# Patient Record
Sex: Male | Born: 1975 | Race: Black or African American | Hispanic: No | Marital: Single | Smoking: Never smoker
Health system: Southern US, Community
[De-identification: ages and names within clinical notes are randomized; demographics above are authoritative.]

## PROBLEM LIST (undated history)

## (undated) DIAGNOSIS — Z8711 Personal history of peptic ulcer disease: Secondary | ICD-10-CM

## (undated) DIAGNOSIS — M009 Pyogenic arthritis, unspecified: Secondary | ICD-10-CM

## (undated) DIAGNOSIS — G4730 Sleep apnea, unspecified: Secondary | ICD-10-CM

## (undated) DIAGNOSIS — Z8719 Personal history of other diseases of the digestive system: Secondary | ICD-10-CM

## (undated) DIAGNOSIS — Z9289 Personal history of other medical treatment: Secondary | ICD-10-CM

## (undated) DIAGNOSIS — F99 Mental disorder, not otherwise specified: Secondary | ICD-10-CM

## (undated) DIAGNOSIS — R0602 Shortness of breath: Secondary | ICD-10-CM

## (undated) DIAGNOSIS — F419 Anxiety disorder, unspecified: Secondary | ICD-10-CM

## (undated) DIAGNOSIS — F32A Depression, unspecified: Secondary | ICD-10-CM

## (undated) DIAGNOSIS — Z8614 Personal history of Methicillin resistant Staphylococcus aureus infection: Secondary | ICD-10-CM

## (undated) DIAGNOSIS — K5900 Constipation, unspecified: Secondary | ICD-10-CM

## (undated) DIAGNOSIS — K219 Gastro-esophageal reflux disease without esophagitis: Secondary | ICD-10-CM

## (undated) DIAGNOSIS — E669 Obesity, unspecified: Secondary | ICD-10-CM

## (undated) DIAGNOSIS — F329 Major depressive disorder, single episode, unspecified: Secondary | ICD-10-CM

## (undated) DIAGNOSIS — G629 Polyneuropathy, unspecified: Secondary | ICD-10-CM

## (undated) DIAGNOSIS — N189 Chronic kidney disease, unspecified: Secondary | ICD-10-CM

## (undated) HISTORY — PX: EYE SURGERY: SHX253

## (undated) HISTORY — PX: WISDOM TOOTH EXTRACTION: SHX21

## (undated) HISTORY — PX: TOE SURGERY: SHX1073

---

## 1998-05-15 ENCOUNTER — Encounter: Admission: RE | Admit: 1998-05-15 | Discharge: 1998-08-13 | Payer: Self-pay | Admitting: Nephrology

## 1999-06-19 HISTORY — PX: FOOT FRACTURE SURGERY: SHX645

## 2000-04-14 ENCOUNTER — Emergency Department (HOSPITAL_COMMUNITY): Admission: EM | Admit: 2000-04-14 | Discharge: 2000-04-14 | Payer: Self-pay | Admitting: Internal Medicine

## 2000-04-15 ENCOUNTER — Encounter: Payer: Self-pay | Admitting: Nephrology

## 2000-04-15 ENCOUNTER — Ambulatory Visit (HOSPITAL_COMMUNITY): Admission: RE | Admit: 2000-04-15 | Discharge: 2000-04-15 | Payer: Self-pay | Admitting: Nephrology

## 2001-09-02 ENCOUNTER — Emergency Department (HOSPITAL_COMMUNITY): Admission: EM | Admit: 2001-09-02 | Discharge: 2001-09-02 | Payer: Self-pay | Admitting: Emergency Medicine

## 2003-05-28 ENCOUNTER — Ambulatory Visit (HOSPITAL_COMMUNITY): Admission: RE | Admit: 2003-05-28 | Discharge: 2003-05-28 | Payer: Self-pay | Admitting: Nephrology

## 2003-05-28 ENCOUNTER — Encounter: Payer: Self-pay | Admitting: Nephrology

## 2003-05-31 ENCOUNTER — Ambulatory Visit (HOSPITAL_COMMUNITY): Admission: RE | Admit: 2003-05-31 | Discharge: 2003-05-31 | Payer: Self-pay | Admitting: Nephrology

## 2003-05-31 ENCOUNTER — Encounter: Payer: Self-pay | Admitting: Nephrology

## 2003-06-08 ENCOUNTER — Emergency Department (HOSPITAL_COMMUNITY): Admission: EM | Admit: 2003-06-08 | Discharge: 2003-06-08 | Payer: Self-pay | Admitting: Emergency Medicine

## 2004-10-02 ENCOUNTER — Emergency Department (HOSPITAL_COMMUNITY): Admission: EM | Admit: 2004-10-02 | Discharge: 2004-10-02 | Payer: Self-pay | Admitting: Family Medicine

## 2004-12-14 ENCOUNTER — Encounter: Admission: RE | Admit: 2004-12-14 | Discharge: 2004-12-14 | Payer: Self-pay | Admitting: Nephrology

## 2005-02-20 ENCOUNTER — Emergency Department (HOSPITAL_COMMUNITY): Admission: EM | Admit: 2005-02-20 | Discharge: 2005-02-20 | Payer: Self-pay | Admitting: Emergency Medicine

## 2005-06-15 ENCOUNTER — Emergency Department (HOSPITAL_COMMUNITY): Admission: EM | Admit: 2005-06-15 | Discharge: 2005-06-15 | Payer: Self-pay | Admitting: Family Medicine

## 2005-11-12 ENCOUNTER — Emergency Department (HOSPITAL_COMMUNITY): Admission: EM | Admit: 2005-11-12 | Discharge: 2005-11-12 | Payer: Self-pay | Admitting: Family Medicine

## 2006-01-03 ENCOUNTER — Emergency Department (HOSPITAL_COMMUNITY): Admission: EM | Admit: 2006-01-03 | Discharge: 2006-01-03 | Payer: Self-pay | Admitting: Emergency Medicine

## 2006-01-08 ENCOUNTER — Emergency Department (HOSPITAL_COMMUNITY): Admission: EM | Admit: 2006-01-08 | Discharge: 2006-01-08 | Payer: Self-pay | Admitting: Emergency Medicine

## 2006-04-01 ENCOUNTER — Emergency Department (HOSPITAL_COMMUNITY): Admission: EM | Admit: 2006-04-01 | Discharge: 2006-04-01 | Payer: Self-pay | Admitting: Family Medicine

## 2008-04-16 ENCOUNTER — Observation Stay (HOSPITAL_COMMUNITY): Admission: AD | Admit: 2008-04-16 | Discharge: 2008-04-18 | Payer: Self-pay | Admitting: Nephrology

## 2010-02-23 ENCOUNTER — Ambulatory Visit: Payer: Self-pay | Admitting: Pulmonary Disease

## 2010-02-23 DIAGNOSIS — G4730 Sleep apnea, unspecified: Secondary | ICD-10-CM | POA: Insufficient documentation

## 2010-02-23 DIAGNOSIS — Z9189 Other specified personal risk factors, not elsewhere classified: Secondary | ICD-10-CM | POA: Insufficient documentation

## 2010-02-23 DIAGNOSIS — E1129 Type 2 diabetes mellitus with other diabetic kidney complication: Secondary | ICD-10-CM

## 2010-02-23 DIAGNOSIS — J309 Allergic rhinitis, unspecified: Secondary | ICD-10-CM | POA: Insufficient documentation

## 2010-02-23 DIAGNOSIS — IMO0002 Reserved for concepts with insufficient information to code with codable children: Secondary | ICD-10-CM | POA: Insufficient documentation

## 2010-02-23 DIAGNOSIS — E1165 Type 2 diabetes mellitus with hyperglycemia: Secondary | ICD-10-CM

## 2010-03-24 ENCOUNTER — Encounter: Payer: Self-pay | Admitting: Pulmonary Disease

## 2010-03-24 ENCOUNTER — Ambulatory Visit (HOSPITAL_BASED_OUTPATIENT_CLINIC_OR_DEPARTMENT_OTHER): Admission: RE | Admit: 2010-03-24 | Discharge: 2010-03-24 | Payer: Self-pay | Admitting: Pulmonary Disease

## 2010-03-27 ENCOUNTER — Ambulatory Visit: Payer: Self-pay | Admitting: Pulmonary Disease

## 2010-04-02 ENCOUNTER — Encounter: Payer: Self-pay | Admitting: Pulmonary Disease

## 2010-05-18 ENCOUNTER — Encounter: Payer: Self-pay | Admitting: Pulmonary Disease

## 2010-07-03 ENCOUNTER — Ambulatory Visit: Payer: Self-pay | Admitting: Pulmonary Disease

## 2010-08-04 ENCOUNTER — Ambulatory Visit (HOSPITAL_BASED_OUTPATIENT_CLINIC_OR_DEPARTMENT_OTHER)
Admission: RE | Admit: 2010-08-04 | Discharge: 2010-08-04 | Payer: Self-pay | Source: Home / Self Care | Admitting: Pulmonary Disease

## 2010-08-04 ENCOUNTER — Encounter: Payer: Self-pay | Admitting: Pulmonary Disease

## 2010-08-19 ENCOUNTER — Ambulatory Visit: Payer: Self-pay | Admitting: Pulmonary Disease

## 2010-08-19 DIAGNOSIS — G4733 Obstructive sleep apnea (adult) (pediatric): Secondary | ICD-10-CM | POA: Insufficient documentation

## 2010-11-19 NOTE — Assessment & Plan Note (Signed)
Summary: rov CPAP usage//jwr   Visit Type:  Follow-up Copy to:  pcp Primary Adrian Khan/Referring Adrian Khan:  Dr. Jeri Khan  CC:  Pt states has not been able to use CPAP due to multiple reasons.  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 Epworth Sleepiness Score 1, but I suspect he is underreporting. He underwent PSG @ age 35 & was told that he had obstructive sleep apnea , did not like CPAP. He has gained a lot of weight since to 385 lbs with BMI 55. Bedtime is not fixed, 1100- 0300, sleeps on his stomach, 4-5 awakenings, wakes up sometimes close to noon with headaches.  There is no history suggestive of cataplexy, sleep paralysis or parasomnias  Mother describes loud snoring & has witnessed apneas.  July 03, 2010 5:46 PM  PSg showed moderate obstructive sleep apnea with AHI 10/h but RDI  21/h. Poor compliance with autoCPAP 5  - 12.  He c/o bloating, sleeps on his stomach, unable to turn due to hose. [place]  Preventive Screening-Counseling & Management  Alcohol-Tobacco     Smoking Status: never  Current Medications (verified): 1)  Januvia .... Take 1 Tablet By Mouth Two Times A Day  Allergies (verified): 1)  ! Sulfa  Past History:  Past Medical History: Last updated: 02/23/2010 DIABETES, TYPE 2 (ICD-250.00) SLEEP APNEA (ICD-780.57) ALLERGIC RHINITIS (ICD-477.9)    Social History: Last updated: 02/23/2010 Marital Status: Single, lives with mother, grandmother, and aunt Children: No Occupation: disabled  Patient never smoked.  ETOH-rare  Review of Systems  The patient denies anorexia, fever, weight loss, weight gain, vision loss, decreased hearing, hoarseness, chest pain, syncope, dyspnea on exertion, peripheral edema, prolonged cough, headaches, hemoptysis, abdominal pain, melena, hematochezia, severe  indigestion/heartburn, hematuria, muscle weakness, suspicious skin lesions, difficulty walking, depression, unusual weight change, abnormal bleeding, enlarged lymph nodes, and angioedema.    Vital Signs:  Patient profile:   35 year old male Height:      70 inches Weight:      362.13 pounds BMI:     52.15 O2 Sat:      99 % on Room air Temp:     98.8 degrees F oral Pulse rate:   118 / minute BP sitting:   110 / 80  (left arm) Cuff size:   large  Vitals Entered By: Adrian Khan CMA (July 03, 2010 4:36 PM)  O2 Flow:  Room air CC: Pt states has not been able to use CPAP due to multiple reasons Comments Medications reviewed with patient Verified contact number and pharmacy with patient Adrian Khan [facility]  July 03, 2010 4:36 PM    Physical Exam  Additional Exam:  wt 362 July 03, 2010 Gen. Pleasant, obese, in no distress, normal affect ENT - no lesions, no post nasal drip, class 2 airway Neck: No JVD, no thyromegaly, no carotid bruits Lungs: no use of accessory muscles, no dullness to percussion, clear without rales or rhonchi  Cardiovascular: Rhythm regular, heart sounds  normal, no murmurs or gallops, no peripheral edema Musculoskeletal: No deformities, no cyanosis or clubbing      Impression & Recommendations:  Problem # 1:  SLEEP APNEA (ICD-780.57) decrease CPAP to 8cm & rechk download CPAP titration so we can trouble shoot problems in the lab Attempt to get him a holder for the CPPA hose  - so he can turn in bed.  Orders: DME Referral (DME) Sleep Disorder Referral (Sleep Disorder) Est. Patient Level IV (82956) Prescription Created Electronically [phone])  Problem # 2:  ALLERGIC RHINITIS (ICD-477.9)  His updated medication list for this problem includes:    Zyrtec Hives Relief 10 Mg Tabs (Cetirizine hcl) ..... Once daily  Orders: Est. Patient Level IV (65784) Prescription Created Electronically [phone])  Medications Added to Medication List This  Visit: 1)  Januvia  .... Take 1 tablet by mouth two times a day 2)  Zyrtec Hives Relief 10 Mg Tabs (Cetirizine hcl) .... Once daily  Patient Instructions: 1)  Copy sent to: Dr Bascom Levels 2)  Please schedule a follow-up appointment in 3 months. 3)  CPAP titration study 4)  We will decrease pressure on CPAP machine 5)  Trial of Allergy pill  Prescriptions: ZYRTEC HIVES RELIEF 10 MG TABS (CETIRIZINE HCL) once daily  #30 x 2   Entered and Authorized by:   Adrian Locket Vassie Loll MD   Signed by:   Adrian Locket Vassie Loll MD on 07/03/2010   Method used:   Print then Give to Patient   RxID:   [id number]    Not Administered:    Pneumonia Vaccine not given due to: declined    Influenza Vaccine not given due to: declined

## 2010-11-19 NOTE — Assessment & Plan Note (Signed)
Summary: discuss cpap titration report/ms   Visit Type:  Follow-up Copy to:  pcp Primary Provider/Referring Provider:  Dr. Jeri Cos  CC:  pt here to discuss cpap titration study. pt states the other night he woke up from a nights rest and his whole body was shaking after using cpap.  History of Present Illness: the pt comes in today for f/u of his recent cpap titration study.  He has been having issues with airgulping and bloating, and the study was scheduled to help with troubleshooting.  He was changed to a medium quattro full face mask, and thought this fit much better.  His apnea was controlled from a pressure of 7 to 11cm, but he had a significant reduction in snoring on 11cm (he actually was overtitrated to 14cm).  Current Medications (verified): 1)  Januvia 100 Mg Tabs (Sitagliptin Phosphate) .... One Tablet Two Times A Day  Allergies (verified): 1)  ! Sulfa  Review of Systems       The patient complains of shortness of breath with activity, shortness of breath at rest, productive cough, nasal congestion/difficulty breathing through nose, hand/feet swelling, and joint stiffness or pain.  The patient denies non-productive cough, coughing up blood, chest pain, irregular heartbeats, acid heartburn, indigestion, loss of appetite, weight change, abdominal pain, difficulty swallowing, sore throat, tooth/dental problems, headaches, sneezing, itching, ear ache, anxiety, depression, rash, change in color of mucus, and fever.    Vital Signs:  Patient profile:   35 year old male Height:      70 inches Weight:      368 pounds BMI:     52.99 O2 Sat:      99 % on Room air Temp:     98.1 degrees F oral Pulse rate:   104 / minute BP sitting:   140 / 80  (left arm) Cuff size:   large  Vitals Entered By: Carver Fila (August 19, 2010 2:46 PM)  O2 Flow:  Room air CC: pt here to discuss cpap titration study. pt states the other night he woke up from a nights rest and his whole body  was shaking after using cpap Comments meds and allergies updated Phone number updated Carver Fila  August 19, 2010 2:47 PM    Physical Exam  General:  morbidly obese male in nad Nose:  no skin breakdown or pressure necrosis from cpap mask Extremities:  edema noted, but no cyanosis  Neurologic:  alert and oriented, does not appear overly sleepy, moves all 4.   Impression & Recommendations:  Problem # 1:  OBSTRUCTIVE SLEEP APNEA (ICD-327.23) the pt had titration study which shows an optimal pressure anywhere btw 7-11cm.  The latter controlled snoring better during the night.  Will have his current machine set on 11cm initially, but I think he has room to turn down if this is still causing airgulping and bloating.  He also did better with the mask at the sleep center, so will change to that one.  I have also encouraged him to work on weight loss.    Medications Added to Medication List This Visit: 1)  Januvia 100 Mg Tabs (Sitagliptin phosphate) .... One tablet two times a day  Other Orders: Est. Patient Level III (16109) DME Referral (DME)  Patient Instructions: 1)  will change mask to the one you used in the sleep center. 2)  will set pressure on cpap machine to fixed level of 11cm.  Please call in the next 1-2 weeks and let  us know how things are going 3)  work on weight loss 4)  will send a note to Dr. Vassie Loll and he will arrange followup with you.

## 2010-11-19 NOTE — Assessment & Plan Note (Signed)
Summary: sleep apnea/ mbw   Visit Type:  Initial Consult Copy to:  pcp Primary Provider/Referring Provider:  Dr. Jeri Cos  CC:  Pt here for sleep consult.  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 Epworth Sleepiness Score 1, but I suspect he is underreporting. He underwent PSG @ age 35 & was told that he had obstructive sleep apnea , did not like CPAP. He has gained a lot of weight since to 385 lbs with BMI 55. Bedtime is not fixed, 1100- 0300, sleeps on his stomach, 4-5 awakenings, wakes up sometimes close to noon with headaches.  There is no history suggestive of cataplexy, sleep paralysis or parasomnias  Mother describes loud snoring & has witnessed apneas.   Preventive Screening-Counseling & Management  Alcohol-Tobacco     Smoking Status: never   History of Present Illness: Headache, dizzy, lack of sleep, snoring  What time do you typically go to bed?(between what hours): 11pm to 3am  How long does it take you to fall asleep? instantly  How many times during the night do you wake up? 4 to 5  What time do you get out of bed to start your day? 11am-12noon  Do you drive or operate heavy machinery in your occupation? n/a  How much has your weight changed (up or down) over the past two years? (in pounds): down (unknown)  Have you ever had a sleep study before?  If yes,when and where: Yes  Do you currently use CPAP ? If so , at what pressure? no, [facility]) lost same  Do you wear oxygen at any time? If yes, how many liters per minute? no Allergies (verified): 1)  ! Sulfa  Past History:  Past Medical History: DIABETES, TYPE 2 (ICD-250.00) SLEEP APNEA (ICD-780.57) ALLERGIC RHINITIS (ICD-477.9)    Past Surgical History: WISDOM TEETH EXTRACTION, HX OF (ICD-V15.9)  Family History: Allergies Family History Asthma-mother Family History MI/Heart Attack-both  grandmothers Clotting disorders-mother Cancer-mother, maternal uncle and grandfather  Social History: Marital Status: Single, lives with mother, grandmother, and aunt Children: No Occupation: disabled  Patient never smoked.  ETOH-rareSmoking Status:  never  Review of Systems       The patient complains of shortness of breath with activity, acid heartburn, hand/feet swelling, and joint stiffness or pain.  The patient denies shortness of breath at rest, productive cough, non-productive cough, coughing up blood, chest pain, irregular heartbeats, indigestion, loss of appetite, weight change, abdominal pain, difficulty swallowing, sore throat, tooth/dental problems, headaches, nasal congestion/difficulty breathing through nose, sneezing, itching, ear ache, anxiety, depression, rash, change in color of mucus, and fever.    Vital Signs:  Patient profile:   35 year old male Height:      70 inches Weight:      385 pounds BMI:     55.44 O2 Sat:      97 % on Room air Temp:     98.6 degrees F oral Pulse rate:   112 / minute BP sitting:   124 / 78  (right arm) Cuff size:   large lower arm   Vitals Entered By: [name] CMA (Feb 23, 2010 2:16 PM)  O2 Flow:  Room air CC: Pt here for sleep consult Comments Medications reviewed with patient Verified contact number and pharmacy with patient [name] CMA  Feb 23, 2010 2:17 PM    Physical Exam  Additional Exam:  Gen. Pleasant, well-nourished, in  no distress, normal affect ENT - no lesions, no post nasal drip, class 2 airway Neck: No JVD, no thyromegaly, no carotid bruits Lungs: no use of accessory muscles, no dullness to percussion, clear without rales or rhonchi  Cardiovascular: Rhythm regular, heart sounds  normal, no murmurs or gallops, no peripheral edema Abdomen: soft and non-tender, no hepatosplenomegaly, BS normal. Musculoskeletal: No deformities, no cyanosis or clubbing Neuro:  alert, non focal     Impression &  Recommendations:  Problem # 1:  SLEEP APNEA (ICD-780.57) Given excessive daytime fatigue, morbid obesity & narrow pharyngeal exam, obstructive sleep apnea is very likley & a split study will be scheduled. The pathophysiology of obstructive sleep apnea, it's cardiovascular consequences and modes of treatment including CPAP were discussed with the patient in great detail.  Orders: Consultation Level III (16109) Sleep Disorder Referral (Sleep Disorder)  Patient Instructions: 1)  Copy sent to: Dr Bascom Levels 2)  Please schedule a follow-up appointment in 2 weeks after sleep study     Appended Document: Orders Update let him know moderate sleep apnea, RDI 21/h >> will start auto CPAP   Clinical Lists Changes  Orders: Added new Referral order of DME Referral (DME) - Signed      Appended Document: sleep apnea/ mbw pt informed.

## 2010-12-16 ENCOUNTER — Other Ambulatory Visit: Payer: Self-pay | Admitting: Orthopedic Surgery

## 2010-12-16 ENCOUNTER — Ambulatory Visit
Admission: RE | Admit: 2010-12-16 | Discharge: 2010-12-16 | Disposition: A | Discharge status: Home / Self Care | Payer: MEDICARE | Source: Ambulatory Visit | Attending: Orthopedic Surgery | Admitting: Orthopedic Surgery

## 2010-12-16 DIAGNOSIS — E11621 Type 2 diabetes mellitus with foot ulcer: Secondary | ICD-10-CM

## 2011-02-04 ENCOUNTER — Encounter (HOSPITAL_COMMUNITY)
Admission: RE | Admit: 2011-02-04 | Discharge: 2011-02-04 | Disposition: A | Discharge status: Home / Self Care | Payer: PRIVATE HEALTH INSURANCE | Source: Ambulatory Visit | Attending: Orthopedic Surgery | Admitting: Orthopedic Surgery

## 2011-02-04 LAB — CBC
HCT: 39.7 % (ref 39.0–52.0)
MCHC: 33.8 g/dL (ref 30.0–36.0)
Platelets: 289 10*3/uL (ref 150–400)
RDW: 12.3 % (ref 11.5–15.5)

## 2011-02-04 LAB — URINE MICROSCOPIC-ADD ON

## 2011-02-04 LAB — URINALYSIS, ROUTINE W REFLEX MICROSCOPIC
Bilirubin Urine: NEGATIVE
Ketones, ur: NEGATIVE mg/dL
Nitrite: NEGATIVE
Urobilinogen, UA: 1 mg/dL (ref 0.0–1.0)

## 2011-02-04 LAB — SURGICAL PCR SCREEN
MRSA, PCR: NEGATIVE
Staphylococcus aureus: NEGATIVE

## 2011-02-04 LAB — BASIC METABOLIC PANEL
BUN: 6 mg/dL (ref 6–23)
Calcium: 8.8 mg/dL (ref 8.4–10.5)
GFR calc non Af Amer: >60 mL/min (ref >60)
Glucose, Bld: 149 mg/dL — ABNORMAL HIGH (ref 70–99)
Sodium: 139 mEq/L (ref 135–145)

## 2011-02-05 ENCOUNTER — Observation Stay (HOSPITAL_COMMUNITY)
Admission: RE | Admit: 2011-02-05 | Discharge: 2011-02-06 | Disposition: A | Discharge status: Home / Self Care | Payer: PRIVATE HEALTH INSURANCE | Source: Ambulatory Visit | Attending: Orthopedic Surgery | Admitting: Orthopedic Surgery

## 2011-02-05 ENCOUNTER — Other Ambulatory Visit: Payer: Self-pay | Admitting: Orthopedic Surgery

## 2011-02-05 DIAGNOSIS — G4733 Obstructive sleep apnea (adult) (pediatric): Secondary | ICD-10-CM | POA: Insufficient documentation

## 2011-02-05 DIAGNOSIS — I96 Gangrene, not elsewhere classified: Secondary | ICD-10-CM | POA: Insufficient documentation

## 2011-02-05 DIAGNOSIS — L97509 Non-pressure chronic ulcer of other part of unspecified foot with unspecified severity: Secondary | ICD-10-CM | POA: Insufficient documentation

## 2011-02-05 DIAGNOSIS — E1159 Type 2 diabetes mellitus with other circulatory complications: Principal | ICD-10-CM | POA: Insufficient documentation

## 2011-02-05 LAB — GLUCOSE, CAPILLARY
Glucose-Capillary: 212 mg/dL — ABNORMAL HIGH (ref 70–99)
Glucose-Capillary: 242 mg/dL — ABNORMAL HIGH (ref 70–99)
Glucose-Capillary: 141 mg/dL — ABNORMAL HIGH (ref 70–99)

## 2011-02-06 LAB — GLUCOSE, CAPILLARY

## 2011-02-08 LAB — GLUCOSE, CAPILLARY: Glucose-Capillary: 141 mg/dL — ABNORMAL HIGH (ref 70–99)

## 2011-02-10 LAB — ANAEROBIC CULTURE: Gram Stain: NONE SEEN

## 2011-02-11 LAB — TISSUE CULTURE: Gram Stain: NONE SEEN

## 2011-03-02 NOTE — Consult Note (Signed)
NAMEAXLE, PARFAIT NO.:  [id number]   MEDICAL RECORD NO.:  [id number]          PATIENT TYPE:  INP   LOCATION:  6737                         FACILITY:  [facility]   PHYSICIAN:  Antonietta Breach, M.D.  DATE OF BIRTH:  08-24-1976   DATE OF CONSULTATION:  04/17/2008  DATE OF DISCHARGE:                                 CONSULTATION   REQUESTING PHYSICIAN:  Jarome Matin, M.D.   REASON FOR CONSULTATION:  Anxiety.   HISTORY OF PRESENT ILLNESS:  Mr. Forero is a 35 year old male admitted  to the [facility] on April 16, 2008, with an infected foot.  The  patient's foot requires further evaluation and treatment and likely  surgery.  However, the patient has a severe fear of needles and will not  allow further evaluation and treatment which does require venipuncture.   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  He has tried to agree to  such procedures involving venipuncture and has tried to work himself up  to a state of tolerance.  However, he gets extremely nauseated and has  severe feeling on edge and cannot go through with a venipuncture.   The patient has normal intelligence.  He is socially appropriate and  cooperative otherwise.  He has no hallucinations or delusions.  His  memory function is normal.  He is oriented to all spheres.  He is  cooperative and appropriate with all other forms of bedside care.   PAST PSYCHIATRIC HISTORY:  There is no history of mania, hallucinations,  delusions, depression.  It is noted that the patient did have a history  of impacted mandibular third molars at that time.  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  He eventually agreed  to have oral surgery which did involve nitrous oxide along with  intravenous tranquilization.  This operation was successful.  It was  done by a Counselling psychologist.   FAMILY PSYCHIATRIC HISTORY:  None known.   SOCIAL HISTORY:  The patient graduated from  high school.  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  He  lives with his mother.  He does not do any alcohol or illegal drugs.   PAST MEDICAL HISTORY:  Diabetes mellitus, obesity.   MEDICATIONS:  The MAR is reviewed.  The patient is on Glucophage 500 mg  b.i.d.   ALLERGIES:  SULFA.   REVIEW OF SYSTEMS:  HEENT, mouth, neurologic, psychiatric  cardiovascular, respiratory, gastrointestinal, genitourinary, skin,  musculoskeletal, hematologic lymphatic, endocrine and metabolic all  unremarkable.   PHYSICAL EXAMINATION:  VITAL SIGNS:  Temperature 98.1, pulse 88,  respiratory rate 22, blood pressure 142/84, O2 saturation on room air  96%.  MENTAL STATUS EXAM:  Mr. Gangemi is alert.  He requested that his  mother be in the room to help with history.  He has good eye contact.  His attention span is normal.  He is oriented to all spheres.  His  concentration is within normal limits.  His memory is intact to  immediate, recent and remote.  His fund of knowledge  and intelligence  are within normal limits.  His speech involves normal rate and prosody.  Thought process is logical, coherent, goal-directed.  Thought content -  no thoughts of harming himself.  No thoughts of harming others.  No  delusions, no hallucinations.  Affect is slightly anxious.  Mood is  slightly anxious when discussing the possibility of venipuncture and the  patient has intact insight for his anxiety.  His judgment is intact.   ASSESSMENT:  AXIS I:  293.84, anxiety disorder not otherwise specified.  The patient may have other general anxiety symptoms.  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  AXIS III:  See past medical history.  AXIS IV:  General medical.  AXIS V:  55.   Mr. Edmister agrees to call emergency services immediately for any  psychiatric emergency symptoms.   The undersigned provided ego supportive psychotherapy and education.   The  indications, alternatives and adverse effects of Ativan and other  benzodiazepines were discussed with the patient and his mother including  the risk of dependence and sedation.  They understand and would like to  proceed with the use of benzodiazepines as well as any other anesthetic  agent that the surgeon would approve of in order to evaluate and treat  the patient according to the standard of care.   RECOMMENDATIONS:  1. For acute antianxiety including preparing the patient for      intervention would utilize Ativan 1-2 mg p.o. q.4 h p.r.n.  2. Regarding treatment of the patient during venipuncture and another      procedures Versed could be utilized for rapid induction of      tranquilization with rapid cessation of tranquilization when a      procedure was over.   Regarding this patient's long term antianxiety care psychotherapy  involving behavioral therapy can be very effective.  Would ask the  social worker once the patient is ready for discharge to set the patient  up with psychotherapy with a psychologist trained and experienced in  treating phobias.  Options for finding a psychologist could be the  [facility] and other PhD's in psychology  in town that are available.      Antonietta Breach, M.D.  Electronically Signed     JW/MEDQ  D:  04/17/2008  T:  04/17/2008  Job:  [id number]

## 2011-03-02 NOTE — Consult Note (Signed)
Adrian, Khan NO.:  [id number]   MEDICAL RECORD NO.:  [id number]          PATIENT TYPE:  OBV   LOCATION:  6737                         FACILITY:  [facility]   PHYSICIAN:  Myrtie Neither, MD      DATE OF BIRTH:  [birth year]   DATE OF CONSULTATION:  DATE OF DISCHARGE:                                 CONSULTATION   REFERRING PHYSICIAN:  Jarome Matin, MD   REASON FOR CONSULTATION:  Diabetic right foot with pressure ulceration.   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  Currently, this has been going on  over the past several weeks.  The patient has been presently  hospitalized and treated for the ulceration and possible infection as  well as undergoing psychiatric evaluation.  The patient states that he  developed these in a similar situation, but not as bad underneath his  left foot from the shoes that he has been wearing.   PAST MEDICAL HISTORY:  Diabetes mellitus, exorbitant obesity, anxiety  behavior.   ALLERGIES:  None known.   MEDICATIONS:  Glucophage 500 mg b.i.d.   SOCIAL HISTORY:  Negative.   FAMILY HISTORY:  Obesity and diabetes mellitus.   REVIEW OF SYSTEMS:  No cardiac or respiratory, no urinary or bowel  symptoms.  The patient is having anxiety and phobia to needles.  The  patient has been seen by a podiatrist and referred for [facility]  with recommendation of needle probes.  The patient has phobia of needles  and is very resistant to this.   PHYSICAL EXAMINATION:  VITAL SIGNS:  Temperature 99.4, pulse 106,  respirations 20, and blood pressure 102/61.  GENERAL:  Alert and oriented, in no acute distress.  A rather congenial  young fellow.  EXTREMITIES:  Right foot thickened callosity approximately size of a 25  cent piece on the plantar aspect of the fifth metatarsal head, no  drainage,  some tenderness to palpation.  No increased warmth currently  to the area.  The patient has further callosity underneath the plantar  aspect of the left foot of the first and fifth metatarsal heads.   X-rays of the right foot does not show any bony involvement, and no  evidence of osteomyelitis.   IMPRESSION:  Diabetic right foot with pressure necrosis involving the  fifth metatarsal head, possible cellulitis, exorbitant obesity, anxiety  behavior, and uncontrolled diabetes mellitus.   RECOMMENDATIONS:  Adrian Khan  shoe for the right foot.  Wound care consult,  possible use of DuoDerm treatment to the area, continue present IV  antibiotics for 24 to 48 hours.  Home health nurse will continue to  wound care, return to the office in 1 week.      Myrtie Neither, MD  Electronically Signed     AC/MEDQ  D:  04/17/2008  T:  04/18/2008  Job:  [id number]

## 2011-03-04 NOTE — Op Note (Signed)
  NAMENELLO, CORRO NO.:  [id number]  MEDICAL RECORD NO.:  [id number]           PATIENT TYPE:  O  LOCATION:  5011                         FACILITY:  [facility]  PHYSICIAN:  Myrtie Neither, MD      DATE OF BIRTH:  02-17-1976  DATE OF PROCEDURE:  02/05/2011 DATE OF DISCHARGE:                              OPERATIVE REPORT   PREOPERATIVE DIAGNOSIS:  Gangrenous right second toe.  POSTOPERATIVE DIAGNOSIS:  Gangrenous right second toe.  COMPLICATIONS:  None.  INFECTIONS:  None.  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  DESCRIPTION OF PROCEDURE:  Black eschared area of the second toe granulation tissue about the border.  No purulent drainage noted. Resection and debridement with resection of the eschar and resection of the bone segments were done including the proximal phalanx of the second toe.  Edges were freshened up and skin closure was possible with the use of 4-0 nylon.  Compressive dressing was applied.  The patient tolerated procedure quite well, taken to recovery room in stable and satisfactory condition.     Myrtie Neither, MD     AC/MEDQ  D:  02/05/2011  T:  02/05/2011  Job:  [id number]  Electronically Signed by Myrtie Neither MD on 03/04/2011 03:22:51 PM

## 2011-03-04 NOTE — H&P (Signed)
  NAMEORI, TREJOS NO.:  [id number]  MEDICAL RECORD NO.:  [id number]           PATIENT TYPE:  O  LOCATION:  5011                         FACILITY:  [facility]  PHYSICIAN:  Myrtie Neither, MD      DATE OF BIRTH:  1976/09/22  DATE OF ADMISSION:  02/05/2011 DATE OF DISCHARGE:                             HISTORY & PHYSICAL   CHIEF COMPLAINT:  Gangrene, right second toe.  HISTORY OF PRESENT ILLNESS:  This is a 35 year old male, with unknown diabetic, morbidly obese individual, who has been treated for the past month for initially infected blistered wounds involving both feet.  The patient does give a history of fall and injury to the right foot, but this does not explain both feet being involved, but the patient was treated for open wound blistered areas, dorsal aspect, and all toes in the right foot, and the left third and fourth toe on the left foot.  X- ray did revealed possible fracture of the right fourth metatarsal.  The patient was treated on an outpatient oral antibiotics, elevation, wet to dry dressing.  Wound of the left foot, those are third and fourth toes, healed and well.  The right foot skin is closing dorsally of the right foot.  The toes also healing.  The second toe became gangrenous.  The patient has been treated with oral antibiotics and home health treatment to both feet.  PAST MEDICAL HISTORY:  Long history of diabetes, uncontrolled.  The patient gives history of not being seen by medical doctor over a year and without any blood sugar checks.  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  Past medical history of morbid obesity, diabetes mellitus, high blood pressure.  History of sleep apnea, also hypothyroidism, pituitary dysfunction, anxiety.  ALLERGIES:  SULFA.  SOCIAL HISTORY:  Occasional use of alcohol.  Denies use of tobacco or recreational drugs.  FAMILY HISTORY:  Obesity and diabetes  mellitus.  MEDICATIONS:  Diazepam and Januvia.  PHYSICAL EXAMINATION:  VITAL SIGNS:  Height 7 feet 1 inch, weight 174.5 kg, temperature 97.8, pulse 96, respirations 20, O2 saturation 99%, blood pressure 149/88. HEAD:  Normocephalic. EYES:  Conjunctivae clear. NECK:  Supple. CHEST:  Clear. CARDIAC:  S1 and S2 regular. EXTREMITIES:  Right foot healing granulating wound, dorsal aspect of the right foot dry, gangrenous second toe with red borders and with granulation tissue, healing areas of third, fourth and fifth toes.  The left foot with some scar.  Plantar aspect of the fourth toe from previous open wound, but this is dry and healed.  IMPRESSION:  Diabetic gangrenous right second toe, morbid obesity.  PLAN:  Debridement of right foot and possible second toe amputation.     Myrtie Neither, MD     AC/MEDQ  D:  02/05/2011  T:  02/05/2011  Job:  [id number]  Electronically Signed by Myrtie Neither MD on 03/04/2011 03:22:46 PM

## 2011-03-04 NOTE — Discharge Summary (Signed)
  NAMEMARKES, SHATSWELL NO.:  [id number]  MEDICAL RECORD NO.:  [id number]           PATIENT TYPE:  O  LOCATION:  5011                         FACILITY:  [facility]  PHYSICIAN:  Myrtie Neither, MD      DATE OF BIRTH:  13-Jun-1976  DATE OF ADMISSION:  02/05/2011 DATE OF DISCHARGE:  02/06/2011                              DISCHARGE SUMMARY   ADMITTING DIAGNOSES: 1. Gangrenous diabetic right second toe. 2. Diabetes mellitus. 3. Morbid obesity.  DISCHARGE DIAGNOSES: 1. Gangrenous diabetic right second toe. 2. Diabetes mellitus. 3. Morbid obesity.  COMPLICATIONS:  None.  INFECTIONS:  None.  OPERATION:  Right second toe amputation.  PERTINENT HISTORY:  This 35 year old male, poorly controlled diabetic, morbid obese, with gangrenous right second toe, had been treated for the wounds involving both feet over the past several months and he has developed gangrenous changes of the right second toe.  Pertinent physical was that of the right foot healing wounds on the dorsal aspect of the foot with skin filling in and good healthy appearing granulation tissue.  Second toe was gangrenous with black discoloration with reddened border.  HOSPITAL COURSE:  The patient underwent preop labs, CBC, EKG, chest x- ray, CMET, UA, CBC, and the patient's labs were found to be stable enough to undergo surgery.  The patient underwent right second toe amputation, postoperatively placed on IV antibiotics, nonweightbearing on the right side, Darco shoe for the right side.  Afebrile.  Dressings dry.  Pain is under control.  The patient wishes to be discharged and feels that he is steady enough with his walker.  We will discharge on Percocet one to two q.6 h. p.r.n. pain, Keflex 500 mg q.6 h. p.o. Return to the office this coming Friday.  The patient is being discharged in stable and satisfactory condition.     Myrtie Neither, MD    AC/MEDQ  D:  02/06/2011  T:  02/07/2011  Job:   [id number]  Electronically Signed by Myrtie Neither MD on 03/04/2011 03:22:42 PM

## 2011-03-05 NOTE — Discharge Summary (Signed)
NAMECHANCY, SMIGIEL NO.:  [id number]   MEDICAL RECORD NO.:  [id number]          PATIENT TYPE:  OBV   LOCATION:  6737                         FACILITY:  [facility]   PHYSICIAN:  Jarome Matin, M.D.DATE OF BIRTH:  06/22/1976   DATE OF ADMISSION:  04/16/2008  DATE OF DISCHARGE:  04/18/2008                               DISCHARGE SUMMARY   BRIEF HISTORY AND HOSPITAL COURSE:  This is a morbidly obese 35 year old  African-American male, who is a diabetic and developed a draining ulcer  on the bottom of his foot about the size of a small toe.  The patient is  hypertensive, morbidly obese, had diabetic ulcer, had a persistently  poor control of his diabetes and a morbid fear of being struck by a  needle.  He would not allow blood to be drawn.  His sugar when tested  was over 400.   PHYSICAL EXAMINATION:  VITAL SIGNS:  Reveals a temp of 99.4, pulse of  106, respirations 20, blood pressure 122/61, and his weight was about  383.9 pounds.  HEENT:  He has fair dentition.  CHEST:  Clear to auscultation and percussion.  He had a sinus  tachycardia.  ABDOMEN:  Morbidly obese.  Active bowel sounds.  EXTREMITIES:  He can move all of his extremities.  On the bottom of his  right foot under the small toe, a bunion lesion that appears infected,  needs some debridement and also some education for his diabetes and  diabetic fall.   He had an orthopedic consult and also a psych consult.   PERTINENT LABS:  Except for a blood sugar of over 400.  I am unable to  get any labs because he kept refusing the dispo in the hospital.  Dr.  Jeanie Sewer, psychiatrist saw the patient.  He recommended for acute anti-  anxiety and preparing the patient for intervention that we utilize  Ativan 1-2 mg q.4-6h. and regarding the treatment of the patient during  the carrying out of other procedures, Versed could be utilized for rapid  induction of tranquilization with rapid cessation of  tranquilization  when the procedure was over.   Regarding his long-term anxiety, psychotherapy with behavioral therapy  could be effective.  Once the patient was ready for discharge, we had  the social work to set the patient up to psychotherapy and a  psychologist experienced with treating phobias.  Recommended  psychiatrist of [facility] [employer] [facility], other PACs in  psychology in town that would be available.  We did get back to see  orthopedic doctor and he recommended Darco shoes and a nurse consult.  I  am going to see him as an outpatient.  Wound care nurse saw him and  treated the wound.  However, the patient said he wanted to leave the  hospital.  He signed out AMA.           ______________________________  Jarome Matin, M.D.     CEF/MEDQ  D:  05/11/2008  T:  05/12/2008  Job:  [id number]

## 2011-07-15 LAB — URINE CULTURE: Colony Count: >=100000

## 2011-07-15 LAB — COMPREHENSIVE METABOLIC PANEL
AST: 16
Albumin: 2.6 — ABNORMAL LOW
Alkaline Phosphatase: 70
Chloride: 99
GFR calc Af Amer: >60
Potassium: 3.8
Sodium: 135
Total Bilirubin: 0.9

## 2011-07-15 LAB — URINE MICROSCOPIC-ADD ON

## 2011-07-15 LAB — DIFFERENTIAL
Basophils Absolute: 0.1
Basophils Relative: 0
Eosinophils Relative: 1
Monocytes Absolute: 0.6
Monocytes Relative: 5

## 2011-07-15 LAB — URINALYSIS, ROUTINE W REFLEX MICROSCOPIC
Glucose, UA: >1000 — AB
Hgb urine dipstick: NEGATIVE
Protein, ur: 30 — AB

## 2011-07-15 LAB — CBC
HCT: 40.7
Platelets: 238
WBC: 11.6 — ABNORMAL HIGH

## 2011-07-15 LAB — WOUND CULTURE

## 2011-12-06 ENCOUNTER — Other Ambulatory Visit: Payer: Self-pay | Admitting: Orthopedic Surgery

## 2011-12-06 ENCOUNTER — Ambulatory Visit
Admission: RE | Admit: 2011-12-06 | Discharge: 2011-12-06 | Disposition: A | Discharge status: Home / Self Care | Payer: PRIVATE HEALTH INSURANCE | Source: Ambulatory Visit | Attending: Orthopedic Surgery | Admitting: Orthopedic Surgery

## 2011-12-06 DIAGNOSIS — T148XXA Other injury of unspecified body region, initial encounter: Secondary | ICD-10-CM

## 2011-12-21 ENCOUNTER — Emergency Department (HOSPITAL_COMMUNITY)
Admission: EM | Admit: 2011-12-21 | Discharge: 2011-12-21 | Disposition: A | Discharge status: Other Acute Inpatient Hospital | Payer: PRIVATE HEALTH INSURANCE

## 2011-12-21 ENCOUNTER — Inpatient Hospital Stay (HOSPITAL_COMMUNITY): Payer: PRIVATE HEALTH INSURANCE

## 2011-12-21 ENCOUNTER — Other Ambulatory Visit: Payer: Self-pay | Admitting: Orthopedic Surgery

## 2011-12-21 ENCOUNTER — Inpatient Hospital Stay (HOSPITAL_COMMUNITY)
Admission: AD | Admit: 2011-12-21 | Discharge: 2012-01-05 | DRG: 853 | Disposition: A | Discharge status: Home Health | Payer: PRIVATE HEALTH INSURANCE | Source: Ambulatory Visit | Attending: Orthopedic Surgery | Admitting: Orthopedic Surgery

## 2011-12-21 DIAGNOSIS — D473 Essential (hemorrhagic) thrombocythemia: Secondary | ICD-10-CM | POA: Diagnosis present

## 2011-12-21 DIAGNOSIS — E1129 Type 2 diabetes mellitus with other diabetic kidney complication: Secondary | ICD-10-CM | POA: Diagnosis present

## 2011-12-21 DIAGNOSIS — E871 Hypo-osmolality and hyponatremia: Secondary | ICD-10-CM | POA: Diagnosis present

## 2011-12-21 DIAGNOSIS — L02612 Cutaneous abscess of left foot: Secondary | ICD-10-CM

## 2011-12-21 DIAGNOSIS — IMO0002 Reserved for concepts with insufficient information to code with codable children: Secondary | ICD-10-CM | POA: Diagnosis present

## 2011-12-21 DIAGNOSIS — E869 Volume depletion, unspecified: Secondary | ICD-10-CM | POA: Diagnosis present

## 2011-12-21 DIAGNOSIS — D72829 Elevated white blood cell count, unspecified: Secondary | ICD-10-CM | POA: Diagnosis present

## 2011-12-21 DIAGNOSIS — G4730 Sleep apnea, unspecified: Secondary | ICD-10-CM

## 2011-12-21 DIAGNOSIS — I10 Essential (primary) hypertension: Secondary | ICD-10-CM | POA: Diagnosis present

## 2011-12-21 DIAGNOSIS — B961 Klebsiella pneumoniae [K. pneumoniae] as the cause of diseases classified elsewhere: Secondary | ICD-10-CM | POA: Diagnosis present

## 2011-12-21 DIAGNOSIS — F411 Generalized anxiety disorder: Secondary | ICD-10-CM | POA: Diagnosis present

## 2011-12-21 DIAGNOSIS — D631 Anemia in chronic kidney disease: Secondary | ICD-10-CM | POA: Diagnosis present

## 2011-12-21 DIAGNOSIS — L97509 Non-pressure chronic ulcer of other part of unspecified foot with unspecified severity: Secondary | ICD-10-CM | POA: Diagnosis present

## 2011-12-21 DIAGNOSIS — R6510 Systemic inflammatory response syndrome (SIRS) of non-infectious origin without acute organ dysfunction: Secondary | ICD-10-CM | POA: Diagnosis present

## 2011-12-21 DIAGNOSIS — G4733 Obstructive sleep apnea (adult) (pediatric): Secondary | ICD-10-CM | POA: Diagnosis present

## 2011-12-21 DIAGNOSIS — Z6841 Body Mass Index (BMI) 40.0 and over, adult: Secondary | ICD-10-CM

## 2011-12-21 DIAGNOSIS — L03119 Cellulitis of unspecified part of limb: Secondary | ICD-10-CM | POA: Diagnosis present

## 2011-12-21 DIAGNOSIS — E119 Type 2 diabetes mellitus without complications: Secondary | ICD-10-CM

## 2011-12-21 DIAGNOSIS — J309 Allergic rhinitis, unspecified: Secondary | ICD-10-CM | POA: Diagnosis present

## 2011-12-21 DIAGNOSIS — R509 Fever, unspecified: Secondary | ICD-10-CM | POA: Diagnosis present

## 2011-12-21 DIAGNOSIS — A419 Sepsis, unspecified organism: Principal | ICD-10-CM | POA: Diagnosis present

## 2011-12-21 DIAGNOSIS — K5900 Constipation, unspecified: Secondary | ICD-10-CM | POA: Diagnosis not present

## 2011-12-21 DIAGNOSIS — L02619 Cutaneous abscess of unspecified foot: Secondary | ICD-10-CM | POA: Diagnosis present

## 2011-12-21 DIAGNOSIS — D638 Anemia in other chronic diseases classified elsewhere: Secondary | ICD-10-CM | POA: Diagnosis present

## 2011-12-21 DIAGNOSIS — Z9189 Other specified personal risk factors, not elsewhere classified: Secondary | ICD-10-CM

## 2011-12-21 DIAGNOSIS — K219 Gastro-esophageal reflux disease without esophagitis: Secondary | ICD-10-CM | POA: Diagnosis present

## 2011-12-21 DIAGNOSIS — D75839 Thrombocytosis, unspecified: Secondary | ICD-10-CM | POA: Diagnosis present

## 2011-12-21 DIAGNOSIS — N189 Chronic kidney disease, unspecified: Secondary | ICD-10-CM | POA: Diagnosis present

## 2011-12-21 DIAGNOSIS — J9620 Acute and chronic respiratory failure, unspecified whether with hypoxia or hypercapnia: Secondary | ICD-10-CM | POA: Diagnosis not present

## 2011-12-21 DIAGNOSIS — J9621 Acute and chronic respiratory failure with hypoxia: Secondary | ICD-10-CM | POA: Diagnosis present

## 2011-12-21 DIAGNOSIS — E1165 Type 2 diabetes mellitus with hyperglycemia: Secondary | ICD-10-CM | POA: Diagnosis present

## 2011-12-21 LAB — URINALYSIS, ROUTINE W REFLEX MICROSCOPIC
Glucose, UA: 250 mg/dL — AB
Ketones, ur: 15 mg/dL — AB
Protein, ur: >300 mg/dL — AB

## 2011-12-21 LAB — URINE MICROSCOPIC-ADD ON

## 2011-12-21 LAB — GLUCOSE, CAPILLARY: Glucose-Capillary: 265 mg/dL — ABNORMAL HIGH (ref 70–99)

## 2011-12-21 MED ORDER — OXYCODONE-ACETAMINOPHEN 7.5-325 MG PO TABS: 1.0000 | ORAL_TABLET | ORAL | Status: DC | PRN

## 2011-12-21 MED ORDER — ACETAMINOPHEN 325 MG PO TABS
650.0000 mg | ORAL_TABLET | ORAL | Status: DC | PRN
  Administered 2011-12-21: 650 mg via ORAL
  Filled 2011-12-21: qty 2

## 2011-12-21 MED ORDER — INSULIN ASPART 100 UNIT/ML ~~LOC~~ SOLN
0.0000 [IU] | SUBCUTANEOUS | Status: DC
  Administered 2011-12-22 (×2): 11 [IU] via SUBCUTANEOUS
  Administered 2011-12-22: 7 [IU] via SUBCUTANEOUS
  Filled 2011-12-21 (×3): qty 3

## 2011-12-21 MED ORDER — SODIUM CHLORIDE 0.45 % IV SOLN
INTRAVENOUS | Status: DC
  Administered 2011-12-21 – 2011-12-22 (×2): via INTRAVENOUS

## 2011-12-21 MED ORDER — CHLORHEXIDINE GLUCONATE 4 % EX LIQD
60.0000 mL | Freq: Once | CUTANEOUS | Status: DC
  Filled 2011-12-21: qty 60

## 2011-12-21 NOTE — Progress Notes (Signed)
Patient arrived to unit at 2000. Dr. Myrtie Neither is attending and notified of admission and patient's current v/s which is not stable at this time. Will come and asses patient.

## 2011-12-21 NOTE — H&P (Signed)
  THIS IS A 35Y/O MALE KNOWN MORBID OBESE DIABETIC WITH DRAINING ABSCESS OF THE LEFT FOOT. PMH: DIABETES MELLITUS, MORBID OBESITY, SLEEP APNEA, HYPERTENTION. ALLERGY:SULFUR ROS: NO CARDIAC OR URINARY SYMPTOMS HABITS:NONE P.E. MORBID OBESITY 174 KG, ALERT AND ORIENTED, NO ACUTE DISTRESS. LEFT FOOT DIFFUSELY SWOLLEN, PURULENT  MATERIAL DRAINING FROM THE PLANTAR ASPECT OF THE FOOT. X-R :DIFFUSE SWELLING NO EVIDENCE OF OSTEOMYELITIS. IMPRESSION:ABSCESS,LEFT FOOT.PLAN:INCISION LEFT FOOT.

## 2011-12-22 ENCOUNTER — Encounter (HOSPITAL_COMMUNITY): Payer: Self-pay | Admitting: Anesthesiology

## 2011-12-22 ENCOUNTER — Encounter (HOSPITAL_COMMUNITY)
Admission: AD | Disposition: A | Discharge status: Home Health | Payer: Self-pay | Source: Ambulatory Visit | Attending: Orthopedic Surgery

## 2011-12-22 ENCOUNTER — Encounter (HOSPITAL_COMMUNITY): Payer: Self-pay | Admitting: *Deleted

## 2011-12-22 ENCOUNTER — Inpatient Hospital Stay (HOSPITAL_COMMUNITY): Payer: PRIVATE HEALTH INSURANCE

## 2011-12-22 ENCOUNTER — Inpatient Hospital Stay (HOSPITAL_COMMUNITY): Payer: PRIVATE HEALTH INSURANCE | Admitting: Anesthesiology

## 2011-12-22 DIAGNOSIS — E871 Hypo-osmolality and hyponatremia: Secondary | ICD-10-CM | POA: Diagnosis present

## 2011-12-22 DIAGNOSIS — L02619 Cutaneous abscess of unspecified foot: Secondary | ICD-10-CM | POA: Diagnosis present

## 2011-12-22 DIAGNOSIS — A419 Sepsis, unspecified organism: Secondary | ICD-10-CM | POA: Diagnosis present

## 2011-12-22 DIAGNOSIS — D473 Essential (hemorrhagic) thrombocythemia: Secondary | ICD-10-CM | POA: Diagnosis present

## 2011-12-22 DIAGNOSIS — R509 Fever, unspecified: Secondary | ICD-10-CM | POA: Diagnosis present

## 2011-12-22 DIAGNOSIS — D72829 Elevated white blood cell count, unspecified: Secondary | ICD-10-CM | POA: Diagnosis present

## 2011-12-22 DIAGNOSIS — D631 Anemia in chronic kidney disease: Secondary | ICD-10-CM | POA: Diagnosis present

## 2011-12-22 HISTORY — PX: I&D EXTREMITY: SHX5045

## 2011-12-22 LAB — GLUCOSE, CAPILLARY
Glucose-Capillary: 229 mg/dL — ABNORMAL HIGH (ref 70–99)
Glucose-Capillary: 265 mg/dL — ABNORMAL HIGH (ref 70–99)

## 2011-12-22 LAB — HEMOGLOBIN A1C: Mean Plasma Glucose: 318 mg/dL — ABNORMAL HIGH (ref <117)

## 2011-12-22 LAB — COMPREHENSIVE METABOLIC PANEL
AST: 15 U/L (ref 0–37)
Albumin: 1.5 g/dL — ABNORMAL LOW (ref 3.5–5.2)
Alkaline Phosphatase: 146 U/L — ABNORMAL HIGH (ref 39–117)
Chloride: 93 mEq/L — ABNORMAL LOW (ref 96–112)
Potassium: 3.6 mEq/L (ref 3.5–5.1)
Sodium: 128 mEq/L — ABNORMAL LOW (ref 135–145)
Total Bilirubin: 0.2 mg/dL — ABNORMAL LOW (ref 0.3–1.2)

## 2011-12-22 LAB — CBC
Platelets: 538 10*3/uL — ABNORMAL HIGH (ref 150–400)
RDW: 12.7 % (ref 11.5–15.5)
WBC: 34.9 10*3/uL — ABNORMAL HIGH (ref 4.0–10.5)

## 2011-12-22 LAB — PROTIME-INR: INR: 1.3 (ref 0.00–1.49)

## 2011-12-22 LAB — BLOOD GAS, ARTERIAL
Acid-base deficit: 0.1 mmol/L (ref 0.0–2.0)
Drawn by: 257081
O2 Content: 2 L/min
O2 Saturation: 95.5 %
pO2, Arterial: 73 mmHg — ABNORMAL LOW (ref 80.0–100.0)

## 2011-12-22 LAB — SURGICAL PCR SCREEN: MRSA, PCR: NEGATIVE

## 2011-12-22 LAB — OSMOLALITY: Osmolality: 283 mOsm/kg (ref 275–300)

## 2011-12-22 SURGERY — IRRIGATION AND DEBRIDEMENT EXTREMITY
Anesthesia: General | Site: Foot | Laterality: Left | Wound class: Dirty or Infected

## 2011-12-22 MED ORDER — MIDAZOLAM HCL 5 MG/5ML IJ SOLN
INTRAMUSCULAR | Status: DC | PRN
  Administered 2011-12-22: 2 mg via INTRAVENOUS

## 2011-12-22 MED ORDER — HYDROMORPHONE 0.3 MG/ML IV SOLN
INTRAVENOUS | Status: AC
  Filled 2011-12-22: qty 25

## 2011-12-22 MED ORDER — DIPHENHYDRAMINE HCL 12.5 MG/5ML PO ELIX: 12.5000 mg | ORAL_SOLUTION | Freq: Four times a day (QID) | ORAL | Status: DC | PRN

## 2011-12-22 MED ORDER — WARFARIN VIDEO: Freq: Once | Status: DC

## 2011-12-22 MED ORDER — ACETAMINOPHEN 325 MG PO TABS
650.0000 mg | ORAL_TABLET | Freq: Four times a day (QID) | ORAL | Status: DC | PRN
  Administered 2012-01-02: 650 mg via ORAL
  Filled 2011-12-22: qty 2

## 2011-12-22 MED ORDER — FENTANYL CITRATE 0.05 MG/ML IJ SOLN
INTRAMUSCULAR | Status: DC | PRN
  Administered 2011-12-22 (×3): 50 ug via INTRAVENOUS

## 2011-12-22 MED ORDER — PHENYLEPHRINE HCL 10 MG/ML IJ SOLN
INTRAMUSCULAR | Status: DC | PRN
  Administered 2011-12-22 (×4): 40 ug via INTRAVENOUS

## 2011-12-22 MED ORDER — PHENOL 1.4 % MT LIQD
1.0000 | OROMUCOSAL | Status: DC | PRN
  Administered 2011-12-24: 1 via OROMUCOSAL
  Filled 2011-12-22: qty 177

## 2011-12-22 MED ORDER — PROPOFOL 10 MG/ML IV EMUL
INTRAVENOUS | Status: DC | PRN
  Administered 2011-12-22: 200 mg via INTRAVENOUS

## 2011-12-22 MED ORDER — SODIUM CHLORIDE 0.9 % IV SOLN: INTRAVENOUS | Status: DC

## 2011-12-22 MED ORDER — COUMADIN BOOK
Freq: Once | Status: AC
  Administered 2011-12-22: 13:00:00
  Filled 2011-12-22: qty 1

## 2011-12-22 MED ORDER — NALOXONE HCL 0.4 MG/ML IJ SOLN: 0.4000 mg | INTRAMUSCULAR | Status: DC | PRN

## 2011-12-22 MED ORDER — HYDROMORPHONE HCL PF 1 MG/ML IJ SOLN: 0.2500 mg | INTRAMUSCULAR | Status: DC | PRN

## 2011-12-22 MED ORDER — ONDANSETRON HCL 4 MG/2ML IJ SOLN
INTRAMUSCULAR | Status: DC | PRN
  Administered 2011-12-22: 4 mg via INTRAVENOUS

## 2011-12-22 MED ORDER — SUCCINYLCHOLINE CHLORIDE 20 MG/ML IJ SOLN
INTRAMUSCULAR | Status: DC | PRN
  Administered 2011-12-22: 100 mg via INTRAVENOUS

## 2011-12-22 MED ORDER — SODIUM CHLORIDE 0.9 % IR SOLN
Status: DC | PRN
  Administered 2011-12-22: 4000 mL

## 2011-12-22 MED ORDER — DIPHENHYDRAMINE HCL 50 MG/ML IJ SOLN: 12.5000 mg | Freq: Four times a day (QID) | INTRAMUSCULAR | Status: DC | PRN

## 2011-12-22 MED ORDER — VANCOMYCIN HCL 1000 MG IV SOLR
1000.0000 mg | INTRAVENOUS | Status: DC | PRN
  Administered 2011-12-22: 2000 mg via INTRAVENOUS

## 2011-12-22 MED ORDER — ONDANSETRON HCL 4 MG PO TABS: 4.0000 mg | ORAL_TABLET | Freq: Four times a day (QID) | ORAL | Status: DC | PRN

## 2011-12-22 MED ORDER — WARFARIN SODIUM 1 MG PO TABS: 1.0000 mg | ORAL_TABLET | Freq: Every day | ORAL | Status: DC

## 2011-12-22 MED ORDER — VANCOMYCIN HCL 1000 MG IV SOLR
1500.0000 mg | Freq: Two times a day (BID) | INTRAVENOUS | Status: AC
  Administered 2011-12-22 – 2011-12-23 (×3): 1500 mg via INTRAVENOUS
  Filled 2011-12-22 (×5): qty 1500

## 2011-12-22 MED ORDER — MENTHOL 3 MG MT LOZG: 1.0000 | LOZENGE | OROMUCOSAL | Status: DC | PRN

## 2011-12-22 MED ORDER — ALPRAZOLAM 0.5 MG PO TABS
0.5000 mg | ORAL_TABLET | Freq: Two times a day (BID) | ORAL | Status: DC | PRN
Start: 2011-12-22 — End: 2011-12-22
  Administered 2011-12-22: 0.5 mg via ORAL
  Filled 2011-12-22: qty 1

## 2011-12-22 MED ORDER — SODIUM CHLORIDE 0.9 % IJ SOLN
10.0000 mL | INTRAMUSCULAR | Status: DC | PRN
  Administered 2012-01-02 – 2012-01-04 (×3): 10 mL

## 2011-12-22 MED ORDER — ALPRAZOLAM 0.5 MG PO TABS
1.0000 mg | ORAL_TABLET | Freq: Two times a day (BID) | ORAL | Status: DC | PRN
  Administered 2011-12-30: 1 mg via ORAL
  Filled 2011-12-22: qty 2

## 2011-12-22 MED ORDER — ONDANSETRON HCL 4 MG/2ML IJ SOLN
4.0000 mg | Freq: Four times a day (QID) | INTRAMUSCULAR | Status: DC | PRN
Start: 2011-12-22 — End: 2011-12-22

## 2011-12-22 MED ORDER — ONDANSETRON HCL 4 MG/2ML IJ SOLN: 4.0000 mg | Freq: Four times a day (QID) | INTRAMUSCULAR | Status: DC | PRN

## 2011-12-22 MED ORDER — ONDANSETRON HCL 4 MG/2ML IJ SOLN
4.0000 mg | Freq: Four times a day (QID) | INTRAMUSCULAR | Status: DC | PRN
  Administered 2011-12-25: 4 mg via INTRAVENOUS
  Filled 2011-12-22 (×3): qty 2

## 2011-12-22 MED ORDER — WARFARIN SODIUM 10 MG PO TABS
10.0000 mg | ORAL_TABLET | Freq: Once | ORAL | Status: AC
  Administered 2011-12-22: 10 mg via ORAL
  Filled 2011-12-22: qty 1

## 2011-12-22 MED ORDER — METOCLOPRAMIDE HCL 5 MG/ML IJ SOLN
5.0000 mg | Freq: Three times a day (TID) | INTRAMUSCULAR | Status: DC | PRN
  Administered 2011-12-30: 10 mg via INTRAVENOUS
  Filled 2011-12-22 (×2): qty 2

## 2011-12-22 MED ORDER — VANCOMYCIN HCL IN DEXTROSE 1-5 GM/200ML-% IV SOLN
1000.0000 mg | Freq: Two times a day (BID) | INTRAVENOUS | Status: AC
  Administered 2011-12-22: 1000 mg via INTRAVENOUS
  Filled 2011-12-22: qty 200

## 2011-12-22 MED ORDER — INSULIN ASPART 100 UNIT/ML ~~LOC~~ SOLN
0.0000 [IU] | Freq: Three times a day (TID) | SUBCUTANEOUS | Status: DC
  Administered 2011-12-22 – 2011-12-23 (×2): 7 [IU] via SUBCUTANEOUS
  Administered 2011-12-23: 11 [IU] via SUBCUTANEOUS
  Administered 2011-12-23 – 2011-12-24 (×3): 7 [IU] via SUBCUTANEOUS
  Administered 2011-12-24: 11 [IU] via SUBCUTANEOUS
  Administered 2011-12-25: 7 [IU] via SUBCUTANEOUS
  Administered 2011-12-25 (×2): 11 [IU] via SUBCUTANEOUS
  Administered 2011-12-26: 7 [IU] via SUBCUTANEOUS
  Administered 2011-12-26: 4 [IU] via SUBCUTANEOUS
  Administered 2011-12-27: 3 [IU] via SUBCUTANEOUS
  Administered 2011-12-27: 4 [IU] via SUBCUTANEOUS

## 2011-12-22 MED ORDER — SODIUM CHLORIDE 0.9 % IJ SOLN: 9.0000 mL | INTRAMUSCULAR | Status: DC | PRN

## 2011-12-22 MED ORDER — PIPERACILLIN-TAZOBACTAM 3.375 G IVPB
3.3750 g | Freq: Three times a day (TID) | INTRAVENOUS | Status: DC
  Administered 2011-12-22 – 2011-12-28 (×17): 3.375 g via INTRAVENOUS
  Filled 2011-12-22 (×19): qty 50

## 2011-12-22 MED ORDER — VANCOMYCIN HCL IN DEXTROSE 1-5 GM/200ML-% IV SOLN
INTRAVENOUS | Status: AC
  Filled 2011-12-22: qty 400

## 2011-12-22 MED ORDER — HYDROMORPHONE 0.3 MG/ML IV SOLN
INTRAVENOUS | Status: DC
  Administered 2011-12-22 – 2011-12-23 (×5): 0.3 mg via INTRAVENOUS
  Administered 2011-12-23: 0.6 mg via INTRAVENOUS
  Administered 2011-12-23: 0.3 mg via INTRAVENOUS
  Administered 2011-12-23: 0.6 mg via INTRAVENOUS
  Administered 2011-12-24: 0.3 mg via INTRAVENOUS
  Administered 2011-12-24: 0.6 mg via INTRAVENOUS
  Administered 2011-12-24: 1.2 mg via INTRAVENOUS
  Administered 2011-12-25 (×2): 0.6 mg via INTRAVENOUS
  Administered 2011-12-25: 1.2 mg via INTRAVENOUS
  Administered 2011-12-25: 08:00:00 via INTRAVENOUS
  Administered 2011-12-25 (×2): 0.3 mg via INTRAVENOUS
  Administered 2011-12-26: 1.5 mg via INTRAVENOUS
  Administered 2011-12-26 (×2): 0.6 mg via INTRAVENOUS
  Administered 2011-12-26 – 2011-12-27 (×4): 0.3 mg via INTRAVENOUS
  Administered 2011-12-27: 0.9 mg via INTRAVENOUS
  Administered 2011-12-27 (×2): 0.6 mg via INTRAVENOUS
  Administered 2011-12-27: 09:00:00 via INTRAVENOUS
  Administered 2011-12-28: 0.6 mg via INTRAVENOUS
  Administered 2011-12-28: 1.2 mg via INTRAVENOUS
  Administered 2011-12-28: 0.3 mg via INTRAVENOUS
  Administered 2011-12-28: 0.9 mg via INTRAVENOUS
  Administered 2011-12-29: via INTRAVENOUS
  Administered 2011-12-29: 0.6 mg via INTRAVENOUS
  Administered 2011-12-29: 0.9 mg via INTRAVENOUS
  Administered 2011-12-29 (×2): 0.6 mg via INTRAVENOUS
  Administered 2011-12-30: 1 mg via INTRAVENOUS
  Administered 2011-12-30: 0.3 mL via INTRAVENOUS
  Administered 2011-12-30: 2 mg via INTRAVENOUS

## 2011-12-22 MED ORDER — HYDRALAZINE HCL 20 MG/ML IJ SOLN
10.0000 mg | Freq: Four times a day (QID) | INTRAMUSCULAR | Status: DC | PRN
  Administered 2012-01-01 – 2012-01-02 (×3): 10 mg via INTRAVENOUS
  Filled 2011-12-22 (×2): qty 0.5

## 2011-12-22 MED ORDER — WARFARIN - PHARMACIST DOSING INPATIENT
Freq: Every day | Status: DC
  Administered 2011-12-25 – 2011-12-26 (×2)
  Filled 2011-12-22 (×4): qty 1

## 2011-12-22 MED ORDER — INSULIN ASPART 100 UNIT/ML ~~LOC~~ SOLN
0.0000 [IU] | Freq: Every day | SUBCUTANEOUS | Status: DC
  Administered 2011-12-22: 3 [IU] via SUBCUTANEOUS
  Administered 2011-12-24 – 2011-12-25 (×2): 2 [IU] via SUBCUTANEOUS
  Administered 2011-12-26: 3 [IU] via SUBCUTANEOUS

## 2011-12-22 MED ORDER — LACTATED RINGERS IV SOLN
INTRAVENOUS | Status: DC | PRN
  Administered 2011-12-22: 08:00:00 via INTRAVENOUS

## 2011-12-22 MED ORDER — METOCLOPRAMIDE HCL 10 MG PO TABS
5.0000 mg | ORAL_TABLET | Freq: Three times a day (TID) | ORAL | Status: DC | PRN
  Filled 2011-12-22: qty 1

## 2011-12-22 MED ORDER — ACETAMINOPHEN 650 MG RE SUPP: 650.0000 mg | Freq: Four times a day (QID) | RECTAL | Status: DC | PRN

## 2011-12-22 SURGICAL SUPPLY — 46 items
BANDAGE ELASTIC 3 VELCRO ST LF (GAUZE/BANDAGES/DRESSINGS) IMPLANT
BANDAGE ELASTIC 4 VELCRO ST LF (GAUZE/BANDAGES/DRESSINGS) IMPLANT
BANDAGE ELASTIC 6 VELCRO ST LF (GAUZE/BANDAGES/DRESSINGS) ×4 IMPLANT
BANDAGE GAUZE ELAST BULKY 4 IN (GAUZE/BANDAGES/DRESSINGS) ×4 IMPLANT
BLADE SURG 10 STRL SS (BLADE) ×2 IMPLANT
BNDG COHESIVE 4X5 TAN STRL (GAUZE/BANDAGES/DRESSINGS) IMPLANT
BNDG ELASTIC 6X10 VLCR STRL LF (GAUZE/BANDAGES/DRESSINGS) ×2 IMPLANT
CLOTH BEACON ORANGE TIMEOUT ST (SAFETY) ×2 IMPLANT
CUFF TOURNIQUET SINGLE 18IN (TOURNIQUET CUFF) IMPLANT
CUFF TOURNIQUET SINGLE 24IN (TOURNIQUET CUFF) IMPLANT
DRAIN PENROSE 1/4X12 LTX STRL (WOUND CARE) ×4 IMPLANT
DRAPE U-SHAPE 47X51 STRL (DRAPES) ×2 IMPLANT
DRSG ADAPTIC 3X8 NADH LF (GAUZE/BANDAGES/DRESSINGS) IMPLANT
DRSG EMULSION OIL 3X3 NADH (GAUZE/BANDAGES/DRESSINGS) IMPLANT
DRSG PAD ABDOMINAL 8X10 ST (GAUZE/BANDAGES/DRESSINGS) ×4 IMPLANT
DURAPREP 26ML APPLICATOR (WOUND CARE) IMPLANT
ELECT REM PT RETURN 9FT ADLT (ELECTROSURGICAL) ×2
ELECTRODE REM PT RTRN 9FT ADLT (ELECTROSURGICAL) ×1 IMPLANT
GAUZE XEROFORM 5X9 LF (GAUZE/BANDAGES/DRESSINGS) ×2 IMPLANT
GLOVE BIO SURGEON STRL SZ8.5 (GLOVE) ×4 IMPLANT
GLOVE SS PI 9.0 STRL (GLOVE) ×2 IMPLANT
GLOVE SURG SS PI 8.5 STRL IVOR (GLOVE) ×2
GLOVE SURG SS PI 8.5 STRL STRW (GLOVE) ×2 IMPLANT
GOWN PREVENTION PLUS XLARGE (GOWN DISPOSABLE) ×2 IMPLANT
GOWN PREVENTION PLUS XXLARGE (GOWN DISPOSABLE) ×4 IMPLANT
GOWN STRL NON-REIN LRG LVL3 (GOWN DISPOSABLE) IMPLANT
HANDPIECE INTERPULSE COAX TIP (DISPOSABLE) ×1
HOVERMATT SINGLE USE (MISCELLANEOUS) ×2 IMPLANT
KIT BASIN OR (CUSTOM PROCEDURE TRAY) ×2 IMPLANT
KIT ROOM TURNOVER OR (KITS) ×2 IMPLANT
MANIFOLD NEPTUNE II (INSTRUMENTS) ×2 IMPLANT
NS IRRIG 1000ML POUR BTL (IV SOLUTION) ×2 IMPLANT
PACK ORTHO EXTREMITY (CUSTOM PROCEDURE TRAY) ×2 IMPLANT
PAD ARMBOARD 7.5X6 YLW CONV (MISCELLANEOUS) ×4 IMPLANT
SET HNDPC FAN SPRY TIP SCT (DISPOSABLE) ×1 IMPLANT
SPONGE GAUZE 4X4 12PLY (GAUZE/BANDAGES/DRESSINGS) ×2 IMPLANT
SPONGE LAP 18X18 X RAY DECT (DISPOSABLE) ×2 IMPLANT
SPONGE LAP 4X18 X RAY DECT (DISPOSABLE) IMPLANT
STOCKINETTE IMPERVIOUS 9X36 MD (GAUZE/BANDAGES/DRESSINGS) ×2 IMPLANT
TOWEL OR 17X24 6PK STRL BLUE (TOWEL DISPOSABLE) ×2 IMPLANT
TOWEL OR 17X26 10 PK STRL BLUE (TOWEL DISPOSABLE) ×2 IMPLANT
TUBE ANAEROBIC SPECIMEN COL (MISCELLANEOUS) ×2 IMPLANT
TUBE CONNECTING 12X1/4 (SUCTIONS) ×2 IMPLANT
UNDERPAD 30X30 INCONTINENT (UNDERPADS AND DIAPERS) ×2 IMPLANT
WATER STERILE IRR 1000ML POUR (IV SOLUTION) IMPLANT
YANKAUER SUCT BULB TIP NO VENT (SUCTIONS) ×2 IMPLANT

## 2011-12-22 NOTE — Transfer of Care (Signed)
Immediate Anesthesia Transfer of Care Note  Patient: Adrian Khan  Procedure(s) Performed: Procedure(s) (LRB): IRRIGATION AND DEBRIDEMENT EXTREMITY (Left)  Patient Location: PACU  Anesthesia Type: General  Level of Consciousness: awake  Airway & Oxygen Therapy: Patient connected to face mask oxygen  Post-op Assessment: Report given to PACU RN  Post vital signs: Reviewed and stable  Complications: No apparent anesthesia complications

## 2011-12-22 NOTE — H&P (Signed)
Adrian Khan, Adrian Khan NO.:  [id number]  MEDICAL RECORD NO.:  [id number]  LOCATION:  5032                         FACILITY:  [facility]  PHYSICIAN:  Myrtie Neither, MD      DATE OF BIRTH:  10/30/1975  DATE OF ADMISSION:  12/21/2011 DATE OF DISCHARGE:                             HISTORY & PHYSICAL   CHIEF COMPLAINT:  Painful swollen draining, left foot.  HISTORY OF PRESENT ILLNESS:  This is a 36 year old black male, known diabetic, morbid obesity, being treated for blistering and cellulitis of his left foot.  The patient's condition progressively worsened with small abscesses, plantar aspect of his left foot and foul odor purulent material.  PAST MEDICAL HISTORY: 1. Diabetes mellitus type 2. 2. Obstructive sleep apnea. 3. Allergic rhinitis. 4. Sleep apnea. 5. Morbid obesity. 6. High blood pressure.  SOCIAL HISTORY:  Negative for use of alcohol, tobacco, or illegal drugs.  FAMILY HISTORY:  High blood pressure and diabetes mellitus.  ALLERGIES:  SULFA.  REVIEW OF SYSTEMS:  Basically that in history of present illness.  No cardiac, respiratory, no urinary or bowel symptoms.  PHYSICAL EXAMINATION:  VITAL SIGNS:  Height 6 feet, weight 174 kg, blood pressure 149/88, pulse 96, respirations 20. HEENT:  Head; normocephalic.  Eyes; conjunctivae and sclerae clear. NECK:  Supple. CHEST:  Clear. CARDIAC:  S1, S2 regular. ABDOMEN:  Morbid obesity. EXTREMITIES:  Left foot, diffusely swollen forefoot, small open areas on the plantar aspect of the foot, tender swollen left foot dorsally with purulent material draining along the plantar aspect of the left foot.  X-ray of the left foot does not show any evidence of osteomyelitis, but diffuse soft tissue swelling.  IMPRESSION: 1. Diabetic abscess, left foot. 2. Morbid obesity. 3. Diabetes mellitus. 4. Hypertension. 5. Obstructive sleep apnea.  PLAN:  I and D of the left foot.     Myrtie Neither,  MD     AC/MEDQ  D:  12/21/2011  T:  12/22/2011  Job:  [id number]

## 2011-12-22 NOTE — Anesthesia Postprocedure Evaluation (Signed)
Anesthesia Post Note  Patient: Adrian Khan  Procedure(s) Performed: Procedure(s) (LRB): IRRIGATION AND DEBRIDEMENT EXTREMITY (Left)  Anesthesia type: General  Patient location: PACU  Post pain: Pain level controlled and Adequate analgesia  Post assessment: Post-op Vital signs reviewed, Patient's Cardiovascular Status Stable, Respiratory Function Stable, Patent Airway and Pain level controlled  Last Vitals:  Filed Vitals:   12/22/11 1145  BP:   Pulse: 106  Temp: 36.6 C  Resp: 28    Post vital signs: Reviewed and stable  Level of consciousness: awake, alert  and oriented  Complications: No apparent anesthesia complications

## 2011-12-22 NOTE — Anesthesia Procedure Notes (Signed)
Procedure Name: Intubation Date/Time: 12/22/2011 8:47 AM Performed by: Rossie Muskrat Pre-anesthesia Checklist: Patient identified, Timeout performed, Emergency Drugs available, Suction available and Patient being monitored Patient Re-evaluated:Patient Re-evaluated prior to inductionOxygen Delivery Method: Circle system utilized Preoxygenation: Pre-oxygenation with 100% oxygen Intubation Type: IV induction Laryngoscope Size: Miller and 2 Grade View: Grade I Tube type: Oral Tube size: 7.5 mm Number of attempts: 1 Airway Equipment and Method: Stylet Placement Confirmation: ETT inserted through vocal cords under direct vision,  breath sounds checked- equal and bilateral and positive ETCO2 Secured at: 23 cm Tube secured with: Tape Dental Injury: Teeth and Oropharynx as per pre-operative assessment

## 2011-12-22 NOTE — Consult Note (Signed)
Requesting physician: Dr. Myrtie Neither  Reason for consultation: Shortness of breath  History of Present Illness: Pt is 36 yo male with multiple and complex medical problems including uncontrolled diabetes type II, OSA, HTN who was initially admitted on March 5 th, 2013 with left foot abscess which has required I&D by Dr. Montez Morita (the surgery was done today 12/22/2011). Pt was tachypnic after the surgery but per Dr. Montez Morita has done well during the surgery and had no complications. Pt reports shortness of breath with fevers and chills, and one recorder temp was ~ 103 F in the last 24 hours. He denies chest pain but does report productive cough of clear sputum that has been getting worse over the past 24 hours. He denies any abdominal pain or urinary concerns, he denies pain in the left or right foot and tells me that pain is rather well controlled on current medication regimen.   Allergies:   Allergies  Allergen Reactions  . Sulfonamide Derivatives     REACTION: cramps    Medications:  Scheduled Meds:   . coumadin book   Does not apply Once  . HYDROmorphone PCA 0.3 mg/mL   Intravenous Q4H  . insulin aspart  0-20 Units Subcutaneous Q4H  . insulin aspart  0-20 Units Subcutaneous TID WC  . insulin aspart  0-5 Units Subcutaneous QHS  . piperacillin-tazobactam (ZOSYN)  IV  3.375 g Intravenous Q8H  . vancomycin  1,000 mg Intravenous Q12H  . warfarin  10 mg Oral ONCE-1800  . warfarin   Does not apply Once  . Warfarin - Pharmacist Dosing Inpatient   Does not apply q1800  . DISCONTD: chlorhexidine  60 mL Topical Once  . DISCONTD: HYDROmorphone PCA 0.3 mg/mL       Continuous Infusions:   . sodium chloride    . DISCONTD: sodium chloride 100 mL/hr at 12/22/11 1111   PRN Meds:.acetaminophen, acetaminophen, ALPRAZolam, hydrALAZINE, menthol-cetylpyridinium, metoCLOPramide (REGLAN) injection, metoCLOPramide, ondansetron (ZOFRAN) IV, ondansetron, phenol, sodium chloride, DISCONTD:  acetaminophen, DISCONTD: ALPRAZolam, DISCONTD: diphenhydrAMINE, DISCONTD: diphenhydrAMINE, DISCONTD: HYDROmorphone, DISCONTD: naloxone, DISCONTD: ondansetron (ZOFRAN) IV, DISCONTD: ondansetron (ZOFRAN) IV, DISCONTD: sodium chloride DISCONTD: sodium chloride irrigation  Social History:  reports that he has never smoked. He has never used smokeless tobacco. His alcohol and drug histories not on file.  No family history on file.  Review of Systems:  Constitutional: diaphoresis, appetite change and fatigue.  HEENT: Denies photophobia, eye pain, redness, hearing loss, ear pain, congestion, sore throat, rhinorrhea, sneezing, mouth sores, trouble swallowing, neck pain, neck stiffness and tinnitus.   Respiratory: Denies chest tightness,  and wheezing.   Cardiovascular: Denies chest pain, palpitations and leg swelling.  Gastrointestinal: Denies nausea, vomiting, abdominal pain, diarrhea, constipation, blood in stool and abdominal distention.  Genitourinary: Denies dysuria, urgency, frequency, hematuria, flank pain and difficulty urinating.  Musculoskeletal: Denies myalgias, back pain, joint swelling, arthralgias and gait problem.  Skin: Denies pallor, rash and wound.  Neurological: Denies dizziness, seizures, syncope, weakness, light-headedness, numbness and headaches.  Hematological: Denies adenopathy. Easy bruising, personal or family bleeding history  Psychiatric/Behavioral: Denies suicidal ideation, mood changes, confusion, nervousness, sleep disturbance and agitation  Physical Exam:  Filed Vitals:   12/22/11 1210 12/22/11 1220 12/22/11 1242 12/22/11 1600  BP: 162/90     Pulse: 108     Temp: 99.1 F (37.3 C)     TempSrc: Oral     Resp: 40 28 40 36  Height:      Weight:      SpO2: 97%  96%  97%    Intake/Output Summary (Last 24 hours) at 12/22/11 1801 Last data filed at 12/22/11 1704  Gross per 24 hour  Intake 1989.5 ml  Output    625 ml  Net 1364.5 ml    General: Alert, awake,  oriented x3, in mild distress due to shortness of breath HEENT: No bruits, no goiter. Dry mucous membranes. Heart: Regular rhythm but tachycardic, without murmurs, rubs, gallops. Lungs: Clear to auscultation bilaterally but crackles minimal at bases, tachypnea. No wheezing, no rhonchi, no rales. Abdomen: Soft, nontender, non distended but obese, positive bowel sounds. Extremities: No clubbing or cyanosis, positive pedal pulses. Neuro: Grossly nonfocal.  Labs on Admission:  CBC:    Component Value Date/Time   WBC 34.9* 12/21/2011 2342   HGB 9.8* 12/21/2011 2342   HCT 28.6* 12/21/2011 2342   PLT 538* 12/21/2011 2342   MCV 82.7 12/21/2011 2342   NEUTROABS 8.9* 04/18/2008 0635   LYMPHSABS 1.9 04/18/2008 0635   MONOABS 0.6 04/18/2008 0635   EOSABS 0.1 04/18/2008 0635   BASOSABS 0.1 04/18/2008 0635    Basic Metabolic Panel:    Component Value Date/Time   NA 128* 12/21/2011 2342   K 3.6 12/21/2011 2342   CL 93* 12/21/2011 2342   CO2 23 12/21/2011 2342   BUN 21 12/21/2011 2342   CREATININE 0.81 12/21/2011 2342   GLUCOSE 265* 12/21/2011 2342   CALCIUM 8.7 12/21/2011 2342    Lab 12/21/11 2342  WBC 34.9*  HGB 9.8*  HCT 28.6*  PLT 538*  MCV 82.7  MCH 28.3  MCHC 34.3  RDW 12.7  LYMPHSABS --  MONOABS --  EOSABS --  BASOSABS --  BANDABS --    Lab 12/21/11 2342  NA 128*  K 3.6  CL 93*  CO2 23  GLUCOSE 265*  BUN 21  CREATININE 0.81  CALCIUM 8.7  MG --    Lab 12/22/11 1330  INR 1.30  PROTIME --    Recent Results (from the past 240 hour(s))  SURGICAL PCR SCREEN     Status: Normal   Collection Time   12/22/11  7:41 AM      Component Value Range Status Comment   MRSA, PCR NEGATIVE  NEGATIVE  Final    Staphylococcus aureus NEGATIVE  NEGATIVE  Final     Radiological Exams on Admission: Dg Chest 2 View 12/21/2011  IMPRESSION: Enlargement of cardiac silhouette with pulmonary vascular congestion. Limitations of exam as above, without definite acute abnormalities.    Assessment/Plan Principal  Problem:  *Sepsis - with elevated WBC ( > 30000), tachycardia, fever > 102 F - blood cultures from 03/05 still pending - likely primary source of infection is diabetic abscess which was I&D'd today by Dr. Montez Morita - per Dr. Montez Morita surgical site looking clean and with no bleeding noted - I will broaden the coverage to Vancomycin and Zosyn - proceed with sepsis work up: procalcitonin, blood cultures, ABG's, CBC, CMET, lactic acid - will bolus only 500 cc for now given finding on earlier CXR of pulmonary vascular congestion, I have no 2 D ECHO to determine degree of heart failure - order 2 D ECHO - change fluids to NS - transfer to SDU - discussed with PCCM on call  Active Problems:  DIABETES, TYPE 2 - uncontrolled - will continue Insulin for now and will readjust the regimen as indicated   Leukocytosis - likely secondary to sepsis secondary to left foot abscess - will proceed with sepsis work up as noted above - obtain  CBC stat and in AM   Foot abscess - now status post I&D by Dr. Montez Morita - continue wound care - Dr. Montez Morita will re evaluate in AM   Thrombocytosis - likely reactive - will obtain follow up CBC   Fever - likely secondary to sepsis - please see principal problem   Hyponatremia - unclear etiology at this time - possibly prerenal etiology but unclear if contribution of heart failure - will obtain 2 D ECHO   OBSTRUCTIVE SLEEP APNEA - current ABG with hypoxic respiratory alkalosis - monitor in SDU   Anemia due to chronic illness - Hg/Hct stable and at pt's baseline - obtain follow up CBC  EDUCATION - test results and diagnostic studies were discussed with patient and pt's family who was present at the bedside (mom) - patient and family have verbalized the understanding - questions were answered at the bedside and contact information was provided for additional questions or concerns  Time Spent on Admission: Over 30 minutes  MAGICK-MYERS, ISKRA 12/22/2011,  6:01 PM  [facility] [phone]

## 2011-12-22 NOTE — Progress Notes (Signed)
Resp called regarding pt tachypnea and CO2 status. Resp in. No further interventions. Will continue to monitor.

## 2011-12-22 NOTE — Anesthesia Preprocedure Evaluation (Signed)
Anesthesia Evaluation  Patient identified by MRN, date of birth, ID band Patient awake    Reviewed: Allergy & Precautions, H&P , NPO status , Patient's Chart, lab work & pertinent test results  Airway Mallampati: III  Neck ROM: full    Dental   Pulmonary sleep apnea ,          Cardiovascular     Neuro/Psych    GI/Hepatic   Endo/Other  Diabetes mellitus-, Type obesity  Renal/GU      Musculoskeletal   Abdominal   Peds  Hematology   Anesthesia Other Findings   Reproductive/Obstetrics                           Anesthesia Physical Anesthesia Plan  ASA: II  Anesthesia Plan: General   Post-op Pain Management:    Induction: Intravenous  Airway Management Planned: LMA  Additional Equipment:   Intra-op Plan:   Post-operative Plan:   Informed Consent: I have reviewed the patients History and Physical, chart, labs and discussed the procedure including the risks, benefits and alternatives for the proposed anesthesia with the patient or authorized representative who has indicated his/her understanding and acceptance.     Plan Discussed with: CRNA and Surgeon  Anesthesia Plan Comments:         Anesthesia Quick Evaluation

## 2011-12-22 NOTE — Op Note (Signed)
NAMEIZEA, LIVOLSI NO.:  [id number]  MEDICAL RECORD NO.:  [id number]  LOCATION:  5032                         FACILITY:  [facility]  PHYSICIAN:  Myrtie Neither, MD      DATE OF BIRTH:  January 14, 1976  DATE OF PROCEDURE:  12/22/2011 DATE OF DISCHARGE:                              OPERATIVE REPORT   PREOPERATIVE DIAGNOSIS:  Abscess and infected left diabetic foot.  POSTOPERATIVE DIAGNOSIS:  Abscess and infected left diabetic foot.  ANESTHESIA:  General.  PROCEDURE:  Incision and drainage and debridement of left foot and placement of 3 Penrose drains.  CULTURES:  Aerobic and anaerobic and Gram stain were done.  PROCEDURE IN DETAIL:  The patient was taken to the operating room. After given adequate preop medications, given general anesthesia and intubated.  Left lower extremity was prepped with Betadine scrub and paint with Betadine solution and draped in sterile manner.  No tourniquet was used.  Left foot was draped in sterile fashion. Initially debridement of the dead skin about the dorsum aspect of the foot was done, followed by 2 incisions dorsally, one in line with the second metatarsal and the other one at the fourth metatarsal.  Abundant purulent material was evacuated with foul odor, curette and sharp and blunt dissection was done for the debridement.  Cultures aerobic and anaerobic were done as well as Gram stain.  On the plantar aspect of the foot, there were several openings about the metatarsal head of the foot. A hockey-stick incision was made from lateral to distal across the metatarsal head gone through the skin and subcutaneous tissue, and again curettement was done with evacuation of purulent material about the plantar aspect of the foot.  One Penrose drain was placed there and 2 Penrose drains were placed in the 2 dorsal incisions.  After copious irrigation with an irrigator and debride, drains were put in place, followed by a compressive dressing.   The patient received 2 g of vancomycin after Gram stains were taken.  The patient tolerated the procedure quite well, went to recovery room in stable and satisfactory condition.     Myrtie Neither, MD     AC/MEDQ  D:  12/22/2011  T:  12/22/2011  Job:  [id number]

## 2011-12-22 NOTE — Progress Notes (Signed)
Inpatient Diabetes Program Recommendations  AACE/ADA: New Consensus Statement on Inpatient Glycemic Control (2009)  Target Ranges:  Prepandial:   less than 140 mg/dL      Peak postprandial:   less than 180 mg/dL (1-2 hours)      Critically ill patients:  140 - 180 mg/dL   Reason for Visit: Sustained hyperglycemia in the 200's  Inpatient Diabetes Program Recommendations Insulin - Basal: Please add some Lantus daily or q HS while here.  Start with 15 units.  Note: Thank you, Lenor Coffin, RN, CNS, Diabetes Coordinator [phone])

## 2011-12-22 NOTE — Progress Notes (Signed)
ANTIBIOTIC CONSULT NOTE - INITIAL  Pharmacy Consult for Vancomycin Indication: rule out sepsis  Allergies  Allergen Reactions  . Sulfonamide Derivatives     REACTION: cramps    Patient Measurements: Height: 5\' 11"  (180.3 cm) Weight: 380 lb 14.4 oz (172.775 kg) IBW/kg (Calculated) : 75.3   Vital Signs: Temp: 99.1 F (37.3 C) (03/06 1210) Temp src: Oral (03/06 1210) BP: 162/90 mmHg (03/06 1210) Pulse Rate: 108  (03/06 1210) Intake/Output from previous day:   Intake/Output from this shift: Total I/O In: 1989.5 [P.O.:240; I.V.:1400; IV Piggyback:349.5] Out: 625 [Urine:600; Blood:25]  Labs:  [facility] 12/21/11 2342  WBC 34.9*  HGB 9.8*  PLT 538*  LABCREA --  CREATININE 0.81   Estimated Creatinine Clearance: 209.7 ml/min (by C-G formula based on Cr of 0.81). No results found for this basename: VANCOTROUGH:2,VANCOPEAK:2,VANCORANDOM:2,GENTTROUGH:2,GENTPEAK:2,GENTRANDOM:2,TOBRATROUGH:2,TOBRAPEAK:2,TOBRARND:2,AMIKACINPEAK:2,AMIKACINTROU:2,AMIKACIN:2, in the last 72 hours   Microbiology: Recent Results (from the past 720 hour(s))  SURGICAL PCR SCREEN     Status: Normal   Collection Time   12/22/11  7:41 AM      Component Value Range Status Comment   MRSA, PCR NEGATIVE  NEGATIVE  Final    Staphylococcus aureus NEGATIVE  NEGATIVE  Final     Medical History: No past medical history on file.  Medications:  Scheduled:    . coumadin book   Does not apply Once  . HYDROmorphone PCA 0.3 mg/mL   Intravenous Q4H  . insulin aspart  0-20 Units Subcutaneous Q4H  . insulin aspart  0-20 Units Subcutaneous TID WC  . insulin aspart  0-5 Units Subcutaneous QHS  . piperacillin-tazobactam (ZOSYN)  IV  3.375 g Intravenous Q8H  . vancomycin  1,500 mg Intravenous Q12H  . vancomycin  1,000 mg Intravenous Q12H  . warfarin  10 mg Oral ONCE-1800  . warfarin   Does not apply Once  . Warfarin - Pharmacist Dosing Inpatient   Does not apply q1800  . DISCONTD: chlorhexidine  60 mL Topical Once    . DISCONTD: HYDROmorphone PCA 0.3 mg/mL       Assessment: Pt known to pharmacy from Coumadin dosing, now with leukocytosis (wbc 34.9) and fever (Tm = 102.7) to, tachycardia (HR 100s-110s), suspected for sepsis, starting vancomycin and zosyn for empiric coverage. Blood Cxs pending. Scr 0.81, est. GFR > 90 ml/min. One dose of vancomycin 1g was given this after noon at 1400  Goal of Therapy:  Vancomycin trough level 15-20 mcg/ml  Plan:  - Vancomycin 1500 mg IV q12hrs, first dose now for total 2500mg  load  - f/u renal function and cultures.  Riki Rusk 12/22/2011,6:25 PM

## 2011-12-22 NOTE — Progress Notes (Signed)
Resp in to draw ABG. Continuing to monitor.

## 2011-12-22 NOTE — Progress Notes (Signed)
ANTIBIOTIC CONSULT NOTE - INITIAL  Pharmacy Consult for Zosyn Indication: rule out pneumonia / tachypnea  Allergies  Allergen Reactions  . Sulfonamide Derivatives     REACTION: cramps    Patient Measurements: Height: 5\' 11"  (180.3 cm) Weight: 380 lb 14.4 oz (172.775 kg) IBW/kg (Calculated) : 75.3   Vital Signs: Temp: 99.1 F (37.3 C) (03/06 1210) Temp src: Oral (03/06 1210) BP: 162/90 mmHg (03/06 1210) Pulse Rate: 108  (03/06 1210) Intake/Output from previous day:   Intake/Output from this shift: Total I/O In: 1440 [P.O.:240; I.V.:900; IV Piggyback:300] Out: 625 [Urine:600; Blood:25]  Labs:  [facility] 12/21/11 2342  WBC 34.9*  HGB 9.8*  PLT 538*  LABCREA --  CREATININE 0.81   Estimated Creatinine Clearance: 209.7 ml/min (by C-G formula based on Cr of 0.81). No results found for this basename: VANCOTROUGH:2,VANCOPEAK:2,VANCORANDOM:2,GENTTROUGH:2,GENTPEAK:2,GENTRANDOM:2,TOBRATROUGH:2,TOBRAPEAK:2,TOBRARND:2,AMIKACINPEAK:2,AMIKACINTROU:2,AMIKACIN:2, in the last 72 hours   Microbiology: Recent Results (from the past 720 hour(s))  SURGICAL PCR SCREEN     Status: Normal   Collection Time   12/22/11  7:41 AM      Component Value Range Status Comment   MRSA, PCR NEGATIVE  NEGATIVE  Final    Staphylococcus aureus NEGATIVE  NEGATIVE  Final     Medical History: No past medical history on file.  Medications:  Scheduled:    . coumadin book   Does not apply Once  . HYDROmorphone PCA 0.3 mg/mL   Intravenous Q4H  . insulin aspart  0-20 Units Subcutaneous Q4H  . insulin aspart  0-20 Units Subcutaneous TID WC  . insulin aspart  0-5 Units Subcutaneous QHS  . vancomycin  1,000 mg Intravenous Q12H  . warfarin  10 mg Oral ONCE-1800  . warfarin   Does not apply Once  . Warfarin - Pharmacist Dosing Inpatient   Does not apply q1800  . DISCONTD: chlorhexidine  60 mL Topical Once  . DISCONTD: HYDROmorphone PCA 0.3 mg/mL       Assessment: Pt known to pharmacy from Coumadin  dosing, now beginning antibiotic therapy for rule out pneuomonia / tachpnea  Goal of Therapy:  Appropriate dosing  Plan:  1) Zosyn 3.375 grams iv Q 8 hours - 4 hour dosing 2) Pharmacy will continue to follow  Elwin Sleight 12/22/2011,3:58 PM

## 2011-12-22 NOTE — Brief Op Note (Signed)
12/21/2011 - 12/22/2011  9:45 AM  PATIENT:  Illene Bolus  36 y.o. male  PRE-OPERATIVE DIAGNOSIS:  Left Foot Abscess  POST-OPERATIVE DIAGNOSIS:  Left Foot Abscess  PROCEDURE:  Procedure(s) (LRB): IRRIGATION AND DEBRIDEMENT EXTREMITY (Left)  SURGEON:  Surgeon(s) and Role:    * Kennieth Rad, MD - Primary  PHYSICIAN ASSISTANT:   ASSISTANTS: none   ANESTHESIA:   general  EBL:     BLOOD ADMINISTERED:none  DRAINS: Penrose drain in the 3 drains in left foot   LOCAL MEDICATIONS USED:  NONE  SPECIMEN:  No Specimen  DISPOSITION OF SPECIMEN:  N/A  COUNTS:  YES  TOURNIQUET:  * No tourniquets in log *  DICTATION: .Other Dictation: Dictation Number report #[id number]  PLAN OF CARE: Admit to inpatient   PATIENT DISPOSITION:  PACU - hemodynamically stable.   Delay start of Pharmacological VTE agent (>24hrs) due to surgical blood loss or risk of bleeding: yes

## 2011-12-22 NOTE — Preoperative (Signed)
Beta Blockers   Reason not to administer Beta Blockers:Pt does not take B-blockers 

## 2011-12-22 NOTE — Progress Notes (Addendum)
Pt admitted for infection of L foot. Pt is back from PACU following an I and D of L foot. Pt has an ace compressive dressing to L foot that he keeps elevated. Ice to L foot. Pt has a history of decreased sensation of BLE. Pt has + cap refill of bil feet toes and + pedal pulses bilaterally. Bil feet are warm to touch. Pt is alert and oriented x 3 on admit to floor. Pt is lethargic but very arousable. Pt becomes anxious easily. Pt mother is at bedside and supportive. Pt, per rept, has a penrose drain beneath the clean, dry and intact ace dressing. Pt admitted from PACU tachypneic. Resps vary from 24-44. Resps even and shallow. Pt lungs CTA but diminished in the bases. Pt, per rept, has a history of sleep apnea but refuses CPAP. Pt has a productive cough with mod amount of thick yellow white phlegm. Pt given IS with verb instruction. Pt verb understanding and agrees to comply. Pt sats 96% 2lpm. Pt is able to raise IS to 2000 ml. Heart rate regular rate and rhythm but tachy in the low 100's. No s/sx cardiac distress and no c/o such. No c/o resp distress. Pt denies being SOB. Pt, per PACU RN rept, was incontinent of bowel and bladder in PACU. BS+x4. Pt denies nausea or vomiting. Abdomen is soft and nontender but obese.

## 2011-12-22 NOTE — Progress Notes (Signed)
Pt transferred from 5032 via bed at 1838hrs.  CHG bath given and pt oriented to unit.  Pt inct of small amount of stool and pericare given, groin folds and penis red and excoriated, barrier cream applied after cleaning.  Pt not using dilaudid PCA and alarming due to increased RR.  Will notify Dr. Elisabeth Pigeon.  Pt RR 44 at this time.  Report to be given to night shift RN.

## 2011-12-22 NOTE — Progress Notes (Addendum)
ANTICOAGULATION CONSULT NOTE - Initial Consult  Pharmacy Consult for warfarin Indication: VTE prophylaxis  Allergies  Allergen Reactions  . Sulfonamide Derivatives     REACTION: cramps    Patient Measurements: Height: 5\' 11"  (180.3 cm) Weight: 380 lb 14.4 oz (172.775 kg) IBW/kg (Calculated) : 75.3  Heparin Dosing Weight:   Vital Signs: Temp: 97.8 F (36.6 C) (03/06 1145) Temp src: Oral (03/06 0359) BP: 131/72 mmHg (03/06 1144) Pulse Rate: 106  (03/06 1145)  Labs:  [facility] 12/21/11 2342  HGB 9.8*  HCT 28.6*  PLT 538*  APTT --  LABPROT --  INR --  HEPARINUNFRC --  CREATININE 0.81  CKTOTAL --  CKMB --  TROPONINI --   Estimated Creatinine Clearance: 209.7 ml/min (by C-G formula based on Cr of 0.81).  Medical History: No past medical history on file.  Medications:  Prescriptions prior to admission  Medication Sig Dispense Refill  . ibuprofen (ADVIL,MOTRIN) 200 MG tablet Take 400 mg by mouth every 6 (six) hours as needed. For pain      . sitaGLIPtin (JANUVIA) 100 MG tablet Take 100 mg by mouth 2 (two) times daily.        Assessment: 36 yo M s/p irrigation and debridement of L foot abscess.  Warfarin points = 11  Goal of Therapy:  INR 2-3   Plan: Check baseline INR now, then daily  Warfarin 10 mg po x1 Warfarin book/video to patient   Jill Side L. Illene Bolus, PharmD, BCPS Clinical Pharmacist Pager: [phone] 12/22/2011 12:47 PM   Addendum: baseline INR = 1.3  - Arther Dames PharmD BCPS [phone] 12/22/2011 2:54 PM

## 2011-12-22 NOTE — Progress Notes (Signed)
Pt resps are now running 35-45. Resps remain shallow. Rept to Dr. Montez Morita.  Xanax given per MD order. Pt is anxious. Oxygen sat 97% 2lpm. Pt denies SOB. CO2 34. Will continue to monitor.

## 2011-12-22 NOTE — Progress Notes (Signed)
Pt more restful after Xanax but still remains tachypneic. Rept to Dr. Montez Morita. Dr. Montez Morita to call medical consult. Will continue to monitor.

## 2011-12-23 DIAGNOSIS — R6510 Systemic inflammatory response syndrome (SIRS) of non-infectious origin without acute organ dysfunction: Secondary | ICD-10-CM | POA: Diagnosis present

## 2011-12-23 DIAGNOSIS — I517 Cardiomegaly: Secondary | ICD-10-CM

## 2011-12-23 DIAGNOSIS — J9621 Acute and chronic respiratory failure with hypoxia: Secondary | ICD-10-CM | POA: Diagnosis present

## 2011-12-23 DIAGNOSIS — E869 Volume depletion, unspecified: Secondary | ICD-10-CM | POA: Diagnosis present

## 2011-12-23 LAB — URINALYSIS, ROUTINE W REFLEX MICROSCOPIC
Glucose, UA: NEGATIVE mg/dL
Protein, ur: 100 mg/dL — AB

## 2011-12-23 LAB — GLUCOSE, CAPILLARY
Glucose-Capillary: 253 mg/dL — ABNORMAL HIGH (ref 70–99)
Glucose-Capillary: 234 mg/dL — ABNORMAL HIGH (ref 70–99)
Glucose-Capillary: 185 mg/dL — ABNORMAL HIGH (ref 70–99)

## 2011-12-23 LAB — BASIC METABOLIC PANEL
BUN: 23 mg/dL (ref 6–23)
Chloride: 94 mEq/L — ABNORMAL LOW (ref 96–112)
Creatinine, Ser: 1.37 mg/dL — ABNORMAL HIGH (ref 0.50–1.35)
GFR calc Af Amer: 77 mL/min — ABNORMAL LOW (ref >90)
GFR calc non Af Amer: 67 mL/min — ABNORMAL LOW (ref >90)

## 2011-12-23 LAB — CBC
MCHC: 33.3 g/dL (ref 30.0–36.0)
MCV: 83.2 fL (ref 78.0–100.0)
Platelets: 495 10*3/uL — ABNORMAL HIGH (ref 150–400)
RDW: 13.1 % (ref 11.5–15.5)
WBC: 27.5 10*3/uL — ABNORMAL HIGH (ref 4.0–10.5)

## 2011-12-23 LAB — URINE MICROSCOPIC-ADD ON

## 2011-12-23 LAB — SODIUM, URINE, RANDOM: Sodium, Ur: <10 mEq/L

## 2011-12-23 LAB — OSMOLALITY, URINE: Osmolality, Ur: 329 mOsm/kg — ABNORMAL LOW (ref 390–1090)

## 2011-12-23 MED ORDER — WHITE PETROLATUM GEL
Status: AC
  Administered 2011-12-23: 11:00:00
  Filled 2011-12-23: qty 5

## 2011-12-23 MED ORDER — WARFARIN SODIUM 10 MG PO TABS
10.0000 mg | ORAL_TABLET | Freq: Once | ORAL | Status: AC
  Administered 2011-12-23: 10 mg via ORAL
  Filled 2011-12-23: qty 1

## 2011-12-23 MED ORDER — LINAGLIPTIN 5 MG PO TABS
5.0000 mg | ORAL_TABLET | Freq: Every day | ORAL | Status: DC
  Administered 2011-12-23 – 2012-01-05 (×13): 5 mg via ORAL
  Filled 2011-12-23 (×14): qty 1

## 2011-12-23 MED ORDER — SODIUM CHLORIDE 0.9 % IV SOLN
INTRAVENOUS | Status: DC
  Administered 2011-12-23 – 2011-12-25 (×2): via INTRAVENOUS
  Administered 2011-12-26 – 2011-12-27 (×2): 20 mL/h via INTRAVENOUS
  Administered 2011-12-28: 06:00:00 via INTRAVENOUS
  Administered 2011-12-28 – 2012-01-04 (×3): 20 mL/h via INTRAVENOUS

## 2011-12-23 MED ORDER — LIVING WELL WITH DIABETES BOOK
Freq: Once | Status: AC
  Administered 2011-12-23: 15:00:00
  Filled 2011-12-23: qty 1

## 2011-12-23 MED ORDER — INSULIN GLARGINE 100 UNIT/ML ~~LOC~~ SOLN
5.0000 [IU] | Freq: Every day | SUBCUTANEOUS | Status: DC
  Administered 2011-12-23 – 2011-12-24 (×2): 5 [IU] via SUBCUTANEOUS
  Filled 2011-12-23: qty 3

## 2011-12-23 NOTE — Progress Notes (Signed)
  Echocardiogram 2D Echocardiogram has been performed.  Adrian Khan Nira Retort 12/23/2011, 10:28 AM

## 2011-12-23 NOTE — Progress Notes (Signed)
OT Sign off note: Spoke with RN who determined that OT order was accidentally entered. Please re-order when appropriate. Thank you.  Glendale Chard, OTR/L Pager: [phone] 12/23/2011

## 2011-12-23 NOTE — Progress Notes (Signed)
Patient ID: Adrian Khan, male   DOB: [birth year], 36 y.o.   MRN: [id number] Patient under cardiac echo in his room ,still has cough with clear sputum,  Respiratory rate 22, afebrile, blood stain drainage from dressing, H&H 8&26, CHRONIC ANEMIA, PLAN SURGICAL DEBREIDMENT 7:30 TOMORROW  IF STABLE.

## 2011-12-23 NOTE — Progress Notes (Signed)
Pharmacy CONSULT NOTE   Pharmacy Consult for Zosyn, Vancomycin, Warfarin Indication: rule out pneumonia / tachypnea  Allergies  Allergen Reactions  . Sulfonamide Derivatives     REACTION: cramps    Patient Measurements: Height: 5\' 11"  (180.3 cm) Weight: 392 lb 6.7 oz (178 kg) IBW/kg (Calculated) : 75.3   Vital Signs: Temp: 98.3 F (36.8 C) (03/07 0804) Temp src: Axillary (03/07 0804) BP: 122/80 mmHg (03/07 0804) Pulse Rate: 99  (03/07 0804) Intake/Output from previous day: 03/06 0701 - 03/07 0700 In: 3519.5 [P.O.:720; I.V.:1400; IV Piggyback:1399.5] Out: 925 [Urine:900; Blood:25] Intake/Output from this shift:    Labs:  Basename 12/23/11 0505 12/23/11 0500 12/21/11 2342  WBC -- 27.5* 34.9*  HGB -- 8.4* 9.8*  PLT -- 495* 538*  LABCREA 136.67 -- --  CREATININE -- 1.37* 0.81   Estimated Creatinine Clearance: 126.3 ml/min (by C-G formula based on Cr of 1.37).   Microbiology: Recent Results (from the past 720 hour(s))  CULTURE, BLOOD (ROUTINE X 2)     Status: Normal (Preliminary result)   Collection Time   12/21/11 11:46 PM      Component Value Range Status Comment   Specimen Description BLOOD RIGHT ARM   Final    Special Requests BOTTLES DRAWN AEROBIC AND ANAEROBIC 5CC   Final    Culture  Setup Time [id number]   Final    Culture     Final    Value:        BLOOD CULTURE RECEIVED NO GROWTH TO DATE CULTURE WILL BE HELD FOR 5 DAYS BEFORE ISSUING A FINAL NEGATIVE REPORT   Report Status PENDING   Incomplete   CULTURE, BLOOD (ROUTINE X 2)     Status: Normal (Preliminary result)   Collection Time   12/21/11 11:56 PM      Component Value Range Status Comment   Specimen Description BLOOD RIGHT HAND   Final    Special Requests BOTTLES DRAWN AEROBIC AND ANAEROBIC 5CC   Final    Culture  Setup Time [id number]   Final    Culture     Final    Value:        BLOOD CULTURE RECEIVED NO GROWTH TO DATE CULTURE WILL BE HELD FOR 5 DAYS BEFORE ISSUING A FINAL NEGATIVE REPORT   Report Status PENDING   Incomplete   SURGICAL PCR SCREEN     Status: Normal   Collection Time   12/22/11  7:41 AM      Component Value Range Status Comment   MRSA, PCR NEGATIVE  NEGATIVE  Final    Staphylococcus aureus NEGATIVE  NEGATIVE  Final   CULTURE, ROUTINE-ABSCESS     Status: Normal (Preliminary result)   Collection Time   12/22/11  9:22 AM      Component Value Range Status Comment   Specimen Description ABSCESS FOOT LEFT   Final    Special Requests PT ON AUGMENTIN   Final    Gram Stain PENDING   Incomplete    Culture Culture reincubated for better growth   Final    Report Status PENDING   Incomplete      Assessment: 1.  Zosyn and Vancomycin for foot abscess.  Blood cultures are no growth to date.  Abscess culture is pending.  His creatinine increased overnight from 0.81 to 1.37 which is concerning. 2.  Warfarin for VTE prophylaxis - INR 1.27 today after Warfarin 10mg  yesterday.  Goal of Therapy:  Vancomycin trough of 15-20mg /L INR 2-3  Plan:  1) Zosyn  3.375 grams iv Q 8 hours - 4 hour infusion 2) Warfarin 10mg  today 3) Check Vancomycin trough in the morning to verify appropriate dosing 4) follow up cultures and renal function.   Mickeal Skinner 12/23/2011,8:44 AM

## 2011-12-23 NOTE — Consult Note (Signed)
[facility] consult note   Subjective: Endorses back pain and mostly controlled post op pain in left foot. Complains SCD to RLE makes leg feel funny  Objective: Vital signs in last 24 hours: Temp:  [98 F (36.7 C)-100.1 F (37.8 C)] 98.3 F (36.8 C) (03/07 1246) Pulse Rate:  [97-110] 98  (03/07 1246) Resp:  [18-45] 18  (03/07 1246) BP: (111-164)/(62-81) 122/74 mmHg (03/07 1246) SpO2:  [96 %-99 %] 97 % (03/07 1246) Weight:  [174 kg (383 lb 9.6 oz)-178 kg (392 lb 6.7 oz)] 178 kg (392 lb 6.7 oz) (03/07 0512) Weight change: 1.225 kg (2 lb 11.2 oz) Last BM Date: 12/22/11  Intake/Output from previous day: 03/06 0701 - 03/07 0700 In: 3519.5 [P.O.:720; I.V.:1400; IV Piggyback:1399.5] Out: 925 [Urine:900; Blood:25] Intake/Output this shift: Total I/O In: -  Out: 450 [Urine:450]  General appearance: alert, cooperative, appears stated age, no distress and morbidly obese Resp: clear to auscultation bilaterally, nasal cannula oxygen @ 2 liters, tachypneic Cardio: regular rate and rhythm, tachycardic, S1, S2 normal, no murmur, click, rub or gallop, distal pulses 2+ bilaterally GI: soft, non-tender; bowel sounds normal; no masses,  no organomegaly Extremities: extremities normal, atraumatic, no cyanosis or edema Skin: Skin color, texture, turgor normal except for surgical wound right foot covered with dressing- weeping serosanguinous drainage. No rashes or lesions Neurologic: Grossly normal  Lab Results:  Basename 12/23/11 0500 12/21/11 2342  WBC 27.5* 34.9*  HGB 8.4* 9.8*  HCT 25.2* 28.6*  PLT 495* 538*   BMET  Basename 12/23/11 0500 12/21/11 2342  NA 128* 128*  K 3.8 3.6  CL 94* 93*  CO2 24 23  GLUCOSE 241* 265*  BUN 23 21  CREATININE 1.37* 0.81  CALCIUM 7.6* 8.7    Studies/Results: Dg Chest 2 View  12/21/2011  *RADIOLOGY REPORT*  Clinical Data: Foot infection, preoperative assessment  CHEST - 2 VIEW  Comparison: None  Findings: Respiratory motion artifacts  degrade lateral view. Enlargement of cardiac silhouette. Pulmonary vascular congestion. Prominent mediastinum on AP view, question related to AP technique. No gross infiltrate or effusion. Osseous mineralization normal.  IMPRESSION: Enlargement of cardiac silhouette with pulmonary vascular congestion. Limitations of exam as above, without definite acute abnormalities.  Original Report Authenticated By: Lollie Marrow, M.D.   Dg Chest Port 1 View  12/22/2011  *RADIOLOGY REPORT*  Clinical Data: Hypoxia.  PORTABLE CHEST - 1 VIEW  Comparison: Two-view chest x-ray 12/21/2010.  Findings: The heart is enlarged.  A new right-sided PICC line has been placed.  The tip is in the upper SVC, just beyond the confluence.  A new diffuse interstitial and airspace pattern is present on the right.  Mild interstitial disease on the left is unchanged.  IMPRESSION:  1.  Interval placement of a right-sided PICC line as described. 2.  New diffuse interstitial airspace disease in the right lung. This most likely represents edema.  Infection is considered less likely. 3.  Stable interstitial pattern of the left lung. 4.  Stable cardiomegaly.  Original Report Authenticated By: Jamesetta Orleans. MATTERN, M.D.    Medications:  I have reviewed the patient's current medications. Scheduled:   . HYDROmorphone PCA 0.3 mg/mL   Intravenous Q4H  . insulin aspart  0-20 Units Subcutaneous TID WC  . insulin aspart  0-5 Units Subcutaneous QHS  . insulin glargine  5 Units Subcutaneous Daily  . linagliptin  5 mg Oral Daily  . living well with diabetes book   Does not apply Once  . piperacillin-tazobactam (ZOSYN)  IV  3.375 g Intravenous Q8H  . vancomycin  1,500 mg Intravenous Q12H  . warfarin  10 mg Oral ONCE-1800  . warfarin  10 mg Oral ONCE-1800  . warfarin   Does not apply Once  . Warfarin - Pharmacist Dosing Inpatient   Does not apply q1800  . white petrolatum      . DISCONTD: insulin aspart  0-20 Units Subcutaneous Q4H     Assessment/Plan:  Principal Problem:  *SIRS (systemic inflammatory response syndrome)/Leukocytosis *lactic acid and PCT normal, also hemodynamically stable *still has tachycardia and resting tachypnea *will begin aggressive rehydration *treat underlying causes- likely foot abscess *would culture with abundant GPC's- continue empiric Zosyn and Vancomycin *follow up blood cultures  Active Problems:  Foot abscess *managed by Dr. Lynford Humphrey *additional sugery planned for tomorrow   Fever *has defervesced   Hyponatremia/ Volume depletion/Thrombocytosis *combination pseudohyponatremia and volume depletion *rehydrate as above  *follow lytes *control CBG   Acute and chronic respiratory failure with hypoxia/OBSTRUCTIVE SLEEP APNEA *continue daytime nasal cannula oxygen and HS CPAP   DIABETES, TYPE 2 *POORLY controlled *on glucophage at home and given obesity likely very insulin resistant *add Tradjenta and low dose Lantus and SSI *Diabetes coordinator consult   Anemia due to chronic illness *hgb stable    Morbid obesity, BMI unknown *Obtain bariatric bed   DVT prophylaxis *pharmacy managing warfarin  Disposition *Remain in stepdown    LOS: 2 days   Junious Silk, ANP pager [phone]  [facility] 8 [email or web address] Password: [email or web address]  12/23/2011, 4:00 PM  I have personally examined the patient and reviewed the entire database. Agree with the above note and plan as outlined by Ms. Rennis Harding.  Kyndle Schlender M.D. [facility] 12/23/2011, 4:44 PM

## 2011-12-23 NOTE — Progress Notes (Signed)
Utilization review completed. Jancarlos Thrun, RN, BSN. 12/23/11 

## 2011-12-24 ENCOUNTER — Inpatient Hospital Stay (HOSPITAL_COMMUNITY): Payer: PRIVATE HEALTH INSURANCE | Admitting: Anesthesiology

## 2011-12-24 ENCOUNTER — Encounter (HOSPITAL_COMMUNITY)
Admission: AD | Disposition: A | Discharge status: Home Health | Payer: Self-pay | Source: Ambulatory Visit | Attending: Orthopedic Surgery

## 2011-12-24 ENCOUNTER — Encounter (HOSPITAL_COMMUNITY): Payer: Self-pay | Admitting: Anesthesiology

## 2011-12-24 HISTORY — PX: I&D EXTREMITY: SHX5045

## 2011-12-24 LAB — BASIC METABOLIC PANEL
BUN: 21 mg/dL (ref 6–23)
Calcium: 8.2 mg/dL — ABNORMAL LOW (ref 8.4–10.5)
Chloride: 98 mEq/L (ref 96–112)
Creatinine, Ser: 1.27 mg/dL (ref 0.50–1.35)
GFR calc Af Amer: 85 mL/min — ABNORMAL LOW (ref >90)
GFR calc non Af Amer: 73 mL/min — ABNORMAL LOW (ref >90)

## 2011-12-24 LAB — CBC
HCT: 25.2 % — ABNORMAL LOW (ref 39.0–52.0)
MCHC: 32.9 g/dL (ref 30.0–36.0)
Platelets: 478 10*3/uL — ABNORMAL HIGH (ref 150–400)
RDW: 13.1 % (ref 11.5–15.5)
WBC: 24.5 10*3/uL — ABNORMAL HIGH (ref 4.0–10.5)

## 2011-12-24 LAB — GLUCOSE, CAPILLARY

## 2011-12-24 LAB — URINE CULTURE: Culture: NO GROWTH

## 2011-12-24 LAB — PROTIME-INR
INR: 1.32 (ref 0.00–1.49)
Prothrombin Time: 16.6 seconds — ABNORMAL HIGH (ref 11.6–15.2)

## 2011-12-24 LAB — VANCOMYCIN, TROUGH: Vancomycin Tr: 25.9 ug/mL (ref 10.0–20.0)

## 2011-12-24 SURGERY — IRRIGATION AND DEBRIDEMENT EXTREMITY
Anesthesia: General | Site: Foot | Laterality: Left | Wound class: Dirty or Infected

## 2011-12-24 MED ORDER — LIDOCAINE HCL (CARDIAC) 20 MG/ML IV SOLN: INTRAVENOUS | Status: DC | PRN

## 2011-12-24 MED ORDER — PHENOL 1.4 % MT LIQD: 1.0000 | OROMUCOSAL | Status: DC | PRN

## 2011-12-24 MED ORDER — ONDANSETRON HCL 4 MG/2ML IJ SOLN: 4.0000 mg | Freq: Once | INTRAMUSCULAR | Status: DC | PRN

## 2011-12-24 MED ORDER — PROPOFOL 10 MG/ML IV EMUL
INTRAVENOUS | Status: DC | PRN
  Administered 2011-12-24: 200 mg via INTRAVENOUS

## 2011-12-24 MED ORDER — WARFARIN SODIUM 10 MG PO TABS
10.0000 mg | ORAL_TABLET | Freq: Once | ORAL | Status: AC
  Administered 2011-12-24: 10 mg via ORAL
  Filled 2011-12-24: qty 1

## 2011-12-24 MED ORDER — FENTANYL CITRATE 0.05 MG/ML IJ SOLN
INTRAMUSCULAR | Status: DC | PRN
  Administered 2011-12-24: 125 ug via INTRAVENOUS

## 2011-12-24 MED ORDER — PIPERACILLIN-TAZOBACTAM 3.375 G IVPB 30 MIN
INTRAVENOUS | Status: DC | PRN
  Administered 2011-12-24: 3.375 g via INTRAVENOUS

## 2011-12-24 MED ORDER — LACTATED RINGERS IV SOLN: INTRAVENOUS | Status: DC | PRN

## 2011-12-24 MED ORDER — PIPERACILLIN-TAZOBACTAM 3.375 G IVPB
3.3750 g | INTRAVENOUS | Status: DC
  Filled 2011-12-24 (×2): qty 50

## 2011-12-24 MED ORDER — SODIUM CHLORIDE 0.9 % IR SOLN
Status: DC | PRN
  Administered 2011-12-24: 3000 mL

## 2011-12-24 MED ORDER — SODIUM CHLORIDE 0.9 % IV SOLN
INTRAVENOUS | Status: DC | PRN
  Administered 2011-12-24: 08:00:00 via INTRAVENOUS

## 2011-12-24 MED ORDER — INSULIN GLARGINE 100 UNIT/ML ~~LOC~~ SOLN
10.0000 [IU] | Freq: Once | SUBCUTANEOUS | Status: AC
  Administered 2011-12-24: 10 [IU] via SUBCUTANEOUS

## 2011-12-24 MED ORDER — MORPHINE SULFATE 4 MG/ML IJ SOLN: 0.0500 mg/kg | INTRAMUSCULAR | Status: DC | PRN

## 2011-12-24 MED ORDER — 0.9 % SODIUM CHLORIDE (POUR BTL) OPTIME
TOPICAL | Status: DC | PRN
  Administered 2011-12-24: 1000 mL

## 2011-12-24 MED ORDER — POVIDONE-IODINE 10 % EX SOLN
CUTANEOUS | Status: DC | PRN
  Administered 2011-12-24: 1 via TOPICAL

## 2011-12-24 MED ORDER — INSULIN GLARGINE 100 UNIT/ML ~~LOC~~ SOLN
15.0000 [IU] | Freq: Every day | SUBCUTANEOUS | Status: DC
  Administered 2011-12-25: 15 [IU] via SUBCUTANEOUS
  Filled 2011-12-24: qty 3

## 2011-12-24 MED ORDER — SUCCINYLCHOLINE CHLORIDE 20 MG/ML IJ SOLN
INTRAMUSCULAR | Status: DC | PRN
  Administered 2011-12-24: 160 mg via INTRAVENOUS

## 2011-12-24 MED ORDER — HYDROMORPHONE HCL PF 1 MG/ML IJ SOLN: 0.2500 mg | INTRAMUSCULAR | Status: DC | PRN

## 2011-12-24 SURGICAL SUPPLY — 43 items
BANDAGE ELASTIC 3 VELCRO ST LF (GAUZE/BANDAGES/DRESSINGS) IMPLANT
BANDAGE ELASTIC 4 VELCRO ST LF (GAUZE/BANDAGES/DRESSINGS) IMPLANT
BANDAGE ELASTIC 6 VELCRO ST LF (GAUZE/BANDAGES/DRESSINGS) ×2 IMPLANT
BANDAGE GAUZE ELAST BULKY 4 IN (GAUZE/BANDAGES/DRESSINGS) ×6 IMPLANT
BLADE SURG 10 STRL SS (BLADE) IMPLANT
BNDG COHESIVE 4X5 TAN STRL (GAUZE/BANDAGES/DRESSINGS) IMPLANT
CLOTH BEACON ORANGE TIMEOUT ST (SAFETY) ×2 IMPLANT
CUFF TOURNIQUET SINGLE 18IN (TOURNIQUET CUFF) IMPLANT
CUFF TOURNIQUET SINGLE 24IN (TOURNIQUET CUFF) IMPLANT
DRAPE U-SHAPE 47X51 STRL (DRAPES) ×2 IMPLANT
DRSG ADAPTIC 3X8 NADH LF (GAUZE/BANDAGES/DRESSINGS) IMPLANT
DRSG EMULSION OIL 3X3 NADH (GAUZE/BANDAGES/DRESSINGS) IMPLANT
DRSG PAD ABDOMINAL 8X10 ST (GAUZE/BANDAGES/DRESSINGS) ×4 IMPLANT
DURAPREP 26ML APPLICATOR (WOUND CARE) IMPLANT
ELECT REM PT RETURN 9FT ADLT (ELECTROSURGICAL) ×2
ELECTRODE REM PT RTRN 9FT ADLT (ELECTROSURGICAL) ×1 IMPLANT
GAUZE XEROFORM 5X9 LF (GAUZE/BANDAGES/DRESSINGS) ×2 IMPLANT
GLOVE BIO SURGEON STRL SZ7 (GLOVE) ×2 IMPLANT
GLOVE BIOGEL PI IND STRL 7.0 (GLOVE) ×1 IMPLANT
GLOVE BIOGEL PI INDICATOR 7.0 (GLOVE) ×1
GLOVE SS PI 9.0 STRL (GLOVE) ×2 IMPLANT
GOWN PREVENTION PLUS XLARGE (GOWN DISPOSABLE) ×2 IMPLANT
GOWN STRL NON-REIN LRG LVL3 (GOWN DISPOSABLE) ×2 IMPLANT
HANDPIECE INTERPULSE COAX TIP (DISPOSABLE)
KIT BASIN OR (CUSTOM PROCEDURE TRAY) ×2 IMPLANT
KIT ROOM TURNOVER OR (KITS) ×2 IMPLANT
MANIFOLD NEPTUNE II (INSTRUMENTS) IMPLANT
NS IRRIG 1000ML POUR BTL (IV SOLUTION) ×2 IMPLANT
PACK ORTHO EXTREMITY (CUSTOM PROCEDURE TRAY) ×2 IMPLANT
PAD ARMBOARD 7.5X6 YLW CONV (MISCELLANEOUS) ×2 IMPLANT
SET HNDPC FAN SPRY TIP SCT (DISPOSABLE) IMPLANT
SPONGE GAUZE 4X4 12PLY (GAUZE/BANDAGES/DRESSINGS) IMPLANT
SPONGE LAP 18X18 X RAY DECT (DISPOSABLE) ×2 IMPLANT
SPONGE LAP 4X18 X RAY DECT (DISPOSABLE) IMPLANT
STOCKINETTE IMPERVIOUS 9X36 MD (GAUZE/BANDAGES/DRESSINGS) ×2 IMPLANT
TOWEL OR 17X24 6PK STRL BLUE (TOWEL DISPOSABLE) IMPLANT
TOWEL OR 17X26 10 PK STRL BLUE (TOWEL DISPOSABLE) ×2 IMPLANT
TOWEL OR NON WOVEN STRL DISP B (DISPOSABLE) ×2 IMPLANT
TUBE ANAEROBIC SPECIMEN COL (MISCELLANEOUS) IMPLANT
TUBE CONNECTING 12X1/4 (SUCTIONS) ×2 IMPLANT
UNDERPAD 30X30 INCONTINENT (UNDERPADS AND DIAPERS) ×2 IMPLANT
WATER STERILE IRR 1000ML POUR (IV SOLUTION) IMPLANT
YANKAUER SUCT BULB TIP NO VENT (SUCTIONS) ×2 IMPLANT

## 2011-12-24 NOTE — Anesthesia Postprocedure Evaluation (Signed)
  Anesthesia Post-op Note  Patient: Adrian Khan  Procedure(s) Performed: Procedure(s) (LRB): IRRIGATION AND DEBRIDEMENT EXTREMITY (Left)  Patient Location: PACU  Anesthesia Type: General  Level of Consciousness: awake, alert  and oriented  Airway and Oxygen Therapy: Patient Spontanous Breathing and Patient connected to nasal cannula oxygen  Post-op Pain: mild  Post-op Assessment: Post-op Vital signs reviewed, Patient's Cardiovascular Status Stable, Respiratory Function Stable, Patent Airway, No signs of Nausea or vomiting and Pain level controlled  Post-op Vital Signs: Reviewed and stable  Complications: No apparent anesthesia complications

## 2011-12-24 NOTE — Consult Note (Signed)
[facility] - TEAM 1 - STEPDOWN/ICU TEAM CONSULT F/U NOTE  Subjective: Just returned from surgery. Coughing. Mother in room. Denies fever, chills, sob, n/v, or abdom pain.    Objective: Blood pressure 143/88, pulse 105, temperature 98.4 F (36.9 C), temperature source Oral, resp. rate 34, height 5\' 11"  (1.803 m), weight 174.3 kg (384 lb 4.2 oz), SpO2 97.00%.  Intake/Output from previous day: 03/07 0701 - 03/08 0700 In: 2796.7 [P.O.:480; I.V.:2266.7; IV Piggyback:50] Out: 1400 [Urine:1400] Intake/Output this shift: Total I/O In: 940 [P.O.:490; I.V.:450] Out: 525 [Urine:475; Blood:50]  General appearance: alert, cooperative, appears stated age, no distress and morbidly obese Resp: clear to auscultation bilaterally, nasal cannula oxygen @ 2 liters Cardio: regular rate and rhythm, tachycardic, S1, S2 normal, no murmur, click, rub or gallop GI: soft, non-tender; bowel sounds normal; no masses,  no organomegaly Extremities: L foot dressed and dry  Neurologic: Grossly normal  Lab Results:  Basename 12/24/11 0541 12/23/11 0500  WBC 24.5* 27.5*  HGB 8.3* 8.4*  HCT 25.2* 25.2*  PLT 478* 495*   BMET  Basename 12/24/11 0541 12/23/11 0500  NA 131* 128*  K 3.8 3.8  CL 98 94*  CO2 23 24  GLUCOSE 234* 241*  BUN 21 23  CREATININE 1.27 1.37*  CALCIUM 8.2* 7.6*    Studies/Results: Dg Chest Port 1 View  12/22/2011  *RADIOLOGY REPORT*  Clinical Data: Hypoxia.  PORTABLE CHEST - 1 VIEW  Comparison: Two-view chest x-ray 12/21/2010.  Findings: The heart is enlarged.  A new right-sided PICC line has been placed.  The tip is in the upper SVC, just beyond the confluence.  A new diffuse interstitial and airspace pattern is present on the right.  Mild interstitial disease on the left is unchanged.  IMPRESSION:  1.  Interval placement of a right-sided PICC line as described. 2.  New diffuse interstitial airspace disease in the right lung. This most likely represents edema.  Infection is  considered less likely. 3.  Stable interstitial pattern of the left lung. 4.  Stable cardiomegaly.  Original Report Authenticated By: Jamesetta Orleans. MATTERN, M.D.    Medications:  I have reviewed the patient's current medications.  Assessment/Plan:   *SIRS (systemic inflammatory response syndrome)/Leukocytosis *lactic acid and PCT normal, also hemodynamically stable *still has tachycardia and resting tachypnea *will begin aggressive rehydration  *treat underlying causes- likely foot abscess *would culture with abundant GPC's- continue empiric Zosyn and Vancomycin *follow up blood cultures   Foot abscess/diabetic foot wound *managed by Dr. Lynford Humphrey *additional sugery today 3/8   Fever *has defervesced   Hyponatremia/ Volume depletion/Thrombocytosis *combination pseudohyponatremia and volume depletion *sodium increasing with rehydration *follow lytes *control CBG   Acute and chronic respiratory failure with hypoxia/OBSTRUCTIVE SLEEP APNEA *continue daytime nasal cannula oxygen and QHS CPAP *ECHO with poor acoustic windows so unable to evaluate for diastolic dysfunction. Right ventricle mildly dilated. Otherwise normal systolic function.   DIABETES, TYPE 2 * POORLY controlled * Was on Januvia 100mg  BID at home so will resume here. * Pharmacy with therapeutic substitution of Januvia with Tradjenta daily * Low dose Lantus added 3/7- CBG still up so will increase Lantus * Continue SSI * Diabetes coordinator consult   Anemia due to chronic illness *hgb stable   Morbid obesity, BMI unknown *Obtain bariatric bed   DVT prophylaxis *pharmacy managing warfarin  Disposition *per primary team - we will continue to follow along with you   LOS: 3 days   Junious Silk, ANP pager [phone]  [facility] 1  [email or web address] Password: [email or web address]  12/24/2011, 1:05 PM  I have personally examined this patient and reviewed the entire database. I have reviewed the above  note, made any necessary editorial changes, and agree with its content.  Lonia Blood, MD [facility]

## 2011-12-24 NOTE — Transfer of Care (Signed)
Immediate Anesthesia Transfer of Care Note  Patient: Adrian Khan  Procedure(s) Performed: Procedure(s) (LRB): IRRIGATION AND DEBRIDEMENT EXTREMITY (Left)  Patient Location: PACU  Anesthesia Type: General  Level of Consciousness: awake, alert , oriented and patient cooperative  Airway & Oxygen Therapy: Patient Spontanous Breathing and Patient connected to nasal cannula oxygen  Post-op Assessment: Report given to PACU RN and Post -op Vital signs reviewed and stable  Post vital signs: Reviewed and stable  Complications: No apparent anesthesia complications

## 2011-12-24 NOTE — Brief Op Note (Signed)
12/21/2011 - 12/24/2011  8:49 AM  PATIENT:  Adrian Khan  36 y.o. male  PRE-OPERATIVE DIAGNOSIS:  Infection of left foot  POST-OPERATIVE DIAGNOSIS:  Infection of left foot  PROCEDURE:  Procedure(s) (LRB): IRRIGATION AND DEBRIDEMENT EXTREMITY (Left)  SURGEON:  Surgeon(s) and Role:    * Kennieth Rad, MD - Primary  PHYSICIAN ASSISTANT:   ASSISTANTS: none   ANESTHESIA:   general  EBL:  Total I/O In: -  Out: 50 [Blood:50]  BLOOD ADMINISTERED:none  DRAINS: none   LOCAL MEDICATIONS USED:  NONE  SPECIMEN:  No Specimen  DISPOSITION OF SPECIMEN:  N/A  COUNTS:  YES  TOURNIQUET:  * No tourniquets in log *  DICTATION: .Other Dictation: Dictation Number [id number]  PLAN OF CARE: Admit to inpatient   PATIENT DISPOSITION:  PACU - hemodynamically stable.   Delay start of Pharmacological VTE agent (>24hrs) due to surgical blood loss or risk of bleeding: yes

## 2011-12-24 NOTE — Op Note (Signed)
NAME:  SALAAM, BATTERSHELL NO.:  MEDICAL RECORD NO.:  [id number]  LOCATION:                                 FACILITY:  PHYSICIAN:  Myrtie Neither, MD      DATE OF BIRTH:  [birth year]  DATE OF PROCEDURE:  12/24/2011 DATE OF DISCHARGE:                              OPERATIVE REPORT   PREOPERATIVE DIAGNOSIS:  Open wound, infected left diabetic foot.  POSTOP DIAGNOSIS:  Open wound, infected left diabetic foot.  ANESTHESIA:  General.  PROCEDURE:  Irrigation and debridement, left foot.  Packing of left foot with Betadine soaks.  PROCEDURE IN DETAIL:  The patient was taken to the operating room. After given adequate preop medications, given general anesthesia and intubated.  Tourniquet was not used.  Left foot was scrubbed with Betadine scrub and paint with Betadine solution, draped in sterile manner.  With the use of curette, 2 dorsal incisions in the wound areas were further debrided.  There was still foul odor purulent material over the metatarsal heads.  This was thoroughly debrided with a curette and then sharp dissection as well as with Simpulse irrigator.  The plantar wound across the metatarsal head had minimal purulent material, but the open wound over the great joint, there was some again purulent material which was debrided.  Copious irrigation again was done, then packing was placed in both dorsally and plantar aspect, 2 packings dorsally and 1 packing inside plantar aspect and 1 exterior packing with Betadine. Xeroform gauze was placed to the skin.  Compressive dressing was applied.  The patient tolerated the procedure quite well, went to recovery room in stable and satisfactory condition.     Myrtie Neither, MD     AC/MEDQ  D:  12/24/2011  T:  12/24/2011  Job:  [id number]

## 2011-12-24 NOTE — Progress Notes (Signed)
12/24/11  Spoke with patient about his diabetes.  Was diagnosed at age 36.  Is followed by Dr. Bascom Levels.  Has always been on oral medication and takes Januvia 100 mg twice a day.  States that he checks CBGs with meter twice a day.  Usually runs around 119-150 mg/dl before infection.  States that he rarely has blood sugars that register below 70 or above 400 mg/dl.  Seems very concerned about his infection and feet.  Will have staff nurse to give him information on foot care and general info on diabetes. Suggested that he watch DM videos while here in hospital.  States that he is not quite up to that yet.  Spoke with nurse about him.  Will continue to follow while in hospital.  Smith Mince RN BSN

## 2011-12-24 NOTE — Progress Notes (Signed)
Pharmacy CONSULT NOTE   Pharmacy Consult for Zosyn, Vancomycin, Warfarin Indication: rule out pneumonia / tachypnea  Allergies  Allergen Reactions  . Sulfonamide Derivatives     REACTION: cramps    Patient Measurements: Height: 5\' 11"  (180.3 cm) Weight: 384 lb 4.2 oz (174.3 kg) IBW/kg (Calculated) : 75.3   Vital Signs: Temp: 99.3 F (37.4 C) (03/08 0942) Temp src: Oral (03/08 0500) BP: 151/99 mmHg (03/08 0942) Pulse Rate: 103  (03/08 0915) Intake/Output from previous day: 03/07 0701 - 03/08 0700 In: 2796.7 [P.O.:480; I.V.:2266.7; IV Piggyback:50] Out: 1400 [Urine:1400] Intake/Output from this shift: Total I/O In: 450 [I.V.:450] Out: 50 [Blood:50]  Labs:  [facility] 12/24/11 0541 12/23/11 0505 12/23/11 0500 12/21/11 2342  WBC 24.5* -- 27.5* 34.9*  HGB 8.3* -- 8.4* 9.8*  PLT 478* -- 495* 538*  LABCREA -- 136.67 -- --  CREATININE 1.27 -- 1.37* 0.81   Estimated Creatinine Clearance: 134.5 ml/min (by C-G formula based on Cr of 1.27).   Microbiology: Recent Results (from the past 720 hour(s))  CULTURE, BLOOD (ROUTINE X 2)     Status: Normal (Preliminary result)   Collection Time   12/21/11 11:46 PM      Component Value Range Status Comment   Specimen Description BLOOD RIGHT ARM   Final    Special Requests BOTTLES DRAWN AEROBIC AND ANAEROBIC 5CC   Final    Culture  Setup Time [id number]   Final    Culture     Final    Value:        BLOOD CULTURE RECEIVED NO GROWTH TO DATE CULTURE WILL BE HELD FOR 5 DAYS BEFORE ISSUING A FINAL NEGATIVE REPORT   Report Status PENDING   Incomplete   CULTURE, BLOOD (ROUTINE X 2)     Status: Normal (Preliminary result)   Collection Time   12/21/11 11:56 PM      Component Value Range Status Comment   Specimen Description BLOOD RIGHT HAND   Final    Special Requests BOTTLES DRAWN AEROBIC AND ANAEROBIC 5CC   Final    Culture  Setup Time [id number]   Final    Culture     Final    Value:        BLOOD CULTURE RECEIVED NO GROWTH TO DATE  CULTURE WILL BE HELD FOR 5 DAYS BEFORE ISSUING A FINAL NEGATIVE REPORT   Report Status PENDING   Incomplete   SURGICAL PCR SCREEN     Status: Normal   Collection Time   12/22/11  7:41 AM      Component Value Range Status Comment   MRSA, PCR NEGATIVE  NEGATIVE  Final    Staphylococcus aureus NEGATIVE  NEGATIVE  Final   ANAEROBIC CULTURE     Status: Normal (Preliminary result)   Collection Time   12/22/11  9:22 AM      Component Value Range Status Comment   Specimen Description ABSCESS FOOT LEFT   Final    Special Requests PT ON AUGMENTIN   Final    Gram Stain     Final    Value: FEW WBC PRESENT,BOTH PMN AND MONONUCLEAR     NO SQUAMOUS EPITHELIAL CELLS SEEN     ABUNDANT GRAM POSITIVE COCCI     IN PAIRS ABUNDANT GRAM VARIABLE ROD   Culture     Final    Value: NO ANAEROBES ISOLATED; CULTURE IN PROGRESS FOR 5 DAYS   Report Status PENDING   Incomplete   CULTURE, ROUTINE-ABSCESS     Status: Normal (  Preliminary result)   Collection Time   12/22/11  9:22 AM      Component Value Range Status Comment   Specimen Description ABSCESS FOOT LEFT   Final    Special Requests PT ON AUGMENTIN   Final    Gram Stain     Final    Value: FEW WBC PRESENT,BOTH PMN AND MONONUCLEAR     NO SQUAMOUS EPITHELIAL CELLS SEEN     ABUNDANT GRAM POSITIVE COCCI     IN PAIRS ABUNDANT GRAM VARIABLE ROD   Culture ABUNDANT GRAM NEGATIVE RODS   Final    Report Status PENDING   Incomplete   URINE CULTURE     Status: Normal   Collection Time   12/23/11  5:05 AM      Component Value Range Status Comment   Specimen Description URINE, CLEAN CATCH   Final    Special Requests NONE   Final    Culture  Setup Time [id number]   Final    Colony Count NO GROWTH   Final    Culture NO GROWTH   Final    Report Status 12/24/2011 FINAL   Final      Assessment: 1.  Zosyn and Vancomycin for foot abscess.  Blood cultures are no growth to date.  Abscess culture is growing a gram negative rod, ID and sensitivity pending.  His creatinine  decreased overnight from 1.3 to 1.27 which is encouraging. 2.  Warfarin for VTE prophylaxis - INR 1.32 today after Warfarin 10mg  3/6 and 3/7.  Goal of Therapy:  Vancomycin trough of 15-20mg /L INR 2-3  Plan:  1) Zosyn 3.375 grams iv Q 8 hours - 4 hour infusion 2) Warfarin 10mg  today 3) Check Vancomycin trough in the morning to verify appropriate dosing and know when to redose and at what interval. 4) follow up cultures and renal function.   Mickeal Skinner 12/24/2011,9:52 AM

## 2011-12-24 NOTE — Anesthesia Preprocedure Evaluation (Addendum)
Anesthesia Evaluation  Patient identified by MRN, date of birth, ID band Patient awake    Reviewed: Allergy & Precautions, H&P , NPO status , Patient's Chart, lab work & pertinent test results  Airway Mallampati: II TM Distance: >3 FB Neck ROM: Full    Dental  (+) Teeth Intact and Dental Advisory Given   Pulmonary sleep apnea and Continuous Positive Airway Pressure Ventilation ,  breath sounds clear to auscultation  Pulmonary exam normal       Cardiovascular negative cardio ROS  Rhythm:Regular Rate:Normal     Neuro/Psych    GI/Hepatic Neg liver ROS, GERD-  ,  Endo/Other  Diabetes mellitus-, Type 2, Oral Hypoglycemic Agents  Renal/GU   negative genitourinary   Musculoskeletal negative musculoskeletal ROS (+)   Abdominal   Peds  Hematology negative hematology ROS (+)   Anesthesia Other Findings   Reproductive/Obstetrics                         Anesthesia Physical Anesthesia Plan  ASA: IV  Anesthesia Plan: General   Post-op Pain Management:    Induction: Intravenous  Airway Management Planned: Oral ETT  Additional Equipment:   Intra-op Plan:   Post-operative Plan: Extubation in OR  Informed Consent: I have reviewed the patients History and Physical, chart, labs and discussed the procedure including the risks, benefits and alternatives for the proposed anesthesia with the patient or authorized representative who has indicated his/her understanding and acceptance.   Dental advisory given  Plan Discussed with: CRNA, Anesthesiologist and Surgeon  Anesthesia Plan Comments:        Anesthesia Quick Evaluation

## 2011-12-24 NOTE — Preoperative (Signed)
Beta Blockers   Reason not to administer Beta Blockers:Not Applicable 

## 2011-12-24 NOTE — Progress Notes (Signed)
CRITICAL VALUE ALERT  Critical value received:  Vanc trough 25.9  Date of notification:  12/24/2011  Time of notification:  0615  Critical value read back:yes  Nurse who received alert:  Lillia Corporal   MD notified (1st page):  Dr. Claiborne Billings  Time of first page:  0625  MD notified (2nd page):  Time of second page:  Responding MD:  Dr. Claiborne Billings  Time MD responded:  [phone]  Pharmacy called and notified to adjust Vancomycin doseaging.

## 2011-12-25 LAB — GLUCOSE, CAPILLARY
Glucose-Capillary: 237 mg/dL — ABNORMAL HIGH (ref 70–99)
Glucose-Capillary: 244 mg/dL — ABNORMAL HIGH (ref 70–99)

## 2011-12-25 LAB — PROTIME-INR
INR: 1.49 (ref 0.00–1.49)
Prothrombin Time: 18.3 seconds — ABNORMAL HIGH (ref 11.6–15.2)

## 2011-12-25 LAB — CULTURE, ROUTINE-ABSCESS

## 2011-12-25 MED ORDER — WARFARIN SODIUM 10 MG PO TABS
10.0000 mg | ORAL_TABLET | Freq: Once | ORAL | Status: AC
  Administered 2011-12-25: 10 mg via ORAL
  Filled 2011-12-25: qty 1

## 2011-12-25 MED ORDER — INSULIN ASPART 100 UNIT/ML ~~LOC~~ SOLN
8.0000 [IU] | Freq: Three times a day (TID) | SUBCUTANEOUS | Status: DC
  Administered 2011-12-25 – 2011-12-26 (×3): 8 [IU] via SUBCUTANEOUS

## 2011-12-25 MED ORDER — HYDROMORPHONE 0.3 MG/ML IV SOLN
INTRAVENOUS | Status: AC
  Administered 2011-12-25: 0.9 mg
  Filled 2011-12-25: qty 25

## 2011-12-25 MED ORDER — INSULIN GLARGINE 100 UNIT/ML ~~LOC~~ SOLN
15.0000 [IU] | Freq: Two times a day (BID) | SUBCUTANEOUS | Status: DC
  Administered 2011-12-25: 15 [IU] via SUBCUTANEOUS

## 2011-12-25 MED ORDER — VANCOMYCIN HCL 1000 MG IV SOLR
1500.0000 mg | INTRAVENOUS | Status: DC
  Administered 2011-12-25 – 2011-12-28 (×4): 1500 mg via INTRAVENOUS
  Filled 2011-12-25 (×4): qty 1500

## 2011-12-25 NOTE — Progress Notes (Signed)
ANTIBIOTIC/ANTICOAGULATION CONSULT NOTE - FOLLOW UP  Pharmacy Consult for vancomycin/coumadin Indication: foot abscess/leukocytosis and vte prophylaxis    Assessment: Adrian Khan is a 36 year old male on Day 4 vanc/zosyn for foot abscess. Blood cultures are NGTD. Abscess culture growing gram negative rods, speciation pending. Patient urine function at 0.42ml/kg/hr but is net positive. Tmax 100.4 and WBC on 3/8 24.5.   Vancomycin level this morning after holding on 3/8 is 12.4 (level the morning of 3/8 was 25.9).   Patient also on coumadin for vte prophylaxis and INR today is 1.49 after 3 doses of 10mg . No bleeding reported in notes and CBC appears stable.   Goal of Therapy:  Vancomycin trough level 15-20 mcg/ml INR 2-3  Plan:  1. Change vancomycin to 1500mg  q24 hours. Follow patient closely for improved renal function and need for change in dose. Will obtain troughs when necessary.  2. Coumadin 10mg  po x 1 tonight and f/u with INR in the morning.  3. Continue zosyn at extended infusion  Thank you,  Brett Fairy, PharmD Pager: [phone]  12/25/2011 7:53 AM     Allergies  Allergen Reactions  . Sulfonamide Derivatives     REACTION: cramps    Patient Measurements: Height: 5\' 11"  (180.3 cm) Weight: 384 lb 14.8 oz (174.6 kg) IBW/kg (Calculated) : 75.3   Vital Signs: Temp: 98.4 F (36.9 C) (03/09 0400) Temp src: Oral (03/09 0400) BP: 130/84 mmHg (03/09 0400) Pulse Rate: 99  (03/09 0400) Intake/Output from previous day: 03/08 0701 - 03/09 0700 In: 4875.8 [P.O.:1680; I.V.:2943.8; IV Piggyback:152] Out: 2175 [Urine:2125; Blood:50] Intake/Output from this shift:    Labs:  Basename 12/24/11 0541 12/23/11 0505 12/23/11 0500  WBC 24.5* -- 27.5*  HGB 8.3* -- 8.4*  PLT 478* -- 495*  LABCREA -- 136.67 --  CREATININE 1.27 -- 1.37*   Estimated Creatinine Clearance: 134.6 ml/min (by C-G formula based on Cr of 1.27).  Basename 12/25/11 0532 12/24/11 0541  VANCOTROUGH 12.7  25.9*  VANCOPEAK -- --  Drue Dun -- --  GENTTROUGH -- --  GENTPEAK -- --  GENTRANDOM -- --  TOBRATROUGH -- --  TOBRAPEAK -- --  TOBRARND -- --  AMIKACINPEAK -- --  AMIKACINTROU -- --  AMIKACIN -- --     Microbiology: Recent Results (from the past 720 hour(s))  CULTURE, BLOOD (ROUTINE X 2)     Status: Normal (Preliminary result)   Collection Time   12/21/11 11:46 PM      Component Value Range Status Comment   Specimen Description BLOOD RIGHT ARM   Final    Special Requests BOTTLES DRAWN AEROBIC AND ANAEROBIC 5CC   Final    Culture  Setup Time [id number]   Final    Culture     Final    Value:        BLOOD CULTURE RECEIVED NO GROWTH TO DATE CULTURE WILL BE HELD FOR 5 DAYS BEFORE ISSUING A FINAL NEGATIVE REPORT   Report Status PENDING   Incomplete   CULTURE, BLOOD (ROUTINE X 2)     Status: Normal (Preliminary result)   Collection Time   12/21/11 11:56 PM      Component Value Range Status Comment   Specimen Description BLOOD RIGHT HAND   Final    Special Requests BOTTLES DRAWN AEROBIC AND ANAEROBIC [facility]   Final    Culture  Setup Time [id number]   Final    Culture     Final    Value:  BLOOD CULTURE RECEIVED NO GROWTH TO DATE CULTURE WILL BE HELD FOR 5 DAYS BEFORE ISSUING A FINAL NEGATIVE REPORT   Report Status PENDING   Incomplete   SURGICAL PCR SCREEN     Status: Normal   Collection Time   12/22/11  7:41 AM      Component Value Range Status Comment   MRSA, PCR NEGATIVE  NEGATIVE  Final    Staphylococcus aureus NEGATIVE  NEGATIVE  Final   ANAEROBIC CULTURE     Status: Normal (Preliminary result)   Collection Time   12/22/11  9:22 AM      Component Value Range Status Comment   Specimen Description ABSCESS FOOT LEFT   Final    Special Requests PT ON AUGMENTIN   Final    Gram Stain     Final    Value: FEW WBC PRESENT,BOTH PMN AND MONONUCLEAR     NO SQUAMOUS EPITHELIAL CELLS SEEN     ABUNDANT GRAM POSITIVE COCCI     IN PAIRS ABUNDANT GRAM VARIABLE ROD   Culture      Final    Value: NO ANAEROBES ISOLATED; CULTURE IN PROGRESS FOR 5 DAYS   Report Status PENDING   Incomplete   CULTURE, ROUTINE-ABSCESS     Status: Normal (Preliminary result)   Collection Time   12/22/11  9:22 AM      Component Value Range Status Comment   Specimen Description ABSCESS FOOT LEFT   Final    Special Requests PT ON AUGMENTIN   Final    Gram Stain     Final    Value: FEW WBC PRESENT,BOTH PMN AND MONONUCLEAR     NO SQUAMOUS EPITHELIAL CELLS SEEN     ABUNDANT GRAM POSITIVE COCCI     IN PAIRS ABUNDANT GRAM VARIABLE ROD   Culture ABUNDANT GRAM NEGATIVE RODS   Final    Report Status PENDING   Incomplete   URINE CULTURE     Status: Normal   Collection Time   12/23/11  5:05 AM      Component Value Range Status Comment   Specimen Description URINE, CLEAN CATCH   Final    Special Requests NONE   Final    Culture  Setup Time [id number]   Final    Colony Count NO GROWTH   Final    Culture NO GROWTH   Final    Report Status 12/24/2011 FINAL   Final     Anti-infectives     Start     Dose/Rate Route Frequency Ordered Stop   12/24/11 0800   piperacillin-tazobactam (ZOSYN) IVPB 3.375 g  Status:  Discontinued        3.375 g 12.5 mL/hr over 240 Minutes Intravenous To Surgery 12/24/11 0728 12/24/11 0939   12/22/11 1830   vancomycin (VANCOCIN) 1,500 mg in sodium chloride 0.9 % 500 mL IVPB        1,500 mg 250 mL/hr over 120 Minutes Intravenous Every 12 hours 12/22/11 1822 12/23/11 1930   12/22/11 1700  piperacillin-tazobactam (ZOSYN) IVPB 3.375 g       3.375 g 12.5 mL/hr over 240 Minutes Intravenous Every 8 hours 12/22/11 1601     12/22/11 1330   vancomycin (VANCOCIN) IVPB 1000 mg/200 mL premix        1,000 mg 200 mL/hr over 60 Minutes Intravenous Every 12 hours 12/22/11 1228 12/22/11 1510          Marilyne Haseley N 12/25/2011,7:45 AM

## 2011-12-25 NOTE — Consult Note (Signed)
[facility] - TEAM 1 - STEPDOWN/ICU TEAM CONSULT F/U NOTE  Subjective: Pt is resting comfortably in bed.  He c/o occasional pain in his foot.  He denies cp, sob, n/v, or abdom pain.    Objective: Blood pressure 137/94, pulse 92, temperature 98.3 F (36.8 C), temperature source Oral, resp. rate 19, height 5\' 11"  (1.803 m), weight 174.6 kg (384 lb 14.8 oz), SpO2 100.00%.  Intake/Output from previous day: 03/08 0701 - 03/09 0700 In: 5000.8 [P.O.:1680; I.V.:3068.8; IV Piggyback:152] Out: 2175 [Urine:2125; Blood:50] Intake/Output this shift: Total I/O In: 1882.5 [P.O.:720; I.V.:625; IV Piggyback:537.5] Out: 850 [Urine:850]  General appearance: alert, cooperative, appears stated age, no distress Resp: clear to auscultation bilaterally, no wheeze Cardio: regular rate and rhythm, tachycardic, S1, S2 normal, no murmur, click, rub or gallop GI: morbidly obese - soft, non-tender; bowel sounds normal; no masses,  no organomegaly Extremities: L foot dressed and dry - R LE with 1+ edema Neurologic: Grossly normal  Lab Results:  Basename 12/24/11 0541 12/23/11 0500  WBC 24.5* 27.5*  HGB 8.3* 8.4*  HCT 25.2* 25.2*  PLT 478* 495*   BMET  Basename 12/24/11 0541 12/23/11 0500  NA 131* 128*  K 3.8 3.8  CL 98 94*  CO2 23 24  GLUCOSE 234* 241*  BUN 21 23  CREATININE 1.27 1.37*  CALCIUM 8.2* 7.6*    Studies/Results: No results found.  Medications:  I have reviewed the patient's current medications.  Assessment/Plan:  SIRS (systemic inflammatory response syndrome)/Leukocytosis *lactic acid and PCT normal, also hemodynamically stable *still has tachycardia and resting tachypnea *will begin aggressive rehydration  *treat underlying causes- likely foot abscess *would culture reveals Klebsiella ocytoca - sensitivities pending *blood cultures unrevealing thus far  Foot abscess/diabetic foot wound *managed by Dr. Lynford Humphrey (primary team)  Fever *has  defervesced  Hyponatremia/ Volume depletion/Thrombocytosis *combination pseudohyponatremia and volume depletion *sodium increasing with rehydration *follow lytes *control CBG   Acute and chronic respiratory failure with hypoxia/OBSTRUCTIVE SLEEP APNEA *continue daytime nasal cannula oxygen and QHS CPAP *ECHO with poor acoustic windows so unable to evaluate for diastolic dysfunction. Right ventricle mildly dilated. Otherwise normal systolic function.   DIABETES, TYPE 2 * POORLY controlled * Was on Januvia 100mg  BID at home so will resume here. * Pharmacy with therapeutic substitution of Januvia with Tradjenta daily * increase Lantus * Continue SSI * Diabetes coordinator consult  Anemia due to chronic illness *hgb stable  Morbid obesity, BMI unknown *Obtain bariatric bed  DVT prophylaxis *pharmacy managing warfarin  Disposition *per primary team - we will continue to follow along with you - is medically stable for transfer to ortho bed   LOS: 4 days  12/25/2011, 3:49 PM  Lonia Blood, MD [facility] Office  [phone] Pager [phone]  On-Call/Text Page:      [email or web address]      password [facility]

## 2011-12-25 NOTE — Progress Notes (Signed)
Patient ID: Adrian Khan, male   DOB: Apr 26, 1976, 36 y.o.   MRN: [id number] AFEBRILE RR- 25 BLOOD [id number] PAIN UNDER CONTROL, PLAN DEBRIDEMENT IN AM. PATIENT AND MOTHER INFORMED.

## 2011-12-26 ENCOUNTER — Encounter (HOSPITAL_COMMUNITY): Payer: Self-pay | Admitting: *Deleted

## 2011-12-26 ENCOUNTER — Encounter (HOSPITAL_COMMUNITY)
Admission: AD | Disposition: A | Discharge status: Home Health | Payer: Self-pay | Source: Ambulatory Visit | Attending: Orthopedic Surgery

## 2011-12-26 ENCOUNTER — Inpatient Hospital Stay (HOSPITAL_COMMUNITY): Payer: PRIVATE HEALTH INSURANCE | Admitting: *Deleted

## 2011-12-26 HISTORY — PX: I&D EXTREMITY: SHX5045

## 2011-12-26 LAB — CBC
HCT: 23.8 % — ABNORMAL LOW (ref 39.0–52.0)
Platelets: 465 10*3/uL — ABNORMAL HIGH (ref 150–400)
RDW: 13.2 % (ref 11.5–15.5)
WBC: 21 10*3/uL — ABNORMAL HIGH (ref 4.0–10.5)

## 2011-12-26 LAB — BASIC METABOLIC PANEL
Calcium: 8.1 mg/dL — ABNORMAL LOW (ref 8.4–10.5)
Chloride: 102 mEq/L (ref 96–112)
Creatinine, Ser: 1.27 mg/dL (ref 0.50–1.35)
GFR calc Af Amer: 85 mL/min — ABNORMAL LOW (ref >90)
GFR calc non Af Amer: 73 mL/min — ABNORMAL LOW (ref >90)

## 2011-12-26 LAB — GLUCOSE, CAPILLARY
Glucose-Capillary: 208 mg/dL — ABNORMAL HIGH (ref 70–99)
Glucose-Capillary: 204 mg/dL — ABNORMAL HIGH (ref 70–99)
Glucose-Capillary: 185 mg/dL — ABNORMAL HIGH (ref 70–99)
Glucose-Capillary: 256 mg/dL — ABNORMAL HIGH (ref 70–99)

## 2011-12-26 LAB — PROTIME-INR
INR: 1.5 — ABNORMAL HIGH (ref 0.00–1.49)
Prothrombin Time: 18.4 seconds — ABNORMAL HIGH (ref 11.6–15.2)

## 2011-12-26 SURGERY — IRRIGATION AND DEBRIDEMENT EXTREMITY
Anesthesia: General | Site: Foot | Laterality: Left | Wound class: Dirty or Infected

## 2011-12-26 MED ORDER — PROPOFOL 10 MG/ML IV EMUL
INTRAVENOUS | Status: DC | PRN
  Administered 2011-12-26: 200 mg via INTRAVENOUS

## 2011-12-26 MED ORDER — WARFARIN SODIUM 7.5 MG PO TABS
15.0000 mg | ORAL_TABLET | Freq: Once | ORAL | Status: AC
  Administered 2011-12-26: 15 mg via ORAL
  Filled 2011-12-26: qty 2

## 2011-12-26 MED ORDER — SODIUM CHLORIDE 0.9 % IR SOLN
Status: DC | PRN
  Administered 2011-12-26: 10:00:00

## 2011-12-26 MED ORDER — SODIUM CHLORIDE 0.9 % IV SOLN
INTRAVENOUS | Status: DC | PRN
  Administered 2011-12-26: 08:00:00 via INTRAVENOUS

## 2011-12-26 MED ORDER — FENTANYL CITRATE 0.05 MG/ML IJ SOLN
INTRAMUSCULAR | Status: DC | PRN
  Administered 2011-12-26 (×2): 50 ug via INTRAVENOUS
  Administered 2011-12-26: 150 ug via INTRAVENOUS

## 2011-12-26 MED ORDER — ONDANSETRON HCL 4 MG/2ML IJ SOLN
INTRAMUSCULAR | Status: DC | PRN
  Administered 2011-12-26: 4 mg via INTRAVENOUS

## 2011-12-26 MED ORDER — SODIUM CHLORIDE 0.9 % IR SOLN
Status: DC | PRN
  Administered 2011-12-26: 1000 mL

## 2011-12-26 MED ORDER — SODIUM CHLORIDE 0.9 % IR SOLN
Status: DC | PRN
  Administered 2011-12-26 (×2): 3000 mL

## 2011-12-26 MED ORDER — HYDROMORPHONE HCL PF 1 MG/ML IJ SOLN: 0.2500 mg | INTRAMUSCULAR | Status: DC | PRN

## 2011-12-26 MED ORDER — ONDANSETRON HCL 4 MG/2ML IJ SOLN: 4.0000 mg | Freq: Once | INTRAMUSCULAR | Status: DC | PRN

## 2011-12-26 MED ORDER — SUCCINYLCHOLINE CHLORIDE 20 MG/ML IJ SOLN
INTRAMUSCULAR | Status: DC | PRN
  Administered 2011-12-26: 200 mg via INTRAVENOUS
  Administered 2011-12-28: 100 mg via INTRAVENOUS

## 2011-12-26 MED ORDER — INSULIN ASPART 100 UNIT/ML ~~LOC~~ SOLN
10.0000 [IU] | Freq: Three times a day (TID) | SUBCUTANEOUS | Status: DC
  Administered 2011-12-27 (×2): 10 [IU] via SUBCUTANEOUS

## 2011-12-26 MED ORDER — INSULIN GLARGINE 100 UNIT/ML ~~LOC~~ SOLN
22.0000 [IU] | Freq: Two times a day (BID) | SUBCUTANEOUS | Status: DC
  Administered 2011-12-26 – 2011-12-30 (×7): 22 [IU] via SUBCUTANEOUS

## 2011-12-26 SURGICAL SUPPLY — 39 items
BANDAGE ACE 4 STERILE (GAUZE/BANDAGES/DRESSINGS) ×2 IMPLANT
BANDAGE ELASTIC 3 VELCRO ST LF (GAUZE/BANDAGES/DRESSINGS) ×2 IMPLANT
BANDAGE ELASTIC 4 VELCRO ST LF (GAUZE/BANDAGES/DRESSINGS) ×2 IMPLANT
BANDAGE GAUZE ELAST BULKY 4 IN (GAUZE/BANDAGES/DRESSINGS) ×6 IMPLANT
BLADE SURG 10 STRL SS (BLADE) ×4 IMPLANT
BNDG COHESIVE 4X5 TAN STRL (GAUZE/BANDAGES/DRESSINGS) IMPLANT
CLOTH BEACON ORANGE TIMEOUT ST (SAFETY) ×2 IMPLANT
CUFF TOURNIQUET SINGLE 18IN (TOURNIQUET CUFF) ×2 IMPLANT
CUFF TOURNIQUET SINGLE 24IN (TOURNIQUET CUFF) IMPLANT
DRAPE U-SHAPE 47X51 STRL (DRAPES) ×2 IMPLANT
DRSG ADAPTIC 3X8 NADH LF (GAUZE/BANDAGES/DRESSINGS) IMPLANT
DRSG EMULSION OIL 3X3 NADH (GAUZE/BANDAGES/DRESSINGS) IMPLANT
DRSG PAD ABDOMINAL 8X10 ST (GAUZE/BANDAGES/DRESSINGS) ×8 IMPLANT
DURAPREP 26ML APPLICATOR (WOUND CARE) ×2 IMPLANT
ELECT REM PT RETURN 9FT ADLT (ELECTROSURGICAL)
ELECTRODE REM PT RTRN 9FT ADLT (ELECTROSURGICAL) IMPLANT
GAUZE SPONGE 4X4 12PLY STRL LF (GAUZE/BANDAGES/DRESSINGS) ×2 IMPLANT
GLOVE SS PI 9.0 STRL (GLOVE) ×2 IMPLANT
GOWN PREVENTION PLUS XLARGE (GOWN DISPOSABLE) ×2 IMPLANT
GOWN STRL NON-REIN LRG LVL3 (GOWN DISPOSABLE) ×4 IMPLANT
HANDPIECE INTERPULSE COAX TIP (DISPOSABLE)
KIT BASIN OR (CUSTOM PROCEDURE TRAY) ×2 IMPLANT
KIT ROOM TURNOVER OR (KITS) ×2 IMPLANT
MANIFOLD NEPTUNE II (INSTRUMENTS) ×2 IMPLANT
NS IRRIG 1000ML POUR BTL (IV SOLUTION) ×2 IMPLANT
PACK ORTHO EXTREMITY (CUSTOM PROCEDURE TRAY) ×2 IMPLANT
PAD ARMBOARD 7.5X6 YLW CONV (MISCELLANEOUS) ×4 IMPLANT
SET HNDPC FAN SPRY TIP SCT (DISPOSABLE) IMPLANT
SPONGE GAUZE 4X4 12PLY (GAUZE/BANDAGES/DRESSINGS) ×2 IMPLANT
SPONGE LAP 18X18 X RAY DECT (DISPOSABLE) ×2 IMPLANT
SPONGE LAP 4X18 X RAY DECT (DISPOSABLE) IMPLANT
STOCKINETTE IMPERVIOUS 9X36 MD (GAUZE/BANDAGES/DRESSINGS) ×2 IMPLANT
TOWEL OR 17X24 6PK STRL BLUE (TOWEL DISPOSABLE) ×2 IMPLANT
TOWEL OR 17X26 10 PK STRL BLUE (TOWEL DISPOSABLE) ×2 IMPLANT
TUBE ANAEROBIC SPECIMEN COL (MISCELLANEOUS) IMPLANT
TUBE CONNECTING 12X1/4 (SUCTIONS) ×2 IMPLANT
UNDERPAD 30X30 INCONTINENT (UNDERPADS AND DIAPERS) ×4 IMPLANT
WATER STERILE IRR 1000ML POUR (IV SOLUTION) ×2 IMPLANT
YANKAUER SUCT BULB TIP NO VENT (SUCTIONS) ×2 IMPLANT

## 2011-12-26 NOTE — Consult Note (Signed)
[facility] - TEAM 1 - STEPDOWN/ICU TEAM CONSULT F/U NOTE  Subjective: Pt is resting comfortably in bed.  He denies cp, sob, n/v, or abdom pain.    Objective: Blood pressure 136/81, pulse 97, temperature 98.1 F (36.7 C), temperature source Oral, resp. rate 40, height 5\' 11"  (1.803 m), weight 174.6 kg (384 lb 14.8 oz), SpO2 97.00%.  Intake/Output from previous day: 03/09 0701 - 03/10 0700 In: 3980 [P.O.:1680; I.V.:1700; IV Piggyback:600] Out: 1550 [Urine:1550] Intake/Output this shift: Total I/O In: 1617.5 [P.O.:480; I.V.:600; IV Piggyback:537.5] Out: 800 [Urine:800]  General appearance: alert, cooperative, appears stated age, no distress Resp: clear to auscultation bilaterally, no wheeze Cardio: regular rate and rhythm, no murmur, click, rub or gallop GI: morbidly obese - soft, non-tender; bowel sounds normal; no masses,  no organomegaly Extremities: L foot dressed and dry - R LE with 1+ edema Neurologic: Grossly normal  Lab Results:  Basename 12/26/11 0500 12/24/11 0541  WBC 21.0* 24.5*  HGB 7.6* 8.3*  HCT 23.8* 25.2*  PLT 465* 478*   BMET  Basename 12/26/11 0500 12/24/11 0541  NA 135 131*  K 4.6 3.8  CL 102 98  CO2 25 23  GLUCOSE 226* 234*  BUN 16 21  CREATININE 1.27 1.27  CALCIUM 8.1* 8.2*    Studies/Results: No results found.  Medications:  I have reviewed the patient's current medications.  Assessment/Plan:  SIRS (systemic inflammatory response syndrome)/Leukocytosis *lactic acid and PCT normal, also hemodynamically stable *tachycardia and tachypnea have essentially resolved/greatly improved *treat underlying causes- foot abscess *would culture reveals Klebsiella ocytoca - sensitive to Rocephin and Cipro *blood cultures unrevealing  Foot abscess/diabetic foot wound *managed by Dr. Lynford Humphrey (primary team) *could narrow abx to rocephin, or levaquin - will leave to primary team  Fever *has defervesced  Hyponatremia/ Volume  depletion - essentially resolved *combination pseudohyponatremia and volume depletion *sodium increasing with rehydration *follow lytes *control CBG   Acute and chronic respiratory failure with hypoxia/OBSTRUCTIVE SLEEP APNEA *continue daytime nasal cannula oxygen and QHS CPAP *ECHO with poor acoustic windows so unable to evaluate for diastolic dysfunction. Right ventricle mildly dilated. Otherwise normal systolic function.   DIABETES, TYPE 2 * remains POORLY controlled * Was on Januvia 100mg  BID at home so will resume here. * Pharmacy with therapeutic substitution of Januvia with Tradjenta daily * increase Lantus again * Continue SSI  Anemia due to chronic illness *hgb trending down w/ surgeries - transfuse only if drops below Hgb 7, or if symptomatic otherwise  Morbid obesity, BMI unknown *bariatric bed  DVT prophylaxis *pharmacy managing warfarin  Disposition *per primary team - we will continue to follow along with you - is medically stable for transfer to ortho bed   LOS: 5 days  12/26/2011, 4:50 PM  Lonia Blood, MD [facility] Office  [phone] Pager [phone]  On-Call/Text Page:      [email or web address]      password [facility]

## 2011-12-26 NOTE — Anesthesia Preprocedure Evaluation (Addendum)
Anesthesia Evaluation  Patient identified by MRN, date of birth, ID band Patient awake    Reviewed: Allergy & Precautions, H&P , NPO status , Patient's Chart, lab work & pertinent test results  Airway Mallampati: III TM Distance: >3 FB Neck ROM: Full    Dental  (+) Teeth Intact and Dental Advisory Given   Pulmonary sleep apnea and Continuous Positive Airway Pressure Ventilation ,    + decreased breath sounds      Cardiovascular hypertension, negative cardio ROS  Rhythm:Regular Rate:Normal     Neuro/Psych  Headaches,    GI/Hepatic Neg liver ROS, GERD-  ,  Endo/Other  Diabetes mellitus-, Poorly Controlled, Oral Hypoglycemic Agents  Renal/GU negative Renal ROS     Musculoskeletal   Abdominal (+) + obese,   Peds  Hematology negative hematology ROS (+)   Anesthesia Other Findings   Reproductive/Obstetrics                          Anesthesia Physical Anesthesia Plan  ASA: III  Anesthesia Plan: General   Post-op Pain Management:    Induction: Intravenous  Airway Management Planned: Oral ETT  Additional Equipment:   Intra-op Plan:   Post-operative Plan: Extubation in OR  Informed Consent: I have reviewed the patients History and Physical, chart, labs and discussed the procedure including the risks, benefits and alternatives for the proposed anesthesia with the patient or authorized representative who has indicated his/her understanding and acceptance.   Dental advisory given  Plan Discussed with: Anesthesiologist, Surgeon and CRNA  Anesthesia Plan Comments: (Plan GA with oral ETT  Kipp Brood, MD)       Anesthesia Quick Evaluation

## 2011-12-26 NOTE — Preoperative (Signed)
Beta Blockers   Reason not to administer Beta Blockers:Not Applicable 

## 2011-12-26 NOTE — Progress Notes (Signed)
ANTIBIOTIC/ANTICOAGULATION CONSULT NOTE - FOLLOW UP  Pharmacy Consult for vancomycin/coumadin Indication: foot abscess/leukocytosis and vte prophylaxis    Assessment: 36 year old male on Day 5 vanc/zosyn for foot abscess. Blood cultures are NGTD x2. Abscess culture growing Klebsiella oxytoca (S to zosyn) & Gram + cocci. Afebrile, WBC down to 21-->24.5  Vancomycin level yesterday was 12.4-->25.9 (after holding for ~24hours).  Estimated Ke=0.03, t1/2~24hours. Dose was changed to 1500mg  q24h yesterday.  Patient also on coumadin for vte prophylaxis and INR today remains subtherapeutic at 1.50 after 3 doses of 10mg . No bleeding issues reported and CBC appears stable.   Goal of Therapy:  Vancomycin trough level 15-20 mcg/ml INR 2-3  Plan:  1. Coumadin 15mg  po x 1 today, check INR in AM 2. Continue vancomycin 1500mg  q24 hours. Follow patient closely for improved renal function and need for change in dose. Will obtain troughs when necessary.   3. Continue zosyn 3.375gm IV q8h (extended infusion)  Bayard Beaver. Saul Fordyce, PharmD  12/26/2011 1:47 PM    Allergies  Allergen Reactions  . Sulfonamide Derivatives     REACTION: cramps    Patient Measurements: Height: 5\' 11"  (180.3 cm) Weight: 384 lb 14.8 oz (174.6 kg) IBW/kg (Calculated) : 75.3   Vital Signs: Temp: 97.8 F (36.6 C) (03/10 1200) Temp src: Oral (03/10 1200) BP: 156/76 mmHg (03/10 1230) Pulse Rate: 97  (03/10 1230) Intake/Output from previous day: 03/09 0701 - 03/10 0700 In: 3980 [P.O.:1680; I.V.:1700; IV Piggyback:600] Out: 1550 [Urine:1550] Intake/Output from this shift: Total I/O In: 762.5 [I.V.:500; IV Piggyback:262.5] Out: -   Labs:  Basename 12/26/11 0500 12/24/11 0541  WBC 21.0* 24.5*  HGB 7.6* 8.3*  PLT 465* 478*  LABCREA -- --  CREATININE 1.27 1.27   Estimated Creatinine Clearance: 134.6 ml/min (by C-G formula based on Cr of 1.27).  Basename 12/25/11 0532 12/24/11 0541  VANCOTROUGH 12.7 25.9*  VANCOPEAK  -- --  Drue Dun -- --  GENTTROUGH -- --  GENTPEAK -- --  GENTRANDOM -- --  TOBRATROUGH -- --  TOBRAPEAK -- --  TOBRARND -- --  AMIKACINPEAK -- --  AMIKACINTROU -- --  AMIKACIN -- --     Microbiology: Recent Results (from the past 720 hour(s))  CULTURE, BLOOD (ROUTINE X 2)     Status: Normal (Preliminary result)   Collection Time   12/21/11 11:46 PM      Component Value Range Status Comment   Specimen Description BLOOD RIGHT ARM   Final    Special Requests BOTTLES DRAWN AEROBIC AND ANAEROBIC 5CC   Final    Culture  Setup Time [id number]   Final    Culture     Final    Value:        BLOOD CULTURE RECEIVED NO GROWTH TO DATE CULTURE WILL BE HELD FOR 5 DAYS BEFORE ISSUING A FINAL NEGATIVE REPORT   Report Status PENDING   Incomplete   CULTURE, BLOOD (ROUTINE X 2)     Status: Normal (Preliminary result)   Collection Time   12/21/11 11:56 PM      Component Value Range Status Comment   Specimen Description BLOOD RIGHT HAND   Final    Special Requests BOTTLES DRAWN AEROBIC AND ANAEROBIC 5CC   Final    Culture  Setup Time [id number]   Final    Culture     Final    Value:        BLOOD CULTURE RECEIVED NO GROWTH TO DATE CULTURE WILL BE HELD FOR 5 DAYS BEFORE  ISSUING A FINAL NEGATIVE REPORT   Report Status PENDING   Incomplete   SURGICAL PCR SCREEN     Status: Normal   Collection Time   12/22/11  7:41 AM      Component Value Range Status Comment   MRSA, PCR NEGATIVE  NEGATIVE  Final    Staphylococcus aureus NEGATIVE  NEGATIVE  Final   ANAEROBIC CULTURE     Status: Normal (Preliminary result)   Collection Time   12/22/11  9:22 AM      Component Value Range Status Comment   Specimen Description ABSCESS FOOT LEFT   Final    Special Requests PT ON AUGMENTIN   Final    Gram Stain     Final    Value: FEW WBC PRESENT,BOTH PMN AND MONONUCLEAR     NO SQUAMOUS EPITHELIAL CELLS SEEN     ABUNDANT GRAM POSITIVE COCCI     IN PAIRS ABUNDANT GRAM VARIABLE ROD   Culture     Final    Value: NO  ANAEROBES ISOLATED; CULTURE IN PROGRESS FOR 5 DAYS   Report Status PENDING   Incomplete   CULTURE, ROUTINE-ABSCESS     Status: Normal   Collection Time   12/22/11  9:22 AM      Component Value Range Status Comment   Specimen Description ABSCESS FOOT LEFT   Final    Special Requests PT ON AUGMENTIN   Final    Gram Stain     Final    Value: FEW WBC PRESENT,BOTH PMN AND MONONUCLEAR     NO SQUAMOUS EPITHELIAL CELLS SEEN     ABUNDANT GRAM POSITIVE COCCI     IN PAIRS ABUNDANT GRAM VARIABLE ROD   Culture ABUNDANT KLEBSIELLA OXYTOCA   Final    Report Status 12/25/2011 FINAL   Final    Organism ID, Bacteria KLEBSIELLA OXYTOCA   Final   URINE CULTURE     Status: Normal   Collection Time   12/23/11  5:05 AM      Component Value Range Status Comment   Specimen Description URINE, CLEAN CATCH   Final    Special Requests NONE   Final    Culture  Setup Time [id number]   Final    Colony Count NO GROWTH   Final    Culture NO GROWTH   Final    Report Status 12/24/2011 FINAL   Final     Anti-infectives     Start     Dose/Rate Route Frequency Ordered Stop   12/26/11 0930   polymyxin B 500,000 Units, bacitracin 50,000 Units in sodium chloride irrigation 0.9 % 500 mL irrigation  Status:  Discontinued          As needed 12/26/11 0930 12/26/11 1006   12/25/11 0900   vancomycin (VANCOCIN) 1,500 mg in sodium chloride 0.9 % 500 mL IVPB        1,500 mg 250 mL/hr over 120 Minutes Intravenous Every 24 hours 12/25/11 0757     12/24/11 0800   piperacillin-tazobactam (ZOSYN) IVPB 3.375 g  Status:  Discontinued        3.375 g 12.5 mL/hr over 240 Minutes Intravenous To Surgery 12/24/11 0728 12/24/11 0939   12/22/11 1830   vancomycin (VANCOCIN) 1,500 mg in sodium chloride 0.9 % 500 mL IVPB        1,500 mg 250 mL/hr over 120 Minutes Intravenous Every 12 hours 12/22/11 1822 12/23/11 1930   12/22/11 1700   piperacillin-tazobactam (ZOSYN) IVPB 3.375 g  3.375 g 12.5 mL/hr over 240 Minutes Intravenous Every  8 hours 12/22/11 1601     12/22/11 1330   vancomycin (VANCOCIN) IVPB 1000 mg/200 mL premix        1,000 mg 200 mL/hr over 60 Minutes Intravenous Every 12 hours 12/22/11 1228 12/22/11 1510

## 2011-12-26 NOTE — Anesthesia Postprocedure Evaluation (Signed)
  Anesthesia Post-op Note  Patient: Adrian Khan  Procedure(s) Performed: Procedure(s) (LRB): IRRIGATION AND DEBRIDEMENT EXTREMITY (Left)  Patient Location: PACU  Anesthesia Type: General  Level of Consciousness: awake, alert  and oriented  Airway and Oxygen Therapy: Patient Spontanous Breathing and Patient connected to nasal cannula oxygen  Post-op Pain: mild  Post-op Assessment: Post-op Vital signs reviewed and Patient's Cardiovascular Status Stable  Post-op Vital Signs: stable  Complications: No apparent anesthesia complications

## 2011-12-26 NOTE — Brief Op Note (Signed)
12/21/2011 - 12/26/2011  10:02 AM  PATIENT:  Adrian Khan  36 y.o. male  PRE-OPERATIVE DIAGNOSIS:  Diabetic ulcer of left foot  POST-OPERATIVE DIAGNOSIS:  Diabetic foot ulcer; left  PROCEDURE:  Procedure(s) (LRB): IRRIGATION AND DEBRIDEMENT EXTREMITY (Left)  SURGEON:  Surgeon(s) and Role:    * Kennieth Rad, MD - Primary  PHYSICIAN ASSISTANT:   ASSISTANTS: none   ANESTHESIA:   general  EBL:  Total I/O In: 400 [I.V.:400] Out: -   BLOOD ADMINISTERED:none  DRAINS: none   LOCAL MEDICATIONS USED:  NONE  SPECIMEN:  No Specimen  DISPOSITION OF SPECIMEN:  N/A  COUNTS:  YES  TOURNIQUET:  * No tourniquets in log *  DICTATION: .Other Dictation: Dictation Number report [id number]  PLAN OF CARE: Admit to inpatient   PATIENT DISPOSITION:  PACU - hemodynamically stable.   Delay start of Pharmacological VTE agent (>24hrs) due to surgical blood loss or risk of bleeding: yes

## 2011-12-27 ENCOUNTER — Encounter (HOSPITAL_COMMUNITY): Payer: Self-pay | Admitting: Orthopedic Surgery

## 2011-12-27 LAB — PROTIME-INR
INR: 1.89 — ABNORMAL HIGH (ref 0.00–1.49)
Prothrombin Time: 22 seconds — ABNORMAL HIGH (ref 11.6–15.2)

## 2011-12-27 LAB — GLUCOSE, CAPILLARY
Glucose-Capillary: 173 mg/dL — ABNORMAL HIGH (ref 70–99)
Glucose-Capillary: 142 mg/dL — ABNORMAL HIGH (ref 70–99)

## 2011-12-27 LAB — CBC
Hemoglobin: 7.7 g/dL — ABNORMAL LOW (ref 13.0–17.0)
Platelets: 471 10*3/uL — ABNORMAL HIGH (ref 150–400)
RBC: 2.83 MIL/uL — ABNORMAL LOW (ref 4.22–5.81)
WBC: 23.1 10*3/uL — ABNORMAL HIGH (ref 4.0–10.5)

## 2011-12-27 LAB — ANAEROBIC CULTURE

## 2011-12-27 MED ORDER — INSULIN ASPART 100 UNIT/ML ~~LOC~~ SOLN
0.0000 [IU] | Freq: Every day | SUBCUTANEOUS | Status: DC
  Filled 2011-12-27: qty 3

## 2011-12-27 MED ORDER — HYDROMORPHONE 0.3 MG/ML IV SOLN
INTRAVENOUS | Status: AC
  Administered 2011-12-27: 0.9 mg via INTRAVENOUS
  Filled 2011-12-27: qty 25

## 2011-12-27 MED ORDER — INSULIN ASPART 100 UNIT/ML ~~LOC~~ SOLN
12.0000 [IU] | Freq: Three times a day (TID) | SUBCUTANEOUS | Status: DC
  Administered 2011-12-27 – 2011-12-30 (×7): 12 [IU] via SUBCUTANEOUS

## 2011-12-27 MED ORDER — INSULIN ASPART 100 UNIT/ML ~~LOC~~ SOLN: 6.0000 [IU] | Freq: Three times a day (TID) | SUBCUTANEOUS | Status: DC

## 2011-12-27 MED ORDER — WARFARIN SODIUM 7.5 MG PO TABS
15.0000 mg | ORAL_TABLET | Freq: Once | ORAL | Status: AC
  Administered 2011-12-27: 15 mg via ORAL
  Filled 2011-12-27: qty 2

## 2011-12-27 MED ORDER — INSULIN ASPART 100 UNIT/ML ~~LOC~~ SOLN
0.0000 [IU] | Freq: Three times a day (TID) | SUBCUTANEOUS | Status: DC
  Administered 2011-12-27 – 2011-12-29 (×5): 3 [IU] via SUBCUTANEOUS
  Filled 2011-12-27: qty 3

## 2011-12-27 NOTE — Op Note (Signed)
NAME:  Adrian Khan, Adrian Khan NO.:  MEDICAL RECORD NO.:  [id number]  LOCATION:                                 FACILITY:  PHYSICIAN:  Myrtie Neither, MD      DATE OF BIRTH:  06/15/1976  DATE OF PROCEDURE:  12/26/2011 DATE OF DISCHARGE:                              OPERATIVE REPORT   PREOPERATIVE DIAGNOSIS:  Open wound, infected left diabetic foot.  POSTOPERATIVE DIAGNOSIS:  Open wound, infected left diabetic foot.  ANESTHESIA:  General.  PROCEDURE:  Irrigation, debridement, and packing with antibiotic soaked saline dressing.  PROCEDURE IN DETAIL:  The patient was taken to the operating room. After given adequate preop medications, given general anesthesia.  The left foot was prepped with Betadine scrub and paint with Betadine solution and draped in sterile manner.  There was  less, but still some, purulent material about the plantar aspect of the both wounds.  Necrotic tissue was debrided with a sharp scalpel.  Curettement was also done into the depths of the plantar as well as the dorsal wounds.  Soft tissue debridement was also done of the 2 dorsal open areas.  Copious irrigation was done with the irrigator.  After irrigation and debridement, there was good bleeding from the area and the tissues looked much better.  Packing of Kerlix soaked in the antibiotic solution was placed into the wound with Xeroform to the skin.  Compressive dressings applied.  The patient tolerated the procedure quite well, went to recovery room in stable and satisfactory condition.     Myrtie Neither, MD     AC/MEDQ  D:  12/26/2011  T:  12/27/2011  Job:  [id number]

## 2011-12-27 NOTE — Consult Note (Signed)
[facility] - TEAM 1 - STEPDOWN/ICU TEAM CONSULT F/U NOTE  Subjective: Very sleepy this am. Endorses not using CPAP - prefers home mask.  Denies cp, sob, n/v, or abdom pain.  Objective: Blood pressure 152/96, pulse 89, temperature 98.9 F (37.2 C), temperature source Oral, resp. rate 34, height 5\' 11"  (1.803 m), weight 174.6 kg (384 lb 14.8 oz), SpO2 99.00%.  Intake/Output from previous day: 03/10 0701 - 03/11 0700 In: 2000 [P.O.:600; I.V.:800; IV Piggyback:600] Out: 1100 [Urine:1100] Intake/Output this shift: Total I/O In: 600 [P.O.:600] Out: 400 [Urine:400]  General appearance: alert, cooperative, appears stated age, no distress Resp: clear to auscultation bilaterally, no wheeze Cardio: regular rate and rhythm, no murmur, click, rub or gallop GI: morbidly obese - soft, non-tender; bowel sounds normal; no masses,  no organomegaly Extremities: L foot dressed and dry - R LE with 1+ edema Neurologic: Grossly normal  Lab Results:  Basename 12/27/11 0520 12/26/11 0500  WBC 23.1* 21.0*  HGB 7.7* 7.6*  HCT 24.2* 23.8*  PLT 471* 465*   BMET  Basename 12/26/11 0500  NA 135  K 4.6  CL 102  CO2 25  GLUCOSE 226*  BUN 16  CREATININE 1.27  CALCIUM 8.1*    Studies/Results: No results found.  Medications:  I have reviewed the patient's current medications.  Assessment/Plan:  SIRS (systemic inflammatory response syndrome)/Leukocytosis *resolved *lactic acid and PCT normal, also hemodynamically stable *tachycardia and tachypnea have essentially resolved/greatly improved *treat underlying causes- foot abscess *would culture reveals Klebsiella ocytoca - sensitive to Rocephin and Cipro *blood cultures unrevealing  Foot abscess/diabetic foot wound *managed by Dr. Lynford Humphrey (primary team) *could narrow abx to rocephin, or levaquin - will leave to primary team  Fever *has defervesced  Hyponatremia/ Volume depletion - essentially resolved *combination  pseudohyponatremia and volume depletion *sodium increasing with rehydration *follow lytes *control CBG  Acute and chronic respiratory failure with hypoxia/OBSTRUCTIVE SLEEP APNEA *continue daytime nasal cannula oxygen and QHS CPAP- will ask mom to bring in mask from home *ECHO with poor acoustic windows so unable to evaluate for diastolic dysfunction. Right ventricle mildly dilated. Otherwise normal systolic function.   DIABETES, TYPE 2 * last 2 readings show better control * Was on Januvia 100mg  BID at home  * Pharmacy with therapeutic substitution of Januvia with Tradjenta daily * increase Lantus again * Continue SSI * add 6 units meal coverage  Anemia due to chronic illness *hgb trending down w/ surgeries - transfuse only if drops below Hgb 7, or if symptomatic otherwise  Morbid obesity, BMI unknown *bariatric bed  DVT prophylaxis *pharmacy managing warfarin  Disposition *per primary team - we will continue to follow along with you - is medically stable for transfer to ortho bed   LOS: 6 days  12/27/2011, 2:03 PM  Junious Silk, ANP [facility] Office  [phone] Pager [phone]  On-Call/Text Page:      [email or web address]      password [email or web address]  I have personally examined this patient and reviewed the entire database. I have reviewed the above note, made any necessary editorial changes, and agree with its content.  Lonia Blood, MD [facility]

## 2011-12-27 NOTE — Progress Notes (Signed)
Placed pt. On CPAP via full face mask at 6.0 cm H20 for patient comfort with 2 lpm O2 bleed in. Pt. Tolerating well at this time.

## 2011-12-27 NOTE — Progress Notes (Signed)
ANTIBIOTIC/ANTICOAGULATION CONSULT NOTE - FOLLOW UP  Pharmacy Consult for vancomycin/coumadin Indication: foot abscess/leukocytosis and vte prophylaxis    Assessment: 36 year old male on Day 6 vanc/zosyn for foot abscess. Blood cultures are NGTD x2. Abscess culture growing Klebsiella oxytoca (S to zosyn, cefazolin, Septra). Afebrile, WBC 23.1 (stable)  Vancomycin level 3/9 was 12.4-->25.9 (after holding for ~24hours).  Estimated Ke=0.03, t1/2~24hours. Dose was changed to 1500mg  q24h yesterday.  Patient also on coumadin for vte prophylaxis and INR today remains subtherapeutic at 1.89 after 4 days of warfarin. No bleeding issues reported and CBC appears stable.   Goal of Therapy:  Vancomycin trough level 15-20 mcg/ml INR 2-3  Plan:  1. Coumadin 15mg  po x 1 today, check INR in AM 2. Continue vancomycin 1500mg  q24 hours. Follow patient closely for improved renal function and need for change in dose. Will obtain troughs when necessary.   3. Continue zosyn 3.375gm IV q8h (extended infusion)  Celedonio Miyamoto, PharmD, [facility] Clinical Pharmacist Pager [phone]   12/27/2011 8:41 AM    Allergies  Allergen Reactions  . Sulfonamide Derivatives     REACTION: cramps    Patient Measurements: Height: 5\' 11"  (180.3 cm) Weight: 384 lb 14.8 oz (174.6 kg) IBW/kg (Calculated) : 75.3   Vital Signs: Temp: 98.5 F (36.9 C) (03/11 0727) Temp src: Oral (03/11 0727) BP: 152/92 mmHg (03/11 0727) Pulse Rate: 91  (03/11 0727) Intake/Output from previous day: 03/10 0701 - 03/11 0700 In: 2000 [P.O.:600; I.V.:800; IV Piggyback:600] Out: 1100 [Urine:1100] Intake/Output from this shift:    Labs:  Basename 12/27/11 0520 12/26/11 0500  WBC 23.1* 21.0*  HGB 7.7* 7.6*  PLT 471* 465*  LABCREA -- --  CREATININE -- 1.27   Estimated Creatinine Clearance: 134.6 ml/min (by C-G formula based on Cr of 1.27).  Basename 12/25/11 0532  VANCOTROUGH 12.7  VANCOPEAK --  VANCORANDOM --  GENTTROUGH --    GENTPEAK --  GENTRANDOM --  TOBRATROUGH --  TOBRAPEAK --  TOBRARND --  AMIKACINPEAK --  AMIKACINTROU --  AMIKACIN --     Microbiology: Recent Results (from the past 720 hour(s))  CULTURE, BLOOD (ROUTINE X 2)     Status: Normal (Preliminary result)   Collection Time   12/21/11 11:46 PM      Component Value Range Status Comment   Specimen Description BLOOD RIGHT ARM   Final    Special Requests BOTTLES DRAWN AEROBIC AND ANAEROBIC 5CC   Final    Culture  Setup Time [id number]   Final    Culture     Final    Value:        BLOOD CULTURE RECEIVED NO GROWTH TO DATE CULTURE WILL BE HELD FOR 5 DAYS BEFORE ISSUING A FINAL NEGATIVE REPORT   Report Status PENDING   Incomplete   CULTURE, BLOOD (ROUTINE X 2)     Status: Normal (Preliminary result)   Collection Time   12/21/11 11:56 PM      Component Value Range Status Comment   Specimen Description BLOOD RIGHT HAND   Final    Special Requests BOTTLES DRAWN AEROBIC AND ANAEROBIC 5CC   Final    Culture  Setup Time [id number]   Final    Culture     Final    Value:        BLOOD CULTURE RECEIVED NO GROWTH TO DATE CULTURE WILL BE HELD FOR 5 DAYS BEFORE ISSUING A FINAL NEGATIVE REPORT   Report Status PENDING   Incomplete   SURGICAL PCR SCREEN  Status: Normal   Collection Time   12/22/11  7:41 AM      Component Value Range Status Comment   MRSA, PCR NEGATIVE  NEGATIVE  Final    Staphylococcus aureus NEGATIVE  NEGATIVE  Final   ANAEROBIC CULTURE     Status: Normal (Preliminary result)   Collection Time   12/22/11  9:22 AM      Component Value Range Status Comment   Specimen Description ABSCESS FOOT LEFT   Final    Special Requests PT ON AUGMENTIN   Final    Gram Stain     Final    Value: FEW WBC PRESENT,BOTH PMN AND MONONUCLEAR     NO SQUAMOUS EPITHELIAL CELLS SEEN     ABUNDANT GRAM POSITIVE COCCI     IN PAIRS ABUNDANT GRAM VARIABLE ROD   Culture     Final    Value: NO ANAEROBES ISOLATED; CULTURE IN PROGRESS FOR 5 DAYS   Report Status  PENDING   Incomplete   CULTURE, ROUTINE-ABSCESS     Status: Normal   Collection Time   12/22/11  9:22 AM      Component Value Range Status Comment   Specimen Description ABSCESS FOOT LEFT   Final    Special Requests PT ON AUGMENTIN   Final    Gram Stain     Final    Value: FEW WBC PRESENT,BOTH PMN AND MONONUCLEAR     NO SQUAMOUS EPITHELIAL CELLS SEEN     ABUNDANT GRAM POSITIVE COCCI     IN PAIRS ABUNDANT GRAM VARIABLE ROD   Culture ABUNDANT KLEBSIELLA OXYTOCA   Final    Report Status 12/25/2011 FINAL   Final    Organism ID, Bacteria KLEBSIELLA OXYTOCA   Final   URINE CULTURE     Status: Normal   Collection Time   12/23/11  5:05 AM      Component Value Range Status Comment   Specimen Description URINE, CLEAN CATCH   Final    Special Requests NONE   Final    Culture  Setup Time [id number]   Final    Colony Count NO GROWTH   Final    Culture NO GROWTH   Final    Report Status 12/24/2011 FINAL   Final     Anti-infectives     Start     Dose/Rate Route Frequency Ordered Stop   12/26/11 0930   polymyxin B 500,000 Units, bacitracin 50,000 Units in sodium chloride irrigation 0.9 % 500 mL irrigation  Status:  Discontinued          As needed 12/26/11 0930 12/26/11 1006   12/25/11 0900   vancomycin (VANCOCIN) 1,500 mg in sodium chloride 0.9 % 500 mL IVPB        1,500 mg 250 mL/hr over 120 Minutes Intravenous Every 24 hours 12/25/11 0757     12/24/11 0800   piperacillin-tazobactam (ZOSYN) IVPB 3.375 g  Status:  Discontinued        3.375 g 12.5 mL/hr over 240 Minutes Intravenous To Surgery 12/24/11 0728 12/24/11 0939   12/22/11 1830   vancomycin (VANCOCIN) 1,500 mg in sodium chloride 0.9 % 500 mL IVPB        1,500 mg 250 mL/hr over 120 Minutes Intravenous Every 12 hours 12/22/11 1822 12/23/11 1930   12/22/11 1700   piperacillin-tazobactam (ZOSYN) IVPB 3.375 g        3.375 g 12.5 mL/hr over 240 Minutes Intravenous Every 8 hours 12/22/11 1601     12/22/11  1330   vancomycin (VANCOCIN)  IVPB 1000 mg/200 mL premix        1,000 mg 200 mL/hr over 60 Minutes Intravenous Every 12 hours 12/22/11 1228 12/22/11 1510

## 2011-12-28 ENCOUNTER — Encounter (HOSPITAL_COMMUNITY): Payer: Self-pay | Admitting: Anesthesiology

## 2011-12-28 ENCOUNTER — Inpatient Hospital Stay (HOSPITAL_COMMUNITY): Payer: PRIVATE HEALTH INSURANCE | Admitting: Anesthesiology

## 2011-12-28 ENCOUNTER — Encounter (HOSPITAL_COMMUNITY)
Admission: AD | Disposition: A | Discharge status: Home Health | Payer: Self-pay | Source: Ambulatory Visit | Attending: Orthopedic Surgery

## 2011-12-28 HISTORY — PX: I&D EXTREMITY: SHX5045

## 2011-12-28 LAB — CBC
HCT: 24.6 % — ABNORMAL LOW (ref 39.0–52.0)
Hemoglobin: 7.9 g/dL — ABNORMAL LOW (ref 13.0–17.0)
MCHC: 32.1 g/dL (ref 30.0–36.0)
MCV: 85.4 fL (ref 78.0–100.0)
RDW: 13.2 % (ref 11.5–15.5)

## 2011-12-28 LAB — CULTURE, BLOOD (ROUTINE X 2): Culture  Setup Time: 201303060434

## 2011-12-28 LAB — GLUCOSE, CAPILLARY
Glucose-Capillary: 126 mg/dL — ABNORMAL HIGH (ref 70–99)
Glucose-Capillary: 145 mg/dL — ABNORMAL HIGH (ref 70–99)

## 2011-12-28 SURGERY — IRRIGATION AND DEBRIDEMENT EXTREMITY
Anesthesia: General | Site: Foot | Laterality: Left | Wound class: Dirty or Infected

## 2011-12-28 MED ORDER — MIDAZOLAM HCL 2 MG/2ML IJ SOLN: 0.5000 mg | Freq: Once | INTRAMUSCULAR | Status: DC | PRN

## 2011-12-28 MED ORDER — FENTANYL CITRATE 0.05 MG/ML IJ SOLN: 25.0000 ug | INTRAMUSCULAR | Status: DC | PRN

## 2011-12-28 MED ORDER — POLYETHYLENE GLYCOL 3350 17 G PO PACK
17.0000 g | PACK | Freq: Every day | ORAL | Status: DC
  Administered 2011-12-28 – 2012-01-04 (×7): 17 g via ORAL
  Filled 2011-12-28 (×9): qty 1

## 2011-12-28 MED ORDER — DEXTROSE 5 % IV SOLN
2.0000 g | INTRAVENOUS | Status: DC
  Administered 2011-12-28 – 2012-01-05 (×9): 2 g via INTRAVENOUS
  Filled 2011-12-28 (×9): qty 2

## 2011-12-28 MED ORDER — KETOROLAC TROMETHAMINE 30 MG/ML IJ SOLN: 15.0000 mg | Freq: Once | INTRAMUSCULAR | Status: DC | PRN

## 2011-12-28 MED ORDER — ACETAMINOPHEN 325 MG PO TABS: 325.0000 mg | ORAL_TABLET | ORAL | Status: DC | PRN

## 2011-12-28 MED ORDER — SODIUM CHLORIDE 0.9 % IR SOLN
Status: DC | PRN
  Administered 2011-12-28: 1000 mL

## 2011-12-28 MED ORDER — MEPERIDINE HCL 25 MG/ML IJ SOLN: 6.2500 mg | INTRAMUSCULAR | Status: DC | PRN

## 2011-12-28 MED ORDER — SODIUM CHLORIDE 0.9 % IR SOLN
Status: DC | PRN
  Administered 2011-12-28: 3000 mL

## 2011-12-28 MED ORDER — PROMETHAZINE HCL 25 MG/ML IJ SOLN: 6.2500 mg | INTRAMUSCULAR | Status: DC | PRN

## 2011-12-28 MED ORDER — MIDAZOLAM HCL 5 MG/5ML IJ SOLN
INTRAMUSCULAR | Status: DC | PRN
  Administered 2011-12-28: 2 mg via INTRAVENOUS

## 2011-12-28 MED ORDER — MAGNESIUM HYDROXIDE 400 MG/5ML PO SUSP
30.0000 mL | Freq: Once | ORAL | Status: AC
  Administered 2011-12-28: 30 mL via ORAL
  Filled 2011-12-28: qty 30

## 2011-12-28 MED ORDER — DOCUSATE SODIUM 100 MG PO CAPS
100.0000 mg | ORAL_CAPSULE | Freq: Two times a day (BID) | ORAL | Status: DC
  Administered 2011-12-28 – 2012-01-05 (×14): 100 mg via ORAL
  Filled 2011-12-28 (×19): qty 1

## 2011-12-28 MED ORDER — WARFARIN SODIUM 10 MG PO TABS
10.0000 mg | ORAL_TABLET | Freq: Once | ORAL | Status: AC
  Administered 2011-12-28: 10 mg via ORAL
  Filled 2011-12-28: qty 1

## 2011-12-28 MED ORDER — FENTANYL CITRATE 0.05 MG/ML IJ SOLN
INTRAMUSCULAR | Status: DC | PRN
  Administered 2011-12-28 (×2): 50 ug via INTRAVENOUS

## 2011-12-28 MED ORDER — PROPOFOL 10 MG/ML IV EMUL
INTRAVENOUS | Status: DC | PRN
  Administered 2011-12-28: 300 mg via INTRAVENOUS

## 2011-12-28 SURGICAL SUPPLY — 45 items
BANDAGE ELASTIC 3 VELCRO ST LF (GAUZE/BANDAGES/DRESSINGS) ×2 IMPLANT
BANDAGE ELASTIC 4 VELCRO ST LF (GAUZE/BANDAGES/DRESSINGS) ×2 IMPLANT
BANDAGE GAUZE ELAST BULKY 4 IN (GAUZE/BANDAGES/DRESSINGS) ×2 IMPLANT
BLADE SURG 10 STRL SS (BLADE) ×4 IMPLANT
BNDG COHESIVE 4X5 TAN STRL (GAUZE/BANDAGES/DRESSINGS) ×2 IMPLANT
CLOTH BEACON ORANGE TIMEOUT ST (SAFETY) ×2 IMPLANT
CUFF TOURNIQUET SINGLE 18IN (TOURNIQUET CUFF) ×2 IMPLANT
CUFF TOURNIQUET SINGLE 24IN (TOURNIQUET CUFF) IMPLANT
DRAPE U-SHAPE 47X51 STRL (DRAPES) ×2 IMPLANT
DRSG ADAPTIC 3X8 NADH LF (GAUZE/BANDAGES/DRESSINGS) ×2 IMPLANT
DRSG EMULSION OIL 3X3 NADH (GAUZE/BANDAGES/DRESSINGS) ×2 IMPLANT
DRSG PAD ABDOMINAL 8X10 ST (GAUZE/BANDAGES/DRESSINGS) ×2 IMPLANT
DRSG VAC ATS LRG SENSATRAC (GAUZE/BANDAGES/DRESSINGS) ×2 IMPLANT
DRSG VAC ATS MED SENSATRAC (GAUZE/BANDAGES/DRESSINGS) ×2 IMPLANT
DURAPREP 26ML APPLICATOR (WOUND CARE) IMPLANT
ELECT REM PT RETURN 9FT ADLT (ELECTROSURGICAL)
ELECTRODE REM PT RTRN 9FT ADLT (ELECTROSURGICAL) IMPLANT
GLOVE BIOGEL PI IND STRL 7.5 (GLOVE) ×2 IMPLANT
GLOVE BIOGEL PI INDICATOR 7.5 (GLOVE) ×2
GLOVE SS N UNI LF 7.5 STRL (GLOVE) ×4 IMPLANT
GLOVE SS PI 9.0 STRL (GLOVE) ×2 IMPLANT
GOWN PREVENTION PLUS XLARGE (GOWN DISPOSABLE) ×6 IMPLANT
GOWN STRL NON-REIN LRG LVL3 (GOWN DISPOSABLE) IMPLANT
HANDPIECE INTERPULSE COAX TIP (DISPOSABLE) ×1
KIT BASIN OR (CUSTOM PROCEDURE TRAY) ×2 IMPLANT
KIT ROOM TURNOVER OR (KITS) ×2 IMPLANT
MANIFOLD NEPTUNE II (INSTRUMENTS) ×2 IMPLANT
NS IRRIG 1000ML POUR BTL (IV SOLUTION) ×2 IMPLANT
PACK ORTHO EXTREMITY (CUSTOM PROCEDURE TRAY) ×2 IMPLANT
PAD ARMBOARD 7.5X6 YLW CONV (MISCELLANEOUS) ×4 IMPLANT
PAD NEG PRESSURE SENSATRAC (MISCELLANEOUS) ×2 IMPLANT
SET HNDPC FAN SPRY TIP SCT (DISPOSABLE) ×1 IMPLANT
SPONGE GAUZE 4X4 12PLY (GAUZE/BANDAGES/DRESSINGS) ×2 IMPLANT
SPONGE LAP 18X18 X RAY DECT (DISPOSABLE) ×2 IMPLANT
SPONGE LAP 4X18 X RAY DECT (DISPOSABLE) ×2 IMPLANT
STOCKINETTE IMPERVIOUS 9X36 MD (GAUZE/BANDAGES/DRESSINGS) ×2 IMPLANT
SUT ETHILON 2 0 PSLX (SUTURE) ×2 IMPLANT
SUT ETHILON O TP 1 (SUTURE) ×2 IMPLANT
TOWEL OR 17X24 6PK STRL BLUE (TOWEL DISPOSABLE) ×2 IMPLANT
TOWEL OR 17X26 10 PK STRL BLUE (TOWEL DISPOSABLE) ×6 IMPLANT
TUBE ANAEROBIC SPECIMEN COL (MISCELLANEOUS) IMPLANT
TUBE CONNECTING 12X1/4 (SUCTIONS) ×2 IMPLANT
UNDERPAD 30X30 INCONTINENT (UNDERPADS AND DIAPERS) ×4 IMPLANT
WATER STERILE IRR 1000ML POUR (IV SOLUTION) ×2 IMPLANT
YANKAUER SUCT BULB TIP NO VENT (SUCTIONS) ×2 IMPLANT

## 2011-12-28 NOTE — Progress Notes (Signed)
INITIAL ADULT NUTRITION ASSESSMENT Date: 12/28/2011   Time: 11:22 AM  Reason for Assessment: Education Consult  ASSESSMENT: Male 36 y.o.  Dx: SIRS (systemic inflammatory response syndrome)  Hx: No past medical history on file.  Related Meds:     . cefTRIAXone (ROCEPHIN)  IV  2 g Intravenous Q24H  . docusate sodium  100 mg Oral BID  . HYDROmorphone PCA 0.3 mg/mL   Intravenous Q4H  . insulin aspart  0-20 Units Subcutaneous TID WC  . insulin aspart  0-5 Units Subcutaneous QHS  . insulin aspart  12 Units Subcutaneous TID WC  . insulin glargine  22 Units Subcutaneous BID  . linagliptin  5 mg Oral Daily  . magnesium hydroxide  30 mL Oral Once  . polyethylene glycol  17 g Oral Daily  . warfarin  10 mg Oral ONCE-1800  . warfarin  15 mg Oral ONCE-1800  . warfarin   Does not apply Once  . Warfarin - Pharmacist Dosing Inpatient   Does not apply q1800  . DISCONTD: insulin aspart  0-20 Units Subcutaneous TID WC  . DISCONTD: insulin aspart  0-5 Units Subcutaneous QHS  . DISCONTD: insulin aspart  10 Units Subcutaneous TID WC  . DISCONTD: insulin aspart  6 Units Subcutaneous TID WC  . DISCONTD: piperacillin-tazobactam (ZOSYN)  IV  3.375 g Intravenous Q8H  . DISCONTD: vancomycin  1,500 mg Intravenous Q24H    Ht: 5\' 11"  (180.3 cm)  Wt: 384 lb 14.8 oz (174.6 kg)  Ideal Wt: 78.1 kg % Ideal Wt: 223%  Usual Wt: unknown % Usual Wt: ---  Body mass index is 53.69 kg/(m^2).  Food/Nutrition Related Hx: no triggers per admission nutrition screen  Labs:  CMP     Component Value Date/Time   NA 135 12/26/2011 0500   K 4.6 12/26/2011 0500   CL 102 12/26/2011 0500   CO2 25 12/26/2011 0500   GLUCOSE 226* 12/26/2011 0500   BUN 16 12/26/2011 0500   CREATININE 1.27 12/26/2011 0500   CALCIUM 8.1* 12/26/2011 0500   PROT 8.4* 12/21/2011 2342   ALBUMIN 1.5* 12/21/2011 2342   AST 15 12/21/2011 2342   ALT 12 12/21/2011 2342   ALKPHOS 146* 12/21/2011 2342   BILITOT 0.2* 12/21/2011 2342   GFRNONAA 73* 12/26/2011  0500   GFRAA 85* 12/26/2011 0500    I/O last 3 completed shifts: In: 2150 [P.O.:840; I.V.:660; IV Piggyback:650] Out: 1100 [Urine:1100] Total I/O In: 70 [P.O.:30; I.V.:40] Out: 250 [Urine:250]  CBG (last 3)   Basename 12/28/11 0821 12/27/11 2213 12/27/11 1746  GLUCAP 126* 97 128*    Diet Order: NPO  Supplements/Tube Feeding: N/A  IVF:    sodium chloride Last Rate: 20 mL/hr at 12/28/11 0549    Estimated Nutritional Needs:   Kcal: 2000-2200 Protein: 110-120 Fluid: 2.0-2.2 L  RD spoke with pt re: nutrition hx -- pt reports poor appetite PTA for approximately 2 weeks with nausea & vomiting -- he was consuming chicken noodle soup and plain baked chicken; does not know if he's had weight loss; RD suspects some level of malnutrition; s/p I & D of left diabetic foot wound 3/11; going back to OR again today; PO intake prior to NPO status at 50-100% per flowsheet records  Provided pt with Carbohydrate Counting for People with Diabetes handouts from [employer] -- pt prefers review of handouts with his Mom who was not present during this RD's visitation   NUTRITION DIAGNOSIS: -Increased nutrient needs (NI-5.1).  Status: Ongoing  RELATED TO: wound healing  AS EVIDENCE BY: estimated nutrition needs  MONITORING/EVALUATION(Goals): Goal: meet >90% of estimated nutrition needs to promote wound healing Monitor: PO intake, weight, labs, I/O's  EDUCATION NEEDS: -Education not appropriate at this time  INTERVENTION:  Advance diet as medically appropriate to Carbohydrate Modified High Calorie  Review DM education handouts with Mom at more appropriate time  RD to follow, add supplementation accordingly  Dietitian #: [phone]  DOCUMENTATION CODES Per approved criteria  -Morbid Obesity    Alger Memos 12/28/2011, 11:22 AM

## 2011-12-28 NOTE — Anesthesia Postprocedure Evaluation (Signed)
Anesthesia Post Note  Patient: Adrian Khan  Procedure(s) Performed: Procedure(s) (LRB): IRRIGATION AND DEBRIDEMENT EXTREMITY (Left)  Anesthesia type: GA  Patient location: PACU  Post pain: Pain level controlled  Post assessment: Post-op Vital signs reviewed  Last Vitals:  Filed Vitals:   12/28/11 1415  BP:   Pulse:   Temp: 36.1 C  Resp:     Post vital signs: Reviewed  Level of consciousness: sedated  Complications: No apparent anesthesia complications

## 2011-12-28 NOTE — Brief Op Note (Signed)
12/21/2011 - 12/28/2011  1:58 PM  PATIENT:  Adrian Khan  36 y.o. male  PRE-OPERATIVE DIAGNOSIS:  Infected Left Foot  POST-OPERATIVE DIAGNOSIS:   Infected Left Foot  PROCEDURE:  Procedure(s) (LRB): IRRIGATION AND DEBRIDEMENT EXTREMITY (Left)  SURGEON:  Surgeon(s) and Role:    * Kennieth Rad, MD - Primary  PHYSICIAN ASSISTANT:   ASSISTANTS: none   ANESTHESIA:   general  EBL:  Total I/O In: 70 [P.O.:30; I.V.:40] Out: 250 [Urine:250]  BLOOD ADMINISTERED:none  DRAINS: none   LOCAL MEDICATIONS USED:  NONE  SPECIMEN:  No Specimen  DISPOSITION OF SPECIMEN:  N/A  COUNTS:  YES  TOURNIQUET:  * No tourniquets in log *  DICTATION: .Other Dictation: Dictation Number [id number]  PLAN OF CARE: Admit to inpatient   PATIENT DISPOSITION:  PACU - hemodynamically stable.   Delay start of Pharmacological VTE agent (>24hrs) due to surgical blood loss or risk of bleeding: yes

## 2011-12-28 NOTE — Progress Notes (Signed)
Placed pt. On CPAP via nasal mask 6.0 cm H20 (per pt. Comfort) with 2 lpm O2 bleed in.  Pt. Tolerating will at this time.

## 2011-12-28 NOTE — Transfer of Care (Signed)
Immediate Anesthesia Transfer of Care Note  Patient: Adrian Khan  Procedure(s) Performed: Procedure(s) (LRB): IRRIGATION AND DEBRIDEMENT EXTREMITY (Left)  Patient Location: PACU  Anesthesia Type: General  Level of Consciousness: awake, alert , oriented and sedated  Airway & Oxygen Therapy: Patient Spontanous Breathing and Patient connected to face mask oxygen  Post-op Assessment: Report given to PACU RN, Post -op Vital signs reviewed and stable and Patient moving all extremities  Post vital signs: Reviewed and stable  Complications: No apparent anesthesia complications

## 2011-12-28 NOTE — Preoperative (Signed)
Beta Blockers   Reason not to administer Beta Blockers:Not Applicable 

## 2011-12-28 NOTE — Consult Note (Signed)
[facility] - TEAM 1 - STEPDOWN/ICU TEAM CONSULT F/U NOTE  Subjective: Awake. Endorses no BM since Friday. No other complaints. Updated on culture results and plans to narrow antibiotic coverage.  Objective: Blood pressure 177/110, pulse 89, temperature 98.1 F (36.7 C), temperature source Oral, resp. rate 27, height 5\' 11"  (1.803 m), weight 174.6 kg (384 lb 14.8 oz), SpO2 99.00%.  Intake/Output from previous day: 03/11 0701 - 03/12 0700 In: 1940 [P.O.:840; I.V.:500; IV Piggyback:600] Out: 800 [Urine:800] Intake/Output this shift: Total I/O In: 70 [P.O.:30; I.V.:40] Out: 250 [Urine:250]  General appearance: alert, cooperative, appears stated age, no distress Resp: clear to auscultation bilaterally, no wheeze Cardio: regular rate and rhythm, no murmur, click, rub or gallop GI: morbidly obese - soft, non-tender; bowel sounds normal; no masses,  no organomegaly Extremities: L foot dressed and dry - R LE with 1+ edema Neurologic: Grossly normal  Lab Results:  Basename 12/28/11 0500 12/27/11 0520  WBC 20.6* 23.1*  HGB 7.9* 7.7*  HCT 24.6* 24.2*  PLT 537* 471*   BMET  Basename 12/26/11 0500  NA 135  K 4.6  CL 102  CO2 25  GLUCOSE 226*  BUN 16  CREATININE 1.27  CALCIUM 8.1*    Studies/Results: No results found.  Medications:  I have reviewed the patient's current medications.  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 Lynford Humphrey (primary team) *Zosyn and Vancomycin stopped in favor of Rocephin due to culture results  Fever *has defervesced  Hyponatremia/  Volume depletion - essentially resolved *combination pseudohyponatremia and volume depletion *sodium increasing with rehydration *follow lytes *control CBG  Acute and chronic respiratory failure with hypoxia/OBSTRUCTIVE SLEEP APNEA *continue daytime nasal cannula oxygen and QHS CPAP- will ask mom to bring in mask from home *ECHO with poor acoustic windows so unable to evaluate for diastolic dysfunction. Right ventricle mildly dilated. Otherwise normal systolic function.   DIABETES, TYPE 2 * better controlled today * Was on Januvia 100mg  BID at home  * Pharmacy with therapeutic substitution of Januvia with Tradjenta daily * Continue Lantus, SSI and meal covergae  Constipation * Adding Miralax and Colace; one time dose of MOM  Anemia due to chronic illness *hgb trending down w/ surgeries - transfuse only if drops below Hgb 7, or if symptomatic otherwise  Morbid obesity, BMI unknown *bariatric bed  DVT prophylaxis *pharmacy managing warfarin  Disposition *per primary team - we will continue to follow along with you - is medically stable for transfer to ortho bed   LOS: 7 days  12/28/2011, 11:37 AM  Junious Silk, ANP [facility] Office  [phone] Pager [phone]  On-Call/Text Page:      [email or web address]      password [email or web address]   I have examined the patient, reviewed the chart and discussed the plan with Susa Griffins, NP. I agree with the above note.   Calvert Cantor, MD [phone]

## 2011-12-28 NOTE — Progress Notes (Signed)
ANTIBIOTIC/ANTICOAGULATION CONSULT NOTE - FOLLOW UP  Pharmacy Consult for vancomycin/coumadin Indication: foot abscess/leukocytosis and vte prophylaxis    Assessment: 36 year old male on Day 7 vanc/zosyn for foot abscess. Blood cultures are NGTD x2. Abscess culture growing Klebsiella oxytoca (S to zosyn, cefazolin, Septra). Afebrile, WBC 20.6 (stable)  Vancomycin level 3/9 was 12.4-->25.9 (after holding for ~24hours).  Estimated Ke=0.03, t1/2~24hours. Dose was changed to 1500mg  q24h.  Patient also on coumadin for vte prophylaxis and INR today is therapeutic at 2.28 after 5 days of warfarin. No bleeding issues reported and CBC appears stable.   Goal of Therapy:  Vancomycin trough level 15-20 mcg/ml INR 2-3  Plan:  1. Coumadin 10mg  po x 1 today, check INR in AM 2. Continue vancomycin 1500mg  q24 hours. Follow patient closely for improved renal function and need for change in dose. Will obtain troughs when necessary.  Bmet in AM. 3. Continue zosyn 3.375gm IV q8h (extended infusion)  Celedonio Miyamoto, PharmD, [facility] Clinical Pharmacist Pager [phone]   12/28/2011 9:37 AM    Allergies  Allergen Reactions  . Sulfonamide Derivatives     REACTION: cramps    Patient Measurements: Height: 5\' 11"  (180.3 cm) Weight: 384 lb 14.8 oz (174.6 kg) IBW/kg (Calculated) : 75.3   Vital Signs: Temp: 98.1 F (36.7 C) (03/12 0818) Temp src: Oral (03/12 0818) BP: 177/110 mmHg (03/12 0818) Pulse Rate: 89  (03/12 0818) Intake/Output from previous day: 03/11 0701 - 03/12 0700 In: 1940 [P.O.:840; I.V.:500; IV Piggyback:600] Out: 800 [Urine:800] Intake/Output from this shift: Total I/O In: 70 [P.O.:30; I.V.:40] Out: 250 [Urine:250]  Labs:  [facility] 12/28/11 0500 12/27/11 0520 12/26/11 0500  WBC 20.6* 23.1* 21.0*  HGB 7.9* 7.7* 7.6*  PLT 537* 471* 465*  LABCREA -- -- --  CREATININE -- -- 1.27   Estimated Creatinine Clearance: 134.6 ml/min (by C-G formula based on Cr of 1.27). No results  found for this basename: VANCOTROUGH:2,VANCOPEAK:2,VANCORANDOM:2,GENTTROUGH:2,GENTPEAK:2,GENTRANDOM:2,TOBRATROUGH:2,TOBRAPEAK:2,TOBRARND:2,AMIKACINPEAK:2,AMIKACINTROU:2,AMIKACIN:2, in the last 72 hours   Microbiology: Recent Results (from the past 720 hour(s))  CULTURE, BLOOD (ROUTINE X 2)     Status: Normal (Preliminary result)   Collection Time   12/21/11 11:46 PM      Component Value Range Status Comment   Specimen Description BLOOD RIGHT ARM   Final    Special Requests BOTTLES DRAWN AEROBIC AND ANAEROBIC 5CC   Final    Culture  Setup Time [id number]   Final    Culture     Final    Value:        BLOOD CULTURE RECEIVED NO GROWTH TO DATE CULTURE WILL BE HELD FOR 5 DAYS BEFORE ISSUING A FINAL NEGATIVE REPORT   Report Status PENDING   Incomplete   CULTURE, BLOOD (ROUTINE X 2)     Status: Normal (Preliminary result)   Collection Time   12/21/11 11:56 PM      Component Value Range Status Comment   Specimen Description BLOOD RIGHT HAND   Final    Special Requests BOTTLES DRAWN AEROBIC AND ANAEROBIC 5CC   Final    Culture  Setup Time [id number]   Final    Culture     Final    Value:        BLOOD CULTURE RECEIVED NO GROWTH TO DATE CULTURE WILL BE HELD FOR 5 DAYS BEFORE ISSUING A FINAL NEGATIVE REPORT   Report Status PENDING   Incomplete   SURGICAL PCR SCREEN     Status: Normal   Collection Time   12/22/11  7:41 AM  Component Value Range Status Comment   MRSA, PCR NEGATIVE  NEGATIVE  Final    Staphylococcus aureus NEGATIVE  NEGATIVE  Final   ANAEROBIC CULTURE     Status: Normal   Collection Time   12/22/11  9:22 AM      Component Value Range Status Comment   Specimen Description ABSCESS FOOT LEFT   Final    Special Requests PT ON AUGMENTIN   Final    Gram Stain     Final    Value: FEW WBC PRESENT,BOTH PMN AND MONONUCLEAR     NO SQUAMOUS EPITHELIAL CELLS SEEN     ABUNDANT GRAM POSITIVE COCCI     IN PAIRS ABUNDANT GRAM VARIABLE ROD   Culture MULTIPLE ORGANISMS PRESENT, NONE  PREDOMINANT   Final    Report Status 12/27/2011 FINAL   Final   CULTURE, ROUTINE-ABSCESS     Status: Normal   Collection Time   12/22/11  9:22 AM      Component Value Range Status Comment   Specimen Description ABSCESS FOOT LEFT   Final    Special Requests PT ON AUGMENTIN   Final    Gram Stain     Final    Value: FEW WBC PRESENT,BOTH PMN AND MONONUCLEAR     NO SQUAMOUS EPITHELIAL CELLS SEEN     ABUNDANT GRAM POSITIVE COCCI     IN PAIRS ABUNDANT GRAM VARIABLE ROD   Culture ABUNDANT KLEBSIELLA OXYTOCA   Final    Report Status 12/25/2011 FINAL   Final    Organism ID, Bacteria KLEBSIELLA OXYTOCA   Final   URINE CULTURE     Status: Normal   Collection Time   12/23/11  5:05 AM      Component Value Range Status Comment   Specimen Description URINE, CLEAN CATCH   Final    Special Requests NONE   Final    Culture  Setup Time [id number]   Final    Colony Count NO GROWTH   Final    Culture NO GROWTH   Final    Report Status 12/24/2011 FINAL   Final     Anti-infectives     Start     Dose/Rate Route Frequency Ordered Stop   12/26/11 0930   polymyxin B 500,000 Units, bacitracin 50,000 Units in sodium chloride irrigation 0.9 % 500 mL irrigation  Status:  Discontinued          As needed 12/26/11 0930 12/26/11 1006   12/25/11 0900   vancomycin (VANCOCIN) 1,500 mg in sodium chloride 0.9 % 500 mL IVPB        1,500 mg 250 mL/hr over 120 Minutes Intravenous Every 24 hours 12/25/11 0757     12/24/11 0800   piperacillin-tazobactam (ZOSYN) IVPB 3.375 g  Status:  Discontinued        3.375 g 12.5 mL/hr over 240 Minutes Intravenous To Surgery 12/24/11 0728 12/24/11 0939   12/22/11 1830   vancomycin (VANCOCIN) 1,500 mg in sodium chloride 0.9 % 500 mL IVPB        1,500 mg 250 mL/hr over 120 Minutes Intravenous Every 12 hours 12/22/11 1822 12/23/11 1930   12/22/11 1700   piperacillin-tazobactam (ZOSYN) IVPB 3.375 g        3.375 g 12.5 mL/hr over 240 Minutes Intravenous Every 8 hours 12/22/11 1601       12/22/11 1330   vancomycin (VANCOCIN) IVPB 1000 mg/200 mL premix        1,000 mg 200 mL/hr over 60 Minutes Intravenous  Every 12 hours 12/22/11 1228 12/22/11 1510

## 2011-12-28 NOTE — Anesthesia Preprocedure Evaluation (Addendum)
Anesthesia Evaluation  Patient identified by MRN, date of birth, ID band Patient awake    Reviewed: Allergy & Precautions, H&P , Patient's Chart, lab work & pertinent test results, reviewed documented beta blocker date and time   History of Anesthesia Complications Negative for: history of anesthetic complications  Airway Mallampati: III TM Distance: >3 FB Neck ROM: full    Dental No notable dental hx.    Pulmonary neg pulmonary ROS, sleep apnea ,  breath sounds clear to auscultation  Pulmonary exam normal       Cardiovascular Exercise Tolerance: Good negative cardio ROS  Rhythm:regular Rate:Normal     Neuro/Psych negative neurological ROS  negative psych ROS   GI/Hepatic negative GI ROS, Neg liver ROS,   Endo/Other  negative endocrine ROSDiabetes mellitus-Morbid obesity  Renal/GU negative Renal ROS     Musculoskeletal   Abdominal   Peds  Hematology negative hematology ROS (+)   Anesthesia Other Findings   Reproductive/Obstetrics negative OB ROS                           Anesthesia Physical Anesthesia Plan  ASA: III  Anesthesia Plan: General ETT   Post-op Pain Management:    Induction:   Airway Management Planned:   Additional Equipment:   Intra-op Plan:   Post-operative Plan:   Informed Consent: I have reviewed the patients History and Physical, chart, labs and discussed the procedure including the risks, benefits and alternatives for the proposed anesthesia with the patient or authorized representative who has indicated his/her understanding and acceptance.   Dental Advisory Given  Plan Discussed with: CRNA, Surgeon and Anesthesiologist  Anesthesia Plan Comments:        Anesthesia Quick Evaluation

## 2011-12-29 LAB — GLUCOSE, CAPILLARY
Glucose-Capillary: 136 mg/dL — ABNORMAL HIGH (ref 70–99)
Glucose-Capillary: 126 mg/dL — ABNORMAL HIGH (ref 70–99)
Glucose-Capillary: 103 mg/dL — ABNORMAL HIGH (ref 70–99)

## 2011-12-29 LAB — PROTIME-INR: INR: 2.88 — ABNORMAL HIGH (ref 0.00–1.49)

## 2011-12-29 MED ORDER — WARFARIN SODIUM 7.5 MG PO TABS
7.5000 mg | ORAL_TABLET | Freq: Once | ORAL | Status: AC
  Administered 2011-12-29: 7.5 mg via ORAL
  Filled 2011-12-29 (×2): qty 1

## 2011-12-29 MED ORDER — HYDROMORPHONE 0.3 MG/ML IV SOLN
INTRAVENOUS | Status: AC
  Filled 2011-12-29: qty 25

## 2011-12-29 NOTE — Consult Note (Signed)
[facility] - TEAM 1 - STEPDOWN/ICU TEAM CONSULT F/U NOTE  Subjective: Still no BM despite MOM and Miralax/colace started 3/12. Wants to get OOB.  Denis CP, SOB, f/c, HA.    Objective: Blood pressure 137/80, pulse 90, temperature 98 F (36.7 C), temperature source Oral, resp. rate 24, height 5\' 11"  (1.803 m), weight 174.6 kg (384 lb 14.8 oz), SpO2 100.00%.  Intake/Output from previous day: 03/12 0701 - 03/13 0700 In: 1149 [P.O.:380; I.V.:719; IV Piggyback:50] Out: 1225 [Urine:1225] Intake/Output this shift: Total I/O In: 650 [P.O.:600; IV Piggyback:50] Out: 425 [Urine:425]  General appearance: alert, cooperative, appears stated age, no distress Resp: clear to auscultation bilaterally, no wheeze Cardio: regular rate and rhythm, no murmur, click, rub or gallop GI: morbidly obese - soft, non-tender; bowel sounds normal; no masses,  no organomegaly Extremities: L foot dressed and dry - R LE with 1+ edema Neurologic: Grossly normal  Lab Results:  Basename 12/28/11 0500 12/27/11 0520  WBC 20.6* 23.1*  HGB 7.9* 7.7*  HCT 24.6* 24.2*  PLT 537* 471*   Studies/Results: No results found.  Medications:  I have reviewed the patient's current medications.  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 Lynford Humphrey (primary team) *Zosyn and Vancomycin stopped in favor of Rocephin due to culture results  Fever *has defervesced  Hyponatremia/ Volume depletion  *combination pseudohyponatremia and volume depletion *sodium increased after rehydration  Acute and chronic respiratory failure with hypoxia/OBSTRUCTIVE SLEEP APNEA *continue daytime nasal cannula oxygen and QHS CPAP- will ask mom to bring in mask from home *ECHO with poor acoustic windows  so unable to evaluate for diastolic dysfunction. Right ventricle mildly dilated. Otherwise normal systolic function.   DIABETES, TYPE 2 * better controlled  * Was on Januvia 100mg  BID at home  * Pharmacy with therapeutic substitution of Januvia with Tradjenta daily * Continue Lantus, SSI and meal coverage  Constipation * Continue Miralax and Colace; one time dose of MOM * Encourage mobilization when OK with orthopedics  Anemia due to chronic illness *hgb trending down w/ surgeries - transfuse only if drops below Hgb 7, or if symptomatic otherwise  Morbid obesity, BMI unknown *bariatric bed  DVT prophylaxis *pharmacy managing warfarin  Disposition *per primary team - we will continue to follow along with you - is medically stable for transfer to ortho bed   LOS: 8 days  12/29/2011, 12:09 PM  Junious Silk, ANP [facility] Office  [phone] Pager [phone]  On-Call/Text Page:      [email or web address]      password [email or web address]  I have personally examined this patient and reviewed the entire database. I have reviewed the above note, made any necessary editorial changes, and agree with its content.  Lonia Blood, MD [facility]

## 2011-12-29 NOTE — Progress Notes (Signed)
RN placed pt. On CPAP via nasal mask, 6.0 cm H20 (per pt. Comfort) with 2 lpm O2 bleed in.

## 2011-12-29 NOTE — Progress Notes (Signed)
Attempted to help pt get up and sit on the [place], but was unable to. Pt said that he is too weak and cannot put weight on his legs.

## 2011-12-29 NOTE — Progress Notes (Signed)
Subjective: FEELING BETTER SAT UP TODAY FOR THE FIRST TIME.  Objective: Vital signs in last 24 hours: Temp:  [98 F (36.7 C)-99.4 F (37.4 C)] 98.6 F (37 C) (03/13 1700) Pulse Rate:  [90-102] 98  (03/13 1700) Resp:  [20-31] 20  (03/13 1700) BP: (114-164)/(55-100) 151/87 mmHg (03/13 1700) SpO2:  [94 %-100 %] 99 % (03/13 1700) FiO2 (%):  [4 %] 4 % (03/13 1114)  Intake/Output from previous day: 03/12 0701 - 03/13 0700 In: 1149 [P.O.:380; I.V.:719; IV Piggyback:50] Out: 1225 [Urine:1225] Intake/Output this shift:     Basename 12/28/11 0500 12/27/11 0520  HGB 7.9* 7.7*    Basename 12/28/11 0500 12/27/11 0520  WBC 20.6* 23.1*  RBC 2.88* 2.83*  HCT 24.6* 24.2*  PLT 537* 471*   No results found for this basename: NA:2,K:2,CL:2,CO2:2,BUN:2,CREATININE:2,GLUCOSE:2,CALCIUM:2 in the last 72 hours  Basename 12/29/11 0500 12/28/11 0500  LABPT -- --  INR 2.88* 2.28*    Neurologically intact  Assessment/Plan  Patient stable enough to be transferred to 5000 tomorrow, then will plan on EC STAY. DO NOT FEEL PATIENT WOULD DO WELL AT HOME. CONTINUE WOUND VAC TREATMENT.   Kennieth Rad 12/29/2011, 7:47 PM

## 2011-12-29 NOTE — Progress Notes (Signed)
Pt refuses to wear CPAP at this time. No distress noted. Pt attempted to wear previous night but could only tolerate for approximately one hour. Pt encouraged to call RT if pt changes mind. RN aware.

## 2011-12-29 NOTE — Op Note (Signed)
NAMEARLING, CERONE NO.:  [id number]  MEDICAL RECORD NO.:  [id number]  LOCATION:  2610                         FACILITY:  [facility]  PHYSICIAN:  Myrtie Neither, MD      DATE OF BIRTH:  September 25, 1976  DATE OF PROCEDURE: DATE OF DISCHARGE:                              OPERATIVE REPORT   PREOPERATIVE DIAGNOSIS:  Open wound, diabetic left foot infection.  POSTOPERATIVE DIAGNOSIS:  Open wound, diabetic left foot infection.  ANESTHESIA:  General.  PROCEDURES: 1. Debridement, left foot. 2. Application of wound VAC.  SURGEON:  Myrtie Neither, MD  The patient was taken to the operating room.  After giving adequate preop medications and giving general anesthesia and intubated, left foot was scrubbed with Betadine scrub and paint, Betadine solution, and draped in sterile manner.  Local debridement of wounds dorsally as well as plantar aspect was done.  Eschar was resected at dorsal lateral aspect of the forefoot.  Wound bed both dorsal as well as plantar aspect of the leg quite well with good pink tissue around the edges.  Local debridement about the eschar as well as dorsal lateral aspect of the forefoot was done.  After adequate debridement both dorsally and laterally, inspection of the wound itself did not reveal any frank pus. The irrigation was done.  Partial wound closure of the dorsal wound which was approximately 3-1/2 inches long was done with #2 Nylon and partial wound closure and on the plantar aspect of the foot was also done.  Wound VAC was then applied at 125 and testing did not reveal any leaks.  The patient tolerated the procedure quite well, went to recovery room in stable and satisfactory condition.     Myrtie Neither, MD     AC/MEDQ  D:  12/28/2011  T:  12/29/2011  Job:  [id number]

## 2011-12-29 NOTE — Progress Notes (Signed)
ANTICOAGULATION CONSULT NOTE - FOLLOW UP  Pharmacy Consult for coumadin Indication: vte prophylaxis    Assessment: 36 year old male on Day 8 abx for foot abscess. Blood cultures are NGTD x2. Abscess culture growing Klebsiella oxytoca (S to zosyn, cefazolin, Septra). Afebrile, WBC 20.6 (stable)  S/p vanc/zosyn --> Rocephin   Patient also on coumadin for vte prophylaxis and INR today is therapeutic at 2.88 after 5 days of warfarin. No bleeding issues reported and CBC appears stable.   Goal of Therapy:   INR 2-3  Plan:  1. Coumadin 7.5mg  po x 1 today, check INR in AM 2. F/u with INR in AM  12/29/2011 9:38 AM    Allergies  Allergen Reactions  . Sulfonamide Derivatives     REACTION: cramps    Patient Measurements: Height: 5\' 11"  (180.3 cm) Weight: 384 lb 14.8 oz (174.6 kg) IBW/kg (Calculated) : 75.3   Vital Signs: Temp: 98.4 F (36.9 C) (03/13 0736) Temp src: Oral (03/13 0736) BP: 150/94 mmHg (03/13 0736) Pulse Rate: 90  (03/13 0736) Intake/Output from previous day: 03/12 0701 - 03/13 0700 In: 1149 [P.O.:380; I.V.:719; IV Piggyback:50] Out: 1225 [Urine:1225] Intake/Output from this shift: Total I/O In: 650 [P.O.:600; IV Piggyback:50] Out: 425 [Urine:425]  Labs:  [facility] 12/28/11 0500 12/27/11 0520  WBC 20.6* 23.1*  HGB 7.9* 7.7*  PLT 537* 471*  LABCREA -- --  CREATININE -- --   Estimated Creatinine Clearance: 134.6 ml/min (by C-G formula based on Cr of 1.27). No results found for this basename: VANCOTROUGH:2,VANCOPEAK:2,VANCORANDOM:2,GENTTROUGH:2,GENTPEAK:2,GENTRANDOM:2,TOBRATROUGH:2,TOBRAPEAK:2,TOBRARND:2,AMIKACINPEAK:2,AMIKACINTROU:2,AMIKACIN:2, in the last 72 hours   Microbiology: Recent Results (from the past 720 hour(s))  CULTURE, BLOOD (ROUTINE X 2)     Status: Normal   Collection Time   12/21/11 11:46 PM      Component Value Range Status Comment   Specimen Description BLOOD RIGHT ARM   Final    Special Requests BOTTLES DRAWN AEROBIC AND ANAEROBIC  5CC   Final    Culture  Setup Time [id number]   Final    Culture NO GROWTH 5 DAYS   Final    Report Status 12/28/2011 FINAL   Final   CULTURE, BLOOD (ROUTINE X 2)     Status: Normal   Collection Time   12/21/11 11:56 PM      Component Value Range Status Comment   Specimen Description BLOOD RIGHT HAND   Final    Special Requests BOTTLES DRAWN AEROBIC AND ANAEROBIC 5CC   Final    Culture  Setup Time [id number]   Final    Culture NO GROWTH 5 DAYS   Final    Report Status 12/28/2011 FINAL   Final   SURGICAL PCR SCREEN     Status: Normal   Collection Time   12/22/11  7:41 AM      Component Value Range Status Comment   MRSA, PCR NEGATIVE  NEGATIVE  Final    Staphylococcus aureus NEGATIVE  NEGATIVE  Final   ANAEROBIC CULTURE     Status: Normal   Collection Time   12/22/11  9:22 AM      Component Value Range Status Comment   Specimen Description ABSCESS FOOT LEFT   Final    Special Requests PT ON AUGMENTIN   Final    Gram Stain     Final    Value: FEW WBC PRESENT,BOTH PMN AND MONONUCLEAR     NO SQUAMOUS EPITHELIAL CELLS SEEN     ABUNDANT GRAM POSITIVE COCCI     IN PAIRS ABUNDANT GRAM VARIABLE  ROD   Culture MULTIPLE ORGANISMS PRESENT, NONE PREDOMINANT   Final    Report Status 12/27/2011 FINAL   Final   CULTURE, ROUTINE-ABSCESS     Status: Normal   Collection Time   12/22/11  9:22 AM      Component Value Range Status Comment   Specimen Description ABSCESS FOOT LEFT   Final    Special Requests PT ON AUGMENTIN   Final    Gram Stain     Final    Value: FEW WBC PRESENT,BOTH PMN AND MONONUCLEAR     NO SQUAMOUS EPITHELIAL CELLS SEEN     ABUNDANT GRAM POSITIVE COCCI     IN PAIRS ABUNDANT GRAM VARIABLE ROD   Culture ABUNDANT KLEBSIELLA OXYTOCA   Final    Report Status 12/25/2011 FINAL   Final    Organism ID, Bacteria KLEBSIELLA OXYTOCA   Final   URINE CULTURE     Status: Normal   Collection Time   12/23/11  5:05 AM      Component Value Range Status Comment   Specimen Description URINE,  CLEAN CATCH   Final    Special Requests NONE   Final    Culture  Setup Time [id number]   Final    Colony Count NO GROWTH   Final    Culture NO GROWTH   Final    Report Status 12/24/2011 FINAL   Final     Anti-infectives     Start     Dose/Rate Route Frequency Ordered Stop   12/28/11 1000   cefTRIAXone (ROCEPHIN) 2 g in dextrose 5 % 50 mL IVPB        2 g 100 mL/hr over 30 Minutes Intravenous Every 24 hours 12/28/11 0946     12/26/11 0930   polymyxin B 500,000 Units, bacitracin 50,000 Units in sodium chloride irrigation 0.9 % 500 mL irrigation  Status:  Discontinued          As needed 12/26/11 0930 12/26/11 1006   12/25/11 0900   vancomycin (VANCOCIN) 1,500 mg in sodium chloride 0.9 % 500 mL IVPB  Status:  Discontinued        1,500 mg 250 mL/hr over 120 Minutes Intravenous Every 24 hours 12/25/11 0757 12/28/11 0945   12/24/11 0800   piperacillin-tazobactam (ZOSYN) IVPB 3.375 g  Status:  Discontinued        3.375 g 12.5 mL/hr over 240 Minutes Intravenous To Surgery 12/24/11 0728 12/24/11 0939   12/22/11 1830   vancomycin (VANCOCIN) 1,500 mg in sodium chloride 0.9 % 500 mL IVPB        1,500 mg 250 mL/hr over 120 Minutes Intravenous Every 12 hours 12/22/11 1822 12/23/11 1930   12/22/11 1700   piperacillin-tazobactam (ZOSYN) IVPB 3.375 g  Status:  Discontinued        3.375 g 12.5 mL/hr over 240 Minutes Intravenous Every 8 hours 12/22/11 1601 12/28/11 0946   12/22/11 1330   vancomycin (VANCOCIN) IVPB 1000 mg/200 mL premix        1,000 mg 200 mL/hr over 60 Minutes Intravenous Every 12 hours 12/22/11 1228 12/22/11 1510

## 2011-12-29 NOTE — Progress Notes (Signed)
Pt. Refuses CPAP at this time.

## 2011-12-30 ENCOUNTER — Encounter (HOSPITAL_COMMUNITY): Payer: Self-pay | Admitting: Nephrology

## 2011-12-30 DIAGNOSIS — F411 Generalized anxiety disorder: Secondary | ICD-10-CM | POA: Diagnosis present

## 2011-12-30 LAB — GLUCOSE, CAPILLARY
Glucose-Capillary: 101 mg/dL — ABNORMAL HIGH (ref 70–99)
Glucose-Capillary: 140 mg/dL — ABNORMAL HIGH (ref 70–99)
Glucose-Capillary: 101 mg/dL — ABNORMAL HIGH (ref 70–99)
Glucose-Capillary: 69 mg/dL — ABNORMAL LOW (ref 70–99)

## 2011-12-30 LAB — PROTIME-INR
INR: 2.96 — ABNORMAL HIGH (ref 0.00–1.49)
Prothrombin Time: 31.3 seconds — ABNORMAL HIGH (ref 11.6–15.2)

## 2011-12-30 MED ORDER — INSULIN ASPART 100 UNIT/ML ~~LOC~~ SOLN
0.0000 [IU] | Freq: Every day | SUBCUTANEOUS | Status: DC
  Filled 2011-12-30: qty 0.05

## 2011-12-30 MED ORDER — THIAMINE HCL 100 MG/ML IJ SOLN
100.0000 mg | Freq: Every day | INTRAMUSCULAR | Status: DC
  Filled 2011-12-30 (×5): qty 1

## 2011-12-30 MED ORDER — LORAZEPAM 1 MG PO TABS: 1.0000 mg | ORAL_TABLET | Freq: Four times a day (QID) | ORAL | Status: AC | PRN

## 2011-12-30 MED ORDER — INSULIN ASPART 100 UNIT/ML ~~LOC~~ SOLN
0.0000 [IU] | Freq: Three times a day (TID) | SUBCUTANEOUS | Status: DC
  Filled 2011-12-30: qty 3

## 2011-12-30 MED ORDER — INSULIN ASPART 100 UNIT/ML ~~LOC~~ SOLN
12.0000 [IU] | Freq: Three times a day (TID) | SUBCUTANEOUS | Status: DC
  Administered 2011-12-31: 12 [IU] via SUBCUTANEOUS
  Filled 2011-12-30: qty 0.12

## 2011-12-30 MED ORDER — INSULIN GLARGINE 100 UNIT/ML ~~LOC~~ SOLN: 10.0000 [IU] | Freq: Two times a day (BID) | SUBCUTANEOUS | Status: DC

## 2011-12-30 MED ORDER — VITAMIN B-1 100 MG PO TABS
100.0000 mg | ORAL_TABLET | Freq: Every day | ORAL | Status: DC
  Administered 2011-12-30 – 2012-01-05 (×7): 100 mg via ORAL
  Filled 2011-12-30 (×7): qty 1

## 2011-12-30 MED ORDER — INSULIN GLARGINE 100 UNIT/ML ~~LOC~~ SOLN
22.0000 [IU] | Freq: Every day | SUBCUTANEOUS | Status: DC
  Administered 2011-12-30: 22 [IU] via SUBCUTANEOUS
  Filled 2011-12-30: qty 3

## 2011-12-30 MED ORDER — LORAZEPAM 2 MG/ML IJ SOLN: 1.0000 mg | Freq: Four times a day (QID) | INTRAMUSCULAR | Status: AC | PRN

## 2011-12-30 MED ORDER — WARFARIN SODIUM 7.5 MG PO TABS
7.5000 mg | ORAL_TABLET | Freq: Once | ORAL | Status: AC
  Administered 2011-12-30: 7.5 mg via ORAL
  Filled 2011-12-30: qty 1

## 2011-12-30 MED ORDER — OXYCODONE-ACETAMINOPHEN 5-325 MG PO TABS
2.0000 | ORAL_TABLET | ORAL | Status: DC | PRN
  Administered 2011-12-30: 1 via ORAL
  Administered 2011-12-31 – 2012-01-05 (×7): 2 via ORAL
  Filled 2011-12-30: qty 2
  Filled 2011-12-30: qty 1
  Filled 2011-12-30: qty 2
  Filled 2011-12-30: qty 1
  Filled 2011-12-30 (×4): qty 2
  Filled 2011-12-30: qty 1

## 2011-12-30 MED ORDER — FOLIC ACID 1 MG PO TABS
1.0000 mg | ORAL_TABLET | Freq: Every day | ORAL | Status: DC
  Administered 2011-12-30 – 2012-01-05 (×7): 1 mg via ORAL
  Filled 2011-12-30 (×7): qty 1

## 2011-12-30 MED ORDER — ADULT MULTIVITAMIN W/MINERALS CH
1.0000 | ORAL_TABLET | Freq: Every day | ORAL | Status: DC
  Administered 2011-12-30 – 2012-01-05 (×7): 1 via ORAL
  Filled 2011-12-30 (×7): qty 1

## 2011-12-30 MED ORDER — ALPRAZOLAM 0.5 MG PO TABS
1.0000 mg | ORAL_TABLET | Freq: Three times a day (TID) | ORAL | Status: DC | PRN
  Administered 2011-12-31 – 2012-01-02 (×3): 1 mg via ORAL
  Filled 2011-12-30 (×3): qty 2

## 2011-12-30 MED ORDER — INSULIN ASPART 100 UNIT/ML ~~LOC~~ SOLN
0.0000 [IU] | Freq: Three times a day (TID) | SUBCUTANEOUS | Status: DC
  Administered 2011-12-31 – 2012-01-03 (×3): 2 [IU] via SUBCUTANEOUS
  Administered 2012-01-03: 3 [IU] via SUBCUTANEOUS
  Administered 2012-01-03 – 2012-01-05 (×5): 2 [IU] via SUBCUTANEOUS

## 2011-12-30 MED ORDER — INSULIN GLARGINE 100 UNIT/ML ~~LOC~~ SOLN
22.0000 [IU] | Freq: Two times a day (BID) | SUBCUTANEOUS | Status: DC
  Filled 2011-12-30 (×7): qty 3

## 2011-12-30 NOTE — Anesthesia Postprocedure Evaluation (Signed)
  Anesthesia Post-op Note  Patient: Adrian Khan  Procedure(s) Performed: Procedure(s) (LRB): IRRIGATION AND DEBRIDEMENT EXTREMITY (Left)  Patient Location: PACU  Anesthesia Type: General  Level of Consciousness: awake, alert  and oriented  Airway and Oxygen Therapy: Patient Spontanous Breathing and Patient connected to nasal cannula oxygen  Post-op Pain: none  Post-op Assessment: Post-op Vital signs reviewed  Post-op Vital Signs: Reviewed and stable  Complications: No apparent anesthesia complications

## 2011-12-30 NOTE — Consult Note (Addendum)
Pt. Has a history of hypogonadism he has low testosterone levels.  He has been on testin.   Ck. Level.

## 2011-12-30 NOTE — Addendum Note (Signed)
Addendum  created 12/30/11 1320 by Carmela Rima, CRNA   Modules edited:Notes Section

## 2011-12-30 NOTE — Progress Notes (Signed)
Subjective: Patient quite anxious. He states that he is very afraid of subcutaneous needles and is refusing insulin. He says he is happy to get insulin through his IV line, but otherwise does not want it. He feels quite agitated and physical all he wants to do is go home immediately. Denies any pain.  Objective: Weight change:   Intake/Output Summary (Last 24 hours) at 12/30/11 1754 Last data filed at 12/30/11 1400  Gross per 24 hour  Intake    600 ml  Output   1226 ml  Net   -626 ml    Filed Vitals:   12/30/11 1400  BP: 171/110  Pulse: 93  Temp: 98.5 F (36.9 C)  Resp: 20   General: Alert, oriented x3, anxious HEENT: Normocephalic, atraumatic, there are airway, mucous members are moist Cardiovascular: Regular rate and rhythm, borderline tachycardia Lungs: Clear to auscultation bilaterally-decreased breath sounds secondary to body habitus Abdomen: Soft, morbidly obese, nontender, hypoactive bowel sounds Extremities: 1+ edema on right lower extremity. On wound VAC Lab Results:   CBC:  Basename 12/28/11 0500  WBC 20.6*  NEUTROABS --  HGB 7.9*  HCT 24.6*  MCV 85.4  PLT 537*   CBG:  Basename 12/30/11 1609 12/30/11 1312 12/30/11 1101 12/30/11 0623 12/29/11 2200 12/29/11 1752  GLUCAP 69* 101* 140* 101* 105* 103*   Coagulation:  Basename 12/30/11 0530 12/29/11 0500  LABPROT 31.3* 30.6*  INR 2.96* 2.88*   Micro Results: Recent Results (from the past 240 hour(s))  CULTURE, BLOOD (ROUTINE X 2)     Status: Normal   Collection Time   12/21/11 11:46 PM      Component Value Range Status Comment   Specimen Description BLOOD RIGHT ARM   Final    Special Requests BOTTLES DRAWN AEROBIC AND ANAEROBIC 5CC   Final    Culture  Setup Time [id number]   Final    Culture NO GROWTH 5 DAYS   Final    Report Status 12/28/2011 FINAL   Final   CULTURE, BLOOD (ROUTINE X 2)     Status: Normal   Collection Time   12/21/11 11:56 PM      Component Value Range Status Comment   Specimen  Description BLOOD RIGHT HAND   Final    Special Requests BOTTLES DRAWN AEROBIC AND ANAEROBIC 5CC   Final    Culture  Setup Time [id number]   Final    Culture NO GROWTH 5 DAYS   Final    Report Status 12/28/2011 FINAL   Final   SURGICAL PCR SCREEN     Status: Normal   Collection Time   12/22/11  7:41 AM      Component Value Range Status Comment   MRSA, PCR NEGATIVE  NEGATIVE  Final    Staphylococcus aureus NEGATIVE  NEGATIVE  Final   ANAEROBIC CULTURE     Status: Normal   Collection Time   12/22/11  9:22 AM      Component Value Range Status Comment   Specimen Description ABSCESS FOOT LEFT   Final    Special Requests PT ON AUGMENTIN   Final    Gram Stain     Final    Value: FEW WBC PRESENT,BOTH PMN AND MONONUCLEAR     NO SQUAMOUS EPITHELIAL CELLS SEEN     ABUNDANT GRAM POSITIVE COCCI     IN PAIRS ABUNDANT GRAM VARIABLE ROD   Culture MULTIPLE ORGANISMS PRESENT, NONE PREDOMINANT   Final    Report Status 12/27/2011 FINAL   Final  CULTURE, ROUTINE-ABSCESS     Status: Normal   Collection Time   12/22/11  9:22 AM      Component Value Range Status Comment   Specimen Description ABSCESS FOOT LEFT   Final    Special Requests PT ON AUGMENTIN   Final    Gram Stain     Final    Value: FEW WBC PRESENT,BOTH PMN AND MONONUCLEAR     NO SQUAMOUS EPITHELIAL CELLS SEEN     ABUNDANT GRAM POSITIVE COCCI     IN PAIRS ABUNDANT GRAM VARIABLE ROD   Culture ABUNDANT KLEBSIELLA OXYTOCA   Final    Report Status 12/25/2011 FINAL   Final    Organism ID, Bacteria KLEBSIELLA OXYTOCA   Final   URINE CULTURE     Status: Normal   Collection Time   12/23/11  5:05 AM      Component Value Range Status Comment   Specimen Description URINE, CLEAN CATCH   Final    Special Requests NONE   Final    Culture  Setup Time [id number]   Final    Colony Count NO GROWTH   Final    Culture NO GROWTH   Final    Report Status 12/24/2011 FINAL   Final     Studies/Results:Medications: Scheduled Meds:   . cefTRIAXone  (ROCEPHIN)  IV  2 g Intravenous Q24H  . docusate sodium  100 mg Oral BID  . insulin aspart  0-20 Units Subcutaneous TID WC  . insulin aspart  0-5 Units Subcutaneous QHS  . insulin aspart  12 Units Subcutaneous TID WC  . insulin glargine  22 Units Subcutaneous BID  . linagliptin  5 mg Oral Daily  . polyethylene glycol  17 g Oral Daily  . warfarin  7.5 mg Oral ONCE-1800  . warfarin  7.5 mg Oral ONCE-1800  . warfarin   Does not apply Once  . Warfarin - Pharmacist Dosing Inpatient   Does not apply q1800  . DISCONTD: HYDROmorphone PCA 0.3 mg/mL   Intravenous Q4H  . DISCONTD: insulin glargine  22 Units Subcutaneous BID   Continuous Infusions:   . sodium chloride 20 mL/hr (12/28/11 2226)   PRN Meds:.acetaminophen, acetaminophen, ALPRAZolam, hydrALAZINE, menthol-cetylpyridinium, metoCLOPramide (REGLAN) injection, metoCLOPramide, ondansetron (ZOFRAN) IV, ondansetron, oxyCODONE-acetaminophen, phenol, sodium chloride, DISCONTD: ALPRAZolam  Assessment/Plan: Patient Active Hospital Problem List: SIRS (systemic inflammatory response syndrome) (12/23/2011)  stable. On antibiotics pending wound cultures positive for Klebsiella  DIABETES, TYPE 2 (02/23/2010)  spoke with pharmacy and they have in stock Lantus and NovoLog pens which the patient is amenable to using rather than syringes. Noted hypoglycemia, likely from poor by mouth intake from anxiety and recovery from surgery. Decrease the Lantus to 10 units subcutaneous every 12 hours rather than 22 every 12 hours.  OBSTRUCTIVE SLEEP APNEA (08/19/2010)  patient refuses CPAP.  Leukocytosis (12/22/2011)  improving.  Foot abscess (12/22/2011)  managed by orthopedic surgery-primary team. I've antibiotics on Rocephin. Now on wound VAC.  Anemia due to chronic illness (12/22/2011)  stable.  Hyponatremia (12/22/2011)  improved with rehydration-felt to be combination of volume depletion plus pseudohyponatremia  Volume depletion (12/23/2011)  as above.  Morbid  obesity, BMI unknown (12/23/2011)  Acute and chronic respiratory failure with hypoxia (12/23/2011)  stable.  Anxiety state (12/30/2011)  will try one-time low dose of IV Ativan and increased frequency of when necessary Xanax to every 8 hours.   LOS: 9 days   Adrian Khan 12/30/2011, 5:54 PM

## 2011-12-30 NOTE — Transfer of Care (Signed)
Immediate Anesthesia Transfer of Care Note  Patient: Adrian Khan  Procedure(s) Performed: Procedure(s) (LRB): IRRIGATION AND DEBRIDEMENT EXTREMITY (Left)  Patient Location: PACU  Anesthesia Type: General  Level of Consciousness: awake, oriented and patient cooperative  Airway & Oxygen Therapy: Patient Spontanous Breathing  Post-op Assessment: Report given to PACU RN  Post vital signs: Reviewed  Complications: No apparent anesthesia complications

## 2011-12-30 NOTE — Progress Notes (Signed)
PT Cancellation Note  Treatment cancelled today due to patient's refusal to participate. Pt stated that he has a low blood sugar and does not want to move as they just gave him stool softeners. Pt stated that he will attempt to get up to side of bed with RN later if feeling better and he will participate in therapy tomorrow. Will attempt evaluation tomorrow.  Thanks, 12/30/2011 Milana Kidney DPT PAGER: [phone] OFFICE: [phone]    Milana Kidney 12/30/2011, 4:32 PM

## 2011-12-30 NOTE — Progress Notes (Signed)
ANTICOAGULATION CONSULT NOTE - Follow Up Consult  Pharmacy Consult for coumadin Indication: VTE prophylaxis  Allergies  Allergen Reactions  . Sulfonamide Derivatives     REACTION: cramps    Patient Measurements: Height: 5\' 11"  (180.3 cm) Weight: 384 lb 14.8 oz (174.6 kg) IBW/kg (Calculated) : 75.3    Vital Signs: Temp: 97.6 F (36.4 C) (03/14 0624) BP: 159/106 mmHg (03/14 0624) Pulse Rate: 82  (03/14 0624)  Labs:  Basename 12/30/11 0530 12/29/11 0500 12/28/11 0500  HGB -- -- 7.9*  HCT -- -- 24.6*  PLT -- -- 537*  APTT -- -- --  LABPROT 31.3* 30.6* 25.5*  INR 2.96* 2.88* 2.28*  HEPARINUNFRC -- -- --  CREATININE -- -- --  CKTOTAL -- -- --  CKMB -- -- --  TROPONINI -- -- --   Estimated Creatinine Clearance: 134.6 ml/min (by C-G formula based on Cr of 1.27).   Medications:  Scheduled:    . cefTRIAXone (ROCEPHIN)  IV  2 g Intravenous Q24H  . docusate sodium  100 mg Oral BID  . HYDROmorphone PCA 0.3 mg/mL   Intravenous Q4H  . insulin aspart  0-20 Units Subcutaneous TID WC  . insulin aspart  0-5 Units Subcutaneous QHS  . insulin aspart  12 Units Subcutaneous TID WC  . insulin glargine  22 Units Subcutaneous BID  . linagliptin  5 mg Oral Daily  . polyethylene glycol  17 g Oral Daily  . warfarin  7.5 mg Oral ONCE-1800  . warfarin   Does not apply Once  . Warfarin - Pharmacist Dosing Inpatient   Does not apply q1800    Assessment: 36 yo male on coumadin for VTE prophylaxis and INR at 2.96 with trend up (dose decreased to 7.5mg  on 12/29/11).  Goal of Therapy:  INR 2-3   Plan:  -Continue coumadin 7.5mg  today.   Benny Lennert 12/30/2011,9:51 AM

## 2011-12-30 NOTE — Progress Notes (Signed)
Patient refused cpap for tonight. RT will continue to monitor. 

## 2011-12-31 ENCOUNTER — Other Ambulatory Visit: Payer: Self-pay | Admitting: Orthopedic Surgery

## 2011-12-31 LAB — GLUCOSE, CAPILLARY
Glucose-Capillary: 106 mg/dL — ABNORMAL HIGH (ref 70–99)
Glucose-Capillary: 126 mg/dL — ABNORMAL HIGH (ref 70–99)
Glucose-Capillary: 89 mg/dL (ref 70–99)

## 2011-12-31 MED ORDER — INSULIN GLARGINE 100 UNIT/ML ~~LOC~~ SOLN
18.0000 [IU] | Freq: Every day | SUBCUTANEOUS | Status: DC
  Administered 2011-12-31 – 2012-01-03 (×4): 18 [IU] via SUBCUTANEOUS

## 2011-12-31 MED ORDER — WARFARIN SODIUM 5 MG PO TABS
5.0000 mg | ORAL_TABLET | Freq: Once | ORAL | Status: AC
  Administered 2011-12-31: 5 mg via ORAL
  Filled 2011-12-31: qty 1

## 2011-12-31 MED ORDER — INSULIN ASPART 100 UNIT/ML ~~LOC~~ SOLN
5.0000 [IU] | Freq: Three times a day (TID) | SUBCUTANEOUS | Status: DC
  Administered 2012-01-01 – 2012-01-05 (×10): 5 [IU] via SUBCUTANEOUS

## 2011-12-31 NOTE — Progress Notes (Signed)
Physical Therapy Evaluation Note  No past medical history on file.  Past Surgical History  Procedure Date  . I&d extremity 12/22/2011    Procedure: IRRIGATION AND DEBRIDEMENT EXTREMITY;  Surgeon: Kennieth Rad, MD;  Location: [facility] OR;  Service: Orthopedics;  Laterality: Left;  . I&d extremity 12/24/2011    Procedure: IRRIGATION AND DEBRIDEMENT EXTREMITY;  Surgeon: Kennieth Rad, MD;  Location: [facility] OR;  Service: Orthopedics;  Laterality: Left;  Irrigation and debridement left foot.  . I&d extremity 12/26/2011    Procedure: IRRIGATION AND DEBRIDEMENT EXTREMITY;  Surgeon: Kennieth Rad, MD;  Location: [facility] OR;  Service: Orthopedics;  Laterality: Left;    12/31/11 1053  PT Visit Information  Last PT Received On 12/31/11  Precautions  Precautions Fall  Restrictions  Weight Bearing Restrictions Yes  LLE Weight Bearing NWB  Home Living  Lives With Family (mother, grandmother, and aunt)  Receives Help From Family (pt unable to wiipe self)  Type of Home House  Home Layout One level  Home Access Stairs to enter  Entrance Stairs-Rails Can reach both  Entrance Stairs-Number of Steps 3  Bathroom Shower/Tub Programmer, multimedia Yes  How Accessible (unable to access bathroom)  Home Adaptive Equipment Bedside commode/3-in-1;Walker - rolling;Straight cane;Tub transfer bench  Prior Function  Level of Independence Requires assistive device for independence (pt unable to wipe self, pt sponge bathes)  Able to Take Stairs? Yes  Cognition  Arousal/Alertness Awake/alert  Overall Cognitive Status Appears within functional limits for tasks assessed  Orientation Level Oriented X4  Sensation  Light Touch (pt reports numbness in bilat hand, diabetic neuropathy)  Bed Mobility  Bed Mobility Yes  Supine to Sit 4: Min assist;With rails;HOB flat  Supine to Sit Details (indicate cue type and reason) increased time, labored effort, + SOB  Transfers  Transfers  Yes  Sit to Stand 1: +2 Total assist;From bed;From elevated surface (attempted x 3, multiple techniques, will most likely need lift equipment for safe transfers)  Ambulation/Gait  Ambulation/Gait No  Stairs No  Wheelchair Mobility  Wheelchair Mobility No  Posture/Postural Control  Posture/Postural Control No significant limitations  Balance  Balance Assessed Yes  Static Sitting Balance  Static Sitting - Balance Support No upper extremity supported;Feet supported  Static Sitting - Level of Assistance 6: Modified independent (Device/Increase time)  Static Sitting - Comment/# of Minutes with air matress deflated patient safe to sit EOB to eat meals  RUE Assessment  RUE Assessment WFL  LUE Assessment  LUE Assessment WFL  RLE Assessment  RLE Assessment X  RLE Strength  RLE Overall Strength (grossly 3-/5)  LLE Assessment  LLE Assessment (L LE NWB with diabetic foot ulcer and wound vac)  Cervical Assessment  Cervical Assessment [facility]  Thoracic Assessment  Thoracic Assessment WFL  Lumbar Assessment  Lumbar Assessment WFL  PT - End of Session  Equipment Utilized During Treatment Gait belt  Activity Tolerance Patient limited by fatigue  Patient left (sitting EOB to eat lunch, RN aware)  Nurse Communication Mobility status for transfers  General  Behavior During Session [facility] for tasks performed  Cognition [facility] for tasks performed  PT Assessment  Clinical Impression Statement Patient morbidly obese with L LE NWB due to I&D of L foot with wound vac placement. Patient severely debilitated with + SOB with all activity. Patient unable to stand on this date. Patient to greatly benefit from skilled PT/rehab facility upon d/c to maximize functional recovery and for wound vac  management due to insufficient assist at home and patient severely deconditioned. Patient unsafe to return home at this time.  PT Recommendation/Assessment Patient will need skilled PT in the acute care venue  PT Problem List  Decreased strength;Decreased range of motion;Decreased activity tolerance;Decreased balance;Decreased mobility;Decreased coordination  Barriers to Discharge Decreased caregiver support  PT Therapy Diagnosis  Difficulty walking;Generalized weakness  PT Plan  PT Frequency Min 3X/week  PT Treatment/Interventions DME instruction;Gait training;Functional mobility training;Therapeutic activities;Therapeutic exercise  PT Recommendation  Follow Up Recommendations Skilled nursing facility;Supervision/Assistance - 24 hour  Equipment Recommended Defer to next venue  Individuals Consulted  Consulted and Agree with Results and Recommendations Patient  Acute Rehab PT Goals  PT Goal Formulation With patient  Time For Goal Achievement 2 weeks  Pt will go Supine/Side to Sit with modified independence;with HOB 0 degrees  PT Goal: Supine/Side to Sit - Progress Goal set today  Pt will go Sit to Stand with max assist;from elevated surface (up to RW)  PT Goal: Sit to Stand - Progress Goal set today  Pt will Transfer Bed to Chair/Chair to Bed with max assist  PT Transfer Goal: Bed to Chair/Chair to Bed - Progress Goal set today  Pt will Ambulate 1 - 15 feet;with max assist;with rolling walker  PT Goal: Ambulate - Progress Goal set today  Pt will Perform Home Exercise Program Independently  PT Goal: Perform Home Exercise Program - Progress Goal set today    Pain: 5/10 L foot pain.  Patient very emotional and upset/crying re: "I can't believe how weak I've gotten." Patient aware he is unable to return home at this time. Patient agreeable to SNF placement and is aware of benefits and patient decreased safety at home.  [name], PT, DPT Pager #: [phone] Office #: [phone]

## 2011-12-31 NOTE — Progress Notes (Signed)
Subjective: Doing a little better. Upset about his current state.  Objective: Weight change:   Intake/Output Summary (Last 24 hours) at 12/31/11 1809 Last data filed at 12/31/11 1700  Gross per 24 hour  Intake    840 ml  Output   1151 ml  Net   -311 ml    Filed Vitals:   12/31/11 1400  BP: 143/99  Pulse: 91  Temp: 98.4 F (36.9 C)  Resp: 20   General: Alert, oriented x3, anxious HEENT: Normocephalic, atraumatic, there are airway, mucous members are moist Cardiovascular: Regular rate and rhythm, borderline tachycardia Lungs: Clear to auscultation bilaterally-decreased breath sounds secondary to body habitus Abdomen: Soft, morbidly obese, nontender, hypoactive bowel sounds Extremities: 1+ edema on right lower extremity. On wound VAC Lab Results:   CBG:  Basename 12/31/11 1628 12/31/11 1110 12/31/11 0643 12/30/11 2141 12/30/11 1609 12/30/11 1312  GLUCAP 89 126* 106* 76 69* 101*   Coagulation:  Basename 12/31/11 0500 12/30/11 0530  LABPROT 33.2* 31.3*  INR 3.19* 2.96*    Studies/Results:Medications: Scheduled Meds:    . cefTRIAXone (ROCEPHIN)  IV  2 g Intravenous Q24H  . docusate sodium  100 mg Oral BID  . folic acid  1 mg Oral Daily  . insulin aspart  0-15 Units Subcutaneous TID WC  . insulin aspart  0-5 Units Subcutaneous QHS  . insulin aspart  5 Units Subcutaneous TID WC  . insulin glargine  18 Units Subcutaneous QHS  . linagliptin  5 mg Oral Daily  . mulitivitamin with minerals  1 tablet Oral Daily  . polyethylene glycol  17 g Oral Daily  . thiamine  100 mg Oral Daily   Or  . thiamine  100 mg Intravenous Daily  . warfarin  5 mg Oral ONCE-1800  . warfarin   Does not apply Once  . Warfarin - Pharmacist Dosing Inpatient   Does not apply q1800  . DISCONTD: insulin aspart  0-15 Units Subcutaneous TID WC & HS  . DISCONTD: insulin aspart  0-20 Units Subcutaneous TID WC  . DISCONTD: insulin aspart  0-5 Units Subcutaneous QHS  . DISCONTD: insulin aspart  12  Units Subcutaneous TID WC  . DISCONTD: insulin aspart  12 Units Subcutaneous TID WC  . DISCONTD: insulin glargine  10 Units Subcutaneous BID  . DISCONTD: insulin glargine  22 Units Subcutaneous QHS   Continuous Infusions:    . sodium chloride 20 mL/hr (12/28/11 2226)   PRN Meds:.acetaminophen, acetaminophen, ALPRAZolam, hydrALAZINE, LORazepam, LORazepam, menthol-cetylpyridinium, metoCLOPramide (REGLAN) injection, metoCLOPramide, ondansetron (ZOFRAN) IV, ondansetron, oxyCODONE-acetaminophen, phenol, sodium chloride  Assessment/Plan: Patient Active Hospital Problem List: SIRS (systemic inflammatory response syndrome) (12/23/2011)  stable. On antibiotics pending wound cultures positive for Klebsiella  DIABETES, TYPE 2 (02/23/2010)  put on hypoglycemia. Decreasing Lantus and sliding scale.  OBSTRUCTIVE SLEEP APNEA (08/19/2010)  patient refuses CPAP.  Leukocytosis (12/22/2011)  rechecking tomorrow  Foot abscess (12/22/2011)  managed by orthopedic surgery-primary team. I've antibiotics on Rocephin. Now on wound VAC. Will need short-term skilled nursing  Anemia due to chronic illness (12/22/2011)  stable.  Hyponatremia (12/22/2011)  improved with rehydration-felt to be combination of volume depletion plus pseudohyponatremia  Volume depletion (12/23/2011)  as above.  Morbid obesity, BMI unknown (12/23/2011)  Acute and chronic respiratory failure with hypoxia (12/23/2011)  stable.  Anxiety state (12/30/2011)  on when necessary Xanax   LOS: 10 days   Vinay Ertl K 12/31/2011, 6:09 PM

## 2011-12-31 NOTE — Progress Notes (Signed)
ANTICOAGULATION CONSULT NOTE - Follow Up Consult  Pharmacy Consult for coumadin Indication: VTE prophylaxis  Allergies  Allergen Reactions  . Sulfonamide Derivatives     REACTION: cramps    Patient Measurements: Height: 5\' 11"  (180.3 cm) Weight: 384 lb 14.8 oz (174.6 kg) IBW/kg (Calculated) : 75.3    Vital Signs: Temp: 98.1 F (36.7 C) (03/15 0652) BP: 167/98 mmHg (03/15 0652) Pulse Rate: 90  (03/15 0652)  Labs:  Basename 12/31/11 0500 12/30/11 0530 12/29/11 0500  HGB -- -- --  HCT -- -- --  PLT -- -- --  APTT -- -- --  LABPROT 33.2* 31.3* 30.6*  INR 3.19* 2.96* 2.88*  HEPARINUNFRC -- -- --  CREATININE -- -- --  CKTOTAL -- -- --  CKMB -- -- --  TROPONINI -- -- --   Estimated Creatinine Clearance: 134.6 ml/min (by C-G formula based on Cr of 1.27).   Medications:  Scheduled:     . cefTRIAXone (ROCEPHIN)  IV  2 g Intravenous Q24H  . docusate sodium  100 mg Oral BID  . folic acid  1 mg Oral Daily  . insulin aspart  0-15 Units Subcutaneous TID WC  . insulin aspart  0-5 Units Subcutaneous QHS  . insulin aspart  12 Units Subcutaneous TID WC  . insulin glargine  22 Units Subcutaneous QHS  . linagliptin  5 mg Oral Daily  . mulitivitamin with minerals  1 tablet Oral Daily  . polyethylene glycol  17 g Oral Daily  . thiamine  100 mg Oral Daily   Or  . thiamine  100 mg Intravenous Daily  . warfarin  7.5 mg Oral ONCE-1800  . warfarin   Does not apply Once  . Warfarin - Pharmacist Dosing Inpatient   Does not apply q1800  . DISCONTD: insulin aspart  0-15 Units Subcutaneous TID WC & HS  . DISCONTD: insulin aspart  0-20 Units Subcutaneous TID WC  . DISCONTD: insulin aspart  0-5 Units Subcutaneous QHS  . DISCONTD: insulin aspart  12 Units Subcutaneous TID WC  . DISCONTD: insulin glargine  10 Units Subcutaneous BID  . DISCONTD: insulin glargine  22 Units Subcutaneous BID  . DISCONTD: insulin glargine  22 Units Subcutaneous BID    Assessment: 36 y.o. M on warfarin  for VTE px ordered by Dr. Montez Morita since I&D of infected L foot on 3/11. INR this a.m is slightly SUPRAtherapeutic (INR 3.19 << 2.96, goal of 2-3). No CBC since 3/11. No active s/sx of bleeding noted. Will decrease dose again today.  Goal of Therapy:  INR 2-3   Plan:  1. Warfarin 5 mg x 1 dose at 1800 today 2. Will continue to monitor for any signs/symptoms of bleeding and will follow up with PT/INR in the a.m.   Georgina Pillion, PharmD, BCPS Clinical Pharmacist Pager: [phone] 12/31/2011 3:08 PM

## 2011-12-31 NOTE — Progress Notes (Addendum)
CARE MANAGEMENT NOTE 12/31/2011      Action/Plan:   Discharge planning. Patient will need SNF for shortterm with VAC. Soical Worker aware.   Anticipated DC Date:  01/03/2012   Anticipated DC Plan:  SKILLED NURSING FACILITY  In-house referral  Clinical Social Worker      DC Planning Services  CM consult      [facility] [place] Choice  NA   Choice offered to / List presented to:  NA   DME arranged  NA      DME agency  NA     HH arranged  NA      HH agency  NA   Status of service:  Completed, signed off  Wheelchair to be provided at discharge from SNF.

## 2011-12-31 NOTE — Progress Notes (Signed)
Subjective: TOO WEAK TO GET UP FROM BED.   Objective: Vital signs in last 24 hours: Temp:  [98.1 F (36.7 C)-98.6 F (37 C)] 98.4 F (36.9 C) (03/15 1400) Pulse Rate:  [90-95] 91  (03/15 1400) Resp:  [18-20] 20  (03/15 1400) BP: (143-167)/(84-99) 143/99 mmHg (03/15 1400) SpO2:  [97 %-99 %] 97 % (03/15 1400)  Intake/Output from previous day: 03/14 0701 - 03/15 0700 In: 480 [P.O.:480] Out: 651 [Urine:650; Stool:1] Intake/Output this shift:    No results found for this basename: HGB:5 in the last 72 hours No results found for this basename: WBC:2,RBC:2,HCT:2,PLT:2 in the last 72 hours No results found for this basename: NA:2,K:2,CL:2,CO2:2,BUN:2,CREATININE:2,GLUCOSE:2,CALCIUM:2 in the last 72 hours  Basename 12/31/11 0500 12/30/11 0530  LABPT -- --  INR 3.19* 2.96*    Neurologically intact  Assessment/Plan: Afebrile, pain is under control, swelling is down, cultures show abundant klebsiella,did poorly with PT TODAY,WILL SEE HOW HE DOES TOMORROW.   Kennieth Rad 12/31/2011, 7:09 PM

## 2012-01-01 LAB — BASIC METABOLIC PANEL
BUN: 12 mg/dL (ref 6–23)
Calcium: 8.6 mg/dL (ref 8.4–10.5)
Creatinine, Ser: 1.13 mg/dL (ref 0.50–1.35)
GFR calc Af Amer: >90 mL/min (ref >90)
GFR calc non Af Amer: 84 mL/min — ABNORMAL LOW (ref >90)
Glucose, Bld: 107 mg/dL — ABNORMAL HIGH (ref 70–99)

## 2012-01-01 LAB — CBC
HCT: 23.9 % — ABNORMAL LOW (ref 39.0–52.0)
Hemoglobin: 7.6 g/dL — ABNORMAL LOW (ref 13.0–17.0)
MCH: 27.5 pg (ref 26.0–34.0)
MCHC: 31.8 g/dL (ref 30.0–36.0)
MCV: 86.6 fL (ref 78.0–100.0)
RDW: 13.3 % (ref 11.5–15.5)

## 2012-01-01 MED ORDER — TESTOSTERONE 50 MG/5GM (1%) TD GEL
10.0000 g | Freq: Every day | TRANSDERMAL | Status: DC
  Administered 2012-01-01 – 2012-01-04 (×4): 5 g via TRANSDERMAL
  Administered 2012-01-05: 10 g via TRANSDERMAL
  Filled 2012-01-01: qty 10
  Filled 2012-01-01 (×2): qty 5
  Filled 2012-01-01: qty 10
  Filled 2012-01-01 (×2): qty 5

## 2012-01-01 NOTE — Progress Notes (Signed)
Subjective: Want to go home  Objective: Vital signs in last 24 hours: Temp:  [98.2 F (36.8 C)-98.7 F (37.1 C)] 98.7 F (37.1 C) (03/16 1400) Pulse Rate:  [81-100] 100  (03/16 1400) Resp:  [18-20] 18  (03/16 1400) BP: (140-181)/(86-111) 176/101 mmHg (03/16 1626) SpO2:  [92 %-96 %] 96 % (03/16 1400)  Intake/Output from previous day: 03/15 0701 - 03/16 0700 In: 1840 [P.O.:1600; I.V.:240] Out: 1751 [Urine:1750; Stool:1] Intake/Output this shift: Total I/O In: 960 [P.O.:960] Out: 2150 [Urine:2150]   Basename 01/01/12 0600  HGB 7.6*    Basename 01/01/12 0600  WBC 12.1*  RBC 2.76*  HCT 23.9*  PLT 536*    Basename 01/01/12 0600  NA 136  K 3.9  CL 100  CO2 28  BUN 12  CREATININE 1.13  GLUCOSE 107*  CALCIUM 8.6    Basename 01/01/12 0600 12/31/11 0500  LABPT -- --  INR 3.37* 3.19*    Neurovascular intact  Assessment/Plan: AWAIT NHP PLACEMENT    Hester Joslin F 01/01/2012, 6:17 PM

## 2012-01-01 NOTE — Progress Notes (Signed)
Patient is refusing CPAP therapy for tonight.

## 2012-01-01 NOTE — Progress Notes (Signed)
ANTICOAGULATION CONSULT NOTE - Follow Up Consult  Pharmacy Consult for coumadin Indication: VTE prophylaxis  Allergies  Allergen Reactions  . Sulfonamide Derivatives     REACTION: cramps    Patient Measurements: Height: 5\' 11"  (180.3 cm) Weight: 384 lb 14.8 oz (174.6 kg) IBW/kg (Calculated) : 75.3    Vital Signs: Temp: 98.2 F (36.8 C) (03/16 0713) BP: 140/90 mmHg (03/16 0713) Pulse Rate: 88  (03/16 0713)  Labs:  Basename 01/01/12 0600 12/31/11 0500 12/30/11 0530  HGB 7.6* -- --  HCT 23.9* -- --  PLT 536* -- --  APTT -- -- --  LABPROT 34.6* 33.2* 31.3*  INR 3.37* 3.19* 2.96*  HEPARINUNFRC -- -- --  CREATININE 1.13 -- --  CKTOTAL -- -- --  CKMB -- -- --  TROPONINI -- -- --   Estimated Creatinine Clearance: 151.2 ml/min (by C-G formula based on Cr of 1.13).   Medications:  Scheduled:     . cefTRIAXone (ROCEPHIN)  IV  2 g Intravenous Q24H  . docusate sodium  100 mg Oral BID  . folic acid  1 mg Oral Daily  . insulin aspart  0-15 Units Subcutaneous TID WC  . insulin aspart  0-5 Units Subcutaneous QHS  . insulin aspart  5 Units Subcutaneous TID WC  . insulin glargine  18 Units Subcutaneous QHS  . linagliptin  5 mg Oral Daily  . mulitivitamin with minerals  1 tablet Oral Daily  . polyethylene glycol  17 g Oral Daily  . thiamine  100 mg Oral Daily   Or  . thiamine  100 mg Intravenous Daily  . warfarin  5 mg Oral ONCE-1800  . warfarin   Does not apply Once  . Warfarin - Pharmacist Dosing Inpatient   Does not apply q1800  . DISCONTD: insulin aspart  12 Units Subcutaneous TID WC  . DISCONTD: insulin glargine  22 Units Subcutaneous QHS    Assessment: 36 y.o. M on warfarin for VTE px ordered by Dr. Montez Morita since I&D of infected L foot on 3/11. INR this a.m is  SUPRAtherapeutic (INR 3.37 << 3.19, goal of 2-3). Hg=7.6 (last was 7.9 on 3/12). No active s/sx of bleeding noted.  Goal of Therapy:  INR 2-3   Plan:  1. Hold coumadin today 2. Will continue to monitor  for any signs/symptoms of bleeding and will follow up with PT/INR in the a.m.   Harland German, Pharm D 01/01/2012 12:35 PM

## 2012-01-01 NOTE — Progress Notes (Signed)
Patient ID: Adrian Khan, male   DOB: 1976/01/13, 36 y.o.   MRN: [id number] Pt.  Testosterone  Level is 34.1  Very low.  Range  Is 240 to  900.  Will start him  On androgel  While he is in the hospital. See orders.

## 2012-01-02 ENCOUNTER — Encounter (HOSPITAL_COMMUNITY): Payer: Self-pay

## 2012-01-02 LAB — PROTIME-INR: INR: 2.9 — ABNORMAL HIGH (ref 0.00–1.49)

## 2012-01-02 LAB — GLUCOSE, CAPILLARY
Glucose-Capillary: 101 mg/dL — ABNORMAL HIGH (ref 70–99)
Glucose-Capillary: 98 mg/dL (ref 70–99)
Glucose-Capillary: 127 mg/dL — ABNORMAL HIGH (ref 70–99)

## 2012-01-02 MED ORDER — WARFARIN SODIUM 5 MG PO TABS
5.0000 mg | ORAL_TABLET | Freq: Once | ORAL | Status: AC
  Administered 2012-01-02: 5 mg via ORAL
  Filled 2012-01-02: qty 1

## 2012-01-02 NOTE — Progress Notes (Signed)
Patient doing well. Tolerating decrease of Lantus. CBGs staying stable in the 120s. Continuing when necessary Xanax for anxiety. Skilled nursing facility, hopefully soon.

## 2012-01-02 NOTE — Progress Notes (Signed)
Subjective: NO C/O PAIN, STILL COUGHING UP MUCUS.   Objective: Vital signs in last 24 hours: Temp:  [98.6 F (37 C)-99.7 F (37.6 C)] 99 F (37.2 C) (03/17 1353) Pulse Rate:  [60-108] 92  (03/17 1353) Resp:  [18-22] 18  (03/17 1353) BP: (147-182)/(63-104) 169/66 mmHg (03/17 1353) SpO2:  [97 %-100 %] 98 % (03/17 1353)  Intake/Output from previous day: 03/16 0701 - 03/17 0700 In: 960 [P.O.:960] Out: 2150 [Urine:2150] Intake/Output this shift: Total I/O In: 240 [P.O.:240] Out: 450 [Urine:450]   Basename 01/01/12 0600  HGB 7.6*    Basename 01/01/12 0600  WBC 12.1*  RBC 2.76*  HCT 23.9*  PLT 536*    Basename 01/01/12 0600  NA 136  K 3.9  CL 100  CO2 28  BUN 12  CREATININE 1.13  GLUCOSE 107*  CALCIUM 8.6    Basename 01/02/12 0515 01/01/12 0600  LABPT -- --  INR 2.90* 3.37*    Neurologically intact  Assessment/Plan: NO C/O PAIN, STATES HE  TRIED TO SIT-UP TO ,AND DID SOME EXERCISE FOR HIS ARMS Plan :DISCHARGE ONCE PATIENT IS AMBULATORY NON-WT BEARING ON LEFT LEG.   Kennieth Rad 01/02/2012, 4:37 PM

## 2012-01-02 NOTE — Progress Notes (Signed)
ANTICOAGULATION CONSULT NOTE - Follow Up Consult  Pharmacy Consult for coumadin Indication: VTE prophylaxis  Allergies  Allergen Reactions  . Sulfonamide Derivatives     REACTION: cramps    Patient Measurements: Height: 5\' 11"  (180.3 cm) Weight: 384 lb 14.8 oz (174.6 kg) IBW/kg (Calculated) : 75.3    Vital Signs: Temp: 99.7 F (37.6 C) (03/17 0706) BP: 182/84 mmHg (03/17 0951) Pulse Rate: 92  (03/17 0951)  Labs:  Basename 01/02/12 0515 01/01/12 0600 12/31/11 0500  HGB -- 7.6* --  HCT -- 23.9* --  PLT -- 536* --  APTT -- -- --  LABPROT 30.8* 34.6* 33.2*  INR 2.90* 3.37* 3.19*  HEPARINUNFRC -- -- --  CREATININE -- 1.13 --  CKTOTAL -- -- --  CKMB -- -- --  TROPONINI -- -- --   Estimated Creatinine Clearance: 151.2 ml/min (by C-G formula based on Cr of 1.13).   Medications:  Scheduled:     . cefTRIAXone (ROCEPHIN)  IV  2 g Intravenous Q24H  . docusate sodium  100 mg Oral BID  . folic acid  1 mg Oral Daily  . insulin aspart  0-15 Units Subcutaneous TID WC  . insulin aspart  0-5 Units Subcutaneous QHS  . insulin aspart  5 Units Subcutaneous TID WC  . insulin glargine  18 Units Subcutaneous QHS  . linagliptin  5 mg Oral Daily  . mulitivitamin with minerals  1 tablet Oral Daily  . polyethylene glycol  17 g Oral Daily  . testosterone  10 g Transdermal Daily  . thiamine  100 mg Oral Daily   Or  . thiamine  100 mg Intravenous Daily  . warfarin   Does not apply Once  . Warfarin - Pharmacist Dosing Inpatient   Does not apply q1800    Assessment: 36 y.o. M on warfarin for VTE px ordered by Dr. Montez Morita since I&D of infected L foot on 3/11. INR this a.m is  SUPRAtherapeutic (INR 2.9 << 3.37, goal of 2-3). No active s/sx of bleeding noted.   Goal of Therapy:  INR 2-3   Plan:  1. Will continue coumadin 5mg  today 2. Will continue to monitor for any signs/symptoms of bleeding and will follow up with PT/INR in the a.m.   Harland German, Pharm D 01/02/2012 12:07  PM

## 2012-01-03 ENCOUNTER — Encounter (HOSPITAL_COMMUNITY): Payer: Self-pay | Admitting: Orthopedic Surgery

## 2012-01-03 LAB — PROTIME-INR: INR: 2.45 — ABNORMAL HIGH (ref 0.00–1.49)

## 2012-01-03 LAB — GLUCOSE, CAPILLARY
Glucose-Capillary: 127 mg/dL — ABNORMAL HIGH (ref 70–99)
Glucose-Capillary: 154 mg/dL — ABNORMAL HIGH (ref 70–99)
Glucose-Capillary: 138 mg/dL — ABNORMAL HIGH (ref 70–99)
Glucose-Capillary: 157 mg/dL — ABNORMAL HIGH (ref 70–99)

## 2012-01-03 MED ORDER — WARFARIN SODIUM 6 MG PO TABS
6.0000 mg | ORAL_TABLET | Freq: Once | ORAL | Status: AC
  Administered 2012-01-03: 6 mg via ORAL
  Filled 2012-01-03: qty 1

## 2012-01-03 MED ORDER — PRO-STAT SUGAR FREE PO LIQD
30.0000 mL | Freq: Three times a day (TID) | ORAL | Status: DC
  Administered 2012-01-03 – 2012-01-04 (×4): 30 mL via ORAL
  Filled 2012-01-03 (×8): qty 30

## 2012-01-03 NOTE — Progress Notes (Signed)
Pt refuses to wear CPAP. Pt has attempted to wear CPAP previously but was uncomfortable. Pt encouraged to call RT if pt changes mind. No distress noted.

## 2012-01-03 NOTE — Progress Notes (Signed)
Clinical Social Work-CSW met again with pt to discuss disposition, pr politely adamant he will return home with home health,mother and assistance of aunts. He relays he is more than comfortable going home-as is his family-and will recover more quickly at home-CSW relayed to CM and PN and is now signing off-no further needs-Katilynn Sinkler-MSW, [phone]

## 2012-01-03 NOTE — Progress Notes (Signed)
ANTICOAGULATION CONSULT NOTE - Follow Up Consult  Pharmacy Consult for coumadin Indication: VTE prophylaxis  Allergies  Allergen Reactions  . Sulfonamide Derivatives     REACTION: cramps    Patient Measurements: Height: 5\' 11"  (180.3 cm) Weight: 384 lb 14.8 oz (174.6 kg) IBW/kg (Calculated) : 75.3    Vital Signs: Temp: 98.4 F (36.9 C) (03/18 0627) BP: 157/88 mmHg (03/18 0627) Pulse Rate: 95  (03/18 0627)  Labs:  Alvira Philips 01/03/12 0655 01/02/12 0515 01/01/12 0600  HGB -- -- 7.6*  HCT -- -- 23.9*  PLT -- -- 536*  APTT -- -- --  LABPROT 27.0* 30.8* 34.6*  INR 2.45* 2.90* 3.37*  HEPARINUNFRC -- -- --  CREATININE -- -- 1.13  CKTOTAL -- -- --  CKMB -- -- --  TROPONINI -- -- --   Estimated Creatinine Clearance: 151.2 ml/min (by C-G formula based on Cr of 1.13).   Medications:  Scheduled:     . cefTRIAXone (ROCEPHIN)  IV  2 g Intravenous Q24H  . docusate sodium  100 mg Oral BID  . folic acid  1 mg Oral Daily  . insulin aspart  0-15 Units Subcutaneous TID WC  . insulin aspart  0-5 Units Subcutaneous QHS  . insulin aspart  5 Units Subcutaneous TID WC  . insulin glargine  18 Units Subcutaneous QHS  . linagliptin  5 mg Oral Daily  . mulitivitamin with minerals  1 tablet Oral Daily  . polyethylene glycol  17 g Oral Daily  . testosterone  10 g Transdermal Daily  . thiamine  100 mg Oral Daily   Or  . thiamine  100 mg Intravenous Daily  . warfarin  5 mg Oral ONCE-1800  . warfarin   Does not apply Once  . Warfarin - Pharmacist Dosing Inpatient   Does not apply q1800    Assessment: 36 y.o. M on warfarin for VTE px ordered by Dr. Montez Morita since I&D of infected L foot on 3/11. INR this a.m is therapeutic and decreased after holding 3/16. No active s/sx of bleeding noted.   Goal of Therapy:  INR 2-3   Plan:  1. Coumadin 6mg  today 2. Will continue to monitor for any signs/symptoms of bleeding and will follow up with PT/INR in the a.m.   Talbert Cage, PharmD 01/03/2012  10:36 AM

## 2012-01-03 NOTE — Progress Notes (Addendum)
Nutrition Follow-up  Pt s/p debridement of left foot, application of wound VAC 3/13. Wound care note reviewed 3/18. PO intake at 75-100% per flowsheet records.   Pt's Mom called during RD visitation with pt and requested to speak with RD -- reviewed DM diet guidelines and recommendations via telephone; pt and Mom's questions answered.   Diet Order:  Carbohydrate Modified Medium Calorie  Meds: Scheduled Meds:   . cefTRIAXone (ROCEPHIN)  IV  2 g Intravenous Q24H  . docusate sodium  100 mg Oral BID  . folic acid  1 mg Oral Daily  . insulin aspart  0-15 Units Subcutaneous TID WC  . insulin aspart  0-5 Units Subcutaneous QHS  . insulin aspart  5 Units Subcutaneous TID WC  . insulin glargine  18 Units Subcutaneous QHS  . linagliptin  5 mg Oral Daily  . mulitivitamin with minerals  1 tablet Oral Daily  . polyethylene glycol  17 g Oral Daily  . testosterone  10 g Transdermal Daily  . thiamine  100 mg Oral Daily  . warfarin  5 mg Oral ONCE-1800  . warfarin  6 mg Oral ONCE-1800  . warfarin   Does not apply Once  . Warfarin - Pharmacist Dosing Inpatient   Does not apply q1800  . DISCONTD: thiamine  100 mg Intravenous Daily   Continuous Infusions:   . sodium chloride 20 mL/hr (01/01/12 0849)   PRN Meds:.acetaminophen, acetaminophen, ALPRAZolam, hydrALAZINE, LORazepam, LORazepam, menthol-cetylpyridinium, metoCLOPramide (REGLAN) injection, metoCLOPramide, ondansetron (ZOFRAN) IV, ondansetron, oxyCODONE-acetaminophen, phenol, sodium chloride  Labs:  CMP     Component Value Date/Time   NA 136 01/01/2012 0600   K 3.9 01/01/2012 0600   CL 100 01/01/2012 0600   CO2 28 01/01/2012 0600   GLUCOSE 107* 01/01/2012 0600   BUN 12 01/01/2012 0600   CREATININE 1.13 01/01/2012 0600   CALCIUM 8.6 01/01/2012 0600   PROT 8.4* 12/21/2011 2342   ALBUMIN 1.5* 12/21/2011 2342   AST 15 12/21/2011 2342   ALT 12 12/21/2011 2342   ALKPHOS 146* 12/21/2011 2342   BILITOT 0.2* 12/21/2011 2342   GFRNONAA 84* 01/01/2012 0600   GFRAA >90 01/01/2012 0600     Intake/Output Summary (Last 24 hours) at 01/03/12 1501 Last data filed at 01/03/12 1345  Gross per 24 hour  Intake    708 ml  Output   2200 ml  Net  -1492 ml    CBG (last 3)   Basename 01/03/12 1127 01/03/12 0642 01/02/12 2140  GLUCAP 154* 127* 121*    Weight Status:  174.6 kg (3/9) -- no new weight available  Re-estimated needs:  2000-2200 kcals, 110-120 gm protein  Nutrition Dx:  Increased Nutrient Needs, ongoing  Goal:  Meet >90% of estimated nutrition needs, unmet Monitor: PO intake, labs, weight, I/O's  Intervention:    Add Prostat liquid protein 30 ml PO TID (72 kcals, 15 gm protein per dose)  RD to follow for nutrition care plan  Alger Memos Pager #:  [phone]

## 2012-01-03 NOTE — Progress Notes (Signed)
Physical Therapy Treatment Note   01/03/12 1108  PT Visit Information  Last PT Received On 01/03/12  Precautions  Precautions Fall  Restrictions  LLE Weight Bearing NWB  Bed Mobility  Supine to Sit 5: Supervision (with assist to manage L LE wound vac)  Supine to Sit Details (indicate cue type and reason) pt able to not use bed rail to transition supine --> sit  Transfers  Sit to Stand 1: +2 Total assist;From bed (pt= 35%)  Sit to Stand Details (indicate cue type and reason) patient trialed x3, once RW lowered patient able to achieve standing on last trial with maxAx2 however unable to maintain L LE NWB, pt reports only putting weight thru L heel however pt instructed foot to be completely NWB. process very laboring. patient tolerated standing x 20 sec but unable to achieve full erect posture  Lateral/Scoot Transfers 4: Min assist;With armrests removed (assist to hold chair in place)  Lateral/Scoot Transfer Details (indicate cue type and reason) increased time required but patient able to scoot from bed to drop arm recliner  Ambulation/Gait  Ambulation/Gait No  Stairs No  Wheelchair Mobility  Wheelchair Mobility No  Static Sitting Balance  Static Sitting - Balance Support No upper extremity supported  Static Sitting - Level of Assistance 6: Modified independent (Device/Increase time)  Static Sitting - Comment/# of Minutes pt safe to sit EOB with air deflated from mattress  PT - End of Session  Equipment Utilized During Treatment Gait belt  Activity Tolerance Patient limited by fatigue  Patient left in chair;with call bell in reach (with LEs elevated)  Nurse Communication Mobility status for transfers (lateral scoot back to bed)  General  Behavior During Session [name] for tasks performed  [facility] for tasks performed  PT - Assessment/Plan  Comments on Treatment Session Patient remains to strongly benefit from SNF to optimize therapy frequencing to maximize functional recovery  and safety. Patient does not have adequate support at home however patient reports he has 4 cousins he can call over to "lift" him up when he needs it. Patient is unable to ambulate at this time and requires assist for all ADLs. Patient is refusing to go to a rehab facility despite maximal education on benefits and importance of 24/7 supervision for both mobility and wound vac care. Patient has 3 STE to enter home and patient reports his 4 cousins can help him get up. Spoke with case management re: patients decision to return home against other recommendations. Patient to require assist from EMT to enter home due to bariatric w/c unable to fit on steps. Patient will need home health RN and PT, bariatric walker, and w/c with elevated leg rest due to patient weight at 384 pounds. Patient reports having a 3-n-1 commode. This DME will assist in improving patient safety at home however d/c home remains NOT RECOMMENDED by PT.  PT Plan Discharge plan remains appropriate  PT Frequency Min 3X/week  Follow Up Recommendations Skilled nursing facility;Supervision/Assistance - 24 hour;Supervision for mobility/OOB  Equipment Recommended Defer to next venue  Acute Rehab PT Goals  PT Goal: Supine/Side to Sit - Progress Progressing toward goal  PT Goal: Sit to Stand - Progress Progressing toward goal  PT Transfer Goal: Bed to Chair/Chair to Bed - Progress Progressing toward goal    Exercises: patient provided with green theraband to complete UE there ex to increase UE strength to improve ease with transfers. Handout provided for all there ex.  Pain: pt denies pain at this  time  Adrian Khan, PT, DPT Pager #: [phone] Office #: [phone]

## 2012-01-03 NOTE — Plan of Care (Signed)
Problem: Food- and Nutrition-Related Knowledge Deficit (NB-1.1) Goal: Nutrition education Formal process to instruct or train a patient/client in a skill or to impart knowledge to help patients/clients voluntarily manage or modify food choices and eating behavior to maintain or improve health.  Outcome: Completed/Met Date Met:  01/03/12 Reviewed DM diet guidelines and recommendations with pt's Mom via telephone. Carbohydrate Counting for People with Diabetes handouts provided 3/12 upon initial RD visitation. Pt and Mom's questions answered. RD available should pt or pt's Mom have further questions.  Alger Memos, RD Pager #: [phone]

## 2012-01-03 NOTE — Consult Note (Signed)
WOC consult Note Reason for Consult: Requested to assist with left foot wound vac dressing change.  Several full thickness wounds from surgical debridement last week; followed by Dr Montez Morita of ortho service for assessment and plan of care. Bedside nurse performed vac dressing change Friday and wound is very difficult to maintain seal R/T macerated skin and location near toes. Wound type: Full thickness to left anterior foot 3.5X6X1cm, moist yellow wound bed with visible tendons. Plantar foot 2X1X1cm with undermining to 3 cm from 3oclock to 6 oclock.  Beefy red wound bed. Sutures to anterior foot intact and well approximated. Upper foot plantar surface 1X1X1cm.  Does not communicate with other plantar surface wound. Partial thickness wound to upper plantar surface 3X1X.1cm, moist red. Drainage (amount, consistency, odor) No odor.   Cannister with large amt pink drainage. Periwound: Skin white and very macerated surrounding plantar foot wounds R/T constant moisture prior to vac application.  Large sheets peeling in some sections. Dressing procedure/placement/frequency: Applied one piece black sponge to anterior foot, and one each to 3 wounds on plantar foot.  All bridged together to 125cm cont suction.  Pt denies c/o pain with procedure.  Applied barrier ring around toes to assist with maintaining seal of drape dressing.  2X2 gauze applied over suture line.  Ace wrap applied over vac dressings to protect seal of drape when pt ambulating. Pr states he plans to D/C home with vac.  Plan dressing change Wed if still inpatient at that time.  Will not plan to follow further unless re-consulted.  [address] RN, MSN, [employer]  [phone]

## 2012-01-03 NOTE — Progress Notes (Signed)
Subjective: I want to go home.  Objective: Vital signs in last 24 hours: Temp:  [98.4 F (36.9 C)-99.2 F (37.3 C)] 99.2 F (37.3 C) (03/18 1345) Pulse Rate:  [95-100] 98  (03/18 1345) Resp:  [20] 20  (03/18 1345) BP: (149-176)/(88-98) 149/92 mmHg (03/18 1345) SpO2:  [93 %-98 %] 93 % (03/18 1345)  Intake/Output from previous day: 03/17 0701 - 03/18 0700 In: 1188 [P.O.:480; I.V.:708] Out: 1850 [Urine:1850] Intake/Output this shift:     Basename 01/01/12 0600  HGB 7.6*    Basename 01/01/12 0600  WBC 12.1*  RBC 2.76*  HCT 23.9*  PLT 536*    Basename 01/01/12 0600  NA 136  K 3.9  CL 100  CO2 28  BUN 12  CREATININE 1.13  GLUCOSE 107*  CALCIUM 8.6    Basename 01/03/12 0655 01/02/12 0515  LABPT -- --  INR 2.45* 2.90*    Neurologically intact  Assessment/Plan: Patient is unable to maneuver himself without a lot of assistance.   Kennieth Rad 01/03/2012, 7:22 PM

## 2012-01-04 LAB — GLUCOSE, CAPILLARY
Glucose-Capillary: 143 mg/dL — ABNORMAL HIGH (ref 70–99)
Glucose-Capillary: 143 mg/dL — ABNORMAL HIGH (ref 70–99)
Glucose-Capillary: 119 mg/dL — ABNORMAL HIGH (ref 70–99)
Glucose-Capillary: 118 mg/dL — ABNORMAL HIGH (ref 70–99)

## 2012-01-04 LAB — CBC
MCH: 26.8 pg (ref 26.0–34.0)
MCHC: 31.5 g/dL (ref 30.0–36.0)
RDW: 13.4 % (ref 11.5–15.5)

## 2012-01-04 LAB — PROTIME-INR
INR: 2.26 — ABNORMAL HIGH (ref 0.00–1.49)
Prothrombin Time: 25.3 seconds — ABNORMAL HIGH (ref 11.6–15.2)

## 2012-01-04 MED ORDER — METFORMIN HCL 1000 MG PO TABS: 1000.0000 mg | ORAL_TABLET | Freq: Two times a day (BID) | ORAL | Status: DC

## 2012-01-04 MED ORDER — INSULIN GLARGINE 100 UNIT/ML ~~LOC~~ SOLN
20.0000 [IU] | Freq: Every day | SUBCUTANEOUS | Status: DC
  Administered 2012-01-04: 20 [IU] via SUBCUTANEOUS

## 2012-01-04 MED ORDER — TESTOSTERONE 50 MG/5GM (1%) TD GEL: 10.0000 g | Freq: Every day | TRANSDERMAL | Status: DC

## 2012-01-04 MED ORDER — METOPROLOL SUCCINATE ER 25 MG PO TB24: 25.0000 mg | ORAL_TABLET | Freq: Every day | ORAL | Status: DC

## 2012-01-04 MED ORDER — WARFARIN SODIUM 7.5 MG PO TABS
7.5000 mg | ORAL_TABLET | Freq: Once | ORAL | Status: AC
  Administered 2012-01-04: 7.5 mg via ORAL
  Filled 2012-01-04: qty 1

## 2012-01-04 NOTE — Progress Notes (Signed)
ANTICOAGULATION CONSULT NOTE - Follow Up Consult  Pharmacy Consult for coumadin Indication: VTE prophylaxis  Allergies  Allergen Reactions  . Sulfonamide Derivatives     REACTION: cramps    Patient Measurements: Height: 5\' 11"  (180.3 cm) Weight: 384 lb 14.8 oz (174.6 kg) IBW/kg (Calculated) : 75.3    Vital Signs: Temp: 98.1 F (36.7 C) (03/19 0603) BP: 163/87 mmHg (03/19 0603) Pulse Rate: 91  (03/19 0603)  Labs:  Basename 01/04/12 0530 01/03/12 0655 01/02/12 0515  HGB 8.0* -- --  HCT 25.4* -- --  PLT 570* -- --  APTT -- -- --  LABPROT 25.3* 27.0* 30.8*  INR 2.26* 2.45* 2.90*  HEPARINUNFRC -- -- --  CREATININE -- -- --  CKTOTAL -- -- --  CKMB -- -- --  TROPONINI -- -- --   Estimated Creatinine Clearance: 151.2 ml/min (by C-G formula based on Cr of 1.13).   Medications:  Scheduled:     . cefTRIAXone (ROCEPHIN)  IV  2 g Intravenous Q24H  . docusate sodium  100 mg Oral BID  . feeding supplement  30 mL Oral TID BM  . folic acid  1 mg Oral Daily  . insulin aspart  0-15 Units Subcutaneous TID WC  . insulin aspart  0-5 Units Subcutaneous QHS  . insulin aspart  5 Units Subcutaneous TID WC  . insulin glargine  20 Units Subcutaneous QHS  . linagliptin  5 mg Oral Daily  . mulitivitamin with minerals  1 tablet Oral Daily  . polyethylene glycol  17 g Oral Daily  . testosterone  10 g Transdermal Daily  . thiamine  100 mg Oral Daily  . warfarin  6 mg Oral ONCE-1800  . warfarin   Does not apply Once  . Warfarin - Pharmacist Dosing Inpatient   Does not apply q1800  . DISCONTD: insulin glargine  18 Units Subcutaneous QHS  . DISCONTD: thiamine  100 mg Intravenous Daily    Assessment: 36 y.o. M on warfarin for VTE px since I&D of infected L foot on 12/22/11. INR = 2.26 today, therapeutic INR and decreased after holding dose 3/16 due to INR 3.37 on 3/16. No active s/sx of bleeding noted.  INR therapeutic but trending down in low end of goal 2-3.    Goal of Therapy:  INR  2-3   Plan:  1. Coumadin 7.5mg  today 2. Will continue to monitor for any signs/symptoms of bleeding and will follow up with PT/INR in the a.m.   Arman Filter , RPh 01/04/2012 12:00 PM

## 2012-01-04 NOTE — Progress Notes (Signed)
Against advice from physicians, patient is wishing to go home and arrangements are being made.  Diabetes mellitus2 uncontrolled: Patient's A1c is 10.2 which equals an average sugar of 318. He is only on Januvia at home. We had an extensive discussion today and he has no desire to start insulin yet. At this time, the best we can do for him is to put him on metformin 1000 by mouth twice a day in addition to his Januvia and his PCP will follow his A1c. I suspect that likely he will need to be started on subcutaneous insulin in the next year.  Hypertension: Patient's blood pressures have been steadily increasing. He's not on any home medications. I have started him on Toprol-XL 25 mg by mouth daily.  Low testosterone: Patient is on AndroGel  I have updated his discharge medication reconciliation form with the above medication changes. The new medicines have already been electronically prescribed to his pharmacy. Final discharge orders will be as per the patient's orthopedic surgeon.

## 2012-01-04 NOTE — Progress Notes (Signed)
Utilization review completed. Kellyann Ordway, RN, BSN. 01/04/12  

## 2012-01-04 NOTE — Progress Notes (Signed)
Orthopedic Tech Progress Note Patient Details:  Adrian Khan 06-08-1976 [id number]  Other Ortho Devices Ortho Device Location: draco shoe Ortho Device Interventions: Application   Shawnie Pons 01/04/2012, 12:25 PM

## 2012-01-04 NOTE — Progress Notes (Signed)
Physical Therapy Treatment Note   01/04/12 1332  PT Visit Information  Last PT Received On 01/04/12  Precautions  Precautions Fall  Restrictions  LLE Weight Bearing NWB  Bed Mobility  Bed Mobility (pt received sitting EOB with Dr. Montez Morita)  Transfers  Sit to Stand 1: +2 Total assist;From bed (pt= 60%, pt now L LE WBing thru heel)  Sit to Stand Details (indicate cue type and reason) patient able to stand on first attempt now that patient is able to WB thru L heel. However, patient unable to only weight-bear thru heel. Dr. Montez Morita aware. Patient with + drainage from wound vac. Dr. Montez Morita available. Patient able to tolerate standing with RW with minAx1 x 2 min.  Stand Pivot Transfers 4: Min assist  Stand Pivot Transfer Details (indicate cue type and reason) pt used RW, verbal cues for sequecing. Patient FWB on L LE despite verbal cues. Dr. Montez Morita aware of patient's inability to only WB on L heel.  Exercises  Exercises (pt returned demonstrated UE ther ex with theraband)  PT - End of Session  Equipment Utilized During Treatment Gait belt (L darco shoe)  Activity Tolerance Patient tolerated treatment well  Patient left in chair;with call bell in reach (bilat LEs elevated)  Nurse Communication Mobility status for transfers  General  Behavior During Session [facility] for tasks performed  Cognition [facility] for tasks performed  PT - Assessment/Plan  Comments on Treatment Session Patient with improved ability to stand this date due to increased WBing status on L LE, however patient with positive drainage from L foot wound vac site in which Dr. Montez Morita observed. Patient remains unsafe to return home due to L LE wound vac and wound and inability to maintain WBing through L heel. Patient remains unable to asc/dsec 3 steps to enter home. Patient con't to be increased fall risk.  PT Plan Discharge plan remains appropriate  PT Frequency Min 3X/week  Follow Up Recommendations Skilled nursing  facility;Supervision/Assistance - 24 hour  Equipment Recommended Defer to next venue  Acute Rehab PT Goals  PT Goal: Supine/Side to Sit - Progress Progressing toward goal  PT Goal: Sit to Stand - Progress Progressing toward goal  PT Transfer Goal: Bed to Chair/Chair to Bed - Progress Progressing toward goal  PT Goal: Ambulate - Progress Progressing toward goal  PT Goal: Perform Home Exercise Program - Progress Progressing toward goal    [name], PT, DPT Pager #: [phone] Office #: [phone]

## 2012-01-04 NOTE — Progress Notes (Signed)
Pt does not wish to wear CPAP at this time. Pt states it is uncomfortable. Pt in no distress. Pt encouraged to call RT if pt changes mind.

## 2012-01-04 NOTE — Progress Notes (Signed)
Subjective: 7 Days Post-Op Procedure(s) (LRB): IRRIGATION AND DEBRIDEMENT EXTREMITY (Left) Patient reports pain as 2 on 0-10 scale.    Objective: Vital signs in last 24 hours: Temp:  [97.7 F (36.5 C)-98.4 F (36.9 C)] 97.7 F (36.5 C) (03/19 1306) Pulse Rate:  [91-101] 96  (03/19 1306) Resp:  [18-20] 20  (03/19 1306) BP: (163-183)/(87-108) 180/108 mmHg (03/19 1306) SpO2:  [94 %-99 %] 98 % (03/19 1306)  Intake/Output from previous day: 03/18 0701 - 03/19 0700 In: 240 [I.V.:240] Out: 3250 [Urine:3200; Drains:50] Intake/Output this shift: Total I/O In: 360 [P.O.:360] Out: 800 [Urine:800]   Basename 01/04/12 0530  HGB 8.0*    Basename 01/04/12 0530  WBC 12.6*  RBC 2.98*  HCT 25.4*  PLT 570*   No results found for this basename: NA:2,K:2,CL:2,CO2:2,BUN:2,CREATININE:2,GLUCOSE:2,CALCIUM:2 in the last 72 hours  Basename 01/04/12 0530 01/03/12 0655  LABPT -- --  INR 2.26* 2.45*    Neurologically intact  Assessment/Plan: 7 Days Post-Op Procedure(s) (LRB): IRRIGATION AND DEBRIDEMENT EXTREMITY (Left) Discharge home with home health  Kennieth Rad 01/04/2012, 2:09 PM Patient refuses to go to NHP yet is unable  to handle himself with getting out of bed  (needing 3 to assist him to get out of bed) will use darkot shoe and allow some wt bearing to left foot,with use of walker. Will need to continue with wound vac treatment at home ,plan to use coumadin for 2 weeks. There is still significant blood stain drainage from the area, H&H is 88&25 but patient is stable will use feosol 325 TID,also will need ambulance for transfer due to 3 stairs at  Home.

## 2012-01-05 LAB — PROTIME-INR: INR: 2.26 — ABNORMAL HIGH (ref 0.00–1.49)

## 2012-01-05 MED ORDER — WARFARIN SODIUM 7.5 MG PO TABS
7.5000 mg | ORAL_TABLET | Freq: Once | ORAL | Status: DC
  Filled 2012-01-05: qty 1

## 2012-01-05 MED ORDER — OXYCODONE-ACETAMINOPHEN 5-325 MG PO TABS: 2.0000 | ORAL_TABLET | ORAL | Status: AC | PRN

## 2012-01-05 MED ORDER — WARFARIN SODIUM 7.5 MG PO TABS: 7.5000 mg | ORAL_TABLET | Freq: Once | ORAL | Status: DC

## 2012-01-05 NOTE — Consult Note (Signed)
WOC consult Note Vac dressing change to foot wounds.  Anterior wound yellow with exposed tendon.  Mepitel and one piece black sponge applied to wound.  Plantar surface with 2 wounds with black sponge applied to each.  All bridged together and barrier ring applied to edges to maintain seal.  Wounds unchanged in size and appearance from previous assessment.  Bottom of foot and anterior staple site remain with mod amt tan drainage.  Applied hydrofiber to absorb moisture.  Pt tolerated without complaint after meds given earlier.  Ace wrap applied and 125cm cont suction on.  Mod tan drainage in cannister.  Cammie Mcgee, RN, MSN, [employer]  [phone]

## 2012-01-05 NOTE — Progress Notes (Signed)
CARE MANAGEMENT NOTE 01/05/2012  Anticipated DC Date:  01/05/2012   Anticipated DC Plan:  HOME W HOME HEALTH SERVICES          DC Planning Services  CM consult      [facility] Choice  Resumption Of Svcs/PTA Provider   Choice offered to / List presented to:  NA   DME arranged  VAC      DME agency  [facility]     HH arranged  NA      HH agency  [facility]   Status of service:  Completed, signed off Medicare Important Message given?   (If response is "NO", the following Medicare IM given date fields will be blank) Date Medicare IM given:   Date Additional Medicare IM given:    Discharge Disposition:  Home with [facility]  Per UR Regulation:    If discussed at Long Length of Stay Meetings, dates discussed:   01/05/12- In formed LOS committee of plans to discharge patient today.    Comments:  01/04/12 Vance Peper, RN BSN Case ManAGER Spoke with patient at length regarding SNF versus Home Health. He states he can do more at home and has family support. Contacted [facility], patient is acytive with them. Wheelchair will be delivered to patient's home. Ambulance transport him will  be arranged by Child psychotherapist. Wound Vac has been ordered.

## 2012-01-05 NOTE — Progress Notes (Signed)
ANTICOAGULATION CONSULT NOTE - Follow Up Consult  Pharmacy Consult for coumadin Indication: VTE prophylaxis  Allergies  Allergen Reactions  . Sulfonamide Derivatives     REACTION: cramps    Patient Measurements: Height: 5\' 11"  (180.3 cm) Weight: 384 lb 14.8 oz (174.6 kg) IBW/kg (Calculated) : 75.3    Vital Signs: Temp: 98.8 F (37.1 C) (03/20 0617) Temp src: Oral (03/20 0617) BP: 149/87 mmHg (03/20 0617) Pulse Rate: 87  (03/20 0617)  Labs:  Basename 01/05/12 0520 01/04/12 0530 01/03/12 0655  HGB -- 8.0* --  HCT -- 25.4* --  PLT -- 570* --  APTT -- -- --  LABPROT 25.3* 25.3* 27.0*  INR 2.26* 2.26* 2.45*  HEPARINUNFRC -- -- --  CREATININE -- -- --  CKTOTAL -- -- --  CKMB -- -- --  TROPONINI -- -- --   Estimated Creatinine Clearance: 151.2 ml/min (by C-G formula based on Cr of 1.13).   Medications:  Scheduled:     . cefTRIAXone (ROCEPHIN)  IV  2 g Intravenous Q24H  . docusate sodium  100 mg Oral BID  . feeding supplement  30 mL Oral TID BM  . folic acid  1 mg Oral Daily  . insulin aspart  0-15 Units Subcutaneous TID WC  . insulin aspart  0-5 Units Subcutaneous QHS  . insulin aspart  5 Units Subcutaneous TID WC  . insulin glargine  20 Units Subcutaneous QHS  . linagliptin  5 mg Oral Daily  . mulitivitamin with minerals  1 tablet Oral Daily  . polyethylene glycol  17 g Oral Daily  . testosterone  10 g Transdermal Daily  . thiamine  100 mg Oral Daily  . warfarin  7.5 mg Oral ONCE-1800  . warfarin   Does not apply Once  . Warfarin - Pharmacist Dosing Inpatient   Does not apply q1800    Assessment: 36 y.o. M on warfarin for VTE px since I&D of infected L foot on 12/22/11. INR = 2.26 today, therapeutic INR. No active s/sx of bleeding noted.     Goal of Therapy:  INR 2-3   Plan:  1. Coumadin 7.5mg  today and daily if therapy continues as outpt. 2. Will continue to monitor for any signs/symptoms of bleeding and will follow up with PT/INR in the a.m.    Woodfin Ganja , Pharm D 01/05/2012 10:49 AM

## 2012-01-05 NOTE — Discharge Summary (Signed)
NAME:  CESARE, SUMLIN NO.:  MEDICAL RECORD NO.:  [id number]  LOCATION:                                 FACILITY:  PHYSICIAN:  Myrtie Neither, MD      DATE OF BIRTH:  1976-07-15  DATE OF ADMISSION: DATE OF DISCHARGE:                              DISCHARGE SUMMARY   ADMITTING DIAGNOSES: 1. Diabetic left foot with abscess dorsally and plantar aspect of the     left foot cellulitis. 2. Morbid obesity. 3. Diabetes mellitus. 4. Sleep apnea.  DISCHARGE DIAGNOSES: 1. Diabetic left foot with abscess dorsally and plantar aspect of the     left foot cellulitis. 2. Morbid obesity. 3. Diabetes mellitus. 4. Sleep apnea.  COMPLICATIONS:  None.  INFECTIONS:  Abscess, cellulitis, Klebsiella, left foot.  OPERATION:  I and D of left foot and debridement x4, final debridement with application of wound VAC.  The patient also did have a PICC line put in place and medical consultation with [facility] for tachypnea.  PERTINENT HISTORY:  This is a 36 year old, obese diabetic who is being followed in the office for a diabetic ulcers on the plantar aspect of his left foot.  The patient had been treated with oral antibiotics and dressing changes.  The patient's condition progressively worsened.  The patient was on Cipro 500 mg b.i.d.  The patient's left foot had increased number of draining abscesses in the plantar aspect and diffuse swollen dorsal aspect of the left foot.  The patient was admitted for I and D and wound debridement involving the left foot and IV antibiotic treatment.  Pertinent physical was that of the left foot with several draining abscesses on the plantar aspect of the foot about the metatarsal heads and diffuse swelling with increased warmth and discoloration of the dorsal aspect of the forefoot.  The patient had been also having chills and night sweats the night before admission. The patient was admitted to the hospital and had blood cultures  done. CBC, EKG, chest x-ray, CMET, UA, IV antibiotics were started postoperatively.  The patient underwent I and D and debridement with drains placed into both dorsal and plantar aspect of his left foot on December 22, 2011.  The patient had multiple debridements of both the areas to the point where the wound itself began to show some evidence of healthy bleeding with pinkish coloration and very minimal amount of purulent material.  The patient eventually had debridement, followed by a wound VAC treatment, and had wound VAC changed here in the hospital and will be discharged with the use of wound VAC and wound VAC care at home.  The patient was recommended to go to the nursing home but refused.  The patient had physical therapy for getting out of bed and movement from chair to bed and is being updated for extra large wheelchair, home health nurse.  He is on Coumadin, he is to continue that as well as wound VAC changes to the left foot.  The bacterial agent/Klebsiella was found to be sensitive to the Cipro.  The patient will be switched over to oral Cipro 500 mg b.i.d.  MEDICATIONS: 1. Tylenol 2 q.4  h. p.r.n. 2. Xanax 1 mg p.r.n. 3. Percocet 2 q.4 h. p.r.n. for pain. 4. Feosol 325 mg t.i.d. 5. Tradjenta 5 mg daily. 6. Multivitamin daily. 7. The patient was placed on AndroGel 10 mg transdermally daily per     Dr. Bascom Levels. 8. Coumadin 7.5 mg daily. 9. CPAP for home use.  The patient is to use daily physical therapy with heel only weightbearing and active exercises for both upper extremities and the left lower extremity.  Spirometry q. hour while awake 2-3 minutes.  The patient is high risk discharge.  The patient is being discharged home only after recommending nursing home placement and the patient is refusing and wishes to go home.  Physical therapy and home health has been arranged for this patient.  Transportation also will be arranged for the patient with ambulance transportation  because the patient has 3 steps in order to get into his home.  The patient will be seen back in the office in 1 week.     Myrtie Neither, MD     AC/MEDQ  D:  01/05/2012  T:  01/05/2012  Job:  [id number]

## 2012-09-12 ENCOUNTER — Other Ambulatory Visit (HOSPITAL_BASED_OUTPATIENT_CLINIC_OR_DEPARTMENT_OTHER): Payer: Self-pay | Admitting: General Surgery

## 2012-09-12 ENCOUNTER — Encounter (HOSPITAL_BASED_OUTPATIENT_CLINIC_OR_DEPARTMENT_OTHER): Payer: PRIVATE HEALTH INSURANCE | Attending: General Surgery

## 2012-09-12 ENCOUNTER — Ambulatory Visit (HOSPITAL_COMMUNITY)
Admission: RE | Admit: 2012-09-12 | Discharge: 2012-09-12 | Disposition: A | Discharge status: Home / Self Care | Payer: PRIVATE HEALTH INSURANCE | Source: Ambulatory Visit | Attending: General Surgery | Admitting: General Surgery

## 2012-09-12 DIAGNOSIS — L97509 Non-pressure chronic ulcer of other part of unspecified foot with unspecified severity: Secondary | ICD-10-CM | POA: Insufficient documentation

## 2012-09-12 DIAGNOSIS — E1069 Type 1 diabetes mellitus with other specified complication: Secondary | ICD-10-CM | POA: Insufficient documentation

## 2012-09-12 DIAGNOSIS — M869 Osteomyelitis, unspecified: Secondary | ICD-10-CM

## 2012-09-12 DIAGNOSIS — I10 Essential (primary) hypertension: Secondary | ICD-10-CM | POA: Insufficient documentation

## 2012-09-12 DIAGNOSIS — G4730 Sleep apnea, unspecified: Secondary | ICD-10-CM | POA: Insufficient documentation

## 2012-09-12 DIAGNOSIS — S98139A Complete traumatic amputation of one unspecified lesser toe, initial encounter: Secondary | ICD-10-CM | POA: Insufficient documentation

## 2012-09-12 DIAGNOSIS — T148XXA Other injury of unspecified body region, initial encounter: Secondary | ICD-10-CM

## 2012-09-12 DIAGNOSIS — Z79899 Other long term (current) drug therapy: Secondary | ICD-10-CM | POA: Insufficient documentation

## 2012-09-12 NOTE — H&P (Signed)
Adrian Khan, Adrian Khan NO.:  [id number]  MEDICAL RECORD NO.:  [id number]  LOCATION:  FOOT                         FACILITY:  [facility]  PHYSICIAN:  Joanne Gavel, M.D.        DATE OF BIRTH:  06-26-1976  DATE OF ADMISSION:  09/12/2012 DATE OF DISCHARGE:                             HISTORY & PHYSICAL   CHIEF COMPLAINT:  Diabetic foot ulcer, left plantar foot.  HISTORY OF PRESENT ILLNESS:  This 36 year old male has been juvenile diabetic since age 15.  He has been morbidly obese since childhood. Approximately 11 months ago, he developed an infection of the foot with cellulitis.  He was treated by Dr. Montez Morita with hospital admission and 5 drainages and debridements.  He then disappeared from Dr. Celene Skeen care and comes today with fetid large plantar ulceration and very swollen foot.  PAST MEDICAL HISTORY: 1. Diabetes type 1. 2. Hypertension. 3. Sleep apnea with CPAP. 4. Underdeveloped pituitary gland. 5. Retinopathy. 6. History of osteomyelitis of the right foot.  PAST SURGICAL HISTORY:  He has had right second toe partial amputation, laser surgery for his eyes, and multiple debridements of the left plantar foot.  SOCIAL HISTORY:  Cigarettes none.  Alcohol, occasionally.  MEDICATIONS:  He does not have a complete list, but knows he has been on Avandia, Glucophage, Testim, and potassium chloride.  ALLERGIES:  SULFA, causes upset stomach.  REVIEW OF SYSTEMS:  Essentially as above.  He has no heart disease, lung disease, or kidney disease.  PHYSICAL EXAMINATION:  VITAL SIGNS:  Temperature 99.2, pulse 106, respirations 19, blood pressure 130/84.  Capillary glucose is 279. Weight is 382 pounds per the patient. GENERAL APPEARANCE:  Well-developed, very obese male in no distress. HEENT:  Cranium is normocephalic.  Eyes, ears, nose, throat are negative to cursory examination. CHEST:  Clear. HEART:  Regular rhythm. EXTREMITIES:  Examination of the feet reveals no  pulses are palpable. The left foot is clearly swollen.  There is a 5.2 x 5.0 x 1.0 ulceration on the plantar foot.  After debridement, this goes directly to the bone and I believe the bone has a small fracture that I can feel by palpation.  There is a great deal of necrotic tissue and there is no bleeding after debridement.  IMPRESSION:  Diabetic foot ulcer Wagner 4.  Major amputation is imminent.  I have ordered routine blood work, x-rays of the foot, vascular studies, taken a tissue culture, urgent Orthopedic consult, and we will see the patient in 7 days.  If Orthopedics is able to clean the wound and biopsy of the bone or clear the bone that probably has osteomyelitis, we can consider HBO.  Otherwise, I think amputation is clear danger.     Joanne Gavel, M.D.     RA/MEDQ  D:  09/12/2012  T:  09/12/2012  Job:  [id number]  cc:   Myrtie Neither, MD

## 2012-09-19 ENCOUNTER — Encounter (HOSPITAL_BASED_OUTPATIENT_CLINIC_OR_DEPARTMENT_OTHER): Payer: PRIVATE HEALTH INSURANCE

## 2012-09-19 ENCOUNTER — Other Ambulatory Visit (HOSPITAL_COMMUNITY): Payer: Self-pay | Admitting: Orthopedic Surgery

## 2012-09-20 ENCOUNTER — Encounter (HOSPITAL_COMMUNITY)
Admission: RE | Admit: 2012-09-20 | Discharge: 2012-09-20 | Disposition: A | Discharge status: Home / Self Care | Payer: PRIVATE HEALTH INSURANCE | Source: Ambulatory Visit | Attending: Orthopedic Surgery | Admitting: Orthopedic Surgery

## 2012-09-20 ENCOUNTER — Encounter (HOSPITAL_COMMUNITY): Payer: Self-pay

## 2012-09-20 ENCOUNTER — Encounter (HOSPITAL_COMMUNITY): Payer: Self-pay | Admitting: Vascular Surgery

## 2012-09-20 ENCOUNTER — Other Ambulatory Visit: Payer: Self-pay

## 2012-09-20 HISTORY — DX: Anxiety disorder, unspecified: F41.9

## 2012-09-20 HISTORY — DX: Gastro-esophageal reflux disease without esophagitis: K21.9

## 2012-09-20 HISTORY — DX: Shortness of breath: R06.02

## 2012-09-20 HISTORY — DX: Sleep apnea, unspecified: G47.30

## 2012-09-20 HISTORY — DX: Mental disorder, not otherwise specified: F99

## 2012-09-20 HISTORY — DX: Major depressive disorder, single episode, unspecified: F32.9

## 2012-09-20 HISTORY — DX: Depression, unspecified: F32.A

## 2012-09-20 HISTORY — DX: Constipation, unspecified: K59.00

## 2012-09-20 HISTORY — DX: Polyneuropathy, unspecified: G62.9

## 2012-09-20 LAB — SURGICAL PCR SCREEN: MRSA, PCR: POSITIVE — AB

## 2012-09-20 NOTE — Consult Note (Signed)
Anesthesia Consult:  Patient is a 36 year old male scheduled for left mid foot amputation, possible BKA by Dr. Lajoyce Corners on 09/22/12  History includes DM2 currently not controlled with A1C 12.4 on 09/15/12, morbid obesity, OSA, GERD, anxiety (with "needle phobia"), depression, PNA.  He is s/p I&D of a left foot wound X 4 in March 2013.  He has been followed at [facility], but despite care has developed osteomyelitis and abscess.  His WBC was up to 17K last week.  He denies fever, but has felt generally tired.  He developed a cough last week which has clear sputum.  CXR was done on 1126/13 that showed no edema of consolidation.  He denies chest pain.  His sugars have been primarily in the 200 range.   Labs from 09/15/12 from [facility] show a WBC 17.0, H/H 11.8/35.0, PLT 433.  Na 137, K 4.8, glucose 302, BUN 17, Cr 1.15, AST/ALT WNL.  PT/PTT results from today are pending  EKG today showed NSR.   Echo on 12/23/11 showed: - Left ventricle: The cavity size was normal. Wall thickness was increased in a pattern of mild LVH. Systolic function was normal. The estimated ejection fraction was in the range of 60% to 65%. Indeterminant diastolic function. Although no diagnostic regional wall motion abnormality was identified, this possibility cannot be completely excluded on the basis of this study. - Aortic valve: There was no stenosis. - Mitral valve: No significant regurgitation. - Left atrium: The atrium was mildly dilated. - Right ventricle: The cavity size was mildly dilated. Systolic function was normal. - Systemic veins: IVC poorly visualized. Impressions: - Technically difficult study with poor acoustic windows. Normal LV size with mild LV hypertrophy, EF 55-60%. Mildly dilated RV with normal systolic function. No significant valvular dysfunction.  Exam shows a morbidly obese black male, in NAD.  Heart sounds are distant, but RRR.  Lungs clear.  He requests a PICC line pre- or  post-operatively due to his needle phobia and need for frequent lab draws during his last hospitalization.  He or his mom will contact Dr. Audrie Lia office to discuss further.  Anesthesiologist Dr. Krista Blue updated.  Patient will be evaluated by his assigned Anesthesiologist on the day of surgery.  Shonna Chock, PA-C 09/20/12 [phone]

## 2012-09-20 NOTE — Pre-Procedure Instructions (Signed)
20 KLINE BULTHUIS  09/20/2012   Your procedure is scheduled on:  Friday, December 6th.  Report to [facility] at 10:30 AM.  Call this number if you have problems the morning of surgery: [phone]   Remember: Nothing to eat or drink after Midnight.     Take these medicines the morning of surgery with A SIP OF WATER: Metoprolol (Toprol XL).   Stop taking Ibuprofen (Advil).    Do not wear jewelry, make-up or nail polish.  Do not wear lotions, powders, or perfumes. You may wear deodorant.  Do not shave 48 hours prior to surgery. Men may shave face and neck.  Do not bring valuables to the hospital.  Contacts, dentures or bridgework may not be worn into surgery.  Leave suitcase in the car. After surgery it may be brought to your room.  For patients admitted to the hospital, checkout time is 11:00 AM the day of discharge.   Patients discharged the day of surgery will not be allowed to drive home.  Name and phone number of your driver: NA    Special Instructions: Shower using CHG 2 nights before surgery and the night before surgery.  If you shower the day of surgery use CHG.  Use special wash - you have one bottle of CHG for all showers.  You should use approximately 1/3 of the bottle for each shower.   Please read over the following fact sheets that you were given: Pain Booklet, Coughing and Deep Breathing, Blood Transfusion Information and Surgical Site Infection Prevention

## 2012-09-20 NOTE — Progress Notes (Signed)
Pt had a CBC with diff and CMET  11/26, lab results obtained and placed on chart.

## 2012-09-21 ENCOUNTER — Other Ambulatory Visit (HOSPITAL_COMMUNITY): Payer: Self-pay | Admitting: Orthopedic Surgery

## 2012-09-21 ENCOUNTER — Encounter (HOSPITAL_COMMUNITY): Payer: Self-pay | Admitting: Pharmacy Technician

## 2012-09-21 MED ORDER — DEXTROSE 5 % IV SOLN
3.0000 g | INTRAVENOUS | Status: AC
  Administered 2012-09-22: 3 g via INTRAVENOUS
  Filled 2012-09-21: qty 3000

## 2012-09-22 ENCOUNTER — Encounter (HOSPITAL_COMMUNITY): Payer: Self-pay | Admitting: Vascular Surgery

## 2012-09-22 ENCOUNTER — Encounter (HOSPITAL_COMMUNITY): Payer: Self-pay | Admitting: *Deleted

## 2012-09-22 ENCOUNTER — Encounter (HOSPITAL_COMMUNITY)
Admission: RE | Disposition: A | Discharge status: Home Health | Payer: Self-pay | Source: Ambulatory Visit | Attending: Orthopedic Surgery

## 2012-09-22 ENCOUNTER — Ambulatory Visit (HOSPITAL_COMMUNITY): Payer: PRIVATE HEALTH INSURANCE | Admitting: Vascular Surgery

## 2012-09-22 ENCOUNTER — Inpatient Hospital Stay (HOSPITAL_COMMUNITY)
Admission: RE | Admit: 2012-09-22 | Discharge: 2012-09-25 | DRG: 617 | Disposition: A | Discharge status: Home Health | Payer: PRIVATE HEALTH INSURANCE | Source: Ambulatory Visit | Attending: Orthopedic Surgery | Admitting: Orthopedic Surgery

## 2012-09-22 DIAGNOSIS — E119 Type 2 diabetes mellitus without complications: Secondary | ICD-10-CM

## 2012-09-22 DIAGNOSIS — F329 Major depressive disorder, single episode, unspecified: Secondary | ICD-10-CM | POA: Diagnosis present

## 2012-09-22 DIAGNOSIS — E1169 Type 2 diabetes mellitus with other specified complication: Principal | ICD-10-CM | POA: Diagnosis present

## 2012-09-22 DIAGNOSIS — F3289 Other specified depressive episodes: Secondary | ICD-10-CM | POA: Diagnosis present

## 2012-09-22 DIAGNOSIS — M869 Osteomyelitis, unspecified: Secondary | ICD-10-CM | POA: Diagnosis present

## 2012-09-22 DIAGNOSIS — F411 Generalized anxiety disorder: Secondary | ICD-10-CM | POA: Diagnosis present

## 2012-09-22 DIAGNOSIS — E1142 Type 2 diabetes mellitus with diabetic polyneuropathy: Secondary | ICD-10-CM | POA: Diagnosis present

## 2012-09-22 DIAGNOSIS — G4730 Sleep apnea, unspecified: Secondary | ICD-10-CM | POA: Diagnosis present

## 2012-09-22 DIAGNOSIS — E1149 Type 2 diabetes mellitus with other diabetic neurological complication: Secondary | ICD-10-CM | POA: Diagnosis present

## 2012-09-22 DIAGNOSIS — K5900 Constipation, unspecified: Secondary | ICD-10-CM | POA: Diagnosis present

## 2012-09-22 DIAGNOSIS — M9080 Osteopathy in diseases classified elsewhere, unspecified site: Secondary | ICD-10-CM | POA: Diagnosis present

## 2012-09-22 DIAGNOSIS — L97509 Non-pressure chronic ulcer of other part of unspecified foot with unspecified severity: Secondary | ICD-10-CM | POA: Diagnosis present

## 2012-09-22 DIAGNOSIS — K219 Gastro-esophageal reflux disease without esophagitis: Secondary | ICD-10-CM | POA: Diagnosis present

## 2012-09-22 DIAGNOSIS — G609 Hereditary and idiopathic neuropathy, unspecified: Secondary | ICD-10-CM | POA: Diagnosis present

## 2012-09-22 DIAGNOSIS — L02619 Cutaneous abscess of unspecified foot: Secondary | ICD-10-CM

## 2012-09-22 DIAGNOSIS — Z8701 Personal history of pneumonia (recurrent): Secondary | ICD-10-CM

## 2012-09-22 HISTORY — PX: AMPUTATION: SHX166

## 2012-09-22 LAB — GLUCOSE, CAPILLARY
Glucose-Capillary: 248 mg/dL — ABNORMAL HIGH (ref 70–99)
Glucose-Capillary: 213 mg/dL — ABNORMAL HIGH (ref 70–99)
Glucose-Capillary: 250 mg/dL — ABNORMAL HIGH (ref 70–99)

## 2012-09-22 LAB — PROTIME-INR
INR: 1.02 (ref 0.00–1.49)
Prothrombin Time: 13.3 seconds (ref 11.6–15.2)

## 2012-09-22 LAB — CREATININE, SERUM: Creatinine, Ser: 1.29 mg/dL (ref 0.50–1.35)

## 2012-09-22 LAB — CBC
HCT: 28.6 % — ABNORMAL LOW (ref 39.0–52.0)
MCHC: 32.9 g/dL (ref 30.0–36.0)
MCV: 83.1 fL (ref 78.0–100.0)
Platelets: 382 10*3/uL (ref 150–400)
RDW: 12.1 % (ref 11.5–15.5)
WBC: 16.9 10*3/uL — ABNORMAL HIGH (ref 4.0–10.5)

## 2012-09-22 SURGERY — AMPUTATION, FOOT, PARTIAL
Anesthesia: General | Site: Foot | Laterality: Left | Wound class: Dirty or Infected

## 2012-09-22 MED ORDER — ONDANSETRON HCL 4 MG/2ML IJ SOLN
INTRAMUSCULAR | Status: AC
  Administered 2012-09-22: 4 mg
  Filled 2012-09-22: qty 2

## 2012-09-22 MED ORDER — OXYCODONE HCL 5 MG/5ML PO SOLN: 5.0000 mg | Freq: Once | ORAL | Status: DC | PRN

## 2012-09-22 MED ORDER — ONDANSETRON HCL 4 MG/2ML IJ SOLN: 4.0000 mg | Freq: Once | INTRAMUSCULAR | Status: DC | PRN

## 2012-09-22 MED ORDER — OXYCODONE-ACETAMINOPHEN 5-325 MG PO TABS
1.0000 | ORAL_TABLET | ORAL | Status: DC | PRN
  Administered 2012-09-22 – 2012-09-24 (×4): 2 via ORAL
  Filled 2012-09-22 (×3): qty 2

## 2012-09-22 MED ORDER — MEPERIDINE HCL 25 MG/ML IJ SOLN: 6.2500 mg | INTRAMUSCULAR | Status: DC | PRN

## 2012-09-22 MED ORDER — LINAGLIPTIN 5 MG PO TABS
5.0000 mg | ORAL_TABLET | Freq: Every day | ORAL | Status: DC
  Administered 2012-09-23 – 2012-09-25 (×3): 5 mg via ORAL
  Filled 2012-09-22 (×4): qty 1

## 2012-09-22 MED ORDER — METOCLOPRAMIDE HCL 10 MG PO TABS: 5.0000 mg | ORAL_TABLET | Freq: Three times a day (TID) | ORAL | Status: DC | PRN

## 2012-09-22 MED ORDER — ROCURONIUM BROMIDE 100 MG/10ML IV SOLN
INTRAVENOUS | Status: DC | PRN
  Administered 2012-09-22: 10 mg via INTRAVENOUS

## 2012-09-22 MED ORDER — ONDANSETRON HCL 4 MG PO TABS
4.0000 mg | ORAL_TABLET | Freq: Four times a day (QID) | ORAL | Status: DC | PRN
  Administered 2012-09-23 – 2012-09-24 (×3): 4 mg via ORAL
  Filled 2012-09-22 (×3): qty 1

## 2012-09-22 MED ORDER — FENTANYL CITRATE 0.05 MG/ML IJ SOLN
INTRAMUSCULAR | Status: DC | PRN
  Administered 2012-09-22: 150 ug via INTRAVENOUS
  Administered 2012-09-22: 50 ug via INTRAVENOUS

## 2012-09-22 MED ORDER — WARFARIN SODIUM 10 MG PO TABS
10.0000 mg | ORAL_TABLET | Freq: Once | ORAL | Status: AC
  Administered 2012-09-22: 10 mg via ORAL
  Filled 2012-09-22 (×2): qty 1

## 2012-09-22 MED ORDER — OXYCODONE-ACETAMINOPHEN 5-325 MG PO TABS
ORAL_TABLET | ORAL | Status: AC
  Filled 2012-09-22: qty 2

## 2012-09-22 MED ORDER — HYDROMORPHONE HCL PF 1 MG/ML IJ SOLN
0.2500 mg | INTRAMUSCULAR | Status: DC | PRN
  Administered 2012-09-22: 0.5 mg via INTRAVENOUS

## 2012-09-22 MED ORDER — MIDAZOLAM HCL 5 MG/5ML IJ SOLN
INTRAMUSCULAR | Status: DC | PRN
  Administered 2012-09-22: 1 mg via INTRAVENOUS

## 2012-09-22 MED ORDER — SODIUM CHLORIDE 0.9 % IV SOLN: INTRAVENOUS | Status: DC

## 2012-09-22 MED ORDER — HYDROMORPHONE HCL PF 1 MG/ML IJ SOLN
INTRAMUSCULAR | Status: AC
  Filled 2012-09-22: qty 1

## 2012-09-22 MED ORDER — VANCOMYCIN HCL IN DEXTROSE 1-5 GM/200ML-% IV SOLN
1000.0000 mg | Freq: Two times a day (BID) | INTRAVENOUS | Status: DC
  Filled 2012-09-22 (×2): qty 200

## 2012-09-22 MED ORDER — WARFARIN - PHARMACIST DOSING INPATIENT: Freq: Every day | Status: DC

## 2012-09-22 MED ORDER — LACTATED RINGERS IV SOLN
INTRAVENOUS | Status: DC | PRN
  Administered 2012-09-22: 12:00:00 via INTRAVENOUS

## 2012-09-22 MED ORDER — LIDOCAINE HCL (CARDIAC) 20 MG/ML IV SOLN
INTRAVENOUS | Status: DC | PRN
  Administered 2012-09-22: 100 mg via INTRAVENOUS

## 2012-09-22 MED ORDER — LACTATED RINGERS IV SOLN
INTRAVENOUS | Status: DC
  Administered 2012-09-22: 12:00:00 via INTRAVENOUS

## 2012-09-22 MED ORDER — INSULIN ASPART 100 UNIT/ML ~~LOC~~ SOLN
0.0000 [IU] | Freq: Three times a day (TID) | SUBCUTANEOUS | Status: DC
  Administered 2012-09-22: 5 [IU] via SUBCUTANEOUS

## 2012-09-22 MED ORDER — WARFARIN VIDEO: Freq: Once | Status: DC

## 2012-09-22 MED ORDER — METOCLOPRAMIDE HCL 5 MG/ML IJ SOLN: 5.0000 mg | Freq: Three times a day (TID) | INTRAMUSCULAR | Status: DC | PRN

## 2012-09-22 MED ORDER — VANCOMYCIN HCL 10 G IV SOLR
1500.0000 mg | Freq: Two times a day (BID) | INTRAVENOUS | Status: AC
  Administered 2012-09-22 – 2012-09-25 (×6): 1500 mg via INTRAVENOUS
  Filled 2012-09-22 (×8): qty 1500

## 2012-09-22 MED ORDER — ONDANSETRON HCL 4 MG/2ML IJ SOLN: 4.0000 mg | Freq: Four times a day (QID) | INTRAMUSCULAR | Status: DC | PRN

## 2012-09-22 MED ORDER — LISINOPRIL 20 MG PO TABS
20.0000 mg | ORAL_TABLET | Freq: Every day | ORAL | Status: DC
  Administered 2012-09-23 – 2012-09-25 (×3): 20 mg via ORAL
  Filled 2012-09-22 (×4): qty 1

## 2012-09-22 MED ORDER — HYDROCODONE-ACETAMINOPHEN 5-325 MG PO TABS
1.0000 | ORAL_TABLET | ORAL | Status: DC | PRN
  Administered 2012-09-22: 1 via ORAL
  Filled 2012-09-22: qty 2

## 2012-09-22 MED ORDER — INSULIN ASPART 100 UNIT/ML ~~LOC~~ SOLN
SUBCUTANEOUS | Status: AC
  Filled 2012-09-22: qty 1

## 2012-09-22 MED ORDER — METHOCARBAMOL 100 MG/ML IJ SOLN
500.0000 mg | Freq: Four times a day (QID) | INTRAMUSCULAR | Status: DC | PRN
  Filled 2012-09-22: qty 5

## 2012-09-22 MED ORDER — METHOCARBAMOL 500 MG PO TABS
500.0000 mg | ORAL_TABLET | Freq: Four times a day (QID) | ORAL | Status: DC | PRN
  Administered 2012-09-22 – 2012-09-24 (×3): 500 mg via ORAL
  Filled 2012-09-22 (×4): qty 1

## 2012-09-22 MED ORDER — HYDROMORPHONE HCL PF 1 MG/ML IJ SOLN: 0.5000 mg | INTRAMUSCULAR | Status: DC | PRN

## 2012-09-22 MED ORDER — PROPOFOL 10 MG/ML IV BOLUS
INTRAVENOUS | Status: DC | PRN
  Administered 2012-09-22: 50 mg via INTRAVENOUS
  Administered 2012-09-22: 150 mg via INTRAVENOUS

## 2012-09-22 MED ORDER — ARTIFICIAL TEARS OP OINT
TOPICAL_OINTMENT | OPHTHALMIC | Status: DC | PRN
  Administered 2012-09-22: 1 via OPHTHALMIC

## 2012-09-22 MED ORDER — METHOCARBAMOL 500 MG PO TABS
ORAL_TABLET | ORAL | Status: AC
  Filled 2012-09-22: qty 1

## 2012-09-22 MED ORDER — COUMADIN BOOK
Freq: Once | Status: DC
  Filled 2012-09-22: qty 1

## 2012-09-22 MED ORDER — INSULIN ASPART 100 UNIT/ML ~~LOC~~ SOLN: 4.0000 [IU] | Freq: Three times a day (TID) | SUBCUTANEOUS | Status: DC

## 2012-09-22 MED ORDER — ONDANSETRON HCL 4 MG/2ML IJ SOLN
INTRAMUSCULAR | Status: DC | PRN
  Administered 2012-09-22: 4 mg via INTRAVENOUS

## 2012-09-22 MED ORDER — OXYCODONE HCL 5 MG PO TABS: 5.0000 mg | ORAL_TABLET | Freq: Once | ORAL | Status: DC | PRN

## 2012-09-22 MED ORDER — SUCCINYLCHOLINE CHLORIDE 20 MG/ML IJ SOLN
INTRAMUSCULAR | Status: DC | PRN
  Administered 2012-09-22: 160 mg via INTRAVENOUS

## 2012-09-22 SURGICAL SUPPLY — 38 items
BANDAGE GAUZE ELAST BULKY 4 IN (GAUZE/BANDAGES/DRESSINGS) ×2 IMPLANT
BLADE SAW SGTL HD 18.5X60.5X1. (BLADE) ×2 IMPLANT
BLADE SURG 10 STRL SS (BLADE) IMPLANT
BNDG COHESIVE 4X5 TAN STRL (GAUZE/BANDAGES/DRESSINGS) ×2 IMPLANT
CLOTH BEACON ORANGE TIMEOUT ST (SAFETY) ×2 IMPLANT
COVER SURGICAL LIGHT HANDLE (MISCELLANEOUS) ×2 IMPLANT
CUFF TOURNIQUET SINGLE 34IN LL (TOURNIQUET CUFF) IMPLANT
CUFF TOURNIQUET SINGLE 44IN (TOURNIQUET CUFF) IMPLANT
DRAPE U-SHAPE 47X51 STRL (DRAPES) ×2 IMPLANT
DRSG ADAPTIC 3X8 NADH LF (GAUZE/BANDAGES/DRESSINGS) ×2 IMPLANT
DRSG PAD ABDOMINAL 8X10 ST (GAUZE/BANDAGES/DRESSINGS) ×2 IMPLANT
DURAPREP 26ML APPLICATOR (WOUND CARE) ×2 IMPLANT
ELECT REM PT RETURN 9FT ADLT (ELECTROSURGICAL) ×2
ELECTRODE REM PT RTRN 9FT ADLT (ELECTROSURGICAL) ×1 IMPLANT
GLOVE BIOGEL PI IND STRL 9 (GLOVE) ×1 IMPLANT
GLOVE BIOGEL PI INDICATOR 9 (GLOVE) ×1
GLOVE SURG ORTHO 9.0 STRL STRW (GLOVE) ×2 IMPLANT
GOWN PREVENTION PLUS XLARGE (GOWN DISPOSABLE) ×2 IMPLANT
GOWN SRG XL XLNG 56XLVL 4 (GOWN DISPOSABLE) ×2 IMPLANT
GOWN STRL NON-REIN XL XLG LVL4 (GOWN DISPOSABLE) ×2
KIT BASIN OR (CUSTOM PROCEDURE TRAY) ×2 IMPLANT
KIT ROOM TURNOVER OR (KITS) ×2 IMPLANT
MANIFOLD NEPTUNE II (INSTRUMENTS) ×2 IMPLANT
NS IRRIG 1000ML POUR BTL (IV SOLUTION) ×2 IMPLANT
PACK ORTHO EXTREMITY (CUSTOM PROCEDURE TRAY) ×2 IMPLANT
PAD ARMBOARD 7.5X6 YLW CONV (MISCELLANEOUS) ×4 IMPLANT
PAD CAST 4YDX4 CTTN HI CHSV (CAST SUPPLIES) ×1 IMPLANT
PADDING CAST COTTON 4X4 STRL (CAST SUPPLIES) ×1
SPONGE GAUZE 4X4 12PLY (GAUZE/BANDAGES/DRESSINGS) ×2 IMPLANT
SPONGE LAP 18X18 X RAY DECT (DISPOSABLE) ×2 IMPLANT
STAPLER VISISTAT 35W (STAPLE) ×2 IMPLANT
SUT ETHILON 2 0 PSLX (SUTURE) ×6 IMPLANT
SUT VIC AB 2-0 CTB1 (SUTURE) ×4 IMPLANT
TOWEL OR 17X24 6PK STRL BLUE (TOWEL DISPOSABLE) ×2 IMPLANT
TOWEL OR 17X26 10 PK STRL BLUE (TOWEL DISPOSABLE) ×2 IMPLANT
TUBE CONNECTING 12X1/4 (SUCTIONS) ×2 IMPLANT
WATER STERILE IRR 1000ML POUR (IV SOLUTION) ×2 IMPLANT
YANKAUER SUCT BULB TIP NO VENT (SUCTIONS) ×2 IMPLANT

## 2012-09-22 NOTE — Anesthesia Preprocedure Evaluation (Addendum)
Anesthesia Evaluation  Patient identified by MRN, date of birth, ID band Patient awake    Reviewed: Allergy & Precautions, H&P , NPO status , Patient's Chart, lab work & pertinent test results  History of Anesthesia Complications (+) PONV  Airway Mallampati: I TM Distance: >3 FB Neck ROM: Full    Dental   Pulmonary sleep apnea ,          Cardiovascular     Neuro/Psych    GI/Hepatic GERD-  Controlled,  Endo/Other  diabetes, Poorly Controlled, Type 2, Oral Hypoglycemic Agents  Renal/GU      Musculoskeletal   Abdominal   Peds  Hematology   Anesthesia Other Findings   Reproductive/Obstetrics                          Anesthesia Physical Anesthesia Plan  ASA: III  Anesthesia Plan: General   Post-op Pain Management:    Induction: Intravenous  Airway Management Planned: Oral ETT  Additional Equipment:   Intra-op Plan:   Post-operative Plan: Extubation in OR  Informed Consent: I have reviewed the patients History and Physical, chart, labs and discussed the procedure including the risks, benefits and alternatives for the proposed anesthesia with the patient or authorized representative who has indicated his/her understanding and acceptance.     Plan Discussed with: CRNA and Surgeon  Anesthesia Plan Comments:        Anesthesia Quick Evaluation

## 2012-09-22 NOTE — Anesthesia Postprocedure Evaluation (Signed)
Anesthesia Post Note  Patient: Adrian Khan  Procedure(s) Performed: Procedure(s) (LRB): AMPUTATION FOOT (Left)  Anesthesia type: general  Patient location: PACU  Post pain: Pain level controlled  Post assessment: Patient's Cardiovascular Status Stable  Last Vitals:  Filed Vitals:   09/22/12 1330  BP: 100/69  Pulse: 95  Temp: 36.1 C  Resp: 20    Post vital signs: Reviewed and stable  Level of consciousness: sedated  Complications: No apparent anesthesia complications

## 2012-09-22 NOTE — Preoperative (Signed)
Beta Blockers   Reason not to administer Beta Blockers:Not Applicable 

## 2012-09-22 NOTE — Op Note (Signed)
OPERATIVE REPORT  DATE OF SURGERY: 09/22/2012  PATIENT:  Adrian Khan,  36 y.o. male  PRE-OPERATIVE DIAGNOSIS:  osteomyelitis abscess left foot  POST-OPERATIVE DIAGNOSIS:  osteomyelitis abscess left foot  PROCEDURE:  Procedure(s): AMPUTATION FOOT transmetatarsal  SURGEON:  Surgeon(s): Nadara Mustard, MD  ANESTHESIA:   general  EBL:  Minimal ML  SPECIMEN:  Source of Specimen:  Left forefoot  TOURNIQUET:  * No tourniquets in log *  PROCEDURE DETAILS: Patient is a 36 year old gentleman BMI greater than 55 diabetes uncontrolled hypertension who has undergone multiple surgical interventions for his left foot. Patient was recently states he was going to be going to the hyperbaric clinic. Discussed that this is a limb salvage critical issue and patient presents at this time for attempted foot salvage with a transmetatarsal amputation with large mass of necrotic wound over the forefoot. Risk and benefits were discussed including potential for transtibial amputation. Patient states he understands was pursued this time. Description of procedure patient was brought to the operating room underwent a general anesthetic. After adequate levels and anesthesia obtained patient's left lower extremity was prepped using DuraPrep and draped into a sterile field. A fishmouth incision was made with a large dorsal flap there was a large wound plantarly as much plantar skin as could be protected was saved. A transmetatarsal amputation was performed hemostasis was obtained. The wound was irrigated normal saline. The skin edges close without tension the skin using 2-0 nylon. The wound was covered with Adaptic orthopedic sponges AB dressing Kerlix and Coban. Patient was extubated taken to the PACU in stable condition.  PLAN OF CARE: Admit to inpatient   PATIENT DISPOSITION:  PACU - hemodynamically stable.   Nadara Mustard, MD 09/22/2012 1:25 PM

## 2012-09-22 NOTE — Progress Notes (Signed)
Patient ID: Adrian Khan, male   DOB: November 01, 1975, 37 y.o.   MRN: [id number] Plan for 3 days of IV vancomycin. Discharged to home on Monday.

## 2012-09-22 NOTE — Progress Notes (Signed)
Notified Dr. Michelle Piper of pt.'s blood pressure and needle phobia. Pt. Stating he is nervous and scared of needles. No new orders.

## 2012-09-22 NOTE — Anesthesia Procedure Notes (Signed)
Procedure Name: Intubation Date/Time: 09/22/2012 12:44 PM Performed by: Sherie Don Pre-anesthesia Checklist: Patient identified, Emergency Drugs available, Suction available, Patient being monitored and Timeout performed Patient Re-evaluated:Patient Re-evaluated prior to inductionOxygen Delivery Method: Circle system utilized Preoxygenation: Pre-oxygenation with 100% oxygen Intubation Type: IV induction and Rapid sequence Laryngoscope Size: Mac and 4 Grade View: Grade II Tube type: Oral Number of attempts: 1 Airway Equipment and Method: Stylet and Patient positioned with wedge pillow (sniffing position and 3 minutes preo2) Placement Confirmation: ETT inserted through vocal cords under direct vision,  positive ETCO2 and breath sounds checked- equal and bilateral Secured at: 23 cm Tube secured with: Tape Dental Injury: Teeth and Oropharynx as per pre-operative assessment  Future Recommendations: Recommend- induction with short-acting agent, and alternative techniques readily available

## 2012-09-22 NOTE — H&P (Signed)
Adrian Khan is an 36 y.o. male.   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  Past Medical History  Diagnosis Date  . Shortness of breath     with exertion  . PONV (postoperative nausea and vomiting)   . Diabetes mellitus without complication   . Anxiety   . Mental disorder   . Depression   . Sleep apnea     done at [facility].  Marland Kitchen Pneumonia   . GERD (gastroesophageal reflux disease)   . Constipation   . Neuropathy     feet, hands  . Anginal pain     chest pressure has increased in the past few days; patient denied  on 09/20/12    Past Surgical History  Procedure Date  . I&d extremity 12/22/2011    Procedure: IRRIGATION AND DEBRIDEMENT EXTREMITY;  Surgeon: Kennieth Rad, MD;  Location: [facility] OR;  Service: Orthopedics;  Laterality: Left;  . I&d extremity 12/24/2011    Procedure: IRRIGATION AND DEBRIDEMENT EXTREMITY;  Surgeon: Kennieth Rad, MD;  Location: [facility] OR;  Service: Orthopedics;  Laterality: Left;  Irrigation and debridement left foot.  . I&d extremity 12/26/2011    Procedure: IRRIGATION AND DEBRIDEMENT EXTREMITY;  Surgeon: Kennieth Rad, MD;  Location: [facility] OR;  Service: Orthopedics;  Laterality: Left;  . I&d extremity 12/28/2011    Procedure: IRRIGATION AND DEBRIDEMENT EXTREMITY;  Surgeon: Kennieth Rad, MD;  Location: [facility] OR;  Service: Orthopedics;  Laterality: Left;  I&D Left Foot;Application of  Wound Vac.to dorsal and plantar areas of left foot  . Eye surgery   . Toe surgery     Bone removed- 2 toe right  . Wisdom tooth extraction     History reviewed. No pertinent family history. Social History:  reports that he has never smoked. He has never used smokeless tobacco. He reports that he does not drink alcohol or use illicit drugs.  Allergies:  Allergies  Allergen Reactions  . Sulfonamide Derivatives Other (See Comments)     cramps    Medications Prior to Admission  Medication Sig Dispense Refill  . doxycycline (DORYX) 100 MG DR capsule Take 100 mg by mouth 2 (two) times daily.      . sitaGLIPtin (JANUVIA) 100 MG tablet Take 100 mg by mouth 2 (two) times daily.      Marland Kitchen ibuprofen (ADVIL,MOTRIN) 200 MG tablet Take 400 mg by mouth every 6 (six) hours as needed. For pain        Results for orders placed during the hospital encounter of 09/20/12 (from the past 48 hour(s))  SURGICAL PCR SCREEN     Status: Abnormal   Collection Time   09/20/12  4:28 PM      Component Value Range Comment   MRSA, PCR POSITIVE (*) NEGATIVE    Staphylococcus aureus POSITIVE (*) NEGATIVE    No results found.  Review of Systems  All other systems reviewed and are negative.    Blood pressure 167/109, pulse 95, temperature 98.3 F (36.8 C), temperature source Oral, resp. rate 20, SpO2 100.00%. Physical Exam  On examination patient has a palpable dorsalis pedis and posterior tibial pulse he has extensive necrotic abscess tissue with osteomyelitis of the forefoot left foot. 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  Plan: Discussed that we will attempt foot salvage surgery with a midfoot amputation. Discussed the patient may  require a transtibial amputation do to the extensive soft tissue destruction.  Sharlette Jansma V 09/22/2012, 11:04 AM

## 2012-09-22 NOTE — Transfer of Care (Signed)
Immediate Anesthesia Transfer of Care Note  Patient: Adrian Khan  Procedure(s) Performed: Procedure(s) (LRB) with comments: AMPUTATION FOOT (Left) - Left Midfoot Amputation  Patient Location: PACU  Anesthesia Type:General  Level of Consciousness: awake, oriented and patient cooperative  Airway & Oxygen Therapy: Patient Spontanous Breathing and Patient connected to face mask oxygen  Post-op Assessment: Report given to PACU RN and Post -op Vital signs reviewed and stable  Post vital signs: Reviewed and stable  Complications: No apparent anesthesia complications

## 2012-09-22 NOTE — Progress Notes (Signed)
ANTICOAGULATION & ANTIBIOTICS CONSULT NOTE - Initial Consult  Pharmacy Consult for Coumadin & Vancomycin Indication: VTE prophylaxis & antibiotic prophylaxis s/p left foot amputation  Allergies  Allergen Reactions  . Sulfonamide Derivatives Other (See Comments)    cramps    Patient Measurements: Weight: 184.3 kg on 09/21/12 Height: 180 cm on 09/21/12 Vital Signs: Temp: 97.3 F (36.3 C) (12/06 1516) Temp src: Oral (12/06 1516) BP: 122/60 mmHg (12/06 1516) Pulse Rate: 86  (12/06 1516) Labs: No results found for this basename: HGB:2,HCT:3,PLT:3,APTT:3,LABPROT:3,INR:3,HEPARINUNFRC:3,CREATININE:3,CKTOTAL:3,CKMB:3,TROPONINI:3 in the last 72 hours  The CrCl is unknown because both a height and weight (above a minimum accepted value) are required for this calculation.   Medical History: Past Medical History  Diagnosis Date  . Shortness of breath     with exertion  . PONV (postoperative nausea and vomiting)   . Diabetes mellitus without complication   . Anxiety   . Mental disorder   . Depression   . Sleep apnea     done at [facility].  Marland Kitchen Pneumonia   . GERD (gastroesophageal reflux disease)   . Constipation   . Neuropathy     feet, hands  . Anginal pain     chest pressure has increased in the past few days; patient denied  on 09/20/12    Medications:  Anti-infectives     Start     Dose/Rate Route Frequency Ordered Stop   09/22/12 1600   vancomycin (VANCOCIN) IVPB 1000 mg/200 mL premix  Status:  Discontinued        1,000 mg 200 mL/hr over 60 Minutes Intravenous Every 12 hours 09/22/12 1526 09/22/12 1540   09/21/12 1339   ceFAZolin (ANCEF) 3 g in dextrose 5 % 50 mL IVPB        3 g 160 mL/hr over 30 Minutes Intravenous 60 min pre-op 09/21/12 1339 09/22/12 1225          Assessment: 36 YO obese male with DM and s/p foot amputation to start IV Vancomycin per pharmacy dosing x3 days post-op and Coumadin for VTE ppx. Warfarin score of 9. Plans to d/c home on Monday.    WBC- 16.9  post op. H/H 9.4/28.6, Plts 382.  INR- 1.02 (note that INR was elevated in March but patient does not report Coumadin recently).  SCr- 1.29 (stable with SCrs reported in March of this year)  Goal of Therapy:  INR 2-3 Vancomycin level 15-20   Plan:  1. Vancomycin 1500mg  IV q12h x3 days.  2. Coumadin 10mg  po x1 at 1800.  3. Daily PT/INR while on therapy. 4. Coumadin book and video.   Fayne Norrie 09/22/2012,3:27 PM

## 2012-09-23 LAB — GLUCOSE, CAPILLARY
Glucose-Capillary: 282 mg/dL — ABNORMAL HIGH (ref 70–99)
Glucose-Capillary: 256 mg/dL — ABNORMAL HIGH (ref 70–99)

## 2012-09-23 MED ORDER — ASPIRIN 325 MG PO TABS
325.0000 mg | ORAL_TABLET | Freq: Two times a day (BID) | ORAL | Status: DC
  Administered 2012-09-23 – 2012-09-25 (×5): 325 mg via ORAL
  Filled 2012-09-23 (×8): qty 1

## 2012-09-23 MED ORDER — INSULIN ASPART 100 UNIT/ML ~~LOC~~ SOLN
0.0000 [IU] | Freq: Three times a day (TID) | SUBCUTANEOUS | Status: DC
  Administered 2012-09-24 (×2): 5 [IU] via SUBCUTANEOUS
  Administered 2012-09-24: 3 [IU] via SUBCUTANEOUS
  Administered 2012-09-25 (×2): 5 [IU] via SUBCUTANEOUS
  Filled 2012-09-23: qty 3
  Filled 2012-09-23: qty 0.15

## 2012-09-23 NOTE — Progress Notes (Signed)
Pt refused for labs to be drawn this AM stating that he was under the impression no more blood would be drawn while he is here. Informed him of the reasons for lab work and he still refused. Will notify MD.

## 2012-09-23 NOTE — Progress Notes (Signed)
Subjective: Pt stable - pain controlled - pt not really interested in mobilization   Objective: Vital signs in last 24 hours: Temp:  [97 F (36.1 C)-98.7 F (37.1 C)] 98.7 F (37.1 C) (12/07 0715) Pulse Rate:  [82-100] 90  (12/07 0715) Resp:  [4-27] 20  (12/07 0715) BP: (88-167)/(55-109) 125/62 mmHg (12/07 0715) SpO2:  [95 %-100 %] 100 % (12/07 0715) Weight:  [183.4 kg (404 lb 5.2 oz)] 183.4 kg (404 lb 5.2 oz) (12/07 0110)  Intake/Output from previous day: 12/06 0701 - 12/07 0700 In: 1780 [P.O.:920; I.V.:860] Out: 600 [Urine:500; Blood:100] Intake/Output this shift: Total I/O In: 600 [P.O.:600] Out: -   Exam:  dressing ok on foot - no bleeding  Labs:  [facility] 09/22/12 1657  HGB 9.4*    Basename 09/22/12 1657  WBC 16.9*  RBC 3.44*  HCT 28.6*  PLT 382    Basename 09/22/12 1657  NA --  K --  CL --  CO2 --  BUN --  CREATININE 1.29  GLUCOSE --  CALCIUM --    Basename 09/22/12 1657  LABPT --  INR 1.02    Assessment/Plan: NWB affected extremity - mobilize with PT for transfers   [facility] SCOTT 09/23/2012, 9:07 AM

## 2012-09-23 NOTE — Evaluation (Signed)
Physical Therapy Evaluation Patient Details Name: Adrian Khan MRN: [id number] DOB: 1975/11/23 Today's Date: 09/23/2012 Time: 4098-1191 PT Time Calculation (min): 23 min  PT Assessment / Plan / Recommendation Clinical Impression  Pt is a 36yo males known to hospital and PT presenting with transmetarsal amputation and morbid obesity. patient now L LE NWB. Patient refuses to attempt to stand on R LE due to risk of R foot progressing like L LE. Strongly encouraged patient to complete lateral transfer however patient does not want to "mess up this hand" in reference to the IV site due to his phobia of needles and risking them having to put in another IV. Patient mod I at home PTA however was able to put weight through L LE. Due to patient size he will be unable to enter home due to 3 STEs and a w/c not being able to fit through the door. Patient and mother reports 3 people form EMS had to carry patient in on stretcher. Patient refuses to entertain SNF because "I'm not planning on walking for a long time b/c I don't want to loose my feet." Patient also reports he is unable to complete w/c mobility in home b/c his house is too small. Patient morbid obesity and L LE NWB condition is greatly limiting patient OOB mobility and ability to complete OOB mobilty at home.Pt not motivated to perform OOB mobility due to increased fear of future condition of bilat feet. Patient to benefit from ST-SNF to receive maximal PT to improved mobility at home however patient refuses SNF. HHPT to benefit to improve safety with OOB transfers to w/c and [facility] with his room. Patient with limited OOB mobility at home due to inability to use w/c t/o house.    PT Assessment  Patient needs continued PT services    Follow Up Recommendations  Home health PT;Supervision/Assistance - 24 hour (vs SNF)    Does the patient have the potential to tolerate intense rehabilitation      Barriers to Discharge Inaccessible home  environment pt unable to manage 3 STE without use of EMS    Equipment Recommendations       Recommendations for Other Services     Frequency Min 5X/week    Precautions / Restrictions Precautions Precautions: Fall Required Braces or Orthoses:  (L post op shoe) Restrictions Weight Bearing Restrictions: Yes LLE Weight Bearing: Non weight bearing   Pertinent Vitals/Pain L LE throbbing in dependent position      Mobility  Bed Mobility Bed Mobility: Supine to Sit;Sit to Supine Supine to Sit: 6: Modified independent (Device/Increase time);With rails Sit to Supine: 6: Modified independent (Device/Increase time);With rail Transfers Transfers: Not assessed Ambulation/Gait Ambulation/Gait Assistance: Not tested (comment) Wheelchair Mobility Wheelchair Mobility: No    Shoulder Instructions     Exercises     PT Diagnosis: Difficulty walking  PT Problem List: Decreased activity tolerance;Decreased balance PT Treatment Interventions: Therapeutic activities;Functional mobility training   PT Goals Acute Rehab PT Goals PT Goal Formulation: With patient Time For Goal Achievement: 09/30/12 Potential to Achieve Goals: Fair Pt will go Sit to Stand: with mod assist;with upper extremity assist (up to RW with L LE NWB.) PT Goal: Sit to Stand - Progress: Goal set today Pt will Transfer Bed to Chair/Chair to Bed: with min assist (via lateral transfer) PT Transfer Goal: Bed to Chair/Chair to Bed - Progress: Goal set today Pt will Perform Home Exercise Program: Independently PT Goal: Perform Home Exercise Program - Progress: Goal set today  Visit  Information  Last PT Received On: 09/23/12 Assistance Needed: +2    Subjective Data  Subjective: Pt received supine in bed with c/o "My stomach is still upset. I'm nauseous." I'm not getting OOB right now." patient known to hospital/PT. Patient with phobia of needles and is very specific to his techniques for mobility.   Prior Functioning  Home  Living Lives With: Family Available Help at Discharge: Family;Available 24 hours/day Type of Home: House Home Access: Stairs to enter [employer] of Steps: 3 Home Layout: Two level;Able to live on main level with bedroom/bathroom Bathroom Shower/Tub:  (pt sponge baths in bed) Bathroom Toilet:  (pt uses BSC) Bathroom Accessibility: No Home Adaptive Equipment: Bedside commode/3-in-1 (pt's mother reports "I'm getting a hospital bed.") Prior Function Level of Independence: Needs assistance Needs Assistance:  (pt set up for bathing/food) Able to Take Stairs?: No Driving: No Vocation: Unemployed Communication Communication: No difficulties Dominant Hand: Right    Cognition  Overall Cognitive Status: Appears within functional limits for tasks assessed/performed Arousal/Alertness: Awake/alert Orientation Level: Oriented X4 / Intact Behavior During Session: [facility] for tasks performed    Extremity/Trunk Assessment Right Upper Extremity Assessment RUE ROM/Strength/Tone: Within functional levels Left Upper Extremity Assessment LUE ROM/Strength/Tone: Within functional levels Right Lower Extremity Assessment RLE ROM/Strength/Tone: Within functional levels Left Lower Extremity Assessment LLE ROM/Strength/Tone:  (ROM wfl, except ankle-limited by wound/surgery/dressing) Trunk Assessment Trunk Assessment: Normal   Balance Balance Balance Assessed: Yes Static Sitting Balance Static Sitting - Balance Support: No upper extremity supported Static Sitting - Level of Assistance: 7: Independent Static Sitting - Comment/# of Minutes: 15  End of Session PT - End of Session Activity Tolerance: Patient limited by pain (and nausea vomitting) Patient left: in bed;with call bell/phone within reach;with family/visitor present Nurse Communication: Mobility status;Weight bearing status  GP     Marcene Brawn 09/23/2012, 10:08 AM   [name], PT, DPT Pager #: [phone] Office #:  [phone]

## 2012-09-24 LAB — GLUCOSE, CAPILLARY
Glucose-Capillary: 191 mg/dL — ABNORMAL HIGH (ref 70–99)
Glucose-Capillary: 222 mg/dL — ABNORMAL HIGH (ref 70–99)

## 2012-09-24 MED ORDER — CHLORHEXIDINE GLUCONATE CLOTH 2 % EX PADS
6.0000 | MEDICATED_PAD | Freq: Every day | CUTANEOUS | Status: DC
  Administered 2012-09-25: 6 via TOPICAL

## 2012-09-24 MED ORDER — DOCUSATE SODIUM 100 MG PO CAPS
100.0000 mg | ORAL_CAPSULE | Freq: Two times a day (BID) | ORAL | Status: DC
  Administered 2012-09-24 – 2012-09-25 (×3): 100 mg via ORAL
  Filled 2012-09-24 (×2): qty 1

## 2012-09-24 MED ORDER — MUPIROCIN 2 % EX OINT
1.0000 "application " | TOPICAL_OINTMENT | Freq: Two times a day (BID) | CUTANEOUS | Status: DC
  Administered 2012-09-24 – 2012-09-25 (×2): 1 via NASAL
  Filled 2012-09-24: qty 22

## 2012-09-24 NOTE — Progress Notes (Signed)
Physical Therapy Treatment Patient Details Name: Adrian Khan MRN: [id number] DOB: August 01, 1976 Today's Date: 09/24/2012 Time: 1135-1150 PT Time Calculation (min): 15 min  PT Assessment / Plan / Recommendation Comments on Treatment Session  Pt safe with lateral transfers from surface to surface with needed assitance only to stabilize the chair/surface being transfered to due to his size and that he pulls on the surface. Pt adamant he will not stand and try that type of transfer due to fear of hurting his left foot.    Follow Up Recommendations  Home health PT;Supervision/Assistance - 24 hour                 Frequency Min 5X/week   Plan Discharge plan remains appropriate;Frequency remains appropriate    Precautions / Restrictions Precautions Precautions: Fall Restrictions LLE Weight Bearing: Non weight bearing       Mobility  Bed Mobility Supine to Sit: 6: Modified independent (Device/Increase time);With rails;HOB elevated (HOB 30 degrees) Transfers Transfers: Lateral/Scoot Transfers Lateral/Scoot Transfers: 5: Supervision Details for Transfer Assistance: supervision with assist to asure recliner did not move while pt scooted laterally from bed to chair. attempted to provide cues to pt on ways to make it easier, pt nonreceptive and did it his own way. Increased time required, however pt was safe and followed NWB on lt let.        PT Goals Acute Rehab PT Goals PT Transfer Goal: Bed to Chair/Chair to Bed - Progress: Met  Visit Information  Last PT Received On: 09/24/12 Assistance Needed: +1    Subjective Data  Subjective: Pt with multiple complaints (nausea, pain, not being able to go to bathroom, fearful of pulling out IV). Assured pt we would not pull out his IV and that mobilty will help with some of his other issues (nause and not being able to have a BM). Pt reluctantly agreed to get out of bed.   Cognition  Overall Cognitive Status: Appears within functional  limits for tasks assessed/performed Arousal/Alertness: Awake/alert Orientation Level: Appears intact for tasks assessed Behavior During Session: [facility] for tasks performed       End of Session PT - End of Session Equipment Utilized During Treatment: Gait belt Activity Tolerance: Patient tolerated treatment well Patient left: in chair;with call bell/phone within reach;with family/visitor present Nurse Communication: Mobility status;Patient requests pain meds;Weight bearing status   GP     Sallyanne Kuster 09/24/2012, 12:30 PM  Sallyanne Kuster, PTA Office- [phone]

## 2012-09-24 NOTE — Progress Notes (Signed)
Utilization Review Completed.Adrian Khan T1/28/2013   

## 2012-09-24 NOTE — Progress Notes (Signed)
Subjective: Pt stable requests stool softener   Objective: Vital signs in last 24 hours: Temp:  [97.4 F (36.3 C)-97.5 F (36.4 C)] 97.5 F (36.4 C) (12/07 2314) Pulse Rate:  [93-94] 94  (12/07 2314) Resp:  [18-20] 18  (12/07 2314) BP: (125-145)/(66-69) 125/66 mmHg (12/07 2314) SpO2:  [100 %] 100 % (12/07 2314)  Intake/Output from previous day: 12/07 0701 - 12/08 0700 In: 2580 [P.O.:1880; I.V.:200; IV Piggyback:500] Out: 1200 [Urine:1200] Intake/Output this shift:    Exam:  dressing dry  Labs:  Basename 09/22/12 1657  HGB 9.4*    Basename 09/22/12 1657  WBC 16.9*  RBC 3.44*  HCT 28.6*  PLT 382    Basename 09/22/12 1657  NA --  K --  CL --  CO2 --  BUN --  CREATININE 1.29  GLUCOSE --  CALCIUM --    Basename 09/22/12 1657  LABPT --  INR 1.02    Assessment/Plan: Will be difficult for pt to mobilize at home with current wb restrictions - dressing ok - dc this week if hhc and access issues resolved   Karole Oo SCOTT 09/24/2012, 10:24 AM

## 2012-09-24 NOTE — Progress Notes (Signed)
Pt has been refusing to take insulin coverage because of his fear of needles. Pharmacy has obtained an insulin pen and pt has agreed to allow coverage with it.

## 2012-09-25 ENCOUNTER — Encounter (HOSPITAL_COMMUNITY): Payer: Self-pay | Admitting: Orthopedic Surgery

## 2012-09-25 LAB — GLUCOSE, CAPILLARY: Glucose-Capillary: 230 mg/dL — ABNORMAL HIGH (ref 70–99)

## 2012-09-25 MED ORDER — HYDROCODONE-ACETAMINOPHEN 5-500 MG PO TABS: 1.0000 | ORAL_TABLET | Freq: Four times a day (QID) | ORAL | Status: DC | PRN

## 2012-09-25 MED FILL — Insulin Aspart Inj 100 Unit/ML: SUBCUTANEOUS | Qty: 0.05 | Status: AC

## 2012-09-25 NOTE — Progress Notes (Addendum)
Care Management Note 09/25/12  CM spoke with patient concerning home health and DME needs at discharge. Patient has been a former patient of [facility] and wants to do so now. Home Health Physical therapy arranged.Marland Kitchen Hospital bed ordered. Patient will discharge Home via ambulance.  Vance Peper, RN BSN Care Management

## 2012-09-25 NOTE — Discharge Summary (Signed)
Physician Discharge Summary  Patient ID: Adrian Khan MRN: [id number] DOB/AGE: 02-14-1976 36 y.o.  Admit date: 09/22/2012 Discharge date: 09/25/2012  Admission Diagnoses: Osteomyelitis abscess left forefoot  Discharge Diagnoses: Same Active Problems:  * No active hospital problems. *    Discharged Condition: stable  Hospital Course: Patient's hospital course was essentially unremarkable. He underwent a left midfoot amputation. Postoperatively patient progressed slowly with therapy and was discharged to home in stable condition with home health therapy.  Consults: None  Significant Diagnostic Studies: labs: Routine labs  Treatments: surgery: See operative note  Discharge Exam: Blood pressure 155/74, pulse 88, temperature 98.1 F (36.7 C), temperature source Oral, resp. rate 20, height 5' 10.87" (1.8 m), weight 183.4 kg (404 lb 5.2 oz), SpO2 99.00%. Incision/Wound: dressing clean dry and intact  Disposition: 06-Home-Health Care Svc  Discharge Orders    Future Appointments: Provider: Department: Dept Phone: Center:   09/29/2012 2:00 PM [facility] [place] [facility] [address] [phone] None     Future Orders Please Complete By Expires   Ambulatory referral to Home Health      Comments:   Please evaluate Adrian Khan for admission to [facility].  Disciplines requested: Physical Therapy and Home Health Aide  Services to provide: Strengthening Exercises and Other: Patient will require a hospital bed. Patient will require medical transportation to home to allow him to navigate 3 steps to get into the house.  Physician to follow patient's care (the person listed here will be responsible for signing ongoing orders): Referring Provider  Requested Start of Care Date: Today (Please call ahead to confirm availability [phone])  Special Instructions:  Patient will require medical transportation to be discharged to home.   Diet - low sodium  heart healthy      Call MD / Call 911      Comments:   If you experience chest pain or shortness of breath, CALL 911 and be transported to the hospital emergency room.  If you develope a fever above 101 F, pus (white drainage) or increased drainage or redness at the wound, or calf pain, call your surgeon's office.   Constipation Prevention      Comments:   Drink plenty of fluids.  Prune juice may be helpful.  You may use a stool softener, such as Colace (over the counter) 100 mg twice a day.  Use MiraLax (over the counter) for constipation as needed.   Increase activity slowly as tolerated          Medication List     As of 09/25/2012  6:50 AM    TAKE these medications         doxycycline 100 MG DR capsule   Commonly known as: DORYX   Take 100 mg by mouth 2 (two) times daily.      HYDROcodone-acetaminophen 5-500 MG per tablet   Commonly known as: VICODIN   Take 1 tablet by mouth every 6 (six) hours as needed for pain.      ibuprofen 200 MG tablet   Commonly known as: ADVIL,MOTRIN   Take 400 mg by mouth every 6 (six) hours as needed. For pain      sitaGLIPtin 100 MG tablet   Commonly known as: JANUVIA   Take 100 mg by mouth 2 (two) times daily.           Follow-up Information    Follow up with Raedyn Wenke V, MD. In 1 week.   Contact information:   [address] [place] [address] [phone]          Signed: Nadara Mustard 09/25/2012, 6:50 AM

## 2012-09-25 NOTE — Progress Notes (Signed)
PT Cancellation Note  Patient Details Name: Adrian Khan MRN: [id number] DOB: 08-Nov-1975   Cancelled Treatment:    Reason Eval/Treat Not Completed: Other (comment) Patient stated he felt nauseated. Attempted to get him to practice transfers prior to DC home. Patient stated that he was able to get himself to the chair by himself and did note that he was at supervision with lateral transfer last PT session. Patient states that he will have assistance as needed at home and that he was told he will have equipment set up and have a ambulance transfer home as he is not willing to attempt standing at this time. Will follow up with case management regarding above.    Fredrich Birks 09/25/2012, 9:30 AM

## 2012-09-27 ENCOUNTER — Other Ambulatory Visit (HOSPITAL_COMMUNITY): Payer: Self-pay | Admitting: General Surgery

## 2012-09-27 DIAGNOSIS — T148XXA Other injury of unspecified body region, initial encounter: Secondary | ICD-10-CM

## 2012-09-29 ENCOUNTER — Encounter (HOSPITAL_COMMUNITY): Payer: PRIVATE HEALTH INSURANCE

## 2012-10-10 ENCOUNTER — Encounter (HOSPITAL_BASED_OUTPATIENT_CLINIC_OR_DEPARTMENT_OTHER): Payer: PRIVATE HEALTH INSURANCE

## 2012-10-16 NOTE — Progress Notes (Signed)
[facility] ED CM spoke with pt's caregiver DEMETRIS CAPELL who is being admitted for pneumonia) in [facility] ED. [facility] to have pt seen in her absence for dressing changes until she returns home. CM spoke with Rosey Bath at [facility] [phone] to have [facility] staff to contact pt's MD to have this re arranged Rosey Bath stated [facility] Case manager will complete this task.

## 2013-02-22 MED ORDER — MUPIROCIN 2 % EX OINT: TOPICAL_OINTMENT | Freq: Once | CUTANEOUS | Status: DC

## 2013-02-23 ENCOUNTER — Inpatient Hospital Stay (HOSPITAL_COMMUNITY)
Admission: RE | Admit: 2013-02-23 | Payer: PRIVATE HEALTH INSURANCE | Source: Ambulatory Visit | Admitting: Orthopedic Surgery

## 2013-02-23 ENCOUNTER — Encounter (HOSPITAL_COMMUNITY): Admission: RE | Payer: Self-pay | Source: Ambulatory Visit

## 2013-02-23 SURGERY — AMPUTATION BELOW KNEE
Anesthesia: General | Site: Leg Lower | Laterality: Left

## 2013-07-18 ENCOUNTER — Emergency Department (HOSPITAL_COMMUNITY): Payer: PRIVATE HEALTH INSURANCE

## 2013-07-18 ENCOUNTER — Emergency Department (HOSPITAL_COMMUNITY)
Admission: EM | Admit: 2013-07-18 | Discharge: 2013-07-18 | Disposition: A | Discharge status: Home / Self Care | Payer: PRIVATE HEALTH INSURANCE | Attending: Emergency Medicine | Admitting: Emergency Medicine

## 2013-07-18 ENCOUNTER — Encounter (HOSPITAL_COMMUNITY): Payer: Self-pay | Admitting: *Deleted

## 2013-07-18 DIAGNOSIS — Z8701 Personal history of pneumonia (recurrent): Secondary | ICD-10-CM | POA: Insufficient documentation

## 2013-07-18 DIAGNOSIS — Z8719 Personal history of other diseases of the digestive system: Secondary | ICD-10-CM | POA: Insufficient documentation

## 2013-07-18 DIAGNOSIS — Z79899 Other long term (current) drug therapy: Secondary | ICD-10-CM | POA: Insufficient documentation

## 2013-07-18 DIAGNOSIS — Z8669 Personal history of other diseases of the nervous system and sense organs: Secondary | ICD-10-CM | POA: Insufficient documentation

## 2013-07-18 DIAGNOSIS — E119 Type 2 diabetes mellitus without complications: Secondary | ICD-10-CM | POA: Insufficient documentation

## 2013-07-18 DIAGNOSIS — M25512 Pain in left shoulder: Secondary | ICD-10-CM

## 2013-07-18 DIAGNOSIS — Z8659 Personal history of other mental and behavioral disorders: Secondary | ICD-10-CM | POA: Insufficient documentation

## 2013-07-18 DIAGNOSIS — M25519 Pain in unspecified shoulder: Secondary | ICD-10-CM | POA: Insufficient documentation

## 2013-07-18 DIAGNOSIS — Z8679 Personal history of other diseases of the circulatory system: Secondary | ICD-10-CM | POA: Insufficient documentation

## 2013-07-18 MED ORDER — OXYCODONE-ACETAMINOPHEN 5-325 MG PO TABS: 2.0000 | ORAL_TABLET | ORAL | Status: DC | PRN

## 2013-07-18 MED ORDER — OXYCODONE-ACETAMINOPHEN 5-325 MG PO TABS
2.0000 | ORAL_TABLET | Freq: Once | ORAL | Status: AC
  Administered 2013-07-18: 2 via ORAL
  Filled 2013-07-18: qty 2

## 2013-07-18 MED ORDER — MORPHINE SULFATE 4 MG/ML IJ SOLN: 4.0000 mg | Freq: Once | INTRAMUSCULAR | Status: DC

## 2013-07-18 MED ORDER — HYDROMORPHONE HCL PF 2 MG/ML IJ SOLN
2.0000 mg | Freq: Once | INTRAMUSCULAR | Status: AC
  Administered 2013-07-18: 2 mg via INTRAMUSCULAR
  Filled 2013-07-18: qty 1

## 2013-07-18 NOTE — ED Provider Notes (Signed)
CSN: [id number]     Arrival date & time 07/18/13  0146 History   First MD Initiated Contact with Patient 07/18/13 0228     Chief Complaint  Patient presents with  . Shoulder Pain   (Consider location/radiation/quality/duration/timing/severity/associated sxs/prior Treatment) HPI Comments: Patient is a 37 year old male with history of morbid obesity, diabetes. He presents with complaints of left shoulder pain that woke him from sleep one week ago. It has been present since that time. Is worse with movement. He states that his left hand feels numb. He denies there has been any injury or trauma.  Patient is a 37 y.o. male presenting with shoulder pain. The history is provided by the patient.  Shoulder Pain This is a new problem. Episode onset: One week ago. The problem occurs constantly. The problem has been gradually worsening. Pertinent negatives include no chest pain. Exacerbated by: Movement and palpation. Nothing relieves the symptoms. He has tried nothing for the symptoms. The treatment provided no relief.    Past Medical History  Diagnosis Date  . Shortness of breath     with exertion  . PONV (postoperative nausea and vomiting)   . Diabetes mellitus without complication   . Anxiety   . Mental disorder   . Depression   . Sleep apnea     done at [facility].  [facility]land Kitchen Pneumonia   . GERD (gastroesophageal reflux disease)   . Constipation   . Neuropathy     feet, hands  . Anginal pain     chest pressure has increased in the past few days; patient denied  on 09/20/12   Past Surgical History  Procedure Laterality Date  . I&d extremity  12/22/2011    Procedure: IRRIGATION AND DEBRIDEMENT EXTREMITY;  Surgeon: Kennieth Rad, MD;  Location: [facility] OR;  Service: Orthopedics;  Laterality: Left;  . I&d extremity  12/24/2011    Procedure: IRRIGATION AND DEBRIDEMENT EXTREMITY;  Surgeon: Kennieth Rad, MD;  Location: [facility] OR;  Service: Orthopedics;  Laterality: Left;  Irrigation and debridement left foot.  .  I&d extremity  12/26/2011    Procedure: IRRIGATION AND DEBRIDEMENT EXTREMITY;  Surgeon: Kennieth Rad, MD;  Location: [facility] OR;  Service: Orthopedics;  Laterality: Left;  . I&d extremity  12/28/2011    Procedure: IRRIGATION AND DEBRIDEMENT EXTREMITY;  Surgeon: Kennieth Rad, MD;  Location: [facility] OR;  Service: Orthopedics;  Laterality: Left;  I&D Left Foot;Application of  Wound Vac.to dorsal and plantar areas of left foot  . Eye surgery    . Toe surgery      Bone removed- 2 toe right  . Wisdom tooth extraction    . Amputation  09/22/2012    Procedure: AMPUTATION FOOT;  Surgeon: Nadara Mustard, MD;  Location: [facility] OR;  Service: Orthopedics;  Laterality: Left;  Left Midfoot Amputation   History reviewed. No pertinent family history. History  Substance Use Topics  . Smoking status: Never Smoker   . Smokeless tobacco: Never Used  . Alcohol Use: No    Review of Systems  Cardiovascular: Negative for chest pain.  All other systems reviewed and are negative.    Allergies  Sulfonamide derivatives  Home Medications   Current Outpatient Rx  Name  Route  Sig  Dispense  Refill  . ibuprofen (ADVIL,MOTRIN) 200 MG tablet   Oral   Take 400 mg by mouth every 6 (six) hours as needed. For pain         . oxyCODONE-acetaminophen (PERCOCET/ROXICET) 5-325 MG per tablet  Oral   Take 1 tablet by mouth every 4 (four) hours as needed for pain.         . sitaGLIPtin (JANUVIA) 100 MG tablet   Oral   Take 100 mg by mouth 2 (two) times daily.          BP 129/88  Pulse 102  Temp(Src) 98.2 F (36.8 C) (Oral)  Resp 24  SpO2 99% Physical Exam  Nursing note and vitals reviewed. Constitutional: He is oriented to person, place, and time. He appears well-developed and well-nourished. No distress.  HENT:  Head: Normocephalic and atraumatic.  Mouth/Throat: Oropharynx is clear and moist.  Neck: Normal range of motion. Neck supple.  Musculoskeletal: Normal range of motion.  The left shoulder appears  grossly normal, however the exam is somewhat limited due to body habitus. There is tenderness to palpation over the anterior shoulder and pain with any range of motion. The ulnar and radial pulses are easily palpable. Motor and sensation are intact in the left hand and elbow.  Neurological: He is alert and oriented to person, place, and time.  Skin: Skin is warm and dry. He is not diaphoretic.    ED Course  Procedures (including critical care time) Labs Review Labs Reviewed - No data to display Imaging Review No results found.  MDM  No diagnosis found. Patient presents here with complaints of left shoulder pain that woke him up from sleep one week ago.  He denies any injury or trauma.  Initial xrays in the ED revealed a possible posterior dislocation.  I spoke with Dr. Rennis Chris from orthopedics who recommended a ct scan to confirm the dislocation and determine if there were any bony lesions due to the prolonged time out of the socket.  This was performed and showed that the shoulder was not actually dislocated.  At this point, I feel as though a sling, pain meds, and time is the appropriate course of action.  If he does not improve in the next week, he is to follow up with orthopedics.      Geoffery Lyons, MD 07/18/13 [phone]

## 2013-07-18 NOTE — ED Notes (Signed)
Ortho at bedside.

## 2013-07-18 NOTE — ED Notes (Signed)
Spoke with Ortho, will attempt to fit arm sling for pt.

## 2013-07-18 NOTE — ED Notes (Signed)
IV team at bedside 

## 2013-07-18 NOTE — ED Notes (Signed)
[facility] at bedside to transport pt home 

## 2013-07-18 NOTE — Progress Notes (Signed)
Orthopedic Tech Progress Note Patient Details:  Adrian Khan 1976-06-19 [id number]  Ortho Devices Type of Ortho Device: Arm sling Ortho Device/Splint Interventions: Application   Cammer, Mickie Bail 07/18/2013, 8:20 AM

## 2013-07-18 NOTE — ED Notes (Signed)
IV team unable to successful get IV, EDP aware

## 2013-07-18 NOTE — ED Notes (Addendum)
[facility] called to transport pt back home. Family aware. Ortho paged for sling options.

## 2013-07-18 NOTE — ED Notes (Signed)
Per EMS: pt complaining of shoulder pain. Pt states that he hurt his shoulder while sleeping.

## 2013-07-18 NOTE — ED Notes (Signed)
Pt states that his left shoulder has actually been in pain lasting for a week. Pt states no known cause for the pain. Pt states left shoulder cannot be moved without pain. Pt applied 2 icey hot patches to her left shoulder. Pt can wiggle fingers and CNS intact distal to injury.

## 2013-07-24 ENCOUNTER — Encounter (HOSPITAL_COMMUNITY): Payer: Self-pay | Admitting: Emergency Medicine

## 2013-07-24 ENCOUNTER — Emergency Department (HOSPITAL_COMMUNITY): Payer: PRIVATE HEALTH INSURANCE

## 2013-07-24 ENCOUNTER — Inpatient Hospital Stay (HOSPITAL_COMMUNITY)
Admission: EM | Admit: 2013-07-24 | Discharge: 2013-08-02 | DRG: 872 | Disposition: A | Discharge status: Home Health | Payer: PRIVATE HEALTH INSURANCE | Attending: Internal Medicine | Admitting: Internal Medicine

## 2013-07-24 DIAGNOSIS — N179 Acute kidney failure, unspecified: Secondary | ICD-10-CM

## 2013-07-24 DIAGNOSIS — IMO0002 Reserved for concepts with insufficient information to code with codable children: Secondary | ICD-10-CM | POA: Diagnosis present

## 2013-07-24 DIAGNOSIS — M9080 Osteopathy in diseases classified elsewhere, unspecified site: Secondary | ICD-10-CM | POA: Diagnosis present

## 2013-07-24 DIAGNOSIS — N183 Chronic kidney disease, stage 3 unspecified: Secondary | ICD-10-CM

## 2013-07-24 DIAGNOSIS — Z6841 Body Mass Index (BMI) 40.0 and over, adult: Secondary | ICD-10-CM

## 2013-07-24 DIAGNOSIS — R7881 Bacteremia: Secondary | ICD-10-CM

## 2013-07-24 DIAGNOSIS — M109 Gout, unspecified: Secondary | ICD-10-CM | POA: Diagnosis present

## 2013-07-24 DIAGNOSIS — E1129 Type 2 diabetes mellitus with other diabetic kidney complication: Secondary | ICD-10-CM | POA: Diagnosis present

## 2013-07-24 DIAGNOSIS — Z113 Encounter for screening for infections with a predominantly sexual mode of transmission: Secondary | ICD-10-CM

## 2013-07-24 DIAGNOSIS — A419 Sepsis, unspecified organism: Secondary | ICD-10-CM

## 2013-07-24 DIAGNOSIS — E875 Hyperkalemia: Secondary | ICD-10-CM | POA: Diagnosis present

## 2013-07-24 DIAGNOSIS — J309 Allergic rhinitis, unspecified: Secondary | ICD-10-CM

## 2013-07-24 DIAGNOSIS — M549 Dorsalgia, unspecified: Secondary | ICD-10-CM

## 2013-07-24 DIAGNOSIS — R079 Chest pain, unspecified: Secondary | ICD-10-CM

## 2013-07-24 DIAGNOSIS — M25519 Pain in unspecified shoulder: Secondary | ICD-10-CM | POA: Diagnosis present

## 2013-07-24 DIAGNOSIS — D638 Anemia in other chronic diseases classified elsewhere: Secondary | ICD-10-CM

## 2013-07-24 DIAGNOSIS — G4730 Sleep apnea, unspecified: Secondary | ICD-10-CM

## 2013-07-24 DIAGNOSIS — E871 Hypo-osmolality and hyponatremia: Secondary | ICD-10-CM

## 2013-07-24 DIAGNOSIS — E1165 Type 2 diabetes mellitus with hyperglycemia: Secondary | ICD-10-CM | POA: Diagnosis present

## 2013-07-24 DIAGNOSIS — D72829 Elevated white blood cell count, unspecified: Secondary | ICD-10-CM

## 2013-07-24 DIAGNOSIS — S98919A Complete traumatic amputation of unspecified foot, level unspecified, initial encounter: Secondary | ICD-10-CM

## 2013-07-24 DIAGNOSIS — E11621 Type 2 diabetes mellitus with foot ulcer: Secondary | ICD-10-CM

## 2013-07-24 DIAGNOSIS — K219 Gastro-esophageal reflux disease without esophagitis: Secondary | ICD-10-CM | POA: Diagnosis present

## 2013-07-24 DIAGNOSIS — M2540 Effusion, unspecified joint: Secondary | ICD-10-CM | POA: Diagnosis present

## 2013-07-24 DIAGNOSIS — L02619 Cutaneous abscess of unspecified foot: Secondary | ICD-10-CM

## 2013-07-24 DIAGNOSIS — A409 Streptococcal sepsis, unspecified: Principal | ICD-10-CM | POA: Diagnosis present

## 2013-07-24 DIAGNOSIS — J9621 Acute and chronic respiratory failure with hypoxia: Secondary | ICD-10-CM

## 2013-07-24 DIAGNOSIS — A491 Streptococcal infection, unspecified site: Secondary | ICD-10-CM

## 2013-07-24 DIAGNOSIS — D473 Essential (hemorrhagic) thrombocythemia: Secondary | ICD-10-CM

## 2013-07-24 DIAGNOSIS — G4733 Obstructive sleep apnea (adult) (pediatric): Secondary | ICD-10-CM

## 2013-07-24 DIAGNOSIS — L259 Unspecified contact dermatitis, unspecified cause: Secondary | ICD-10-CM | POA: Diagnosis present

## 2013-07-24 DIAGNOSIS — N189 Chronic kidney disease, unspecified: Secondary | ICD-10-CM | POA: Diagnosis present

## 2013-07-24 DIAGNOSIS — L97509 Non-pressure chronic ulcer of other part of unspecified foot with unspecified severity: Secondary | ICD-10-CM | POA: Diagnosis present

## 2013-07-24 DIAGNOSIS — Z9189 Other specified personal risk factors, not elsewhere classified: Secondary | ICD-10-CM

## 2013-07-24 DIAGNOSIS — E1169 Type 2 diabetes mellitus with other specified complication: Secondary | ICD-10-CM

## 2013-07-24 DIAGNOSIS — E119 Type 2 diabetes mellitus without complications: Secondary | ICD-10-CM

## 2013-07-24 DIAGNOSIS — F411 Generalized anxiety disorder: Secondary | ICD-10-CM

## 2013-07-24 DIAGNOSIS — M86172 Other acute osteomyelitis, left ankle and foot: Secondary | ICD-10-CM

## 2013-07-24 DIAGNOSIS — M25512 Pain in left shoulder: Secondary | ICD-10-CM

## 2013-07-24 DIAGNOSIS — R509 Fever, unspecified: Secondary | ICD-10-CM

## 2013-07-24 DIAGNOSIS — D75839 Thrombocytosis, unspecified: Secondary | ICD-10-CM

## 2013-07-24 DIAGNOSIS — I10 Essential (primary) hypertension: Secondary | ICD-10-CM

## 2013-07-24 DIAGNOSIS — M79602 Pain in left arm: Secondary | ICD-10-CM

## 2013-07-24 DIAGNOSIS — R6510 Systemic inflammatory response syndrome (SIRS) of non-infectious origin without acute organ dysfunction: Secondary | ICD-10-CM

## 2013-07-24 LAB — HEPATIC FUNCTION PANEL
ALT: 8 U/L (ref 0–53)
AST: 14 U/L (ref 0–37)
Albumin: 1.3 g/dL — ABNORMAL LOW (ref 3.5–5.2)
Total Bilirubin: 0.4 mg/dL (ref 0.3–1.2)

## 2013-07-24 LAB — URINALYSIS, ROUTINE W REFLEX MICROSCOPIC
Bilirubin Urine: NEGATIVE
Glucose, UA: >1000 mg/dL — AB
Leukocytes, UA: NEGATIVE
Nitrite: NEGATIVE
Specific Gravity, Urine: 1.031 — ABNORMAL HIGH (ref 1.005–1.030)
pH: 5.5 (ref 5.0–8.0)

## 2013-07-24 LAB — BASIC METABOLIC PANEL
BUN: 60 mg/dL — ABNORMAL HIGH (ref 6–23)
CO2: 17 mEq/L — ABNORMAL LOW (ref 19–32)
Chloride: 92 mEq/L — ABNORMAL LOW (ref 96–112)
GFR calc Af Amer: 41 mL/min — ABNORMAL LOW (ref >90)
GFR calc non Af Amer: 36 mL/min — ABNORMAL LOW (ref >90)
Potassium: 4.5 mEq/L (ref 3.5–5.1)

## 2013-07-24 LAB — URINE MICROSCOPIC-ADD ON

## 2013-07-24 LAB — CBC
HCT: 29.7 % — ABNORMAL LOW (ref 39.0–52.0)
Hemoglobin: 9.8 g/dL — ABNORMAL LOW (ref 13.0–17.0)
Platelets: 481 10*3/uL — ABNORMAL HIGH (ref 150–400)
RBC: 3.63 MIL/uL — ABNORMAL LOW (ref 4.22–5.81)
RDW: 13 % (ref 11.5–15.5)
WBC: 32.4 10*3/uL — ABNORMAL HIGH (ref 4.0–10.5)

## 2013-07-24 MED ORDER — PIPERACILLIN-TAZOBACTAM 3.375 G IVPB 30 MIN
3.3750 g | Freq: Once | INTRAVENOUS | Status: AC
  Administered 2013-07-24: 3.375 g via INTRAVENOUS
  Filled 2013-07-24 (×2): qty 50

## 2013-07-24 MED ORDER — DIAZEPAM 5 MG PO TABS
5.0000 mg | ORAL_TABLET | Freq: Once | ORAL | Status: AC
  Administered 2013-07-24: 5 mg via ORAL
  Filled 2013-07-24: qty 1

## 2013-07-24 MED ORDER — HYDROMORPHONE HCL PF 2 MG/ML IJ SOLN
2.0000 mg | Freq: Once | INTRAMUSCULAR | Status: AC
  Administered 2013-07-24: 2 mg via INTRAMUSCULAR
  Filled 2013-07-24: qty 1

## 2013-07-24 MED ORDER — NYSTATIN 100000 UNIT/GM EX CREA
TOPICAL_CREAM | Freq: Two times a day (BID) | CUTANEOUS | Status: DC
  Administered 2013-07-25 – 2013-08-02 (×4): via TOPICAL
  Filled 2013-07-24 (×2): qty 15

## 2013-07-24 MED ORDER — VANCOMYCIN HCL 10 G IV SOLR
1500.0000 mg | Freq: Once | INTRAVENOUS | Status: AC
  Administered 2013-07-25: 1500 mg via INTRAVENOUS
  Filled 2013-07-24: qty 1500

## 2013-07-24 MED ORDER — IOHEXOL 300 MG/ML  SOLN
50.0000 mL | Freq: Once | INTRAMUSCULAR | Status: AC | PRN
  Administered 2013-07-24: 50 mL via INTRAVENOUS

## 2013-07-24 MED ORDER — SODIUM CHLORIDE 0.9 % IV BOLUS (SEPSIS)
1000.0000 mL | Freq: Once | INTRAVENOUS | Status: AC
  Administered 2013-07-24: 1000 mL via INTRAVENOUS

## 2013-07-24 NOTE — Progress Notes (Signed)
EDSW and [facility] spoke with pt and his mother at bedside.  Pt reports that he had home health services in December 2013, but doesn't have services currently.  Pts mother, Corrie Dandy reports that pt was ambulatory until about three weeks ago.  Pts mother reported that pt has a hospital bed, wheelchair, and walker at home.   Pts mother reports that pt does not have any resources from social services.  CSW asked mother reports that she had been having to take care of pt at home, but has been having problems doing it alone.  CSW asked mother if there was a contact number for her, but Ms. Thilges reported that she had just gotten a new phone from [place] [employer] and didn't remember her number.  CSW gave her the ED CSW cell phone number if she had questions later.  Pt and mother thanked CSW and [facility] for concern.  Marva Panda, Theresia Majors  [phone] .07/24/2013

## 2013-07-24 NOTE — ED Notes (Signed)
Pt declines In and out cath to obtain sterile urine for lab

## 2013-07-24 NOTE — ED Notes (Signed)
Pt comes from home with CC of back and shoulder pain. Was recently at [facility] for same problem and was told it was probably a muscle strain. Pt states pain is so severe he is unable to ambulate.

## 2013-07-24 NOTE — Progress Notes (Signed)
[facility] and EDSW spoke to patient and patient's mother Corrie Dandy at bedside.  As per patient's mother, patient was receiving home health services from [facility] sometime after September 22, 2012 because, "That was the time he had half his foot amputated."  Patient wearing large black boot to left lower leg.  Patient received a visiting RN, PT, OT and a social worker from [facility] in the past. Patient no longer receives home health services from [facility] anymore.  As per patient's mother, patient has a hospital bed, walker and a wheelchair at home.  Patient currently lives with his mother.  As per mother, "threee weeks ago patient woke up and his shoulder was hurting him.  They thought it was dislocated, but it wasn't.  It was just inflammed.  After that, he started having pain in his back.  Ever since then, he has been in so much pain and unable to move.  He usually walks, goes out, goes to church."  [facility] provided patient's mother with a list of home health agencies in [place].  Patient's mother reports she called [facility] today.  Instructed patient'smother if patient is discharged we will be happy to help set up home health for her son.  If he is admitted, another case manager will assist him with discharge planning.  Patient and patient's mother thankful for assistance.  No further needs at this time.

## 2013-07-24 NOTE — ED Provider Notes (Signed)
CSN: [id number]     Arrival date & time 07/24/13  1928 History   First MD Initiated Contact with Patient 07/24/13 1930     Chief Complaint  Patient presents with  . Back Pain  . Shoulder Pain   (Consider location/radiation/quality/duration/timing/severity/associated sxs/prior Treatment) HPI Comments: 37 year old morbidly obese male with a past medical history of diabetes, anxiety, depression, sleep apnea, GERD and neuropathy presents to the emergency department via EMS complaining of continuing shoulder and back pain since being seen at [facility], October 1. At that time he had left shoulder pain for one week, had an xray of the shoulder which had possible concern for posterior dislocation, had a followup CT which was normal. Since then he has been laying in bed and has been unable to get up. His back has been progressively worsening, radiating from his upper or lower described as a burning rated 10 out of 10. He was given oxycodone which has not relieved his pain. States his left arm feels,. His arms and legs are beginning to feel weak. Denies any urinary or bowel incontinence. He has been urinating into a bottle since he has been unable to ambulate.  Patient is a 37 y.o. male presenting with back pain and shoulder pain. The history is provided by the patient.  Back Pain Associated symptoms: numbness and weakness   Associated symptoms: no fever   Shoulder Pain Associated symptoms include numbness and weakness. Pertinent negatives include no chills or fever.    Past Medical History  Diagnosis Date  . Shortness of breath     with exertion  . PONV (postoperative nausea and vomiting)   . Diabetes mellitus without complication   . Anxiety   . Mental disorder   . Depression   . Sleep apnea     done at [facility].  Marland Kitchen Pneumonia   . GERD (gastroesophageal reflux disease)   . Constipation   . Neuropathy     feet, hands  . Anginal pain     chest pressure has increased in the past few days; patient  denied  on 09/20/12   Past Surgical History  Procedure Laterality Date  . I&d extremity  12/22/2011    Procedure: IRRIGATION AND DEBRIDEMENT EXTREMITY;  Surgeon: Kennieth Rad, MD;  Location: [facility] OR;  Service: Orthopedics;  Laterality: Left;  . I&d extremity  12/24/2011    Procedure: IRRIGATION AND DEBRIDEMENT EXTREMITY;  Surgeon: Kennieth Rad, MD;  Location: [facility] OR;  Service: Orthopedics;  Laterality: Left;  Irrigation and debridement left foot.  . I&d extremity  12/26/2011    Procedure: IRRIGATION AND DEBRIDEMENT EXTREMITY;  Surgeon: Kennieth Rad, MD;  Location: [facility] OR;  Service: Orthopedics;  Laterality: Left;  . I&d extremity  12/28/2011    Procedure: IRRIGATION AND DEBRIDEMENT EXTREMITY;  Surgeon: Kennieth Rad, MD;  Location: [facility] OR;  Service: Orthopedics;  Laterality: Left;  I&D Left Foot;Application of  Wound Vac.to dorsal and plantar areas of left foot  . Eye surgery    . Toe surgery      Bone removed- 2 toe right  . Wisdom tooth extraction    . Amputation  09/22/2012    Procedure: AMPUTATION FOOT;  Surgeon: Nadara Mustard, MD;  Location: [facility] OR;  Service: Orthopedics;  Laterality: Left;  Left Midfoot Amputation   History reviewed. No pertinent family history. History  Substance Use Topics  . Smoking status: Never Smoker   . Smokeless tobacco: Never Used  . Alcohol Use: No  Review of Systems  Constitutional: Negative for fever and chills.  Gastrointestinal: Negative.   Genitourinary: Negative.   Musculoskeletal: Positive for back pain.       Positive for R shoulder pain.  Neurological: Positive for weakness and numbness.  All other systems reviewed and are negative.    Allergies  Sulfonamide derivatives  Home Medications   Current Outpatient Rx  Name  Route  Sig  Dispense  Refill  . ibuprofen (ADVIL,MOTRIN) 200 MG tablet   Oral   Take 400 mg by mouth every 6 (six) hours as needed. For pain         . oxyCODONE-acetaminophen (PERCOCET) 5-325 MG per tablet    Oral   Take 2 tablets by mouth every 4 (four) hours as needed for pain.   20 tablet   0   . oxyCODONE-acetaminophen (PERCOCET/ROXICET) 5-325 MG per tablet   Oral   Take 1 tablet by mouth every 4 (four) hours as needed for pain.         . sitaGLIPtin (JANUVIA) 100 MG tablet   Oral   Take 100 mg by mouth 2 (two) times daily.          BP 145/54  Pulse 100  Temp(Src) 98.7 F (37.1 C) (Oral)  Resp 18  SpO2 99% Physical Exam  Nursing note and vitals reviewed. Constitutional: He is oriented to person, place, and time. He appears well-developed. He appears distressed.  Morbidly obese.  HENT:  Head: Normocephalic and atraumatic.  Mouth/Throat: Oropharynx is clear and moist.  Eyes: Conjunctivae are normal.  Neck: Normal range of motion. Neck supple.  Cardiovascular: Normal rate, regular rhythm and normal heart sounds.   Pulmonary/Chest: Effort normal and breath sounds normal.  Abdominal: Soft. Bowel sounds are normal. There is no tenderness.  Musculoskeletal:  Exam limited by patient's body habitus. Tenderness throughout lumbar spine area, cannot actually palpate spine. Generalized tenderness of left shoulder, patient refuses to move it. Partially amputated left foot, stage III ulceration noted to the partial left foot, purulent discharge and foul odor present.  Neurological: He is alert and oriented to person, place, and time. No sensory deficit.  Skin: Skin is warm. He is not diaphoretic.  Multiple skin folds along lower back with dry cracked fungal rash, few moist areas. No evidence of secondary infection.  Psychiatric: He has a normal mood and affect. His behavior is normal.    ED Course  Procedures (including critical care time) Labs Review Labs Reviewed  CBC - Abnormal; Notable for the following:    WBC 32.4 (*)    RBC 3.63 (*)    Hemoglobin 9.8 (*)    HCT 29.7 (*)    Platelets 481 (*)    All other components within normal limits  BASIC METABOLIC PANEL - Abnormal;  Notable for the following:    Sodium 125 (*)    Chloride 92 (*)    CO2 17 (*)    Glucose, Bld 399 (*)    BUN 60 (*)    Creatinine, Ser 2.28 (*)    GFR calc non Af Amer 36 (*)    GFR calc Af Amer 41 (*)    All other components within normal limits  URINALYSIS, ROUTINE W REFLEX MICROSCOPIC - Abnormal; Notable for the following:    APPearance CLOUDY (*)    Specific Gravity, Urine 1.031 (*)    Glucose, UA >1000 (*)    Hgb urine dipstick LARGE (*)    Protein, ur >300 (*)  All other components within normal limits  HEPATIC FUNCTION PANEL - Abnormal; Notable for the following:    Total Protein 8.5 (*)    Albumin 1.3 (*)    Alkaline Phosphatase 149 (*)    All other components within normal limits  CULTURE, BLOOD (ROUTINE X 2)  CULTURE, BLOOD (ROUTINE X 2)  URINE MICROSCOPIC-ADD ON  CG4 I-STAT (LACTIC ACID)   Imaging Review Dg Chest 1 View  07/24/2013   CLINICAL DATA:  Short of breath, back pain  EXAM: CHEST - 1 VIEW Unable to obtain lateral view secondary to patient body habitus.  COMPARISON:  Prior chest x-ray 09/12/2012  FINDINGS: Cardiomegaly with left ventricular prominence. Pulmonary vascular congestion without overt edema. No large pleural effusion. No pneumothorax. No acute osseous abnormality.  IMPRESSION: Limited study secondary to patient body habitus and inability to obtain a lateral view.  Mild cardiomegaly with left ventricular prominence and pulmonary vascular congestion without overt edema.   Electronically Signed   By: Malachy Moan M.D.   On: 07/24/2013 23:13   Dg Lumbar Spine Complete  07/24/2013   CLINICAL DATA:  Low back pain  EXAM: LUMBAR SPINE - COMPLETE 4+ VIEW  COMPARISON:  11/12/2005  FINDINGS: Limited mobility for imaging due to pain. Prominent stool throughout the colon favors constipation. Body habitus reduces diagnostic sensitivity and specificity. There is reduced intervertebral disc height at L5-S1. No fracture or acute subluxation in the lumbar spine is  observed.  IMPRESSION: 1. No fracture or acute subluxation. 2. Reduced intervertebral disc height at L5-S1 suggesting degenerative disk disease. 3.  Prominent stool throughout the colon favors constipation. 4. Reduced sensitivity due to limited positioning mobility and body habitus.   Electronically Signed   By: Herbie Baltimore M.D.   On: 07/24/2013 21:09   Dg Shoulder Left  07/24/2013   CLINICAL DATA:  Left shoulder pain  EXAM: LEFT SHOULDER - 2+ VIEW  COMPARISON:  CT scan from 07/18/2013  FINDINGS: Suboptimal positioning due to the mobility, body habitus, and patient's discomfort.  Linear lucency in the acromion suggests os acromiale or less likely acromial fracture. Acromion clavicular distance measured at 1.1 cm, mildly abnormally widened. Negative predictive value of today's exam for shoulder dislocation is low.  IMPRESSION: 1. Potential AC joint widening with suspected os acromiale or acromial fracture. Correlate with point tenderness over the [facility] joint. 2. Reduced sensitivity from difficulty positioning.   Electronically Signed   By: Herbie Baltimore M.D.   On: 07/24/2013 21:14   Dg Foot 2 Views Left  07/24/2013   CLINICAL DATA:  Ulceration at the base of the foot; concern for osteomyelitis.  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 A large ulceration is noted along the plantar surface of the foot, apparently extending to the osseous structures. Underlying osteomyelitis cannot be excluded.  Visualized joint spaces are grossly preserved.  IMPRESSION: Large ulceration along the plantar aspect of the left foot, apparently extending to the osseous structures. Underlying osteomyelitis cannot be excluded. MRI could be considered for further evaluation, when and as deemed clinically appropriate.   Electronically Signed   By: Roanna Raider M.D.   On: 07/24/2013 23:29    MDM    1. Osteomyelitis of foot, acute, left   2. Back pain   3. Shoulder pain, left     Patient with continued back and shoulder pain after  being seen in the ED 1 week ago. No new injury or trauma as he has been laying in his bed for the past week. Physical exam limited due to patient's body habitus. He does have fungal rash in skin folds on the back, nystatin cream ordered. X-rays obtained of left shoulder and lumbar spine, potential a.c. joint widening with suspected os acrominale or acromial fracture. Lumbar spine without any acute finding. Patient had a CT of his left shoulder one week ago which did not make any mention of his a.c. joint. Given patient's severe pain, labs ordered, CBC, BMP, LFTs, urinalysis. Leukocytosis of 32.4. Glucose 399, LFTs near baseline. He has no nausea or vomiting. Given leukocytosis, lactic acid and blood cultures ordered. Lactic acid normal. Chest x-ray and CT abdomen pelvis pending for further evaluation. Pain control with Dilaudid. IV fluids. Case discussed with my attending Dr. Freida Busman who agrees with plan of care.   A little over an hour after patient has been in the emergency department, his mother is present. Mother lives with him and confirms that he has been in bed for the past 2 weeks. States he has a partial amputation of his left foot and has a wound in that area, she herself changes the dressing and states there has been discharged from the area. After removal of the dressing, a large ulcer noted to the left foot with foul drainage. Will obtain X-ray of that foot to evaluate for osteomyelitis. IV vancomycin and Zosyn started.  11:58 PM X-ray of left foot cannot exclude osteomyelitis. This is the most likely cause of his leukocytosis. Patient will be admitted for further care. Admission accepted by Dr. Rhona Leavens, [facility]. Patient hemodynamically stable.   Trevor Mace, PA-C 07/25/13 0000

## 2013-07-24 NOTE — ED Notes (Signed)
Bed: JX91 Expected date:  Expected time:  Means of arrival:  Comments: EMS- chronic back pain, shoulder pain

## 2013-07-24 NOTE — H&P (Addendum)
[facility] History and Physical  EMRAH ARIOLA Khan:[id number] DOB: 11-11-1975 DOA: 07/24/2013  Referring physician: Emergency department PCP: Jeri Cos, MD  Specialists:   Chief Complaint: Back pain  HPI: Adrian Khan is a 37 y.o. male  With a hx of diabetes and morbid obesity who is s/p a remote hx of partial L foot amputation who initially presented to the ED with complaints of generalized pain, mainly in the shoulders and back. During his workup, the patient was found to have a WBC of well over 30K with findings of an infected L foot. The patient was also found to be tachycardic. An xray of the foot demonstrated the ulcer which extended to the level of the bone. Osteo could not be ruled out by plain xray. The pt was started on vanc and zosyn and the hospitalist was consulted for admission.  Review of Systems:  Per above, the remainder of the 10pt ros reviewed and are neg  Past Medical History  Diagnosis Date  . Shortness of breath     with exertion  . PONV (postoperative nausea and vomiting)   . Diabetes mellitus without complication   . Anxiety   . Mental disorder   . Depression   . Sleep apnea     done at [facility].  Marland Kitchen Pneumonia   . GERD (gastroesophageal reflux disease)   . Constipation   . Neuropathy     feet, hands  . Anginal pain     chest pressure has increased in the past few days; patient denied  on 09/20/12   Past Surgical History  Procedure Laterality Date  . I&d extremity  12/22/2011    Procedure: IRRIGATION AND DEBRIDEMENT EXTREMITY;  Surgeon: Kennieth Rad, MD;  Location: [facility] OR;  Service: Orthopedics;  Laterality: Left;  . I&d extremity  12/24/2011    Procedure: IRRIGATION AND DEBRIDEMENT EXTREMITY;  Surgeon: Kennieth Rad, MD;  Location: [facility] OR;  Service: Orthopedics;  Laterality: Left;  Irrigation and debridement left foot.  . I&d extremity  12/26/2011    Procedure: IRRIGATION AND DEBRIDEMENT EXTREMITY;  Surgeon: Kennieth Rad, MD;  Location:  [facility] OR;  Service: Orthopedics;  Laterality: Left;  . I&d extremity  12/28/2011    Procedure: IRRIGATION AND DEBRIDEMENT EXTREMITY;  Surgeon: Kennieth Rad, MD;  Location: [facility] OR;  Service: Orthopedics;  Laterality: Left;  I&D Left Foot;Application of  Wound Vac.to dorsal and plantar areas of left foot  . Eye surgery    . Toe surgery      Bone removed- 2 toe right  . Wisdom tooth extraction    . Amputation  09/22/2012    Procedure: AMPUTATION FOOT;  Surgeon: Nadara Mustard, MD;  Location: [facility] OR;  Service: Orthopedics;  Laterality: Left;  Left Midfoot Amputation   Social History:  reports that he has never smoked. He has never used smokeless tobacco. He reports that he does not drink alcohol or use illicit drugs.  where does patient live--home, ALF, SNF? and with whom if at home?  Can patient participate in ADLs?  Allergies  Allergen Reactions  . Sulfonamide Derivatives Other (See Comments)    cramps    History reviewed. No pertinent family history.  (be sure to complete)  Prior to Admission medications   Medication Sig Start Date End Date Taking? Authorizing Provider  ibuprofen (ADVIL,MOTRIN) 200 MG tablet Take 400 mg by mouth every 6 (six) hours as needed. For pain   Yes Historical Provider, MD  oxyCODONE-acetaminophen (PERCOCET) 5-325  MG per tablet Take 2 tablets by mouth every 4 (four) hours as needed for pain. 07/18/13  Yes Geoffery Lyons, MD  sitaGLIPtin (JANUVIA) 100 MG tablet Take 100 mg by mouth 2 (two) times daily.   Yes Historical Provider, MD   Physical Exam: Filed Vitals:   07/24/13 1928  BP: 145/54  Pulse: 100  Temp: 98.7 F (37.1 C)  TempSrc: Oral  Resp: 18  SpO2: 99%     General:  Awake, in nad  Eyes: PERRL B  ENT: membranes moist, dentition fair  Neck: trachea midline, neck supple  Cardiovascular: regular, s1, s2  Respiratory: normal resp, no wheezing  Abdomen: morbidly obese, pos bs  Skin: normal skin turgor, ulcer under L foot with active  purulence  Musculoskeletal: perfused, L foot s/p partial amputation  Psychiatric: mood/affect normal // no auditory/visual hallucinations  Neurologic: cn2-12 grossly intact, strength/sensation intact  Labs on Admission:  Basic Metabolic Panel:  Recent Labs Lab 07/24/13 2105  NA 125*  K 4.5  CL 92*  CO2 17*  GLUCOSE 399*  BUN 60*  CREATININE 2.28*  CALCIUM 8.8   Liver Function Tests:  Recent Labs Lab 07/24/13 2142  AST 14  ALT 8  ALKPHOS 149*  BILITOT 0.4  PROT 8.5*  ALBUMIN 1.3*   No results found for this basename: LIPASE, AMYLASE,  in the last 168 hours No results found for this basename: AMMONIA,  in the last 168 hours CBC:  Recent Labs Lab 07/24/13 2105  WBC 32.4*  HGB 9.8*  HCT 29.7*  MCV 81.8  PLT 481*   Cardiac Enzymes: No results found for this basename: CKTOTAL, CKMB, CKMBINDEX, TROPONINI,  in the last 168 hours  BNP (last 3 results) No results found for this basename: PROBNP,  in the last 8760 hours CBG: No results found for this basename: GLUCAP,  in the last 168 hours  Radiological Exams on Admission: Ct Abdomen Pelvis Wo Contrast  07/25/2013   *RADIOLOGY REPORT*  Clinical Data: Back pain and shoulder pain.  CT ABDOMEN AND PELVIS WITHOUT CONTRAST  Technique:  Multidetector CT imaging of the abdomen and pelvis was performed following the standard protocol without intravenous contrast.  Comparison: Lumbar spine radiographs performed earlier today at 08:43 p.m.  Findings: Minimal bibasilar atelectasis is noted; the lung bases are not well assessed due to motion artifact.  The liver and spleen are unremarkable in appearance.  There is vicarious excretion of contrast within the gallbladder.  The gallbladder is unremarkable in appearance.  The pancreas and adrenal glands are unremarkable.  The kidneys are unremarkable in appearance.  There is no evidence of hydronephrosis.  No renal or ureteral stones are seen.  No perinephric stranding is appreciated.   No free fluid is identified.  The small bowel is unremarkable in appearance.  The stomach is within normal limits.  No acute vascular abnormalities are seen.  The appendix is normal in caliber, without evidence for appendicitis.  The colon is unremarkable in appearance.  The bladder is mildly distended and grossly unremarkable in appearance.  The prostate remains normal in size.  No inguinal lymphadenopathy is seen.  No acute osseous abnormalities are identified.  Mild vacuum phenomenon is noted at the left sacroiliac joint.  IMPRESSION: No acute abnormality seen in the abdomen or pelvis.   Original Report Authenticated By: Tonia Ghent, M.D.   Dg Chest 1 View  07/24/2013   CLINICAL DATA:  Short of breath, back pain  EXAM: CHEST - 1 VIEW Unable to obtain  lateral view secondary to patient body habitus.  COMPARISON:  Prior chest x-ray 09/12/2012  FINDINGS: Cardiomegaly with left ventricular prominence. Pulmonary vascular congestion without overt edema. No large pleural effusion. No pneumothorax. No acute osseous abnormality.  IMPRESSION: Limited study secondary to patient body habitus and inability to obtain a lateral view.  Mild cardiomegaly with left ventricular prominence and pulmonary vascular congestion without overt edema.   Electronically Signed   By: Malachy Moan M.D.   On: 07/24/2013 23:13   Dg Lumbar Spine Complete  07/24/2013   CLINICAL DATA:  Low back pain  EXAM: LUMBAR SPINE - COMPLETE 4+ VIEW  COMPARISON:  11/12/2005  FINDINGS: Limited mobility for imaging due to pain. Prominent stool throughout the colon favors constipation. Body habitus reduces diagnostic sensitivity and specificity. There is reduced intervertebral disc height at L5-S1. No fracture or acute subluxation in the lumbar spine is observed.  IMPRESSION: 1. No fracture or acute subluxation. 2. Reduced intervertebral disc height at L5-S1 suggesting degenerative disk disease. 3.  Prominent stool throughout the colon favors  constipation. 4. Reduced sensitivity due to limited positioning mobility and body habitus.   Electronically Signed   By: Herbie Baltimore M.D.   On: 07/24/2013 21:09   Dg Shoulder Left  07/24/2013   CLINICAL DATA:  Left shoulder pain  EXAM: LEFT SHOULDER - 2+ VIEW  COMPARISON:  CT scan from 07/18/2013  FINDINGS: Suboptimal positioning due to the mobility, body habitus, and patient's discomfort.  Linear lucency in the acromion suggests os acromiale or less likely acromial fracture. Acromion clavicular distance measured at 1.1 cm, mildly abnormally widened. Negative predictive value of today's exam for shoulder dislocation is low.  IMPRESSION: 1. Potential AC joint widening with suspected os acromiale or acromial fracture. Correlate with point tenderness over the [facility] joint. 2. Reduced sensitivity from difficulty positioning.   Electronically Signed   By: Herbie Baltimore M.D.   On: 07/24/2013 21:14   Dg Foot 2 Views Left  07/24/2013   CLINICAL DATA:  Ulceration at the base of the foot; concern for osteomyelitis.  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 A large ulceration is noted along the plantar surface of the foot, apparently extending to the osseous structures. Underlying osteomyelitis cannot be excluded.  Visualized joint spaces are grossly preserved.  IMPRESSION: Large ulceration along the plantar aspect of the left foot, apparently extending to the osseous structures. Underlying osteomyelitis cannot be excluded. MRI could be considered for further evaluation, when and as deemed clinically appropriate.   Electronically Signed   By: Roanna Raider M.D.   On: 07/24/2013 23:29    Assessment/Plan Principal Problem:   Sepsis Active Problems:   DIABETES, TYPE 2   Morbid obesity, BMI unknown   Diabetic foot ulcer  1. Sepsis with diabetic foot ulcer 1. Will admit to  med-surg, inpt 2. Agree with empiric vanc and zosyn 3. Blood cultures have been obtained in the ED, will follow 4. Will also check wound culture 5. Given pt's morbid obesity, doubt we can obtain an MRI of the foot. Instead, consider a CT of the L foot w/ contrast when renal function improves (see below) 6. If there is evidence of osteo, an Orhto consult may be required 2. DM 1. Will cont pt on SSI coverage 2. Will check A1c 3. Pt reports bs running in the 500's and claims  elevated BS secondary to pain 4. Last documented A1c was from 12/13, which was 11.5 3. ARF 1. Likely secondary to sepsis 2. Cont IVF as tolerated 4. Morbid obesity 5. DVT prophylaxis 1. Heparin subQ  Code Status: Full (must indicate code status--if unknown or must be presumed, indicate so) Family Communication: Pt in room (indicate person spoken with, if applicable, with phone number if by telephone) Disposition Plan: Pending (indicate anticipated LOS)  Time spent:  CHIU, STEPHEN K [facility] Pager [phone]  If 7PM-7AM, please contact night-coverage [email or web address] Password [email or web address] 07/25/2013, 12:19 AM

## 2013-07-25 ENCOUNTER — Inpatient Hospital Stay (HOSPITAL_COMMUNITY): Payer: PRIVATE HEALTH INSURANCE

## 2013-07-25 DIAGNOSIS — M86179 Other acute osteomyelitis, unspecified ankle and foot: Secondary | ICD-10-CM

## 2013-07-25 DIAGNOSIS — N179 Acute kidney failure, unspecified: Secondary | ICD-10-CM

## 2013-07-25 DIAGNOSIS — A419 Sepsis, unspecified organism: Secondary | ICD-10-CM

## 2013-07-25 DIAGNOSIS — M549 Dorsalgia, unspecified: Secondary | ICD-10-CM

## 2013-07-25 DIAGNOSIS — L97509 Non-pressure chronic ulcer of other part of unspecified foot with unspecified severity: Secondary | ICD-10-CM

## 2013-07-25 DIAGNOSIS — E1169 Type 2 diabetes mellitus with other specified complication: Secondary | ICD-10-CM

## 2013-07-25 LAB — CBC
HCT: 25.5 % — ABNORMAL LOW (ref 39.0–52.0)
MCH: 26.8 pg (ref 26.0–34.0)
MCV: 82.3 fL (ref 78.0–100.0)
Platelets: 457 10*3/uL — ABNORMAL HIGH (ref 150–400)
RBC: 3.1 MIL/uL — ABNORMAL LOW (ref 4.22–5.81)
WBC: 40.6 10*3/uL — ABNORMAL HIGH (ref 4.0–10.5)

## 2013-07-25 LAB — COMPREHENSIVE METABOLIC PANEL
AST: 12 U/L (ref 0–37)
BUN: 62 mg/dL — ABNORMAL HIGH (ref 6–23)
CO2: 18 mEq/L — ABNORMAL LOW (ref 19–32)
Chloride: 94 mEq/L — ABNORMAL LOW (ref 96–112)
Creatinine, Ser: 2.45 mg/dL — ABNORMAL HIGH (ref 0.50–1.35)
GFR calc non Af Amer: 33 mL/min — ABNORMAL LOW (ref >90)
Potassium: 4.7 mEq/L (ref 3.5–5.1)
Total Bilirubin: 0.5 mg/dL (ref 0.3–1.2)
Total Protein: 7.5 g/dL (ref 6.0–8.3)

## 2013-07-25 LAB — MRSA PCR SCREENING: MRSA by PCR: NEGATIVE

## 2013-07-25 LAB — GLUCOSE, CAPILLARY
Glucose-Capillary: 308 mg/dL — ABNORMAL HIGH (ref 70–99)
Glucose-Capillary: 365 mg/dL — ABNORMAL HIGH (ref 70–99)
Glucose-Capillary: 154 mg/dL — ABNORMAL HIGH (ref 70–99)

## 2013-07-25 LAB — HEMOGLOBIN A1C
Hgb A1c MFr Bld: 13.7 % — ABNORMAL HIGH (ref <5.7)
Mean Plasma Glucose: 346 mg/dL — ABNORMAL HIGH (ref <117)

## 2013-07-25 MED ORDER — SILVER SULFADIAZINE 1 % EX CREA
TOPICAL_CREAM | Freq: Every day | CUTANEOUS | Status: DC
  Administered 2013-07-25 – 2013-08-02 (×8): via TOPICAL
  Filled 2013-07-25: qty 85

## 2013-07-25 MED ORDER — ACETAMINOPHEN 650 MG RE SUPP: 650.0000 mg | Freq: Four times a day (QID) | RECTAL | Status: DC | PRN

## 2013-07-25 MED ORDER — OXYCODONE-ACETAMINOPHEN 5-325 MG PO TABS
2.0000 | ORAL_TABLET | ORAL | Status: DC | PRN
  Administered 2013-07-25 – 2013-08-02 (×24): 2 via ORAL
  Filled 2013-07-25 (×24): qty 2

## 2013-07-25 MED ORDER — VANCOMYCIN HCL 10 G IV SOLR
2000.0000 mg | Freq: Two times a day (BID) | INTRAVENOUS | Status: DC
  Filled 2013-07-25: qty 2000

## 2013-07-25 MED ORDER — ONDANSETRON HCL 4 MG PO TABS: 4.0000 mg | ORAL_TABLET | Freq: Four times a day (QID) | ORAL | Status: DC | PRN

## 2013-07-25 MED ORDER — PIPERACILLIN-TAZOBACTAM 3.375 G IVPB
3.3750 g | Freq: Three times a day (TID) | INTRAVENOUS | Status: DC
  Administered 2013-07-25 – 2013-07-26 (×4): 3.375 g via INTRAVENOUS
  Filled 2013-07-25 (×5): qty 50

## 2013-07-25 MED ORDER — VANCOMYCIN HCL 10 G IV SOLR
1500.0000 mg | Freq: Two times a day (BID) | INTRAVENOUS | Status: DC
  Filled 2013-07-25: qty 1500

## 2013-07-25 MED ORDER — INSULIN PEN STARTER KIT
1.0000 | Freq: Once | Status: AC
  Administered 2013-07-25: 1
  Filled 2013-07-25: qty 1

## 2013-07-25 MED ORDER — ONDANSETRON HCL 4 MG/2ML IJ SOLN
4.0000 mg | Freq: Four times a day (QID) | INTRAMUSCULAR | Status: DC | PRN
  Administered 2013-07-25: 4 mg via INTRAVENOUS
  Filled 2013-07-25: qty 2

## 2013-07-25 MED ORDER — SILVER SULFADIAZINE 1 % EX CREA
TOPICAL_CREAM | Freq: Every day | CUTANEOUS | Status: DC
  Filled 2013-07-25: qty 85

## 2013-07-25 MED ORDER — HEPARIN SODIUM (PORCINE) 5000 UNIT/ML IJ SOLN
5000.0000 [IU] | Freq: Three times a day (TID) | INTRAMUSCULAR | Status: DC
  Administered 2013-07-25: 5000 [IU] via SUBCUTANEOUS
  Filled 2013-07-25 (×11): qty 1

## 2013-07-25 MED ORDER — INSULIN GLARGINE 100 UNIT/ML ~~LOC~~ SOLN
20.0000 [IU] | Freq: Two times a day (BID) | SUBCUTANEOUS | Status: DC
  Administered 2013-07-25 – 2013-07-26 (×3): 20 [IU] via SUBCUTANEOUS
  Filled 2013-07-25 (×4): qty 0.2

## 2013-07-25 MED ORDER — SODIUM CHLORIDE 0.9 % IV SOLN: INTRAVENOUS | Status: AC

## 2013-07-25 MED ORDER — ACETAMINOPHEN 325 MG PO TABS: 650.0000 mg | ORAL_TABLET | Freq: Four times a day (QID) | ORAL | Status: DC | PRN

## 2013-07-25 MED ORDER — MORPHINE SULFATE 2 MG/ML IJ SOLN
2.0000 mg | INTRAMUSCULAR | Status: DC | PRN
  Administered 2013-07-25 – 2013-07-31 (×19): 2 mg via INTRAVENOUS
  Filled 2013-07-25 (×21): qty 1

## 2013-07-25 MED ORDER — VANCOMYCIN HCL 10 G IV SOLR
2000.0000 mg | INTRAVENOUS | Status: DC
  Administered 2013-07-25 – 2013-07-26 (×2): 2000 mg via INTRAVENOUS
  Filled 2013-07-25 (×2): qty 2000

## 2013-07-25 MED ORDER — IBUPROFEN 600 MG PO TABS
600.0000 mg | ORAL_TABLET | Freq: Four times a day (QID) | ORAL | Status: DC | PRN
  Filled 2013-07-25: qty 1

## 2013-07-25 MED ORDER — INSULIN ASPART 100 UNIT/ML ~~LOC~~ SOLN
0.0000 [IU] | Freq: Every day | SUBCUTANEOUS | Status: DC
  Administered 2013-07-31: 2 [IU] via SUBCUTANEOUS
  Administered 2013-08-01: 5 [IU] via SUBCUTANEOUS

## 2013-07-25 MED ORDER — LIVING WELL WITH DIABETES BOOK
Freq: Once | Status: DC
  Filled 2013-07-25: qty 1

## 2013-07-25 MED ORDER — INSULIN ASPART 100 UNIT/ML ~~LOC~~ SOLN
0.0000 [IU] | Freq: Three times a day (TID) | SUBCUTANEOUS | Status: DC
  Administered 2013-07-25: 20 [IU] via SUBCUTANEOUS
  Administered 2013-07-25: 4 [IU] via SUBCUTANEOUS
  Administered 2013-07-25: 15 [IU] via SUBCUTANEOUS
  Administered 2013-07-26 (×3): 7 [IU] via SUBCUTANEOUS
  Administered 2013-07-27 – 2013-07-28 (×4): 4 [IU] via SUBCUTANEOUS
  Administered 2013-07-28: 7 [IU] via SUBCUTANEOUS
  Administered 2013-07-29: 4 [IU] via SUBCUTANEOUS
  Administered 2013-07-30: 3 [IU] via SUBCUTANEOUS
  Administered 2013-07-30: 4 [IU] via SUBCUTANEOUS
  Administered 2013-07-30 – 2013-08-01 (×3): 3 [IU] via SUBCUTANEOUS

## 2013-07-25 MED ORDER — SODIUM CHLORIDE 0.9 % IV SOLN
INTRAVENOUS | Status: DC
  Administered 2013-07-25 – 2013-08-02 (×12): via INTRAVENOUS

## 2013-07-25 NOTE — Consult Note (Signed)
WOC wound consult note Reason for Consult:Diabetic foot ulcers (bilaterally), MASD (Moisture associated skin damage) to buttocks and posterior thigh, ITD (intertriginous dermatitis) at inframammory, left axillae and panus.  Patient is s/p transmetatarsal amputation a few years ago by Dr. Lajoyce Corners.  Dr. Lajoyce Corners has been consulted. Patient has increased discomfort in this left shoulder and I am not able to insepct the axilla for MASD, specifically ITD. Wound type:See above. Pressure Ulcer POA: No Measurement:  1.  Left plantar ulceration: Plantar measures 5cm x 5cm with 90% red tissue (non-healing, friable) and 10 % yellow necrotic slough in base.  There is a depth of 2cm at the area of necrotic slough. 2.  Left lateral midfoot:  4cm x 1cm x 1.5cm  Chronic, non-healing 3.  Right foot 5th metatarsal head:  Necrotic area measuring 2cm x 2.5cm with traumatic (bleeding beneath tissue) extension to the other metatarsal heads 3cm. 4.  Partial thickness tissue loss at left buttock and left posterior thigh from moisture and also in gluteal fold.   5.  There is intertriginous dermatitis in the inframammary space and beneath the abdominal panus. Wound bed:As described above. Drainage (amount, consistency, odor) Except for the left foot after exploration of the wound depth where this is some bleeding from friable tissue (indicative of infection), there is serous exudate from all other open areas.  Hygiene is of concern and bathing will be a prioroty for the nursing staff today.  I havae ordered a bariatric bed with a low air loss overlay to address body habitus and moisture.  Patient will require turning and repositioning at least every two hours. Periwound:intact, macerated Dressing procedure/placement/frequency:I will implement conservative therapies to the wounds on the feet (saline, twice daily and a pressure redistribution boot) and moisture barrier cream to the open areas on the buttocks and posterior thigh.  I will  provide an antimicrobial textile to the areas impacted by inertriginous dermatitis and moisture. WOC nursing team will not follow, but will remain available to this patient, the nursing and medical team.  Please re-consult if needed. Thanks, Ladona Mow, MSN, RN, GNP, [place], [facility] [phone])

## 2013-07-25 NOTE — Progress Notes (Addendum)
[facility] PROGRESS NOTE  Adrian Khan ZOX:[id number] DOB: 06/27/1976 DOA: 07/24/2013 PCP: Jeri Cos, MD  Assessment/Plan: 1. Sepsis/L foot ulcer -concern for bone involvement per Xray, unable to check MRI due to weight limitations -Continue IV Vanc/Zosyn -Ortho Consult, may need debridement, d/w Dr.Duda -has necrotic area on R foot metatarsal head too  2. Morbid obesity -biggest challenge to his Health, d/w Mother and pt  3. Uncontrolled DM -Hbaic 13.7 -start lantus BID, SSI -Dm coordinator consult  4. ARF on CKD -last baseline 1.3, likely from sepsis and NSAID use -hydrate, monitor, stop Ibuprofen -Ct without evidence of hydronephrosis  DVT proph: Hep SQ  Code Status: Full Family Communication: d/w mother at bedside Disposition Plan: to be determined   Consultants:  ORtho Dr.Duda  Antibiotics:  VAnc/Zosyn  HPI/Subjective: C/o pain in his back, L shoulder and L foot  Objective: Filed Vitals:   07/25/13 0100  BP: 146/78  Pulse: 101  Temp: 98.4 F (36.9 C)  Resp: 20    Intake/Output Summary (Last 24 hours) at 07/25/13 1044 Last data filed at 07/25/13 8119  Gross per 24 hour  Intake 845.84 ml  Output      0 ml  Net 845.84 ml   Filed Weights   07/25/13 0551  Weight: 187 kg (412 lb 4.2 oz)    Exam:   General: AAOx3, morbidly obese  Cardiovascular: S1S2/RRR  Respiratory: CTAB  Abdomen: soft, obese, with multiple folds  Musculoskeletal: 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, R foot base of 5th MTJ with necrotic toe   Sacrum with skin breakdown, multiple small pressure ulcers  Data Reviewed: Basic Metabolic Panel:  Recent Labs Lab 07/24/13 2105 07/25/13 0544  NA 125* 126*  K 4.5 4.7  CL 92* 94*  CO2 17* 18*  GLUCOSE 399* 384*  BUN 60* 62*  CREATININE 2.28* 2.45*  CALCIUM 8.8 8.4   Liver Function Tests:  Recent Labs Lab 07/24/13 2142 07/25/13 0544  AST 14 12  ALT 8 7  ALKPHOS 149* 140*   BILITOT 0.4 0.5  PROT 8.5* 7.5  ALBUMIN 1.3* 1.1*   No results found for this basename: LIPASE, AMYLASE,  in the last 168 hours No results found for this basename: AMMONIA,  in the last 168 hours CBC:  Recent Labs Lab 07/24/13 2105 07/25/13 0544  WBC 32.4* 40.6*  HGB 9.8* 8.3*  HCT 29.7* 25.5*  MCV 81.8 82.3  PLT 481* 457*   Cardiac Enzymes: No results found for this basename: CKTOTAL, CKMB, CKMBINDEX, TROPONINI,  in the last 168 hours BNP (last 3 results) No results found for this basename: PROBNP,  in the last 8760 hours CBG:  Recent Labs Lab 07/25/13 0207 07/25/13 0745  GLUCAP 308* 365*    Recent Results (from the past 240 hour(s))  MRSA PCR SCREENING     Status: None   Collection Time    07/25/13  2:49 AM      Result Value Range Status   MRSA by PCR NEGATIVE  NEGATIVE Final   Comment:            The GeneXpert MRSA Assay (FDA     approved for NASAL specimens     only), is one component of a     comprehensive MRSA colonization     surveillance program. It is not     intended to diagnose MRSA     infection nor to guide or     monitor treatment for  MRSA infections.     Studies: Ct Abdomen Pelvis Wo Contrast  07/25/2013   *RADIOLOGY REPORT*  Clinical Data: Back pain and shoulder pain.  CT ABDOMEN AND PELVIS WITHOUT CONTRAST  Technique:  Multidetector CT imaging of the abdomen and pelvis was performed following the standard protocol without intravenous contrast.  Comparison: Lumbar spine radiographs performed earlier today at 08:43 p.m.  Findings: Minimal bibasilar atelectasis is noted; the lung bases are not well assessed due to motion artifact.  The liver and spleen are unremarkable in appearance.  There is vicarious excretion of contrast within the gallbladder.  The gallbladder is unremarkable in appearance.  The pancreas and adrenal glands are unremarkable.  The kidneys are unremarkable in appearance.  There is no evidence of hydronephrosis.  No renal or  ureteral stones are seen.  No perinephric stranding is appreciated.  No free fluid is identified.  The small bowel is unremarkable in appearance.  The stomach is within normal limits.  No acute vascular abnormalities are seen.  The appendix is normal in caliber, without evidence for appendicitis.  The colon is unremarkable in appearance.  The bladder is mildly distended and grossly unremarkable in appearance.  The prostate remains normal in size.  No inguinal lymphadenopathy is seen.  No acute osseous abnormalities are identified.  Mild vacuum phenomenon is noted at the left sacroiliac joint.  IMPRESSION: No acute abnormality seen in the abdomen or pelvis.   Original Report Authenticated By: Tonia Ghent, M.D.   Dg Chest 1 View  07/24/2013   CLINICAL DATA:  Short of breath, back pain  EXAM: CHEST - 1 VIEW Unable to obtain lateral view secondary to patient body habitus.  COMPARISON:  Prior chest x-ray 09/12/2012  FINDINGS: Cardiomegaly with left ventricular prominence. Pulmonary vascular congestion without overt edema. No large pleural effusion. No pneumothorax. No acute osseous abnormality.  IMPRESSION: Limited study secondary to patient body habitus and inability to obtain a lateral view.  Mild cardiomegaly with left ventricular prominence and pulmonary vascular congestion without overt edema.   Electronically Signed   By: Malachy Moan M.D.   On: 07/24/2013 23:13   Dg Lumbar Spine Complete  07/24/2013   CLINICAL DATA:  Low back pain  EXAM: LUMBAR SPINE - COMPLETE 4+ VIEW  COMPARISON:  11/12/2005  FINDINGS: Limited mobility for imaging due to pain. Prominent stool throughout the colon favors constipation. Body habitus reduces diagnostic sensitivity and specificity. There is reduced intervertebral disc height at L5-S1. No fracture or acute subluxation in the lumbar spine is observed.  IMPRESSION: 1. No fracture or acute subluxation. 2. Reduced intervertebral disc height at L5-S1 suggesting degenerative  disk disease. 3.  Prominent stool throughout the colon favors constipation. 4. Reduced sensitivity due to limited positioning mobility and body habitus.   Electronically Signed   By: Herbie Baltimore M.D.   On: 07/24/2013 21:09   Dg Shoulder Left  07/24/2013   CLINICAL DATA:  Left shoulder pain  EXAM: LEFT SHOULDER - 2+ VIEW  COMPARISON:  CT scan from 07/18/2013  FINDINGS: Suboptimal positioning due to the mobility, body habitus, and patient's discomfort.  Linear lucency in the acromion suggests os acromiale or less likely acromial fracture. Acromion clavicular distance measured at 1.1 cm, mildly abnormally widened. Negative predictive value of today's exam for shoulder dislocation is low.  IMPRESSION: 1. Potential AC joint widening with suspected os acromiale or acromial fracture. Correlate with point tenderness over the [facility] joint. 2. Reduced sensitivity from difficulty positioning.   Electronically Signed  By: Herbie Baltimore M.D.   On: 07/24/2013 21:14   Dg Foot 2 Views Left  07/24/2013   CLINICAL DATA:  Ulceration at the base of the foot; concern for osteomyelitis.  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 A large ulceration is noted along the plantar surface of the foot, apparently extending to the osseous structures. Underlying osteomyelitis cannot be excluded.  Visualized joint spaces are grossly preserved.  IMPRESSION: Large ulceration along the plantar aspect of the left foot, apparently extending to the osseous structures. Underlying osteomyelitis cannot be excluded. MRI could be considered for further evaluation, when and as deemed clinically appropriate.   Electronically Signed   By: Roanna Raider M.D.   On: 07/24/2013 23:29    Scheduled Meds: . sodium chloride   Intravenous STAT  . heparin  5,000 Units Subcutaneous Q8H  . insulin aspart  0-20 Units  Subcutaneous TID WC  . insulin aspart  0-5 Units Subcutaneous QHS  . insulin glargine  20 Units Subcutaneous BID  . Insulin Pen Starter Kit  1 kit Other Once  . nystatin cream   Topical BID  . piperacillin-tazobactam (ZOSYN)  IV  3.375 g Intravenous Q8H  . vancomycin  2,000 mg Intravenous Q24H   Continuous Infusions: . sodium chloride 100 mL/hr at 07/25/13 0145    Principal Problem:   Sepsis Active Problems:   DIABETES, TYPE 2   Morbid obesity, BMI unknown   Diabetic foot ulcer   ARF (acute renal failure)    Time spent:    [facility]  [facility] Pager [phone]. If 7PM-7AM, please contact night-coverage at [email or web address], password [facility] 07/25/2013, 10:44 AM  LOS: 1 day

## 2013-07-25 NOTE — Progress Notes (Signed)
ANTIBIOTIC CONSULT NOTE   Pharmacy Consult for Vancomycin and Zosyn  Indication: diabetic foot ulcer, r/o osteo  Patient Measurements: Height: 5\' 11"  (180.3 cm) Weight: 412 lb 4.2 oz (187 kg) IBW/kg (Calculated) : 75.3  Labs:  Recent Labs  07/24/13 2105 07/25/13 0544  WBC 32.4* 40.6*  HGB 9.8* 8.3*  PLT 481* 457*  CREATININE 2.28* 2.45*   Estimated Creatinine Clearance: 72.1 ml/min (by C-G formula based on Cr of 2.45).   Anti-infectives: 07/25/2013 >> vanc >> 07/25/2013 >>zosyn >>  Assessment: 37 yo M admitted on 10/7 with c/o generalized pain, bound to have WBC > 30 and infected left foot. PMH includes partial left foot amputation. Xray of foot could not r/o osteomyelitis. Pharmacy asked to dose vanc and zosyn.  SCr increased 2.28 > 2.45, CrCl ~ 43 ml/min Normalized  WBC increased to 40.6  Cultures pending  Goal of Therapy:  Vancomycin trough level 15-20 mcg/ml Appropriate abx dosing, eradication of infection.   Plan:   Continue Zosyn 3.375g IV Q8H infused over 4hrs.  Decrease to Vancomycin 2g IV q24h.  Measure Vanc trough at steady state.  Follow up renal fxn and culture results.   Lynann Beaver PharmD, BCPS Pager [phone] 07/25/2013 9:55 AM

## 2013-07-25 NOTE — Consult Note (Signed)
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 Referring Physician: Dr. Blase Mess is an 37 y.o. male.  HPI: Patient is a 37 year old gentleman morbidly obese uncontrolled type 2 diabetes who presents complaining of acute left shoulder pain. Denies any specific trauma. Patient also uses both of his feet to move himself in bed and has early ulceration to the right lower extremity and advanced ulceration to the left lower extremity.  Past Medical History  Diagnosis Date  . Shortness of breath     with exertion  . PONV (postoperative nausea and vomiting)   . Diabetes mellitus without complication   . Anxiety   . Mental disorder   . Depression   . Sleep apnea     done at [facility].  Marland Kitchen Pneumonia   . GERD (gastroesophageal reflux disease)   . Constipation   . Neuropathy     feet, hands  . Anginal pain     chest pressure has increased in the past few days; patient denied  on 09/20/12    Past Surgical History  Procedure Laterality Date  . I&d extremity  12/22/2011    Procedure: IRRIGATION AND DEBRIDEMENT EXTREMITY;  Surgeon: Kennieth Rad, MD;  Location: [facility] OR;  Service: Orthopedics;  Laterality: Left;  . I&d extremity  12/24/2011    Procedure: IRRIGATION AND DEBRIDEMENT EXTREMITY;  Surgeon: Kennieth Rad, MD;  Location: [facility] OR;  Service: Orthopedics;  Laterality: Left;  Irrigation and debridement left foot.  . I&d extremity  12/26/2011    Procedure: IRRIGATION AND DEBRIDEMENT EXTREMITY;  Surgeon: Kennieth Rad, MD;  Location: [facility] OR;  Service: Orthopedics;  Laterality: Left;  . I&d extremity  12/28/2011    Procedure: IRRIGATION AND DEBRIDEMENT EXTREMITY;  Surgeon: Kennieth Rad, MD;  Location: [facility] OR;  Service: Orthopedics;  Laterality: Left;  I&D Left Foot;Application of  Wound Vac.to dorsal and plantar areas of left foot  . Eye surgery    . Toe surgery      Bone removed- 2 toe right  . Wisdom tooth extraction    . Amputation   09/22/2012    Procedure: AMPUTATION FOOT;  Surgeon: Nadara Mustard, MD;  Location: [facility] OR;  Service: Orthopedics;  Laterality: Left;  Left Midfoot Amputation    History reviewed. No pertinent family history.  Social History:  reports that he has never smoked. He has never used smokeless tobacco. He reports that he does not drink alcohol or use illicit drugs.  Allergies:  Allergies  Allergen Reactions  . Sulfonamide Derivatives Other (See Comments)    cramps    Medications: I have reviewed the patient's current medications.  Results for orders placed during the hospital encounter of 07/24/13 (from the past 48 hour(s))  CBC     Status: Abnormal   Collection Time    07/24/13  9:05 PM      Result Value Range   WBC 32.4 (*) 4.0 - 10.5 K/uL   Comment: REPEATED TO VERIFY   RBC 3.63 (*) 4.22 - 5.81 MIL/uL   Hemoglobin 9.8 (*) 13.0 - 17.0 g/dL   HCT 95.6 (*) 21.3 - 08.6 %   MCV 81.8  78.0 - 100.0 fL   MCH 27.0  26.0 - 34.0 pg   MCHC 33.0  30.0 - 36.0 g/dL   RDW 57.8  46.9 - 62.9 %   Platelets 481 (*) 150 - 400 K/uL  BASIC METABOLIC PANEL     Status: Abnormal  Collection Time    07/24/13  9:05 PM      Result Value Range   Sodium 125 (*) 135 - 145 mEq/L   Potassium 4.5  3.5 - 5.1 mEq/L   Chloride 92 (*) 96 - 112 mEq/L   CO2 17 (*) 19 - 32 mEq/L   Glucose, Bld 399 (*) 70 - 99 mg/dL   BUN 60 (*) 6 - 23 mg/dL   Creatinine, Ser 9.60 (*) 0.50 - 1.35 mg/dL   Calcium 8.8  8.4 - 45.4 mg/dL   GFR calc non Af Amer 36 (*) >90 mL/min   GFR calc Af Amer 41 (*) >90 mL/min   Comment: (NOTE)     The eGFR has been calculated using the CKD EPI equation.     This calculation has not been validated in all clinical situations.     eGFR's persistently <90 mL/min signify possible Chronic Kidney     Disease.  HEPATIC FUNCTION PANEL     Status: Abnormal   Collection Time    07/24/13  9:42 PM      Result Value Range   Total Protein 8.5 (*) 6.0 - 8.3 g/dL   Albumin 1.3 (*) 3.5 - 5.2 g/dL   AST 14   0 - 37 U/L   ALT 8  0 - 53 U/L   Alkaline Phosphatase 149 (*) 39 - 117 U/L   Total Bilirubin 0.4  0.3 - 1.2 mg/dL   Bilirubin, Direct 0.1  0.0 - 0.3 mg/dL   Comment: SLIGHT HEMOLYSIS   Indirect Bilirubin 0.3  0.3 - 0.9 mg/dL  CG4 I-STAT (LACTIC ACID)     Status: None   Collection Time    07/24/13 10:29 PM      Result Value Range   Lactic Acid, Venous 1.41  0.5 - 2.2 mmol/L  URINALYSIS, ROUTINE W REFLEX MICROSCOPIC     Status: Abnormal   Collection Time    07/24/13 11:21 PM      Result Value Range   Color, Urine YELLOW  YELLOW   APPearance CLOUDY (*) CLEAR   Specific Gravity, Urine 1.031 (*) 1.005 - 1.030   pH 5.5  5.0 - 8.0   Glucose, UA >1000 (*) NEGATIVE mg/dL   Hgb urine dipstick LARGE (*) NEGATIVE   Bilirubin Urine NEGATIVE  NEGATIVE   Ketones, ur NEGATIVE  NEGATIVE mg/dL   Protein, ur >098 (*) NEGATIVE mg/dL   Urobilinogen, UA 1.0  0.0 - 1.0 mg/dL   Nitrite NEGATIVE  NEGATIVE   Leukocytes, UA NEGATIVE  NEGATIVE  URINE MICROSCOPIC-ADD ON     Status: None   Collection Time    07/24/13 11:21 PM      Result Value Range   Squamous Epithelial / LPF RARE  RARE   WBC, UA 3-6  <3 WBC/hpf   RBC / HPF 11-20  <3 RBC/hpf   Bacteria, UA RARE  RARE   Urine-Other AMORPHOUS URATES/PHOSPHATES     Comment: LESS THAN 10 mL OF URINE SUBMITTED  GLUCOSE, CAPILLARY     Status: Abnormal   Collection Time    07/25/13  2:07 AM      Result Value Range   Glucose-Capillary 308 (*) 70 - 99 mg/dL   Comment 1 Notify RN    MRSA PCR SCREENING     Status: None   Collection Time    07/25/13  2:49 AM      Result Value Range   MRSA by PCR NEGATIVE  NEGATIVE   Comment:  The GeneXpert MRSA Assay (FDA     approved for NASAL specimens     only), is one component of a     comprehensive MRSA colonization     surveillance program. It is not     intended to diagnose MRSA     infection nor to guide or     monitor treatment for     MRSA infections.  CBC     Status: Abnormal   Collection  Time    07/25/13  5:44 AM      Result Value Range   WBC 40.6 (*) 4.0 - 10.5 K/uL   Comment: REPEATED TO VERIFY   RBC 3.10 (*) 4.22 - 5.81 MIL/uL   Hemoglobin 8.3 (*) 13.0 - 17.0 g/dL   HCT 16.1 (*) 09.6 - 04.5 %   MCV 82.3  78.0 - 100.0 fL   MCH 26.8  26.0 - 34.0 pg   MCHC 32.5  30.0 - 36.0 g/dL   RDW 40.9  81.1 - 91.4 %   Platelets 457 (*) 150 - 400 K/uL  COMPREHENSIVE METABOLIC PANEL     Status: Abnormal   Collection Time    07/25/13  5:44 AM      Result Value Range   Sodium 126 (*) 135 - 145 mEq/L   Potassium 4.7  3.5 - 5.1 mEq/L   Chloride 94 (*) 96 - 112 mEq/L   CO2 18 (*) 19 - 32 mEq/L   Glucose, Bld 384 (*) 70 - 99 mg/dL   BUN 62 (*) 6 - 23 mg/dL   Creatinine, Ser 7.82 (*) 0.50 - 1.35 mg/dL   Calcium 8.4  8.4 - 95.6 mg/dL   Total Protein 7.5  6.0 - 8.3 g/dL   Albumin 1.1 (*) 3.5 - 5.2 g/dL   AST 12  0 - 37 U/L   ALT 7  0 - 53 U/L   Alkaline Phosphatase 140 (*) 39 - 117 U/L   Total Bilirubin 0.5  0.3 - 1.2 mg/dL   GFR calc non Af Amer 33 (*) >90 mL/min   GFR calc Af Amer 38 (*) >90 mL/min   Comment: (NOTE)     The eGFR has been calculated using the CKD EPI equation.     This calculation has not been validated in all clinical situations.     eGFR's persistently <90 mL/min signify possible Chronic Kidney     Disease.  HEMOGLOBIN A1C     Status: Abnormal   Collection Time    07/25/13  5:44 AM      Result Value Range   Hemoglobin A1C 13.7 (*) <5.7 %   Comment: (NOTE)                                                                               According to the ADA Clinical Practice Recommendations for 2011, when     HbA1c is used as a screening test:      >=6.5%   Diagnostic of Diabetes Mellitus               (if abnormal result is confirmed)     5.7-6.4%   Increased risk of developing Diabetes Mellitus  References:Diagnosis and Classification of Diabetes Mellitus,Diabetes     Care,2011,34(Suppl 1):S62-S69 and Standards of Medical Care in             Diabetes  - 2011,Diabetes Care,2011,34 (Suppl 1):S11-S61.   Mean Plasma Glucose 346 (*) <117 mg/dL   Comment: Performed at [facility]  GLUCOSE, CAPILLARY     Status: Abnormal   Collection Time    07/25/13  7:45 AM      Result Value Range   Glucose-Capillary 365 (*) 70 - 99 mg/dL   Comment 1 Notify RN    GLUCOSE, CAPILLARY     Status: Abnormal   Collection Time    07/25/13 11:12 AM      Result Value Range   Glucose-Capillary 311 (*) 70 - 99 mg/dL   Comment 1 Notify RN    GLUCOSE, CAPILLARY     Status: Abnormal   Collection Time    07/25/13  4:29 PM      Result Value Range   Glucose-Capillary 154 (*) 70 - 99 mg/dL    Ct Abdomen Pelvis Wo Contrast  07/25/2013   *RADIOLOGY REPORT*  Clinical Data: Back pain and shoulder pain.  CT ABDOMEN AND PELVIS WITHOUT CONTRAST  Technique:  Multidetector CT imaging of the abdomen and pelvis was performed following the standard protocol without intravenous contrast.  Comparison: Lumbar spine radiographs performed earlier today at 08:43 p.m.  Findings: Minimal bibasilar atelectasis is noted; the lung bases are not well assessed due to motion artifact.  The liver and spleen are unremarkable in appearance.  There is vicarious excretion of contrast within the gallbladder.  The gallbladder is unremarkable in appearance.  The pancreas and adrenal glands are unremarkable.  The kidneys are unremarkable in appearance.  There is no evidence of hydronephrosis.  No renal or ureteral stones are seen.  No perinephric stranding is appreciated.  No free fluid is identified.  The small bowel is unremarkable in appearance.  The stomach is within normal limits.  No acute vascular abnormalities are seen.  The appendix is normal in caliber, without evidence for appendicitis.  The colon is unremarkable in appearance.  The bladder is mildly distended and grossly unremarkable in appearance.  The prostate remains normal in size.  No inguinal lymphadenopathy is seen.  No acute osseous  abnormalities are identified.  Mild vacuum phenomenon is noted at the left sacroiliac joint.  IMPRESSION: No acute abnormality seen in the abdomen or pelvis.   Original Report Authenticated By: Tonia Ghent, M.D.   Dg Chest 1 View  07/24/2013   CLINICAL DATA:  Short of breath, back pain  EXAM: CHEST - 1 VIEW Unable to obtain lateral view secondary to patient body habitus.  COMPARISON:  Prior chest x-ray 09/12/2012  FINDINGS: Cardiomegaly with left ventricular prominence. Pulmonary vascular congestion without overt edema. No large pleural effusion. No pneumothorax. No acute osseous abnormality.  IMPRESSION: Limited study secondary to patient body habitus and inability to obtain a lateral view.  Mild cardiomegaly with left ventricular prominence and pulmonary vascular congestion without overt edema.   Electronically Signed   By: Malachy Moan M.D.   On: 07/24/2013 23:13   Dg Lumbar Spine Complete  07/24/2013   CLINICAL DATA:  Low back pain  EXAM: LUMBAR SPINE - COMPLETE 4+ VIEW  COMPARISON:  11/12/2005  FINDINGS: Limited mobility for imaging due to pain. Prominent stool throughout the colon favors constipation. Body habitus reduces diagnostic sensitivity and specificity. There is reduced intervertebral disc height at L5-S1. No fracture  or acute subluxation in the lumbar spine is observed.  IMPRESSION: 1. No fracture or acute subluxation. 2. Reduced intervertebral disc height at L5-S1 suggesting degenerative disk disease. 3.  Prominent stool throughout the colon favors constipation. 4. Reduced sensitivity due to limited positioning mobility and body habitus.   Electronically Signed   By: Herbie Baltimore M.D.   On: 07/24/2013 21:09   Dg Shoulder Left  07/24/2013   CLINICAL DATA:  Left shoulder pain  EXAM: LEFT SHOULDER - 2+ VIEW  COMPARISON:  CT scan from 07/18/2013  FINDINGS: Suboptimal positioning due to the mobility, body habitus, and patient's discomfort.  Linear lucency in the acromion suggests os  acromiale or less likely acromial fracture. Acromion clavicular distance measured at 1.1 cm, mildly abnormally widened. Negative predictive value of today's exam for shoulder dislocation is low.  IMPRESSION: 1. Potential AC joint widening with suspected os acromiale or acromial fracture. Correlate with point tenderness over the [facility] joint. 2. Reduced sensitivity from difficulty positioning.   Electronically Signed   By: Herbie Baltimore M.D.   On: 07/24/2013 21:14   Dg Foot 2 Views Left  07/24/2013   CLINICAL DATA:  Ulceration at the base of the foot; concern for osteomyelitis.  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 A large ulceration is noted along the plantar surface of the foot, apparently extending to the osseous structures. Underlying osteomyelitis cannot be excluded.  Visualized joint spaces are grossly preserved.  IMPRESSION: Large ulceration along the plantar aspect of the left foot, apparently extending to the osseous structures. Underlying osteomyelitis cannot be excluded. MRI could be considered for further evaluation, when and as deemed clinically appropriate.   Electronically Signed   By: Roanna Raider M.D.   On: 07/24/2013 23:29    Review of Systems  All other systems reviewed and are negative.   Blood pressure 119/76, pulse 89, temperature 98 F (36.7 C), temperature source Oral, resp. rate 20, height 5\' 11"  (1.803 m), weight 185.3 kg (408 lb 8.2 oz), SpO2 100.00%. Physical Exam Review of the left shoulder patient has pain over the clavicle. He has no mechanical injury. He has global tenderness to palpation radiographs shows no bony abnormalities. Examination of both feet his left transmetatarsal amputation is a large ulcer over the plantar lateral aspect proximally 6 cm in diameter and 1 cm deep. There is necrotic fascial tissue which was debrided from  the wound and patient had good beefy granulation tissue at treatment. Examination of right foot he is currently on blistering over the plantar lateral aspect of the right foot and is at risk of skin breakdown this area as well. Assessment/Plan: Assessment: Left shoulder pain rule out gout. Wagner grade 1 ulcer left foot. Early ulceration right foot.   Plan: Will plan to start Silvadene dressing changes to the left foot with daily.also cleansing. He will have foam boots applied to both feet to protect them. Will order a uric acid level to see if gout is the cause of the left shoulder pain. Do not see any surgical interventions that could be helpful for the left shoulder at this time. I will followup after discharge in the office.  DUDA,MARCUS V 07/25/2013, 5:53 PM

## 2013-07-25 NOTE — Progress Notes (Signed)
ANTIBIOTIC CONSULT NOTE - INITIAL  Pharmacy Consult for Vancomycin and Zosyn  Indication: diabetic foot ulcer, r/o osteo  Allergies  Allergen Reactions  . Sulfonamide Derivatives Other (See Comments)    cramps    Patient Measurements: Height: 5\' 11"  (180.3 cm) Weight: 412 lb 4.2 oz (187 kg) IBW/kg (Calculated) : 75.3 Adjusted Body Weight:   Vital Signs: Temp: 98.4 F (36.9 C) (10/08 0100) Temp src: Oral (10/08 0100) BP: 146/78 mmHg (10/08 0100) Pulse Rate: 101 (10/08 0100) Intake/Output from previous day: 10/07 0701 - 10/08 0700 In: 695.8 [I.V.:145.8; IV Piggyback:550] Out: -  Intake/Output from this shift: Total I/O In: 695.8 [I.V.:145.8; IV Piggyback:550] Out: -   Labs:  Recent Labs  07/24/13 2105  WBC 32.4*  HGB 9.8*  PLT 481*  CREATININE 2.28*   Estimated Creatinine Clearance: 77.5 ml/min (by C-G formula based on Cr of 2.28). No results found for this basename: VANCOTROUGH, Leodis Binet, VANCORANDOM, GENTTROUGH, GENTPEAK, GENTRANDOM, TOBRATROUGH, TOBRAPEAK, TOBRARND, AMIKACINPEAK, AMIKACINTROU, AMIKACIN,  in the last 72 hours   Microbiology: Recent Results (from the past 720 hour(s))  MRSA PCR SCREENING     Status: None   Collection Time    07/25/13  2:49 AM      Result Value Range Status   MRSA by PCR NEGATIVE  NEGATIVE Final   Comment:            The GeneXpert MRSA Assay (FDA     approved for NASAL specimens     only), is one component of a     comprehensive MRSA colonization     surveillance program. It is not     intended to diagnose MRSA     infection nor to guide or     monitor treatment for     MRSA infections.    Medical History: Past Medical History  Diagnosis Date  . Shortness of breath     with exertion  . PONV (postoperative nausea and vomiting)   . Diabetes mellitus without complication   . Anxiety   . Mental disorder   . Depression   . Sleep apnea     done at [facility].  Marland Kitchen Pneumonia   . GERD (gastroesophageal reflux disease)   .  Constipation   . Neuropathy     feet, hands  . Anginal pain     chest pressure has increased in the past few days; patient denied  on 09/20/12    Medications:  Anti-infectives   Start     Dose/Rate Route Frequency Ordered Stop   07/25/13 1000  vancomycin (VANCOCIN) 1,500 mg in sodium chloride 0.9 % 500 mL IVPB     1,500 mg 250 mL/hr over 120 Minutes Intravenous Every 12 hours 07/25/13 0553     07/25/13 0600  piperacillin-tazobactam (ZOSYN) IVPB 3.375 g     3.375 g 12.5 mL/hr over 240 Minutes Intravenous 3 times per day 07/25/13 0553     07/24/13 2230  piperacillin-tazobactam (ZOSYN) IVPB 3.375 g     3.375 g 100 mL/hr over 30 Minutes Intravenous  Once 07/24/13 2226 07/25/13 0100   07/24/13 2230  vancomycin (VANCOCIN) 1,500 mg in sodium chloride 0.9 % 500 mL IVPB     1,500 mg 250 mL/hr over 120 Minutes Intravenous  Once 07/24/13 2226 07/25/13 0300     Assessment: Patient with diabetic foot ulcer and possible osteo.  First dose of antibiotics already given in ED.  Goal of Therapy:  Vancomycin trough level 15-20 mcg/ml Zosyn based on renal function  Plan:  Measure antibiotic drug levels at steady state Follow up culture results Vancomycin 1500mg  iv q12hr Zosyn 3.375g IV Q8H infused over 4hrs.   Darlina Guys, Jacquenette Shone Crowford 07/25/2013,5:53 AM

## 2013-07-25 NOTE — Progress Notes (Signed)
Inpatient Diabetes Program Recommendations  AACE/ADA: New Consensus Statement on Inpatient Glycemic Control (2013)  Target Ranges:  Prepandial:   less than 140 mg/dL      Peak postprandial:   less than 180 mg/dL (1-2 hours)      Critically ill patients:  140 - 180 mg/dL   Reason for Visit: Diabetes Consult  Pt states he has been on insulin in the past, but recently just on Januvia 100 mg QD. States he checks blood sugars at home and they have been running high.  Does not follow diabetic diet.  Results for Adrian, Khan (MRN [id number]) as of 07/25/2013 17:02  Ref. Range 07/25/2013 02:07 07/25/2013 07:45 07/25/2013 11:12 07/25/2013 16:29  Glucose-Capillary Latest Range: 70-99 mg/dL 409 (H) 811 (H) 914 (H) 154 (H)  Results for Adrian, Khan (MRN [id number]) as of 07/25/2013 17:02  Ref. Range 07/25/2013 05:44  Hemoglobin A1C Latest Range: <5.7 % 13.7 (H)    Inpatient Diabetes Program Recommendations Insulin - Basal: Started on Lantus 20 units bid this morning Correction (SSI): Novolog resistant tidwc and hs Oral Agents: PTA was on Januvia 100 mg QD HgbA1C: 13.7% Will order OP Diabetes Education consult for uncontrolled DM and weight-loss. Will order Living Well With Diabetes book and encouraged pt to view diabetes videos on pt ed channel.  Emphasized importance of weight loss and glycemic control to reduce risks of complications.    Will f/u with pt and Mom tomorrow am. Discussed above with RN. Will likely need to titrate basal insulin and add meal coverage insulin.  Thank you. Ailene Ards, RD, LDN, CDE Inpatient Diabetes Coordinator [phone]

## 2013-07-26 DIAGNOSIS — R509 Fever, unspecified: Secondary | ICD-10-CM

## 2013-07-26 DIAGNOSIS — D638 Anemia in other chronic diseases classified elsewhere: Secondary | ICD-10-CM

## 2013-07-26 LAB — GLUCOSE, CAPILLARY
Glucose-Capillary: 207 mg/dL — ABNORMAL HIGH (ref 70–99)
Glucose-Capillary: 211 mg/dL — ABNORMAL HIGH (ref 70–99)
Glucose-Capillary: 181 mg/dL — ABNORMAL HIGH (ref 70–99)

## 2013-07-26 LAB — BASIC METABOLIC PANEL
BUN: 67 mg/dL — ABNORMAL HIGH (ref 6–23)
CO2: 14 mEq/L — ABNORMAL LOW (ref 19–32)
Creatinine, Ser: 2.95 mg/dL — ABNORMAL HIGH (ref 0.50–1.35)
GFR calc Af Amer: 30 mL/min — ABNORMAL LOW (ref >90)
GFR calc non Af Amer: 26 mL/min — ABNORMAL LOW (ref >90)

## 2013-07-26 LAB — CBC
HCT: 26.3 % — ABNORMAL LOW (ref 39.0–52.0)
MCHC: 33.1 g/dL (ref 30.0–36.0)
MCV: 81.7 fL (ref 78.0–100.0)
RDW: 13.3 % (ref 11.5–15.5)

## 2013-07-26 MED ORDER — DEXTROSE 5 % IV SOLN
2.0000 g | INTRAVENOUS | Status: DC
  Administered 2013-07-26 – 2013-08-02 (×8): 2 g via INTRAVENOUS
  Filled 2013-07-26 (×8): qty 2

## 2013-07-26 MED ORDER — DEXTROSE 250 MG/ML IV SOLN
25.0000 g | Freq: Once | INTRAVENOUS | Status: DC
  Filled 2013-07-26: qty 100

## 2013-07-26 MED ORDER — CEFAZOLIN SODIUM-DEXTROSE 2-3 GM-% IV SOLR
2.0000 g | Freq: Two times a day (BID) | INTRAVENOUS | Status: DC
  Filled 2013-07-26 (×2): qty 50

## 2013-07-26 MED ORDER — DEXTROSE 50 % IV SOLN: 50.0000 mL | Freq: Once | INTRAVENOUS | Status: DC

## 2013-07-26 MED ORDER — INSULIN GLARGINE 100 UNIT/ML ~~LOC~~ SOLN
25.0000 [IU] | Freq: Two times a day (BID) | SUBCUTANEOUS | Status: DC
  Administered 2013-07-26 – 2013-07-29 (×6): 25 [IU] via SUBCUTANEOUS
  Filled 2013-07-26 (×7): qty 0.25

## 2013-07-26 MED ORDER — INSULIN ASPART 100 UNIT/ML IV SOLN
10.0000 [IU] | Freq: Once | INTRAVENOUS | Status: AC
  Administered 2013-07-26: 10 [IU] via INTRAVENOUS
  Filled 2013-07-26: qty 0.1

## 2013-07-26 MED ORDER — SODIUM POLYSTYRENE SULFONATE 15 GM/60ML PO SUSP
15.0000 g | Freq: Once | ORAL | Status: AC
  Administered 2013-07-26: 15 g via ORAL
  Filled 2013-07-26: qty 60

## 2013-07-26 MED ORDER — DEXTROSE 50 % IV SOLN
25.0000 mL | Freq: Once | INTRAVENOUS | Status: AC
  Administered 2013-07-26: 25 mL via INTRAVENOUS
  Filled 2013-07-26: qty 50

## 2013-07-26 NOTE — ED Provider Notes (Signed)
Medical screening examination/treatment/procedure(s) were performed by non-physician practitioner and as supervising physician I was immediately available for consultation/collaboration.  Toy Baker, MD 07/26/13 1357

## 2013-07-26 NOTE — Progress Notes (Signed)
CARE MANAGEMENT NOTE 07/26/2013  Patient:  Adrian Khan, Adrian Khan   Account Number:  [id number]  Date Initiated:  07/25/2013  Documentation initiated by:  [facility]  Subjective/Objective Assessment:   37 year old male admitted with sepsis. From home with his mother.     Action/Plan:   Will likely need HH servcies at d/c. Has used [facility] in the past and wishes to use them again.   Anticipated DC Date:  07/28/2013   Anticipated DC Plan:  HOME W HOME HEALTH SERVICES     DC Planning Services  CM consult       Status of service:  In process, will continue to follow Medicare Important Message given?  NA - LOS <3 / Initial given by admissions  Per UR Regulation:  Reviewed for med. necessity/level of care/duration of stay  Comments:  07/26/13 [name] RN BSN [phone] I met with pt to discuss need for PCP. He asked me to speak to him mother. He does not know her phone # and te one we have in the system is disconected. I asked the bedside RN to call me when she visits so that I can with speak with her then.

## 2013-07-26 NOTE — Progress Notes (Signed)
ANTIBIOTIC CONSULT NOTE - INITIAL  Pharmacy Consult for cefazolin Indication: gram-positive bacteremia; sepsis; L foot ulcer  Allergies  Allergen Reactions  . Sulfonamide Derivatives Other (See Comments)    cramps    Patient Measurements: Height: 5\' 11"  (180.3 cm) Weight: 408 lb 8.2 oz (185.3 kg) IBW/kg (Calculated) : 75.3  Vital Signs: Temp: 98.2 F (36.8 C) (10/09 0501) Temp src: Oral (10/09 0501) BP: 136/84 mmHg (10/09 0501) Pulse Rate: 91 (10/09 0501) Intake/Output from previous day: 10/08 0701 - 10/09 0700 In: 270 [P.O.:220; IV Piggyback:50] Out: 600 [Urine:600] Intake/Output from this shift:    Labs:  Recent Labs  07/24/13 2105 07/25/13 0544 07/26/13 0525  WBC 32.4* 40.6* 40.4*  HGB 9.8* 8.3* 8.7*  PLT 481* 457* 463*  CREATININE 2.28* 2.45* 2.95*   Estimated Creatinine Clearance: 59.5 ml/min (by C-G formula based on Cr of 2.95). Estimated Normalized Creatinine Clearance: 35 mL/min/72 kg.    Microbiology: Recent Results (from the past 720 hour(s))  CULTURE, BLOOD (ROUTINE X 2)     Status: None   Collection Time    07/24/13 10:10 PM      Result Value Range Status   Specimen Description BLOOD RIGHT FOREARM   Final   Special Requests BOTTLES DRAWN AEROBIC AND ANAEROBIC 3CC   Final   Culture  Setup Time     Final   Value: 07/25/2013 06:28     Performed at [facility]   Culture     Final   Value: GROUP B STREP(S.AGALACTIAE)ISOLATED     Note: Gram Stain Report Called to,Read Back By and Verified With: SHANNON YOUNG ON 07/25/2013 AT 9:53P BY [employer]     Performed at [facility]   Report Status PENDING   Incomplete  CULTURE, BLOOD (ROUTINE X 2)     Status: None   Collection Time    07/24/13 10:15 PM      Result Value Range Status   Specimen Description BLOOD RIGHT ANTECUBITAL   Final   Special Requests BOTTLES DRAWN AEROBIC AND ANAEROBIC 3CC   Final   Culture  Setup Time     Final   Value: 07/25/2013 06:27     Performed at [facility]   Culture     Final   Value: GROUP B STREP(S.AGALACTIAE)ISOLATED     Note: Gram Stain Report Called to,Read Back By and Verified With: SHANNON YOUNG ON 07/25/2013 AT 9:53P BY WILEJ     Performed at [facility]   Report Status PENDING   Incomplete  MRSA PCR SCREENING     Status: None   Collection Time    07/25/13  2:49 AM      Result Value Range Status   MRSA by PCR NEGATIVE  NEGATIVE Final   Comment:            The GeneXpert MRSA Assay (FDA     approved for NASAL specimens     only), is one component of a     comprehensive MRSA colonization     surveillance program. It is not     intended to diagnose MRSA     infection nor to guide or     monitor treatment for     MRSA infections.    Medical History: Past Medical History  Diagnosis Date  . Shortness of breath     with exertion  . PONV (postoperative nausea and vomiting)   . Diabetes mellitus without complication   . Anxiety   . Mental  disorder   . Depression   . Sleep apnea     done at [facility].  Marland Kitchen Pneumonia   . GERD (gastroesophageal reflux disease)   . Constipation   . Neuropathy     feet, hands  . Anginal pain     chest pressure has increased in the past few days; patient denied  on 09/20/12    Medications:  Scheduled:  . dextrose  25 mL Intravenous Once  . heparin  5,000 Units Subcutaneous Q8H  . insulin aspart  0-20 Units Subcutaneous TID WC  . insulin aspart  0-5 Units Subcutaneous QHS  . insulin aspart  10 Units Intravenous Once  . insulin glargine  20 Units Subcutaneous BID  . living well with diabetes book   Does not apply Once  . nystatin cream   Topical BID  . silver sulfADIAZINE   Topical Daily  . vancomycin  2,000 mg Intravenous Q24H   Infusions:  . sodium chloride 100 mL/hr at 07/25/13 0145   PRN: acetaminophen, acetaminophen, morphine injection, ondansetron (ZOFRAN) IV, ondansetron, oxyCODONE-acetaminophen  Assessment: 37 y/o M with diabetic foot infection, sepsis, gram-positive  bacteremia, morbid obesity. Preliminary blood culture results show Group B streptococci in 2 of 2 sets.  Currently on D# 2 of vancomycin 2 grams IV q24h + Zosyn 3.375 grams IV q8h (extended-infusion).  Orders received to discontinue Zosyn and begin cefazolin.   Has acute renal insufficiency superimposed on CKD. Current SCr 2.95; estimated CrCl 59 mL/min ([place]roft-Gault), 35 mL/min/72kg (weight-normalized).  Baseline SCr reportedly 1.3.   Goal of Therapy:  Eradication of infection Adjustment of antibiotic dosage for renal function  Plan:  1. Begin cefazolin 2 grams IV q12h 2. Follow serum creatinine.  Once the normalized CrCl improves to at least 50 mL/min/72 kg will plan to increase to q8h dosing. 3. Remains on vancomycin; will await guidance from ID physicians.  Elie Goody, PharmD, BCPS Pager: [phone] 07/26/2013  12:11 PM

## 2013-07-26 NOTE — Progress Notes (Addendum)
[facility] PROGRESS NOTE  Adrian Khan:[id number] DOB: [birth year] DOA: 07/24/2013 PCP: Jeri Cos, MD  Assessment/Plan: 1. Sepsis/L foot ulcer -concern for bone involvement per Xray, unable to check MRI due to weight limitations -Continue IV Vanc/Zosyn Day 2 -Ortho Consult appreciated, no need for debridement at this time per Dr.Duda -has necrotic area on R foot metatarsal head too -Blood Cx with GPC in clusters, source most likely his foot -ID consult requested, d/w Dr.VanDam  2. Morbid obesity -biggest challenge to his Health, d/w Mother and pt  3. Severe Uncontrolled DM -Hbaic 13.7 -started on  lantus BID will increase dose, SSI -Dm coordinator consult  4. ARF on CKD -last baseline 1.3, likely from sepsis and NSAID use -hydrate, monitor, stop Ibuprofen -I/Os not recorded accurately, strict I/Os, pt declines foley -Ct without evidence of hydronephrosis -IF kidney function does not improve by tomorrow will ask Renal to see  5. L shoulder pain and tenderness -had Xray 10/7 with Potential AC joint widening with suspected os acromiale or acromial fracture. -had a CT shoulder 10/1 without definite abnormality, but limited study due to body habitus, unable to check MRI scan  6. Hyperkalemia -due to ARF -insulin, dextrose, kayexalate  DVT proph: Hep SQ  Code Status: Full Family Communication: d/w mother at bedside Disposition Plan: to be determined   Consultants:  ORtho Dr.Duda  Antibiotics:  VAnc/Zosyn  HPI/Subjective: C/o pain in his back, L shoulder and L foot  Objective: Filed Vitals:   07/26/13 0501  BP: 136/84  Pulse: 91  Temp: 98.2 F (36.8 C)  Resp: 20    Intake/Output Summary (Last 24 hours) at 07/26/13 1034 Last data filed at 07/25/13 2214  Gross per 24 hour  Intake     50 ml  Output    600 ml  Net   -550 ml   Filed Weights   07/25/13 0551 07/25/13 1100  Weight: 187 kg (412 lb 4.2 oz) 185.3 kg (408 lb 8.2 oz)     Exam:   General: AAOx3, morbidly obese  Cardiovascular: S1S2/RRR  Respiratory: CTAB  Abdomen: soft, obese, with multiple folds  Musculoskeletal: 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, R foot base of 5th MTJ with necrotic toe   L shoulder with point tenderness medially and laterally  Sacrum with skin breakdown, multiple small pressure ulcers  Data Reviewed: Basic Metabolic Panel:  Recent Labs Lab 07/24/13 2105 07/25/13 0544 07/26/13 0525  NA 125* 126* 127*  K 4.5 4.7 5.8*  CL 92* 94* 97  CO2 17* 18* 14*  GLUCOSE 399* 384* 213*  BUN 60* 62* 67*  CREATININE 2.28* 2.45* 2.95*  CALCIUM 8.8 8.4 8.2*   Liver Function Tests:  Recent Labs Lab 07/24/13 2142 07/25/13 0544  AST 14 12  ALT 8 7  ALKPHOS 149* 140*  BILITOT 0.4 0.5  PROT 8.5* 7.5  ALBUMIN 1.3* 1.1*   No results found for this basename: LIPASE, AMYLASE,  in the last 168 hours No results found for this basename: AMMONIA,  in the last 168 hours CBC:  Recent Labs Lab 07/24/13 2105 07/25/13 0544 07/26/13 0525  WBC 32.4* 40.6* 40.4*  HGB 9.8* 8.3* 8.7*  HCT 29.7* 25.5* 26.3*  MCV 81.8 82.3 81.7  PLT 481* 457* 463*   Cardiac Enzymes: No results found for this basename: CKTOTAL, CKMB, CKMBINDEX, TROPONINI,  in the last 168 hours BNP (last 3 results) No results found for this basename: PROBNP,  in the last 8760 hours CBG:  Recent  Labs Lab 07/25/13 0745 07/25/13 1112 07/25/13 1629 07/25/13 2131 07/26/13 0739  GLUCAP 365* 311* 154* 190* 209*    Recent Results (from the past 240 hour(s))  CULTURE, BLOOD (ROUTINE X 2)     Status: None   Collection Time    07/24/13 10:10 PM      Result Value Range Status   Specimen Description BLOOD RIGHT FOREARM   Final   Special Requests BOTTLES DRAWN AEROBIC AND ANAEROBIC 3CC   Final   Culture  Setup Time     Final   Value: 07/25/2013 06:28     Performed at [facility]   Culture     Final   Value: GRAM POSITIVE COCCI IN  CLUSTERS     Note: Gram Stain Report Called to,Read Back By and Verified With: SHANNON YOUNG ON 07/25/2013 AT 9:53P BY [employer]     Performed at [facility]   Report Status PENDING   Incomplete  CULTURE, BLOOD (ROUTINE X 2)     Status: None   Collection Time    07/24/13 10:15 PM      Result Value Range Status   Specimen Description BLOOD RIGHT ANTECUBITAL   Final   Special Requests BOTTLES DRAWN AEROBIC AND ANAEROBIC 3CC   Final   Culture  Setup Time     Final   Value: 07/25/2013 06:27     Performed at [facility]   Culture     Final   Value: GRAM POSITIVE COCCI IN CLUSTERS     Note: Gram Stain Report Called to,Read Back By and Verified With: SHANNON YOUNG ON 07/25/2013 AT 9:53P BY WILEJ     Performed at [facility]   Report Status PENDING   Incomplete  MRSA PCR SCREENING     Status: None   Collection Time    07/25/13  2:49 AM      Result Value Range Status   MRSA by PCR NEGATIVE  NEGATIVE Final   Comment:            The GeneXpert MRSA Assay (FDA     approved for NASAL specimens     only), is one component of a     comprehensive MRSA colonization     surveillance program. It is not     intended to diagnose MRSA     infection nor to guide or     monitor treatment for     MRSA infections.     Studies: Ct Abdomen Pelvis Wo Contrast  07/25/2013   *RADIOLOGY REPORT*  Clinical Data: Back pain and shoulder pain.  CT ABDOMEN AND PELVIS WITHOUT CONTRAST  Technique:  Multidetector CT imaging of the abdomen and pelvis was performed following the standard protocol without intravenous contrast.  Comparison: Lumbar spine radiographs performed earlier today at 08:43 p.m.  Findings: Minimal bibasilar atelectasis is noted; the lung bases are not well assessed due to motion artifact.  The liver and spleen are unremarkable in appearance.  There is vicarious excretion of contrast within the gallbladder.  The gallbladder is unremarkable in appearance.  The pancreas and adrenal  glands are unremarkable.  The kidneys are unremarkable in appearance.  There is no evidence of hydronephrosis.  No renal or ureteral stones are seen.  No perinephric stranding is appreciated.  No free fluid is identified.  The small bowel is unremarkable in appearance.  The stomach is within normal limits.  No acute vascular abnormalities are seen.  The appendix is normal in caliber,  without evidence for appendicitis.  The colon is unremarkable in appearance.  The bladder is mildly distended and grossly unremarkable in appearance.  The prostate remains normal in size.  No inguinal lymphadenopathy is seen.  No acute osseous abnormalities are identified.  Mild vacuum phenomenon is noted at the left sacroiliac joint.  IMPRESSION: No acute abnormality seen in the abdomen or pelvis.   Original Report Authenticated By: Tonia Ghent, M.D.   Dg Chest 1 View  07/24/2013   CLINICAL DATA:  Short of breath, back pain  EXAM: CHEST - 1 VIEW Unable to obtain lateral view secondary to patient body habitus.  COMPARISON:  Prior chest x-ray 09/12/2012  FINDINGS: Cardiomegaly with left ventricular prominence. Pulmonary vascular congestion without overt edema. No large pleural effusion. No pneumothorax. No acute osseous abnormality.  IMPRESSION: Limited study secondary to patient body habitus and inability to obtain a lateral view.  Mild cardiomegaly with left ventricular prominence and pulmonary vascular congestion without overt edema.   Electronically Signed   By: Malachy Moan M.D.   On: 07/24/2013 23:13   Dg Lumbar Spine Complete  07/24/2013   CLINICAL DATA:  Low back pain  EXAM: LUMBAR SPINE - COMPLETE 4+ VIEW  COMPARISON:  11/12/2005  FINDINGS: Limited mobility for imaging due to pain. Prominent stool throughout the colon favors constipation. Body habitus reduces diagnostic sensitivity and specificity. There is reduced intervertebral disc height at L5-S1. No fracture or acute subluxation in the lumbar spine is observed.   IMPRESSION: 1. No fracture or acute subluxation. 2. Reduced intervertebral disc height at L5-S1 suggesting degenerative disk disease. 3.  Prominent stool throughout the colon favors constipation. 4. Reduced sensitivity due to limited positioning mobility and body habitus.   Electronically Signed   By: Herbie Baltimore M.D.   On: 07/24/2013 21:09   Dg Shoulder Left  07/24/2013   CLINICAL DATA:  Left shoulder pain  EXAM: LEFT SHOULDER - 2+ VIEW  COMPARISON:  CT scan from 07/18/2013  FINDINGS: Suboptimal positioning due to the mobility, body habitus, and patient's discomfort.  Linear lucency in the acromion suggests os acromiale or less likely acromial fracture. Acromion clavicular distance measured at 1.1 cm, mildly abnormally widened. Negative predictive value of today's exam for shoulder dislocation is low.  IMPRESSION: 1. Potential AC joint widening with suspected os acromiale or acromial fracture. Correlate with point tenderness over the [facility] joint. 2. Reduced sensitivity from difficulty positioning.   Electronically Signed   By: Herbie Baltimore M.D.   On: 07/24/2013 21:14   Dg Foot 2 Views Left  07/24/2013   CLINICAL DATA:  Ulceration at the base of the foot; concern for osteomyelitis.  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 A large ulceration is noted along the plantar surface of the foot, apparently extending to the osseous structures. Underlying osteomyelitis cannot be excluded.  Visualized joint spaces are grossly preserved.  IMPRESSION: Large ulceration along the plantar aspect of the left foot, apparently extending to the osseous structures. Underlying osteomyelitis cannot be excluded. MRI could be considered for further evaluation, when and as deemed clinically appropriate.   Electronically Signed   By: Roanna Raider M.D.   On: 07/24/2013 23:29    Scheduled  Meds: . dextrose  25 g Intravenous Once  . heparin  5,000 Units Subcutaneous Q8H  . insulin aspart  0-20 Units Subcutaneous TID WC  .  insulin aspart  0-5 Units Subcutaneous QHS  . insulin aspart  10 Units Intravenous Once  . insulin glargine  20 Units Subcutaneous BID  . living well with diabetes book   Does not apply Once  . nystatin cream   Topical BID  . piperacillin-tazobactam (ZOSYN)  IV  3.375 g Intravenous Q8H  . silver sulfADIAZINE   Topical Daily  . vancomycin  2,000 mg Intravenous Q24H   Continuous Infusions: . sodium chloride 100 mL/hr at 07/25/13 0145    Principal Problem:   Sepsis Active Problems:   DIABETES, TYPE 2   Morbid obesity, BMI unknown   Diabetic foot ulcer   ARF (acute renal failure)    Time spent:    [facility]  [facility] Pager [phone]. If 7PM-7AM, please contact night-coverage at [email or web address], password [facility] 07/26/2013, 10:34 AM  LOS: 2 days

## 2013-07-26 NOTE — Consult Note (Addendum)
[facility]    Date of Admission:  07/24/2013  Date of Consult:  07/26/2013  Reason for Consult: Group B streptococcal bacteremia, transmetarsal amputation site ulcer, left shoulder pain Referring Physician: Dr. Jomarie Longs   HPI: Adrian Khan is an 37 y.o. 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 Dr Lajoyce Corners has seen pt and performed bedside I and D feels no need for further surgery at the ulcer site on the left foot. He also believes that the pt has gout flare in the shoulder. Urinc acid is up to 12.3 but I am still not convinced that we have proven this yet.    Past Medical History  Diagnosis Date  . Shortness of breath     with exertion  . PONV (postoperative nausea and vomiting)   . Diabetes mellitus without complication   . Anxiety   . Mental disorder   . Depression   . Sleep apnea     done at [facility].  Marland Kitchen Pneumonia   . GERD (gastroesophageal reflux disease)   . Constipation   . Neuropathy     feet, hands  . Anginal pain     chest pressure has increased in the past few days; patient denied  on 09/20/12    Past Surgical History  Procedure Laterality Date  . I&d extremity  12/22/2011    Procedure: IRRIGATION AND DEBRIDEMENT EXTREMITY;  Surgeon: Kennieth Rad, MD;  Location: [facility] OR;  Service: Orthopedics;  Laterality: Left;  . I&d extremity  12/24/2011    Procedure: IRRIGATION AND DEBRIDEMENT EXTREMITY;  Surgeon: Kennieth Rad, MD;  Location: [facility] OR;  Service: Orthopedics;  Laterality: Left;  Irrigation and debridement left foot.  . I&d extremity  12/26/2011    Procedure: IRRIGATION AND DEBRIDEMENT EXTREMITY;  Surgeon: Kennieth Rad, MD;  Location: [facility] OR;  Service: Orthopedics;  Laterality: Left;  . I&d extremity  12/28/2011    Procedure: IRRIGATION AND DEBRIDEMENT EXTREMITY;  Surgeon: Kennieth Rad, MD;  Location: [facility]  OR;  Service: Orthopedics;  Laterality: Left;  I&D Left Foot;Application of  Wound Vac.to dorsal and plantar areas of left foot  . Eye surgery    . Toe surgery      Bone removed- 2 toe right  . Wisdom tooth extraction    . Amputation  09/22/2012    Procedure: AMPUTATION FOOT;  Surgeon: Nadara Mustard, MD;  Location: [facility] OR;  Service: Orthopedics;  Laterality: Left;  Left Midfoot Amputation  ergies:   Allergies  Allergen Reactions  . Sulfonamide Derivatives Other (See Comments)    cramps     Medications: I have reviewed patients current medications as documented in Epic Anti-infectives   Start     Dose/Rate Route Frequency Ordered Stop   07/26/13 1500  cefTRIAXone (ROCEPHIN) 2 g in dextrose 5 % 50 mL IVPB     2 g 100 mL/hr over 30 Minutes Intravenous Every 24 hours 07/26/13 1448     07/26/13 1300  ceFAZolin (ANCEF) IVPB 2 g/50 mL premix  Status:  Discontinued     2 g 100 mL/hr over 30 Minutes Intravenous Every 12 hours 07/26/13 1201 07/26/13 1448   07/25/13 1200  vancomycin (VANCOCIN) 2,000 mg in sodium chloride 0.9 % 500 mL IVPB  Status:  Discontinued     2,000 mg 250 mL/hr over 120 Minutes Intravenous Every  24 hours 07/25/13 0954 07/26/13 1448   07/25/13 1000  vancomycin (VANCOCIN) 1,500 mg in sodium chloride 0.9 % 500 mL IVPB  Status:  Discontinued     1,500 mg 250 mL/hr over 120 Minutes Intravenous Every 12 hours 07/25/13 0553 07/25/13 0952   07/25/13 1000  vancomycin (VANCOCIN) 2,000 mg in sodium chloride 0.9 % 500 mL IVPB  Status:  Discontinued     2,000 mg 250 mL/hr over 120 Minutes Intravenous Every 12 hours 07/25/13 0952 07/25/13 0954   07/25/13 0600  piperacillin-tazobactam (ZOSYN) IVPB 3.375 g  Status:  Discontinued     3.375 g 12.5 mL/hr over 240 Minutes Intravenous 3 times per day 07/25/13 0553 07/26/13 1101   07/24/13 2230  piperacillin-tazobactam (ZOSYN) IVPB 3.375 g     3.375 g 100 mL/hr over 30 Minutes Intravenous  Once 07/24/13 2226 07/25/13 0100   07/24/13 2230   vancomycin (VANCOCIN) 1,500 mg in sodium chloride 0.9 % 500 mL IVPB     1,500 mg 250 mL/hr over 120 Minutes Intravenous  Once 07/24/13 2226 07/25/13 0300      Social History:  reports that he has never smoked. He has never used smokeless tobacco. He reports that he does not drink alcohol or use illicit drugs.  History reviewed. No pertinent family history.  As in HPI and primary teams notes otherwise 12 point review of systems is negative  Blood pressure 136/84, pulse 91, temperature 98.2 F (36.8 C), temperature source Oral, resp. rate 20, height 5\' 11"  (1.803 m), weight 408 lb 8.2 oz (185.3 kg), SpO2 100.00%. General: Alert and awake, oriented x3, not in any acute distress. HEENT: anicteric sclera, pupils reactive to light and accommodation, EOMI, oropharynx clear and without exudate CVS regular rate, normal r,  no murmur rubs or gallops Chest: clear to auscultation bilaterally, no wheezing, rales or rhonchi Abdomen: soft nontender, nondistended, normal bowel sounds, Extremities: left shoulder is tender to palpation not esp warm, left foot with ulcer open moist, small amount of drainage,. moist Skin: no rashes Neuro: nonfocal, strength and sensation intact   Results for orders placed during the hospital encounter of 07/24/13 (from the past 48 hour(s))  CBC     Status: Abnormal   Collection Time    07/24/13  9:05 PM      Result Value Range   WBC 32.4 (*) 4.0 - 10.5 K/uL   Comment: REPEATED TO VERIFY   RBC 3.63 (*) 4.22 - 5.81 MIL/uL   Hemoglobin 9.8 (*) 13.0 - 17.0 g/dL   HCT 16.1 (*) 09.6 - 04.5 %   MCV 81.8  78.0 - 100.0 fL   MCH 27.0  26.0 - 34.0 pg   MCHC 33.0  30.0 - 36.0 g/dL   RDW 40.9  81.1 - 91.4 %   Platelets 481 (*) 150 - 400 K/uL  BASIC METABOLIC PANEL     Status: Abnormal   Collection Time    07/24/13  9:05 PM      Result Value Range   Sodium 125 (*) 135 - 145 mEq/L   Potassium 4.5  3.5 - 5.1 mEq/L   Chloride 92 (*) 96 - 112 mEq/L   CO2 17 (*) 19 - 32 mEq/L    Glucose, Bld 399 (*) 70 - 99 mg/dL   BUN 60 (*) 6 - 23 mg/dL   Creatinine, Ser 7.82 (*) 0.50 - 1.35 mg/dL   Calcium 8.8  8.4 - 95.6 mg/dL   GFR calc non Af Amer 36 (*) >90 mL/min  GFR calc Af Amer 41 (*) >90 mL/min   Comment: (NOTE)     The eGFR has been calculated using the CKD EPI equation.     This calculation has not been validated in all clinical situations.     eGFR's persistently <90 mL/min signify possible Chronic Kidney     Disease.  HEPATIC FUNCTION PANEL     Status: Abnormal   Collection Time    07/24/13  9:42 PM      Result Value Range   Total Protein 8.5 (*) 6.0 - 8.3 g/dL   Albumin 1.3 (*) 3.5 - 5.2 g/dL   AST 14  0 - 37 U/L   ALT 8  0 - 53 U/L   Alkaline Phosphatase 149 (*) 39 - 117 U/L   Total Bilirubin 0.4  0.3 - 1.2 mg/dL   Bilirubin, Direct 0.1  0.0 - 0.3 mg/dL   Comment: SLIGHT HEMOLYSIS   Indirect Bilirubin 0.3  0.3 - 0.9 mg/dL  CULTURE, BLOOD (ROUTINE X 2)     Status: None   Collection Time    07/24/13 10:10 PM      Result Value Range   Specimen Description BLOOD RIGHT FOREARM     Special Requests BOTTLES DRAWN AEROBIC AND ANAEROBIC 3CC     Culture  Setup Time       Value: 07/25/2013 06:28     Performed at [facility]   Culture       Value: GROUP B STREP(S.AGALACTIAE)ISOLATED     Note: Gram Stain Report Called to,Read Back By and Verified With: SHANNON YOUNG ON 07/25/2013 AT 9:53P BY WILEJ     Performed at [facility]   Report Status PENDING    CULTURE, BLOOD (ROUTINE X 2)     Status: None   Collection Time    07/24/13 10:15 PM      Result Value Range   Specimen Description BLOOD RIGHT ANTECUBITAL     Special Requests BOTTLES DRAWN AEROBIC AND ANAEROBIC 3CC     Culture  Setup Time       Value: 07/25/2013 06:27     Performed at [facility]   Culture       Value: GROUP B STREP(S.AGALACTIAE)ISOLATED     Note: Gram Stain Report Called to,Read Back By and Verified With: SHANNON YOUNG ON 07/25/2013 AT 9:53P BY [employer]      Performed at [facility]   Report Status PENDING    CG4 I-STAT (LACTIC ACID)     Status: None   Collection Time    07/24/13 10:29 PM      Result Value Range   Lactic Acid, Venous 1.41  0.5 - 2.2 mmol/L  URINALYSIS, ROUTINE W REFLEX MICROSCOPIC     Status: Abnormal   Collection Time    07/24/13 11:21 PM      Result Value Range   Color, Urine YELLOW  YELLOW   APPearance CLOUDY (*) CLEAR   Specific Gravity, Urine 1.031 (*) 1.005 - 1.030   pH 5.5  5.0 - 8.0   Glucose, UA >1000 (*) NEGATIVE mg/dL   Hgb urine dipstick LARGE (*) NEGATIVE   Bilirubin Urine NEGATIVE  NEGATIVE   Ketones, ur NEGATIVE  NEGATIVE mg/dL   Protein, ur >161 (*) NEGATIVE mg/dL   Urobilinogen, UA 1.0  0.0 - 1.0 mg/dL   Nitrite NEGATIVE  NEGATIVE   Leukocytes, UA NEGATIVE  NEGATIVE  URINE MICROSCOPIC-ADD ON     Status: None   Collection Time  07/24/13 11:21 PM      Result Value Range   Squamous Epithelial / LPF RARE  RARE   WBC, UA 3-6  <3 WBC/hpf   RBC / HPF 11-20  <3 RBC/hpf   Bacteria, UA RARE  RARE   Urine-Other AMORPHOUS URATES/PHOSPHATES     Comment: LESS THAN 10 mL OF URINE SUBMITTED  GLUCOSE, CAPILLARY     Status: Abnormal   Collection Time    07/25/13  2:07 AM      Result Value Range   Glucose-Capillary 308 (*) 70 - 99 mg/dL   Comment 1 Notify RN    MRSA PCR SCREENING     Status: None   Collection Time    07/25/13  2:49 AM      Result Value Range   MRSA by PCR NEGATIVE  NEGATIVE   Comment:            The GeneXpert MRSA Assay (FDA     approved for NASAL specimens     only), is one component of a     comprehensive MRSA colonization     surveillance program. It is not     intended to diagnose MRSA     infection nor to guide or     monitor treatment for     MRSA infections.  CBC     Status: Abnormal   Collection Time    07/25/13  5:44 AM      Result Value Range   WBC 40.6 (*) 4.0 - 10.5 K/uL   Comment: REPEATED TO VERIFY   RBC 3.10 (*) 4.22 - 5.81 MIL/uL   Hemoglobin 8.3 (*)  13.0 - 17.0 g/dL   HCT 16.1 (*) 09.6 - 04.5 %   MCV 82.3  78.0 - 100.0 fL   MCH 26.8  26.0 - 34.0 pg   MCHC 32.5  30.0 - 36.0 g/dL   RDW 40.9  81.1 - 91.4 %   Platelets 457 (*) 150 - 400 K/uL  COMPREHENSIVE METABOLIC PANEL     Status: Abnormal   Collection Time    07/25/13  5:44 AM      Result Value Range   Sodium 126 (*) 135 - 145 mEq/L   Potassium 4.7  3.5 - 5.1 mEq/L   Chloride 94 (*) 96 - 112 mEq/L   CO2 18 (*) 19 - 32 mEq/L   Glucose, Bld 384 (*) 70 - 99 mg/dL   BUN 62 (*) 6 - 23 mg/dL   Creatinine, Ser 7.82 (*) 0.50 - 1.35 mg/dL   Calcium 8.4  8.4 - 95.6 mg/dL   Total Protein 7.5  6.0 - 8.3 g/dL   Albumin 1.1 (*) 3.5 - 5.2 g/dL   AST 12  0 - 37 U/L   ALT 7  0 - 53 U/L   Alkaline Phosphatase 140 (*) 39 - 117 U/L   Total Bilirubin 0.5  0.3 - 1.2 mg/dL   GFR calc non Af Amer 33 (*) >90 mL/min   GFR calc Af Amer 38 (*) >90 mL/min   Comment: (NOTE)     The eGFR has been calculated using the CKD EPI equation.     This calculation has not been validated in all clinical situations.     eGFR's persistently <90 mL/min signify possible Chronic Kidney     Disease.  HEMOGLOBIN A1C     Status: Abnormal   Collection Time    07/25/13  5:44 AM      Result Value Range  Hemoglobin A1C 13.7 (*) <5.7 %   Comment: (NOTE)                                                                               According to the ADA Clinical Practice Recommendations for 2011, when     HbA1c is used as a screening test:      >=6.5%   Diagnostic of Diabetes Mellitus               (if abnormal result is confirmed)     5.7-6.4%   Increased risk of developing Diabetes Mellitus     References:Diagnosis and Classification of Diabetes Mellitus,Diabetes     Care,2011,34(Suppl 1):S62-S69 and Standards of Medical Care in             Diabetes - 2011,Diabetes Care,2011,34 (Suppl 1):S11-S61.   Mean Plasma Glucose 346 (*) <117 mg/dL   Comment: Performed at [facility]  URIC ACID     Status: Abnormal    Collection Time    07/25/13  5:44 AM      Result Value Range   Uric Acid, Serum 12.4 (*) 4.0 - 7.8 mg/dL  GLUCOSE, CAPILLARY     Status: Abnormal   Collection Time    07/25/13  7:45 AM      Result Value Range   Glucose-Capillary 365 (*) 70 - 99 mg/dL   Comment 1 Notify RN    GLUCOSE, CAPILLARY     Status: Abnormal   Collection Time    07/25/13 11:12 AM      Result Value Range   Glucose-Capillary 311 (*) 70 - 99 mg/dL   Comment 1 Notify RN    GLUCOSE, CAPILLARY     Status: Abnormal   Collection Time    07/25/13  4:29 PM      Result Value Range   Glucose-Capillary 154 (*) 70 - 99 mg/dL  GLUCOSE, CAPILLARY     Status: Abnormal   Collection Time    07/25/13  9:31 PM      Result Value Range   Glucose-Capillary 190 (*) 70 - 99 mg/dL  CBC     Status: Abnormal   Collection Time    07/26/13  5:25 AM      Result Value Range   WBC 40.4 (*) 4.0 - 10.5 K/uL   Comment: QUANTITY NOT SUFFICIENT TO REPEAT TEST     CONSISTENT WITH PREVIOUS RESULT   RBC 3.22 (*) 4.22 - 5.81 MIL/uL   Hemoglobin 8.7 (*) 13.0 - 17.0 g/dL   HCT 16.1 (*) 09.6 - 04.5 %   MCV 81.7  78.0 - 100.0 fL   MCH 27.0  26.0 - 34.0 pg   MCHC 33.1  30.0 - 36.0 g/dL   RDW 40.9  81.1 - 91.4 %   Platelets 463 (*) 150 - 400 K/uL  BASIC METABOLIC PANEL     Status: Abnormal   Collection Time    07/26/13  5:25 AM      Result Value Range   Sodium 127 (*) 135 - 145 mEq/L   Potassium 5.8 (*) 3.5 - 5.1 mEq/L   Comment: DELTA CHECK NOTED     REPEATED TO VERIFY  NO VISIBLE HEMOLYSIS   Chloride 97  96 - 112 mEq/L   CO2 14 (*) 19 - 32 mEq/L   Glucose, Bld 213 (*) 70 - 99 mg/dL   BUN 67 (*) 6 - 23 mg/dL   Creatinine, Ser 1.61 (*) 0.50 - 1.35 mg/dL   Calcium 8.2 (*) 8.4 - 10.5 mg/dL   GFR calc non Af Amer 26 (*) >90 mL/min   GFR calc Af Amer 30 (*) >90 mL/min   Comment: (NOTE)     The eGFR has been calculated using the CKD EPI equation.     This calculation has not been validated in all clinical situations.     eGFR's  persistently <90 mL/min signify possible Chronic Kidney     Disease.  GLUCOSE, CAPILLARY     Status: Abnormal   Collection Time    07/26/13  7:39 AM      Result Value Range   Glucose-Capillary 209 (*) 70 - 99 mg/dL   Comment 1 Notify RN    GLUCOSE, CAPILLARY     Status: Abnormal   Collection Time    07/26/13 11:43 AM      Result Value Range   Glucose-Capillary 207 (*) 70 - 99 mg/dL  GLUCOSE, CAPILLARY     Status: Abnormal   Collection Time    07/26/13  4:04 PM      Result Value Range   Glucose-Capillary 211 (*) 70 - 99 mg/dL      Component Value Date/Time   SDES BLOOD RIGHT ANTECUBITAL 07/24/2013 2215   SPECREQUEST BOTTLES DRAWN AEROBIC AND ANAEROBIC 3CC 07/24/2013 2215   CULT  Value: GROUP B STREP(S.AGALACTIAE)ISOLATED Note: Gram Stain Report Called to,Read Back By and Verified With: SHANNON YOUNG ON 07/25/2013 AT 9:53P BY WILEJ Performed at [facility] 07/24/2013 2215   REPTSTATUS PENDING 07/24/2013 2215   Ct Abdomen Pelvis Wo Contrast  07/25/2013   *RADIOLOGY REPORT*  Clinical Data: Back pain and shoulder pain.  CT ABDOMEN AND PELVIS WITHOUT CONTRAST  Technique:  Multidetector CT imaging of the abdomen and pelvis was performed following the standard protocol without intravenous contrast.  Comparison: Lumbar spine radiographs performed earlier today at 08:43 p.m.  Findings: Minimal bibasilar atelectasis is noted; the lung bases are not well assessed due to motion artifact.  The liver and spleen are unremarkable in appearance.  There is vicarious excretion of contrast within the gallbladder.  The gallbladder is unremarkable in appearance.  The pancreas and adrenal glands are unremarkable.  The kidneys are unremarkable in appearance.  There is no evidence of hydronephrosis.  No renal or ureteral stones are seen.  No perinephric stranding is appreciated.  No free fluid is identified.  The small bowel is unremarkable in appearance.  The stomach is within normal limits.  No acute vascular  abnormalities are seen.  The appendix is normal in caliber, without evidence for appendicitis.  The colon is unremarkable in appearance.  The bladder is mildly distended and grossly unremarkable in appearance.  The prostate remains normal in size.  No inguinal lymphadenopathy is seen.  No acute osseous abnormalities are identified.  Mild vacuum phenomenon is noted at the left sacroiliac joint.  IMPRESSION: No acute abnormality seen in the abdomen or pelvis.   Original Report Authenticated By: Tonia Ghent, M.D.   Dg Chest 1 View  07/24/2013   CLINICAL DATA:  Short of breath, back pain  EXAM: CHEST - 1 VIEW Unable to obtain lateral view secondary to patient body habitus.  COMPARISON:  Prior chest x-ray 09/12/2012  FINDINGS: Cardiomegaly with left ventricular prominence. Pulmonary vascular congestion without overt edema. No large pleural effusion. No pneumothorax. No acute osseous abnormality.  IMPRESSION: Limited study secondary to patient body habitus and inability to obtain a lateral view.  Mild cardiomegaly with left ventricular prominence and pulmonary vascular congestion without overt edema.   Electronically Signed   By: Malachy Moan M.D.   On: 07/24/2013 23:13   Dg Lumbar Spine Complete  07/24/2013   CLINICAL DATA:  Low back pain  EXAM: LUMBAR SPINE - COMPLETE 4+ VIEW  COMPARISON:  11/12/2005  FINDINGS: Limited mobility for imaging due to pain. Prominent stool throughout the colon favors constipation. Body habitus reduces diagnostic sensitivity and specificity. There is reduced intervertebral disc height at L5-S1. No fracture or acute subluxation in the lumbar spine is observed.  IMPRESSION: 1. No fracture or acute subluxation. 2. Reduced intervertebral disc height at L5-S1 suggesting degenerative disk disease. 3.  Prominent stool throughout the colon favors constipation. 4. Reduced sensitivity due to limited positioning mobility and body habitus.   Electronically Signed   By: Herbie Baltimore M.D.    On: 07/24/2013 21:09   Dg Shoulder Left  07/24/2013   CLINICAL DATA:  Left shoulder pain  EXAM: LEFT SHOULDER - 2+ VIEW  COMPARISON:  CT scan from 07/18/2013  FINDINGS: Suboptimal positioning due to the mobility, body habitus, and patient's discomfort.  Linear lucency in the acromion suggests os acromiale or less likely acromial fracture. Acromion clavicular distance measured at 1.1 cm, mildly abnormally widened. Negative predictive value of today's exam for shoulder dislocation is low.  IMPRESSION: 1. Potential AC joint widening with suspected os acromiale or acromial fracture. Correlate with point tenderness over the [facility] joint. 2. Reduced sensitivity from difficulty positioning.   Electronically Signed   By: Herbie Baltimore M.D.   On: 07/24/2013 21:14   Dg Foot 2 Views Left  07/24/2013   CLINICAL DATA:  Ulceration at the base of the foot; concern for osteomyelitis.  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 A large ulceration is noted along the plantar surface of the foot, apparently extending to the osseous structures. Underlying osteomyelitis cannot be excluded.  Visualized joint spaces are grossly preserved.  IMPRESSION: Large ulceration along the plantar aspect of the left foot, apparently extending to the osseous structures. Underlying osteomyelitis cannot be excluded. MRI could be considered for further evaluation, when and as deemed clinically appropriate.   Electronically Signed   By: Roanna Raider M.D.   On: 07/24/2013 23:29     Recent Results (from the past 720 hour(s))  CULTURE, BLOOD (ROUTINE X 2)     Status: None   Collection Time    07/24/13 10:10 PM      Result Value Range Status   Specimen Description BLOOD RIGHT FOREARM   Final   Special Requests BOTTLES DRAWN AEROBIC AND ANAEROBIC 3CC   Final   Culture  Setup Time     Final   Value: 07/25/2013  06:28     Performed at [facility]   Culture     Final   Value: GROUP B STREP(S.AGALACTIAE)ISOLATED     Note: Gram Stain Report Called to,Read Back By and Verified With: SHANNON YOUNG ON 07/25/2013 AT 9:53P BY [employer]     Performed at [facility]   Report Status  PENDING   Incomplete  CULTURE, BLOOD (ROUTINE X 2)     Status: None   Collection Time    07/24/13 10:15 PM      Result Value Range Status   Specimen Description BLOOD RIGHT ANTECUBITAL   Final   Special Requests BOTTLES DRAWN AEROBIC AND ANAEROBIC 3CC   Final   Culture  Setup Time     Final   Value: 07/25/2013 06:27     Performed at [facility]   Culture     Final   Value: GROUP B STREP(S.AGALACTIAE)ISOLATED     Note: Gram Stain Report Called to,Read Back By and Verified With: SHANNON YOUNG ON 07/25/2013 AT 9:53P BY WILEJ     Performed at [facility]   Report Status PENDING   Incomplete  MRSA PCR SCREENING     Status: None   Collection Time    07/25/13  2:49 AM      Result Value Range Status   MRSA by PCR NEGATIVE  NEGATIVE Final   Comment:            The GeneXpert MRSA Assay (FDA     approved for NASAL specimens     only), is one component of a     comprehensive MRSA colonization     surveillance program. It is not     intended to diagnose MRSA     infection nor to guide or     monitor treatment for     MRSA infections.     Impression/Recommendation  37 year old with Diabetes, morbid obesity prior mid foot amputation with admission with GBS bacteremia and left shoulder pain (new) along with new ulcer at amputation site  #1 GBS bacteremia: left foot ulcer is HIGHLY likely portal of entry. Yes he has elevated uric acid but he also has Bacteremia so I have high anxiety about his left shoulder being infected  --narrow to Rocephin --fu blood cultures --would ask  IR to aspirate the left shoulder joint and send for: (highes priority tests first)  #1 Cell count and differential  #2  crystal analysis #3 Gram stain and culture  --continue to make sure foot is closely followed --could consider open MRI of shoulder and foot but would need to be outpatient at [facility]  --would consider TEE, I doubt 2d echo will be very helpful, though TEE will also not be trivial in terms of risk given pts body habitus and OSA  --Regardless would treat for 6 weeks minimum  #2 Shoulder pain: see above  #3 ulcer in amputation site: see above  #4 Screening check HIV, hep c   Thank you so much for this interesting consult  [facility] [phone] (pager) [phone] (office) 07/26/2013, 4:34 PM  Paulette Blanch Dam 07/26/2013, 4:34 PM

## 2013-07-26 NOTE — Progress Notes (Signed)
CRITICAL VALUE ALERT  Critical value received:  2 aerobic and 2 anaerobic BCs with gram + cocci in clusters  Date of notification:  10814  Time of notification:  2153  Critical value read back:yes  Nurse who received alert:  S.Young,RN  MD notified (1st page):  Janyth Contes  Time of first page:  2250  MD notified (2nd page):  Time of second page:  Responding MD:    Time MD responded:

## 2013-07-27 DIAGNOSIS — R7881 Bacteremia: Secondary | ICD-10-CM

## 2013-07-27 LAB — CBC
Hemoglobin: 7.8 g/dL — ABNORMAL LOW (ref 13.0–17.0)
Platelets: 456 10*3/uL — ABNORMAL HIGH (ref 150–400)
RBC: 2.96 MIL/uL — ABNORMAL LOW (ref 4.22–5.81)
WBC: 29.7 10*3/uL — ABNORMAL HIGH (ref 4.0–10.5)

## 2013-07-27 LAB — CREATININE, SERUM
GFR calc Af Amer: 34 mL/min — ABNORMAL LOW (ref >90)
GFR calc non Af Amer: 29 mL/min — ABNORMAL LOW (ref >90)

## 2013-07-27 LAB — CULTURE, BLOOD (ROUTINE X 2)

## 2013-07-27 LAB — BASIC METABOLIC PANEL
CO2: 18 mEq/L — ABNORMAL LOW (ref 19–32)
Chloride: 100 mEq/L (ref 96–112)
Creatinine, Ser: 2.87 mg/dL — ABNORMAL HIGH (ref 0.50–1.35)
GFR calc Af Amer: 31 mL/min — ABNORMAL LOW (ref >90)
Glucose, Bld: 176 mg/dL — ABNORMAL HIGH (ref 70–99)
Potassium: 4.5 mEq/L (ref 3.5–5.1)
Sodium: 132 mEq/L — ABNORMAL LOW (ref 135–145)

## 2013-07-27 LAB — GLUCOSE, CAPILLARY
Glucose-Capillary: 156 mg/dL — ABNORMAL HIGH (ref 70–99)
Glucose-Capillary: 159 mg/dL — ABNORMAL HIGH (ref 70–99)
Glucose-Capillary: 155 mg/dL — ABNORMAL HIGH (ref 70–99)

## 2013-07-27 LAB — SEDIMENTATION RATE: Sed Rate: >140 mm/hr — ABNORMAL HIGH (ref 0–16)

## 2013-07-27 NOTE — Progress Notes (Signed)
Inpatient Diabetes Program Recommendations  AACE/ADA: New Consensus Statement on Inpatient Glycemic Control (2013)  Target Ranges:  Prepandial:   less than 140 mg/dL      Peak postprandial:   less than 180 mg/dL (1-2 hours)      Critically ill patients:  140 - 180 mg/dL   Reason for Visit: Uncontrolled DM  Results for LENO, MATHES (MRN [id number]) as of 07/27/2013 09:44  Ref. Range 07/25/2013 11:12 07/25/2013 16:29 07/25/2013 21:31 07/26/2013 07:39 07/26/2013 11:43 07/26/2013 16:04 07/26/2013 20:48 07/27/2013 07:11  Glucose-Capillary Latest Range: 70-99 mg/dL 409 (H) 811 (H) 914 (H) 209 (H) 207 (H) 211 (H) 181 (H) 156 (H)   Results for KASHIUS, DOMINIC (MRN [id number]) as of 07/27/2013 09:44  Ref. Range 07/27/2013 08:15  Sodium Latest Range: 135-145 mEq/L 132 (L)  Potassium Latest Range: 3.5-5.1 mEq/L 4.5  Chloride Latest Range: 96-112 mEq/L 100  CO2 Latest Range: 19-32 mEq/L 18 (L)  BUN Latest Range: 6-23 mg/dL 66 (H)  Creatinine Latest Range: 0.50-1.35 mg/dL 0.86 (H)  Calcium Latest Range: 8.4-10.5 mg/dL 7.9 (L)  GFR calc non Af Amer Latest Range: >90 mL/min 27 (L)  GFR calc Af Amer Latest Range: >90 mL/min 31 (L)  Glucose Latest Range: 70-99 mg/dL 578 (H)   Eating approx 50% meals.  Long discussion with Mom and pt regarding going home on insulin.  Discussed insulin administration, hypoglycemia S/S, glucose monitoring and importance of f/u with PCP to adjust insulin according to blood sugars. Pt has given himself insulin previously, but states "I don't like needles." (Mom has DM, on Lantus and Novolog) Requests prescription for home glucose meter prior to discharge. Encouraged pt and Mom to view diabetes videos on pt ed channel.  Recommendations: Consider addition of meal coverage insulin. Eating 50% here in hospital, but will likely need meal coverage at home. Would start with Novolog 8 units tidwc.  Will continue to follow. Thank you. Ailene Ards, RD, LDN, CDE Inpatient  Diabetes Coordinator [phone]

## 2013-07-27 NOTE — Progress Notes (Addendum)
[facility] PROGRESS NOTE  Adrian Khan ZOX:[id number] DOB: 04/16/1976 DOA: 07/24/2013 PCP: Jeri Cos, MD  Assessment/Plan: 1. Group B strep Sepsis/Bacteremia/L foot ulcer -concern for bone involvement per Xray, unable to check MRI due to weight limitations -Abx transitioned to Ceftriaxone yesterday -Ortho Consult appreciated, no need for debridement at this time per Dr.Duda -has necrotic area on R foot metatarsal head too -ID consult per Dr.VanDam appreciated -I have asked IR to consult for L shoulder aspiration, unfortunately Dr.Yamagata reviewed Recent Ct and Films and felt too difficult to reach L shoulder joint ?18cm deep from surface and yield would be very low due to low likelihood of reaching joint.  2. Morbid obesity -biggest challenge to his Health, d/w Mother and pt  3. Severe Uncontrolled DM -improved -Hbaic 13.7 -started on  lantus BID will increase dose, SSI -Dm coordinator consult  4. ARF on CKD -last baseline 1.3, likely from sepsis and NSAID use -hydrate, monitor, stop Ibuprofen -I/Os not recorded accurately, strict I/Os, pt declines foley -Ct without evidence of hydronephrosis -slight improvement in creatinine and Urine output picking up, expect this to improve further  5. L shoulder pain and tenderness -had Xray 10/7 with Potential AC joint widening with suspected os acromiale or acromial fracture /Gout also possibility -see d/w per IR regarding inability to access L shoulder joint -had a CT shoulder 10/1 without definite abnormality, but limited study due to body habitus, unable to check MRI scan  6. Hyperkalemia -improved  DVT proph: Hep SQ  Code Status: Full Family Communication: d/w mother at bedside Disposition Plan: to be determined   Consultants:  ORtho Dr.Duda  Antibiotics:  VAnc/Zosyn  HPI/Subjective: C/o pain in his back, L shoulder and L foot  Objective: Filed Vitals:   07/27/13 0605  BP: 136/84  Pulse: 91   Temp:   Resp: 22    Intake/Output Summary (Last 24 hours) at 07/27/13 0913 Last data filed at 07/27/13 0125  Gross per 24 hour  Intake 1792.72 ml  Output    400 ml  Net 1392.72 ml   Filed Weights   07/25/13 0551 07/25/13 1100  Weight: 187 kg (412 lb 4.2 oz) 185.3 kg (408 lb 8.2 oz)    Exam:   General: AAOx3, morbidly obese  Cardiovascular: S1S2/RRR  Respiratory: CTAB  Abdomen: soft, obese, with multiple folds  Musculoskeletal: 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, R foot base of 5th MTJ with necrotic toe   L shoulder with point tenderness medially and laterally  Sacrum with skin breakdown, multiple small pressure ulcers  Data Reviewed: Basic Metabolic Panel:  Recent Labs Lab 07/24/13 2105 07/25/13 0544 07/26/13 0525 07/27/13 0815  NA 125* 126* 127* 132*  K 4.5 4.7 5.8* 4.5  CL 92* 94* 97 100  CO2 17* 18* 14* 18*  GLUCOSE 399* 384* 213* 176*  BUN 60* 62* 67* 66*  CREATININE 2.28* 2.45* 2.95* 2.87*  CALCIUM 8.8 8.4 8.2* 7.9*   Liver Function Tests:  Recent Labs Lab 07/24/13 2142 07/25/13 0544  AST 14 12  ALT 8 7  ALKPHOS 149* 140*  BILITOT 0.4 0.5  PROT 8.5* 7.5  ALBUMIN 1.3* 1.1*   No results found for this basename: LIPASE, AMYLASE,  in the last 168 hours No results found for this basename: AMMONIA,  in the last 168 hours CBC:  Recent Labs Lab 07/24/13 2105 07/25/13 0544 07/26/13 0525 07/27/13 0815  WBC 32.4* 40.6* 40.4* 29.7*  HGB 9.8* 8.3* 8.7* 7.8*  HCT 29.7* 25.5*  26.3* 24.4*  MCV 81.8 82.3 81.7 82.4  PLT 481* 457* 463* 456*   Cardiac Enzymes: No results found for this basename: CKTOTAL, CKMB, CKMBINDEX, TROPONINI,  in the last 168 hours BNP (last 3 results) No results found for this basename: PROBNP,  in the last 8760 hours CBG:  Recent Labs Lab 07/26/13 0739 07/26/13 1143 07/26/13 1604 07/26/13 2048 07/27/13 0711  GLUCAP 209* 207* 211* 181* 156*    Recent Results (from the past 240 hour(s))   CULTURE, BLOOD (ROUTINE X 2)     Status: None   Collection Time    07/24/13 10:10 PM      Result Value Range Status   Specimen Description BLOOD RIGHT FOREARM   Final   Special Requests BOTTLES DRAWN AEROBIC AND ANAEROBIC 3CC   Final   Culture  Setup Time     Final   Value: 07/25/2013 06:28     Performed at [facility]   Culture     Final   Value: GROUP B STREP(S.AGALACTIAE)ISOLATED     Note: Gram Stain Report Called to,Read Back By and Verified With: SHANNON YOUNG ON 07/25/2013 AT 9:53P BY WILEJ     Performed at [facility]   Report Status 07/27/2013 FINAL   Final   Organism ID, Bacteria GROUP B STREP(S.AGALACTIAE)ISOLATED   Final  CULTURE, BLOOD (ROUTINE X 2)     Status: None   Collection Time    07/24/13 10:15 PM      Result Value Range Status   Specimen Description BLOOD RIGHT ANTECUBITAL   Final   Special Requests BOTTLES DRAWN AEROBIC AND ANAEROBIC 3CC   Final   Culture  Setup Time     Final   Value: 07/25/2013 06:27     Performed at [facility]   Culture     Final   Value: GROUP B STREP(S.AGALACTIAE)ISOLATED     Note: SUSCEPTIBILITIES PERFORMED ON PREVIOUS CULTURE WITHIN THE LAST 5 DAYS.     Note: Gram Stain Report Called to,Read Back By and Verified With: SHANNON YOUNG ON 07/25/2013 AT 9:53P BY WILEJ     Performed at [facility]   Report Status 07/27/2013 FINAL   Final  MRSA PCR SCREENING     Status: None   Collection Time    07/25/13  2:49 AM      Result Value Range Status   MRSA by PCR NEGATIVE  NEGATIVE Final   Comment:            The GeneXpert MRSA Assay (FDA     approved for NASAL specimens     only), is one component of a     comprehensive MRSA colonization     surveillance program. It is not     intended to diagnose MRSA     infection nor to guide or     monitor treatment for     MRSA infections.  WOUND CULTURE     Status: None   Collection Time    07/26/13 12:50 PM      Result Value Range Status   Specimen  Description FOOT   Final   Special Requests Normal   Final   Gram Stain     Final   Value: RARE WBC PRESENT, PREDOMINANTLY MONONUCLEAR     RARE SQUAMOUS EPITHELIAL CELLS PRESENT     NO ORGANISMS SEEN     Performed at [facility]   Culture     Final   Value: Culture reincubated  for better growth     Performed at [facility]   Report Status PENDING   Incomplete     Studies: No results found.  Scheduled Meds: . cefTRIAXone (ROCEPHIN)  IV  2 g Intravenous Q24H  . insulin aspart  0-20 Units Subcutaneous TID WC  . insulin aspart  0-5 Units Subcutaneous QHS  . insulin glargine  25 Units Subcutaneous BID  . living well with diabetes book   Does not apply Once  . nystatin cream   Topical BID  . silver sulfADIAZINE   Topical Daily   Continuous Infusions: . sodium chloride 100 mL/hr at 07/27/13 0125    Principal Problem:   Sepsis Active Problems:   DIABETES, TYPE 2   Morbid obesity, BMI unknown   Diabetic foot ulcer   ARF (acute renal failure)    Time spent:    [facility]  [facility] Pager [phone]. If 7PM-7AM, please contact night-coverage at [email or web address], password [facility] 07/27/2013, 9:13 AM  LOS: 3 days

## 2013-07-27 NOTE — Progress Notes (Addendum)
[facility]  Day # 2 rocephin   Subjective: Shoulder pain better   Antibiotics:  Anti-infectives   Start     Dose/Rate Route Frequency Ordered Stop   07/26/13 1500  cefTRIAXone (ROCEPHIN) 2 g in dextrose 5 % 50 mL IVPB     2 g 100 mL/hr over 30 Minutes Intravenous Every 24 hours 07/26/13 1448     07/26/13 1300  ceFAZolin (ANCEF) IVPB 2 g/50 mL premix  Status:  Discontinued     2 g 100 mL/hr over 30 Minutes Intravenous Every 12 hours 07/26/13 1201 07/26/13 1448   07/25/13 1200  vancomycin (VANCOCIN) 2,000 mg in sodium chloride 0.9 % 500 mL IVPB  Status:  Discontinued     2,000 mg 250 mL/hr over 120 Minutes Intravenous Every 24 hours 07/25/13 0954 07/26/13 1448   07/25/13 1000  vancomycin (VANCOCIN) 1,500 mg in sodium chloride 0.9 % 500 mL IVPB  Status:  Discontinued     1,500 mg 250 mL/hr over 120 Minutes Intravenous Every 12 hours 07/25/13 0553 07/25/13 0952   07/25/13 1000  vancomycin (VANCOCIN) 2,000 mg in sodium chloride 0.9 % 500 mL IVPB  Status:  Discontinued     2,000 mg 250 mL/hr over 120 Minutes Intravenous Every 12 hours 07/25/13 0952 07/25/13 0954   07/25/13 0600  piperacillin-tazobactam (ZOSYN) IVPB 3.375 g  Status:  Discontinued     3.375 g 12.5 mL/hr over 240 Minutes Intravenous 3 times per day 07/25/13 0553 07/26/13 1101   07/24/13 2230  piperacillin-tazobactam (ZOSYN) IVPB 3.375 g     3.375 g 100 mL/hr over 30 Minutes Intravenous  Once 07/24/13 2226 07/25/13 0100   07/24/13 2230  vancomycin (VANCOCIN) 1,500 mg in sodium chloride 0.9 % 500 mL IVPB     1,500 mg 250 mL/hr over 120 Minutes Intravenous  Once 07/24/13 2226 07/25/13 0300      Medications: Scheduled Meds: . cefTRIAXone (ROCEPHIN)  IV  2 g Intravenous Q24H  . insulin aspart  0-20 Units Subcutaneous TID WC  . insulin aspart  0-5 Units Subcutaneous QHS  . insulin glargine  25 Units Subcutaneous BID  . living well with diabetes book   Does not apply Once  . nystatin cream    Topical BID  . silver sulfADIAZINE   Topical Daily   Continuous Infusions: . sodium chloride 100 mL/hr at 07/27/13 1144   PRN Meds:.acetaminophen, acetaminophen, morphine injection, ondansetron (ZOFRAN) IV, ondansetron, oxyCODONE-acetaminophen   Objective: Weight change:   Intake/Output Summary (Last 24 hours) at 07/27/13 1528 Last data filed at 07/27/13 1200  Gross per 24 hour  Intake 1672.72 ml  Output   1100 ml  Net 572.72 ml   Blood pressure 129/72, pulse 89, temperature 98.2 F (36.8 C), temperature source Oral, resp. rate 18, height 5\' 11"  (1.803 m), weight 408 lb 8.2 oz (185.3 kg), SpO2 100.00%. Temp:  [98.2 F (36.8 C)-98.4 F (36.9 C)] 98.2 F (36.8 C) (10/10 1303) Pulse Rate:  [86-91] 89 (10/10 1303) Resp:  [18-22] 18 (10/10 1303) BP: (125-136)/(72-84) 129/72 mmHg (10/10 1303) SpO2:  [99 %-100 %] 100 % (10/10 1303)  Physical Exam: General: Alert and awake, oriented x3, not in any acute distress.  HEENT: anicteric sclera, pupils reactive to light and accommodation, EOMI, oropharynx clear and without exudate  CVS regular rate, normal r, no murmur rubs or gallops  Chest: clear to auscultation bilaterally, no wheezing, rales or rhonchi  Abdomen: soft nontender, nondistended, normal bowel sounds,  Extremities: left  shoulder is tender to palpationnot esp warm, left foot with ulcer open moist, small amount of drainage,. moist  Skin: no rashes  Neuro: nonfocal, strength and sensation intact   Lab Results:  Recent Labs  07/26/13 0525 07/27/13 0815  WBC 40.4* 29.7*  HGB 8.7* 7.8*  HCT 26.3* 24.4*  PLT 463* 456*    BMET  Recent Labs  07/26/13 0525 07/27/13 0815 07/27/13 1150  NA 127* 132*  --   K 5.8* 4.5  --   CL 97 100  --   CO2 14* 18*  --   GLUCOSE 213* 176*  --   BUN 67* 66*  --   CREATININE 2.95* 2.87* 2.71*  CALCIUM 8.2* 7.9*  --     Micro Results: Recent Results (from the past 240 hour(s))  CULTURE, BLOOD (ROUTINE X 2)     Status: None     Collection Time    07/24/13 10:10 PM      Result Value Range Status   Specimen Description BLOOD RIGHT FOREARM   Final   Special Requests BOTTLES DRAWN AEROBIC AND ANAEROBIC 3CC   Final   Culture  Setup Time     Final   Value: 07/25/2013 06:28     Performed at [facility]   Culture     Final   Value: GROUP B STREP(S.AGALACTIAE)ISOLATED     Note: Gram Stain Report Called to,Read Back By and Verified With: SHANNON YOUNG ON 07/25/2013 AT 9:53P BY WILEJ     Performed at [facility]   Report Status 07/27/2013 FINAL   Final   Organism ID, Bacteria GROUP B STREP(S.AGALACTIAE)ISOLATED   Final  CULTURE, BLOOD (ROUTINE X 2)     Status: None   Collection Time    07/24/13 10:15 PM      Result Value Range Status   Specimen Description BLOOD RIGHT ANTECUBITAL   Final   Special Requests BOTTLES DRAWN AEROBIC AND ANAEROBIC 3CC   Final   Culture  Setup Time     Final   Value: 07/25/2013 06:27     Performed at [facility]   Culture     Final   Value: GROUP B STREP(S.AGALACTIAE)ISOLATED     Note: SUSCEPTIBILITIES PERFORMED ON PREVIOUS CULTURE WITHIN THE LAST 5 DAYS.     Note: Gram Stain Report Called to,Read Back By and Verified With: SHANNON YOUNG ON 07/25/2013 AT 9:53P BY WILEJ     Performed at [facility]   Report Status 07/27/2013 FINAL   Final  MRSA PCR SCREENING     Status: None   Collection Time    07/25/13  2:49 AM      Result Value Range Status   MRSA by PCR NEGATIVE  NEGATIVE Final   Comment:            The GeneXpert MRSA Assay (FDA     approved for NASAL specimens     only), is one component of a     comprehensive MRSA colonization     surveillance program. It is not     intended to diagnose MRSA     infection nor to guide or     monitor treatment for     MRSA infections.  WOUND CULTURE     Status: None   Collection Time    07/26/13 12:50 PM      Result Value Range Status   Specimen Description FOOT   Final   Special Requests Normal    Final  Gram Stain     Final   Value: RARE WBC PRESENT, PREDOMINANTLY MONONUCLEAR     RARE SQUAMOUS EPITHELIAL CELLS PRESENT     NO ORGANISMS SEEN     Performed at [facility]   Culture     Final   Value: Culture reincubated for better growth     Performed at [facility]   Report Status PENDING   Incomplete    Studies/Results: No results found.    Assessment/Plan: Adrian Khan is a 37 y.o. 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 Yes he has elevated uric acid but he also has Bacteremia so I have high anxiety about his left shoulder being infected IR was unable to perform aspiration of the joint.  Given that Orthopedics is unconvinced of infection in the shoulder and IR cannot access the joint, I would consider MRI in "open MRI" with [facility]  If this cannot be done as an inpatient could coordinate as an outpatient and readmit for consideration of surgery if imaging suggested septic joint or showed abscess    For now will continue Rocephin and I would plan on a total of 6 weeks of IV therapy with today being day #1    --Regardless would treat for 6 weeks minimum  --would placed PICC vs central line (pt with CKD) tomorrow am  #2 Septic shoulder vs gout flare: need joint fluid to distinguish two curious she feels better without gout therapy OR definitive surgical intervention at this point. As above would consider open MRI, follow and if she worsens would also then reconsult Orthopedic surgery  #3 AMputation site infection: Dr Lajoyce Corners following.  Dr. Luciana Axe is available for questions over the weekend and I will be back on Monday.   I will ask my clinic manager to arrange for Hospital followup regardless   LOS: 3 days   Acey Lav 07/27/2013, 3:28  PM

## 2013-07-28 DIAGNOSIS — L02619 Cutaneous abscess of unspecified foot: Secondary | ICD-10-CM

## 2013-07-28 DIAGNOSIS — B951 Streptococcus, group B, as the cause of diseases classified elsewhere: Secondary | ICD-10-CM

## 2013-07-28 LAB — CBC
Hemoglobin: 8.5 g/dL — ABNORMAL LOW (ref 13.0–17.0)
MCH: 26.8 pg (ref 26.0–34.0)
MCHC: 31.7 g/dL (ref 30.0–36.0)
MCV: 84.5 fL (ref 78.0–100.0)
RBC: 3.17 MIL/uL — ABNORMAL LOW (ref 4.22–5.81)

## 2013-07-28 LAB — GLUCOSE, CAPILLARY
Glucose-Capillary: 107 mg/dL — ABNORMAL HIGH (ref 70–99)
Glucose-Capillary: 243 mg/dL — ABNORMAL HIGH (ref 70–99)
Glucose-Capillary: 161 mg/dL — ABNORMAL HIGH (ref 70–99)
Glucose-Capillary: 105 mg/dL — ABNORMAL HIGH (ref 70–99)

## 2013-07-28 LAB — WOUND CULTURE: Special Requests: NORMAL

## 2013-07-28 LAB — BASIC METABOLIC PANEL
CO2: 16 mEq/L — ABNORMAL LOW (ref 19–32)
Calcium: 7.9 mg/dL — ABNORMAL LOW (ref 8.4–10.5)
Creatinine, Ser: 2.43 mg/dL — ABNORMAL HIGH (ref 0.50–1.35)
GFR calc Af Amer: 38 mL/min — ABNORMAL LOW (ref >90)
GFR calc non Af Amer: 33 mL/min — ABNORMAL LOW (ref >90)
Glucose, Bld: 118 mg/dL — ABNORMAL HIGH (ref 70–99)
Sodium: 132 mEq/L — ABNORMAL LOW (ref 135–145)

## 2013-07-28 MED ORDER — POLYETHYLENE GLYCOL 3350 17 G PO PACK
17.0000 g | PACK | Freq: Every day | ORAL | Status: DC | PRN
  Administered 2013-07-28 – 2013-07-31 (×2): 17 g via ORAL
  Filled 2013-07-28 (×3): qty 1

## 2013-07-28 MED ORDER — HYDROMORPHONE HCL PF 1 MG/ML IJ SOLN
1.0000 mg | Freq: Once | INTRAMUSCULAR | Status: AC
  Administered 2013-07-28: 1 mg via INTRAVENOUS
  Filled 2013-07-28: qty 1

## 2013-07-28 MED ORDER — HYDROMORPHONE HCL PF 1 MG/ML IJ SOLN
1.0000 mg | INTRAMUSCULAR | Status: DC | PRN
  Administered 2013-07-29 – 2013-07-31 (×3): 1 mg via INTRAVENOUS
  Filled 2013-07-28 (×5): qty 1

## 2013-07-28 NOTE — Progress Notes (Signed)
[facility] PROGRESS NOTE  JAGJIT RINER HQI:[id number] DOB: [birth year] DOA: 07/24/2013 PCP: Jeri Cos, MD  Assessment/Plan: 1. Group B strep Sepsis/Bacteremia/L foot ulcer -concern for bone involvement per Xray, unable to check MRI due to weight limitations -Abx transitioned to Ceftriaxone yesterday -Ortho Consult appreciated, no need for debridement at this time per Dr.Duda -has necrotic area on R foot metatarsal head too -ID consult per Dr.VanDam appreciated -I d/w and consulted IR  for L shoulder aspiration, unfortunately Dr.Yamagata reviewed Recent Ct and Films and felt too difficult to reach L shoulder joint ?18cm deep from surface and yield would be very low due to low likelihood of reaching joint. -Called [facility] for Open MRI, no openings over weekend, will try again Monday  2. Morbid obesity -biggest challenge to his Health, d/w Mother and pt  3. Severe Uncontrolled DM -improved -Hbaic 13.7 -continue current dose of lantus BID, SSI -Dm coordinator consult noted  4. ARF on CKD -last baseline 1.3, likely from sepsis and NSAID use -hydrate, monitor, stop Ibuprofen -I/Os not recorded accurately, strict I/Os, pt declines foley -Ct without evidence of hydronephrosis -some improvement in creatinine and Urine output picking up, expect this to improve further -cut down IVF  5. L shoulder pain and tenderness -had Xray 10/7 with Potential AC joint widening with suspected os acromiale or acromial fracture /Gout also possibility -see d/w per IR regarding inability to access L shoulder joint -had a CT shoulder 10/1 without definite abnormality, but limited study due to body habitus, unable to check MRI scan here -I will call [facility] on Monday and attempt to schedule Open MRI of L shoulder  6. Hyperkalemia -improved  DVT proph: Hep SQ  Code Status: Full Family Communication: d/w mother at bedside Disposition Plan: to be  determined   Consultants:  ORtho Dr.Duda  Antibiotics:  VAnc/Zosyn  HPI/Subjective: C/o pain in his back, L shoulder pain improving, able to move his arm better  Objective: Filed Vitals:   07/28/13 0754  BP: 164/76  Pulse: 96  Temp:   Resp:     Intake/Output Summary (Last 24 hours) at 07/28/13 1040 Last data filed at 07/28/13 0720  Gross per 24 hour  Intake 1659.24 ml  Output    500 ml  Net 1159.24 ml   Filed Weights   07/25/13 0551 07/25/13 1100  Weight: 187 kg (412 lb 4.2 oz) 185.3 kg (408 lb 8.2 oz)    Exam:   General: AAOx3, morbidly obese  Cardiovascular: S1S2/RRR  Respiratory: CTAB  Abdomen: soft, obese, with multiple folds  Musculoskeletal: 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, R foot base of 5th MTJ with necrotic toe   L shoulder with point tenderness medially and laterally, improved  Sacrum with skin breakdown, multiple small pressure ulcers  Data Reviewed: Basic Metabolic Panel:  Recent Labs Lab 07/24/13 2105 07/25/13 0544 07/26/13 0525 07/27/13 0815 07/27/13 1150 07/28/13 0630  NA 125* 126* 127* 132*  --  132*  K 4.5 4.7 5.8* 4.5  --  4.7  CL 92* 94* 97 100  --  103  CO2 17* 18* 14* 18*  --  16*  GLUCOSE 399* 384* 213* 176*  --  118*  BUN 60* 62* 67* 66*  --  61*  CREATININE 2.28* 2.45* 2.95* 2.87* 2.71* 2.43*  CALCIUM 8.8 8.4 8.2* 7.9*  --  7.9*   Liver Function Tests:  Recent Labs Lab 07/24/13 2142 07/25/13 0544  AST 14 12  ALT 8 7  ALKPHOS 149* 140*  BILITOT 0.4 0.5  PROT 8.5* 7.5  ALBUMIN 1.3* 1.1*   No results found for this basename: LIPASE, AMYLASE,  in the last 168 hours No results found for this basename: AMMONIA,  in the last 168 hours CBC:  Recent Labs Lab 07/24/13 2105 07/25/13 0544 07/26/13 0525 07/27/13 0815 07/28/13 0630  WBC 32.4* 40.6* 40.4* 29.7* 27.1*  HGB 9.8* 8.3* 8.7* 7.8* 8.5*  HCT 29.7* 25.5* 26.3* 24.4* 26.8*  MCV 81.8 82.3 81.7 82.4 84.5  PLT 481* 457* 463* 456* 422*    Cardiac Enzymes: No results found for this basename: CKTOTAL, CKMB, CKMBINDEX, TROPONINI,  in the last 168 hours BNP (last 3 results) No results found for this basename: PROBNP,  in the last 8760 hours CBG:  Recent Labs Lab 07/27/13 0711 07/27/13 1124 07/27/13 1637 07/27/13 2059 07/28/13 0730  GLUCAP 156* 159* 155* 152* 107*    Recent Results (from the past 240 hour(s))  CULTURE, BLOOD (ROUTINE X 2)     Status: None   Collection Time    07/24/13 10:10 PM      Result Value Range Status   Specimen Description BLOOD RIGHT FOREARM   Final   Special Requests BOTTLES DRAWN AEROBIC AND ANAEROBIC 3CC   Final   Culture  Setup Time     Final   Value: 07/25/2013 06:28     Performed at [facility]   Culture     Final   Value: GROUP B STREP(S.AGALACTIAE)ISOLATED     Note: Gram Stain Report Called to,Read Back By and Verified With: SHANNON YOUNG ON 07/25/2013 AT 9:53P BY WILEJ     Performed at [facility]   Report Status 07/27/2013 FINAL   Final   Organism ID, Bacteria GROUP B STREP(S.AGALACTIAE)ISOLATED   Final  CULTURE, BLOOD (ROUTINE X 2)     Status: None   Collection Time    07/24/13 10:15 PM      Result Value Range Status   Specimen Description BLOOD RIGHT ANTECUBITAL   Final   Special Requests BOTTLES DRAWN AEROBIC AND ANAEROBIC 3CC   Final   Culture  Setup Time     Final   Value: 07/25/2013 06:27     Performed at [facility]   Culture     Final   Value: GROUP B STREP(S.AGALACTIAE)ISOLATED     Note: SUSCEPTIBILITIES PERFORMED ON PREVIOUS CULTURE WITHIN THE LAST 5 DAYS.     Note: Gram Stain Report Called to,Read Back By and Verified With: SHANNON YOUNG ON 07/25/2013 AT 9:53P BY WILEJ     Performed at [facility]   Report Status 07/27/2013 FINAL   Final  MRSA PCR SCREENING     Status: None   Collection Time    07/25/13  2:49 AM      Result Value Range Status   MRSA by PCR NEGATIVE  NEGATIVE Final   Comment:            The GeneXpert MRSA  Assay (FDA     approved for NASAL specimens     only), is one component of a     comprehensive MRSA colonization     surveillance program. It is not     intended to diagnose MRSA     infection nor to guide or     monitor treatment for     MRSA infections.  WOUND CULTURE     Status: None   Collection Time    07/26/13 12:50 PM  Result Value Range Status   Specimen Description FOOT   Final   Special Requests Normal   Final   Gram Stain     Final   Value: RARE WBC PRESENT, PREDOMINANTLY MONONUCLEAR     RARE SQUAMOUS EPITHELIAL CELLS PRESENT     NO ORGANISMS SEEN     Performed at [facility]   Culture     Final   Value: MULTIPLE ORGANISMS PRESENT, NONE PREDOMINANT NO STAPHYLOCOCCUS AUREUS ISOLATED NO GROUP A STREP (S.PYOGENES) ISOLATED     Performed at [facility]   Report Status 07/28/2013 FINAL   Final     Studies: No results found.  Scheduled Meds: . cefTRIAXone (ROCEPHIN)  IV  2 g Intravenous Q24H  . insulin aspart  0-20 Units Subcutaneous TID WC  . insulin aspart  0-5 Units Subcutaneous QHS  . insulin glargine  25 Units Subcutaneous BID  . living well with diabetes book   Does not apply Once  . nystatin cream   Topical BID  . silver sulfADIAZINE   Topical Daily   Continuous Infusions: . sodium chloride 100 mL/hr at 07/28/13 4782    Principal Problem:   Sepsis Active Problems:   DIABETES, TYPE 2   Morbid obesity, BMI unknown   Diabetic foot ulcer   ARF (acute renal failure)    Time spent:    [facility]  [facility] Pager [phone]. If 7PM-7AM, please contact night-coverage at [email or web address], password [facility] 07/28/2013, 10:40 AM  LOS: 4 days

## 2013-07-29 LAB — BASIC METABOLIC PANEL
BUN: 53 mg/dL — ABNORMAL HIGH (ref 6–23)
CO2: 16 mEq/L — ABNORMAL LOW (ref 19–32)
Chloride: 107 mEq/L (ref 96–112)
Creatinine, Ser: 2.3 mg/dL — ABNORMAL HIGH (ref 0.50–1.35)
GFR calc Af Amer: 41 mL/min — ABNORMAL LOW (ref >90)
Glucose, Bld: 69 mg/dL — ABNORMAL LOW (ref 70–99)
Potassium: 4.9 mEq/L (ref 3.5–5.1)
Sodium: 134 mEq/L — ABNORMAL LOW (ref 135–145)

## 2013-07-29 LAB — CBC
Hemoglobin: 7.9 g/dL — ABNORMAL LOW (ref 13.0–17.0)
MCH: 27.1 pg (ref 26.0–34.0)
Platelets: 410 10*3/uL — ABNORMAL HIGH (ref 150–400)
RBC: 2.92 MIL/uL — ABNORMAL LOW (ref 4.22–5.81)
RDW: 13.7 % (ref 11.5–15.5)
WBC: 23.4 10*3/uL — ABNORMAL HIGH (ref 4.0–10.5)

## 2013-07-29 LAB — GLUCOSE, CAPILLARY
Glucose-Capillary: 188 mg/dL — ABNORMAL HIGH (ref 70–99)
Glucose-Capillary: 163 mg/dL — ABNORMAL HIGH (ref 70–99)

## 2013-07-29 MED ORDER — INSULIN GLARGINE 100 UNIT/ML ~~LOC~~ SOLN
22.0000 [IU] | Freq: Two times a day (BID) | SUBCUTANEOUS | Status: DC
  Administered 2013-07-29 – 2013-08-02 (×8): 22 [IU] via SUBCUTANEOUS
  Filled 2013-07-29 (×10): qty 0.22

## 2013-07-29 NOTE — Progress Notes (Addendum)
[facility] PROGRESS NOTE  Adrian Khan ZOX:[id number] DOB: 1976-08-17 DOA: 07/24/2013 PCP: Jeri Cos, MD  Assessment/Plan: 1. Group B strep Sepsis/Bacteremia/L foot ulcer -concern for bone involvement per Xray, unable to check MRI due to weight limitations -Abx transitioned to Ceftriaxone yesterday -Ortho Consult appreciated, no need for debridement at this time per Dr.Duda -has necrotic area on R foot metatarsal head too -ID consult per Dr.VanDam appreciated -I d/w and consulted IR  for L shoulder aspiration, unfortunately Dr.Yamagata reviewed Recent Ct and Films and felt too difficult to reach L shoulder joint ?18cm deep from surface and yield would be very low due to low likelihood of reaching joint. -Called [facility] for Open MRI, no openings over weekend, will try again Monday  2. Morbid obesity -biggest challenge to his Health, d/w Mother and pt  3. Severe Uncontrolled DM -improved -Hbaic 13.7 -continue current dose of lantus BID, SSI -Dm coordinator consult noted  4. ARF on CKD -last baseline 1.3, likely from sepsis and NSAID use -hydrate, monitor, stop Ibuprofen -I/Os not recorded accurately, strict I/Os, pt declines foley -Ct without evidence of hydronephrosis -slow improvement in creatinine and Urine output picking up, expect this to improve further -cut down IVF  5. L shoulder pain and tenderness -had Xray 10/7 with Potential AC joint widening with suspected os acromiale or acromial fracture /Gout also possibility -see d/w per IR regarding inability to access L shoulder joint -had a CT shoulder 10/1 without definite abnormality, but limited study due to body habitus, unable to check MRI scan here -I will call [facility] on Monday and attempt to schedule Open MRI of L shoulder  6. Hyperkalemia -improved  DVT proph: Hep SQ  Code Status: Full Family Communication: d/w mother at bedside Disposition Plan: may need  Rehab  Consultants:  ORtho Dr.Duda  Antibiotics:  VAnc/Zosyn  HPI/Subjective: C/o pain in his back, L shoulder pain improving, able to move his arm better  Objective: Filed Vitals:   07/29/13 0614  BP: 157/91  Pulse: 106  Temp: 99.2 F (37.3 C)  Resp: 22    Intake/Output Summary (Last 24 hours) at 07/29/13 1056 Last data filed at 07/28/13 1811  Gross per 24 hour  Intake    225 ml  Output    300 ml  Net    -75 ml   Filed Weights   07/25/13 0551 07/25/13 1100  Weight: 187 kg (412 lb 4.2 oz) 185.3 kg (408 lb 8.2 oz)    Exam:   General: AAOx3, morbidly obese  Cardiovascular: S1S2/RRR  Respiratory: CTAB  Abdomen: soft, obese, with multiple folds  Musculoskeletal: 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, R foot base of 5th MTJ with necrotic toe   L shoulder with point tenderness medially and laterally, improved  Sacrum with skin breakdown, multiple small pressure ulcers  Data Reviewed: Basic Metabolic Panel:  Recent Labs Lab 07/25/13 0544 07/26/13 0525 07/27/13 0815 07/27/13 1150 07/28/13 0630 07/29/13 0550  NA 126* 127* 132*  --  132* 134*  K 4.7 5.8* 4.5  --  4.7 4.9  CL 94* 97 100  --  103 107  CO2 18* 14* 18*  --  16* 16*  GLUCOSE 384* 213* 176*  --  118* 69*  BUN 62* 67* 66*  --  61* 53*  CREATININE 2.45* 2.95* 2.87* 2.71* 2.43* 2.30*  CALCIUM 8.4 8.2* 7.9*  --  7.9* 8.0*   Liver Function Tests:  Recent Labs Lab 07/24/13 2142 07/25/13 0544  AST 14 12  ALT 8 7  ALKPHOS 149* 140*  BILITOT 0.4 0.5  PROT 8.5* 7.5  ALBUMIN 1.3* 1.1*   No results found for this basename: LIPASE, AMYLASE,  in the last 168 hours No results found for this basename: AMMONIA,  in the last 168 hours CBC:  Recent Labs Lab 07/25/13 0544 07/26/13 0525 07/27/13 0815 07/28/13 0630 07/29/13 0550  WBC 40.6* 40.4* 29.7* 27.1* 23.4*  HGB 8.3* 8.7* 7.8* 8.5* 7.9*  HCT 25.5* 26.3* 24.4* 26.8* 24.2*  MCV 82.3 81.7 82.4 84.5 82.9  PLT 457* 463*  456* 422* 410*   Cardiac Enzymes: No results found for this basename: CKTOTAL, CKMB, CKMBINDEX, TROPONINI,  in the last 168 hours BNP (last 3 results) No results found for this basename: PROBNP,  in the last 8760 hours CBG:  Recent Labs Lab 07/28/13 0730 07/28/13 1128 07/28/13 1654 07/28/13 2118 07/29/13 0739  GLUCAP 107* 243* 161* 105* 89    Recent Results (from the past 240 hour(s))  CULTURE, BLOOD (ROUTINE X 2)     Status: None   Collection Time    07/24/13 10:10 PM      Result Value Range Status   Specimen Description BLOOD RIGHT FOREARM   Final   Special Requests BOTTLES DRAWN AEROBIC AND ANAEROBIC 3CC   Final   Culture  Setup Time     Final   Value: 07/25/2013 06:28     Performed at [facility]   Culture     Final   Value: GROUP B STREP(S.AGALACTIAE)ISOLATED     Note: Gram Stain Report Called to,Read Back By and Verified With: SHANNON YOUNG ON 07/25/2013 AT 9:53P BY WILEJ     Performed at [facility]   Report Status 07/27/2013 FINAL   Final   Organism ID, Bacteria GROUP B STREP(S.AGALACTIAE)ISOLATED   Final  CULTURE, BLOOD (ROUTINE X 2)     Status: None   Collection Time    07/24/13 10:15 PM      Result Value Range Status   Specimen Description BLOOD RIGHT ANTECUBITAL   Final   Special Requests BOTTLES DRAWN AEROBIC AND ANAEROBIC 3CC   Final   Culture  Setup Time     Final   Value: 07/25/2013 06:27     Performed at [facility]   Culture     Final   Value: GROUP B STREP(S.AGALACTIAE)ISOLATED     Note: SUSCEPTIBILITIES PERFORMED ON PREVIOUS CULTURE WITHIN THE LAST 5 DAYS.     Note: Gram Stain Report Called to,Read Back By and Verified With: SHANNON YOUNG ON 07/25/2013 AT 9:53P BY WILEJ     Performed at [facility]   Report Status 07/27/2013 FINAL   Final  MRSA PCR SCREENING     Status: None   Collection Time    07/25/13  2:49 AM      Result Value Range Status   MRSA by PCR NEGATIVE  NEGATIVE Final   Comment:            The  GeneXpert MRSA Assay (FDA     approved for NASAL specimens     only), is one component of a     comprehensive MRSA colonization     surveillance program. It is not     intended to diagnose MRSA     infection nor to guide or     monitor treatment for     MRSA infections.  WOUND CULTURE     Status: None  Collection Time    07/26/13 12:50 PM      Result Value Range Status   Specimen Description FOOT   Final   Special Requests Normal   Final   Gram Stain     Final   Value: RARE WBC PRESENT, PREDOMINANTLY MONONUCLEAR     RARE SQUAMOUS EPITHELIAL CELLS PRESENT     NO ORGANISMS SEEN     Performed at [facility]   Culture     Final   Value: MULTIPLE ORGANISMS PRESENT, NONE PREDOMINANT NO STAPHYLOCOCCUS AUREUS ISOLATED NO GROUP A STREP (S.PYOGENES) ISOLATED     Performed at [facility]   Report Status 07/28/2013 FINAL   Final     Studies: No results found.  Scheduled Meds: . cefTRIAXone (ROCEPHIN)  IV  2 g Intravenous Q24H  . insulin aspart  0-20 Units Subcutaneous TID WC  . insulin aspart  0-5 Units Subcutaneous QHS  . insulin glargine  25 Units Subcutaneous BID  . living well with diabetes book   Does not apply Once  . nystatin cream   Topical BID  . silver sulfADIAZINE   Topical Daily   Continuous Infusions: . sodium chloride 75 mL/hr at 07/28/13 1127    Principal Problem:   Sepsis Active Problems:   DIABETES, TYPE 2   Morbid obesity, BMI unknown   Diabetic foot ulcer   ARF (acute renal failure)    Time spent:    [facility]  [facility] Pager [phone]. If 7PM-7AM, please contact night-coverage at [email or web address], password [facility] 07/29/2013, 10:56 AM  LOS: 5 days

## 2013-07-30 ENCOUNTER — Inpatient Hospital Stay (HOSPITAL_COMMUNITY): Payer: PRIVATE HEALTH INSURANCE

## 2013-07-30 DIAGNOSIS — R609 Edema, unspecified: Secondary | ICD-10-CM

## 2013-07-30 DIAGNOSIS — M25519 Pain in unspecified shoulder: Secondary | ICD-10-CM

## 2013-07-30 DIAGNOSIS — M79609 Pain in unspecified limb: Secondary | ICD-10-CM

## 2013-07-30 LAB — CBC
HCT: 23.5 % — ABNORMAL LOW (ref 39.0–52.0)
Hemoglobin: 7.5 g/dL — ABNORMAL LOW (ref 13.0–17.0)
MCH: 26.8 pg (ref 26.0–34.0)
MCHC: 31.9 g/dL (ref 30.0–36.0)
MCV: 83.9 fL (ref 78.0–100.0)
Platelets: 462 10*3/uL — ABNORMAL HIGH (ref 150–400)
RBC: 2.8 MIL/uL — ABNORMAL LOW (ref 4.22–5.81)
RDW: 14 % (ref 11.5–15.5)
WBC: 21.8 10*3/uL — ABNORMAL HIGH (ref 4.0–10.5)

## 2013-07-30 LAB — BASIC METABOLIC PANEL
BUN: 52 mg/dL — ABNORMAL HIGH (ref 6–23)
CO2: 11 mEq/L — ABNORMAL LOW (ref 19–32)
Calcium: 8.4 mg/dL (ref 8.4–10.5)
Chloride: 114 mEq/L — ABNORMAL HIGH (ref 96–112)
Creatinine, Ser: 2.34 mg/dL — ABNORMAL HIGH (ref 0.50–1.35)
GFR calc Af Amer: 40 mL/min — ABNORMAL LOW (ref >90)
GFR calc non Af Amer: 35 mL/min — ABNORMAL LOW (ref >90)
Glucose, Bld: 168 mg/dL — ABNORMAL HIGH (ref 70–99)
Potassium: 5.8 mEq/L — ABNORMAL HIGH (ref 3.5–5.1)
Sodium: 137 mEq/L (ref 135–145)

## 2013-07-30 LAB — GLUCOSE, CAPILLARY: Glucose-Capillary: 143 mg/dL — ABNORMAL HIGH (ref 70–99)

## 2013-07-30 MED ORDER — OXYCODONE HCL ER 15 MG PO T12A
15.0000 mg | EXTENDED_RELEASE_TABLET | Freq: Two times a day (BID) | ORAL | Status: DC
  Administered 2013-07-30 – 2013-08-02 (×5): 15 mg via ORAL
  Filled 2013-07-30 (×6): qty 1

## 2013-07-30 MED ORDER — SENNOSIDES-DOCUSATE SODIUM 8.6-50 MG PO TABS
1.0000 | ORAL_TABLET | Freq: Two times a day (BID) | ORAL | Status: DC | PRN
  Administered 2013-07-31: 1 via ORAL
  Filled 2013-07-30: qty 1

## 2013-07-30 MED ORDER — SODIUM CHLORIDE 0.9 % IJ SOLN
10.0000 mL | INTRAMUSCULAR | Status: DC | PRN
  Administered 2013-07-31 – 2013-08-02 (×2): 10 mL

## 2013-07-30 NOTE — Progress Notes (Signed)
[facility]   Day # 5 rocephin   Subjective: Shoulder still bothering him and left biceps now tender   Antibiotics:  Anti-infectives   Start     Dose/Rate Route Frequency Ordered Stop   07/26/13 1500  cefTRIAXone (ROCEPHIN) 2 g in dextrose 5 % 50 mL IVPB     2 g 100 mL/hr over 30 Minutes Intravenous Every 24 hours 07/26/13 1448     07/26/13 1300  ceFAZolin (ANCEF) IVPB 2 g/50 mL premix  Status:  Discontinued     2 g 100 mL/hr over 30 Minutes Intravenous Every 12 hours 07/26/13 1201 07/26/13 1448   07/25/13 1200  vancomycin (VANCOCIN) 2,000 mg in sodium chloride 0.9 % 500 mL IVPB  Status:  Discontinued     2,000 mg 250 mL/hr over 120 Minutes Intravenous Every 24 hours 07/25/13 0954 07/26/13 1448   07/25/13 1000  vancomycin (VANCOCIN) 1,500 mg in sodium chloride 0.9 % 500 mL IVPB  Status:  Discontinued     1,500 mg 250 mL/hr over 120 Minutes Intravenous Every 12 hours 07/25/13 0553 07/25/13 0952   07/25/13 1000  vancomycin (VANCOCIN) 2,000 mg in sodium chloride 0.9 % 500 mL IVPB  Status:  Discontinued     2,000 mg 250 mL/hr over 120 Minutes Intravenous Every 12 hours 07/25/13 0952 07/25/13 0954   07/25/13 0600  piperacillin-tazobactam (ZOSYN) IVPB 3.375 g  Status:  Discontinued     3.375 g 12.5 mL/hr over 240 Minutes Intravenous 3 times per day 07/25/13 0553 07/26/13 1101   07/24/13 2230  piperacillin-tazobactam (ZOSYN) IVPB 3.375 g     3.375 g 100 mL/hr over 30 Minutes Intravenous  Once 07/24/13 2226 07/25/13 0100   07/24/13 2230  vancomycin (VANCOCIN) 1,500 mg in sodium chloride 0.9 % 500 mL IVPB     1,500 mg 250 mL/hr over 120 Minutes Intravenous  Once 07/24/13 2226 07/25/13 0300      Medications: Scheduled Meds: . cefTRIAXone (ROCEPHIN)  IV  2 g Intravenous Q24H  . insulin aspart  0-20 Units Subcutaneous TID WC  . insulin aspart  0-5 Units Subcutaneous QHS  . insulin glargine  22 Units Subcutaneous BID  . living well with diabetes book    Does not apply Once  . nystatin cream   Topical BID  . OxyCODONE  15 mg Oral Q12H  . silver sulfADIAZINE   Topical Daily   Continuous Infusions: . sodium chloride 75 mL/hr at 07/29/13 2149   PRN Meds:.acetaminophen, HYDROmorphone (DILAUDID) injection, morphine injection, ondansetron (ZOFRAN) IV, ondansetron, oxyCODONE-acetaminophen, polyethylene glycol, senna-docusate   Objective: Weight change:   Intake/Output Summary (Last 24 hours) at 07/30/13 1318 Last data filed at 07/30/13 1135  Gross per 24 hour  Intake    720 ml  Output   1975 ml  Net  -1255 ml   Blood pressure 154/80, pulse 98, temperature 98.9 F (37.2 C), temperature source Oral, resp. rate 18, height 5\' 11"  (1.803 m), weight 408 lb 8.2 oz (185.3 kg), SpO2 98.00%. Temp:  [98.2 F (36.8 C)-99.2 F (37.3 C)] 98.9 F (37.2 C) (10/13 0520) Pulse Rate:  [90-101] 98 (10/13 0637) Resp:  [18-20] 18 (10/13 0520) BP: (130-154)/(38-82) 154/80 mmHg (10/13 0637) SpO2:  [94 %-98 %] 98 % (10/13 0520)  Physical Exam: General: Alert and awake, oriented x3, not in any acute distress.  HEENT: anicteric sclera, pupils reactive to light and accommodation, EOMI, oropharynx clear and without exudate  CVS regular rate, normal r, no murmur  rubs or gallops  Chest: clear to auscultation bilaterally, no wheezing, rales or rhonchi  Abdomen: soft nontender, nondistended, normal bowel sounds,  Extremities: left shoulder is tender to palpationnot warm, biceps muscle is tender Feet dressed Skin: no rashes  Neuro: nonfocal, strength and sensation intact   Lab Results:  Recent Labs  07/29/13 0550 07/30/13 0500  WBC 23.4* 21.8*  HGB 7.9* 7.5*  HCT 24.2* 23.5*  PLT 410* 462*    BMET  Recent Labs  07/29/13 0550 07/30/13 0850  NA 134* 137  K 4.9 5.8*  CL 107 114*  CO2 16* 11*  GLUCOSE 69* 168*  BUN 53* 52*  CREATININE 2.30* 2.34*  CALCIUM 8.0* 8.4    Micro Results: Recent Results (from the past 240 hour(s))  CULTURE,  BLOOD (ROUTINE X 2)     Status: None   Collection Time    07/24/13 10:10 PM      Result Value Range Status   Specimen Description BLOOD RIGHT FOREARM   Final   Special Requests BOTTLES DRAWN AEROBIC AND ANAEROBIC 3CC   Final   Culture  Setup Time     Final   Value: 07/25/2013 06:28     Performed at [facility]   Culture     Final   Value: GROUP B STREP(S.AGALACTIAE)ISOLATED     Note: Gram Stain Report Called to,Read Back By and Verified With: SHANNON YOUNG ON 07/25/2013 AT 9:53P BY WILEJ     Performed at [facility]   Report Status 07/27/2013 FINAL   Final   Organism ID, Bacteria GROUP B STREP(S.AGALACTIAE)ISOLATED   Final  CULTURE, BLOOD (ROUTINE X 2)     Status: None   Collection Time    07/24/13 10:15 PM      Result Value Range Status   Specimen Description BLOOD RIGHT ANTECUBITAL   Final   Special Requests BOTTLES DRAWN AEROBIC AND ANAEROBIC 3CC   Final   Culture  Setup Time     Final   Value: 07/25/2013 06:27     Performed at [facility]   Culture     Final   Value: GROUP B STREP(S.AGALACTIAE)ISOLATED     Note: SUSCEPTIBILITIES PERFORMED ON PREVIOUS CULTURE WITHIN THE LAST 5 DAYS.     Note: Gram Stain Report Called to,Read Back By and Verified With: SHANNON YOUNG ON 07/25/2013 AT 9:53P BY WILEJ     Performed at [facility]   Report Status 07/27/2013 FINAL   Final  MRSA PCR SCREENING     Status: None   Collection Time    07/25/13  2:49 AM      Result Value Range Status   MRSA by PCR NEGATIVE  NEGATIVE Final   Comment:            The GeneXpert MRSA Assay (FDA     approved for NASAL specimens     only), is one component of a     comprehensive MRSA colonization     surveillance program. It is not     intended to diagnose MRSA     infection nor to guide or     monitor treatment for     MRSA infections.  WOUND CULTURE     Status: None   Collection Time    07/26/13 12:50 PM      Result Value Range Status   Specimen Description FOOT    Final   Special Requests Normal   Final   Gram Stain  Final   Value: RARE WBC PRESENT, PREDOMINANTLY MONONUCLEAR     RARE SQUAMOUS EPITHELIAL CELLS PRESENT     NO ORGANISMS SEEN     Performed at [facility]   Culture     Final   Value: MULTIPLE ORGANISMS PRESENT, NONE PREDOMINANT NO STAPHYLOCOCCUS AUREUS ISOLATED NO GROUP A STREP (S.PYOGENES) ISOLATED     Performed at [facility]   Report Status 07/28/2013 FINAL   Final    Studies/Results: No results found.    Assessment/Plan: CRIAG WICKLUND is a 37 y.o. 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 Yes he has elevated uric acid but he also has Bacteremia so I have high anxiety about his left shoulder being infected IR was unable to perform aspiration of the joint.  With pain in the left arm, increasing edema concern rises for pyomyositis in arm in additio n to septic shoulder  HE needs MRI shoulder  (would consider d/w admin given that hold up in imaging is effecting lOs, etc)   One could also consider giving steroids trial to see if improves shoulder pain but given biceps tenderness now present which is not c/w gout I would worry th at steroids  Could make this worse  Would consider doppler to look for thrombus int he left arm as well  For now will continue rocephin for 6 weeks ( 6 weeks post op if he goes to OR ultimately)  --Regardless would treat for 6  --Ok with PICC vs central line     #2 Septic shoulder vs gout flare: need joint fluid to distinguish two curious she feels better without gout therapy OR definitive surgical intervention at this point. Would try to push to get MRI asap if we are to help solve this problem. Would also consider reconsult ortho shoulder  #3 AMputation site infection: Dr Lajoyce Corners  following.     LOS: 6 days   Acey Lav 07/30/2013, 1:18 PM

## 2013-07-30 NOTE — Progress Notes (Signed)
PT Cancellation Note  Patient Details Name: Adrian Khan MRN: [id number] DOB: 06-02-1976   Cancelled Treatment:    Reason Eval/Treat Not Completed: Pain limiting ability to participate-pt declines to participate at this time. Will check back on pt tomorrow to see if pt is willing/able to participate-pt in agreement with PT checking back tomorrow. Thanks.    Rebeca Alert, MPT Pager: [phone]

## 2013-07-30 NOTE — Progress Notes (Signed)
Clinical Social Work  MD requested CSW speak with patient regarding SNF placement. CSW met with patient and mom in room. Patient refuses SNF placement and wants to go home with [facility]. CSW is signing off but available if further needs arise.  Unk Lightning, LCSW (Coverage for [employer])

## 2013-07-30 NOTE — Progress Notes (Signed)
Peripherally Inserted Central Catheter/Midline Placement  The IV Nurse has discussed with the patient and/or persons authorized to consent for the patient, the purpose of this procedure and the potential benefits and risks involved with this procedure.  The benefits include less needle sticks, lab draws from the catheter and patient may be discharged home with the catheter.  Risks include, but not limited to, infection, bleeding, blood clot (thrombus formation), and puncture of an artery; nerve damage and irregular heat beat.  Alternatives to this procedure were also discussed.  Patient requested that mother sign consent.  PICC/Midline Placement Documentation  PICC / Midline Single Lumen 07/30/13 PICC Right Cephalic 46 cm 0 cm (Active)  Indication for Insertion or Continuance of Line Limited venous access - need for IV therapy >5 days (PICC only) 07/30/2013  4:28 PM  Exposed Catheter (cm) 0 cm 07/30/2013  4:28 PM  Site Assessment Clean;Dry;Intact 07/30/2013  4:28 PM  Line Status Flushed;Saline locked 07/30/2013  4:28 PM  Dressing Type Transparent 07/30/2013  4:28 PM  Dressing Status Clean;Dry;Intact;Antimicrobial disc in place 07/30/2013  4:28 PM  Dressing Change Due 08/06/13 07/30/2013  4:28 PM       Rini Moffit, Lajean Manes 07/30/2013, 4:29 PM

## 2013-07-30 NOTE — Progress Notes (Addendum)
[facility] PROGRESS NOTE  Adrian Khan ZOX:[id number] DOB: May 16, 1976 DOA: 07/24/2013 PCP: Jeri Cos, MD  Assessment/Plan: 1. Group B strep Sepsis/Bacteremia/L foot ulcer -concern for bone involvement per Xray, unable to check MRI due to weight limitations -Abx transitioned to Ceftriaxone 10/10, need 6 weeks of Ceftriaxone per ID -Ortho Consult appreciated, no need for debridement at this time per Dr.Duda -has necrotic area on R foot metatarsal head too -ID consult per Dr.VanDam appreciated -I d/w and consulted IR  for L shoulder aspiration, unfortunately Dr.Yamagata reviewed Recent Ct and Films and felt too difficult to reach L shoulder joint ?18cm deep from surface and yield would be very low due to low likelihood of reaching joint. -Called [facility] for Open MRI, they are unable to scan inpatients -Place PICC -d/w Dr.VanDam again, will attempt CT shoulder and Humerus  2. Morbid obesity -biggest challenge to his Health, d/w Mother and pt  3. Severe Uncontrolled DM -improved -Hbaic 13.7 -continue current dose of lantus BID, SSI -Dm coordinator consult noted -insulin teaching for home  4. ARF on CKD -last baseline 1.3, likely from sepsis and NSAID use -hydrate, monitor, stopped Ibuprofen -I/Os not recorded accurately, strict I/Os, pt declines foley -Ct without evidence of hydronephrosis -slow improvement in creatinine and Urine output good, expect this to improve further -continue IVF at 75cc/hr  5. L shoulder pain and tenderness -had Xray 10/7 with Potential AC joint widening with suspected os acromiale or acromial fracture /Gout also possibility -see d/w per IR regarding inability to access L shoulder joint -had a CT shoulder 10/1 without definite abnormality, but limited study due to body habitus, unable to check MRI scan here -I called [facility] for Open MRI unable to do this on inpatients, see above plan for CT shoulder   6.  Hyperkalemia -improved  DVT proph: Hep SQ  Code Status: Full Family Communication: d/w mother at bedside Disposition Plan: declines Rehab, despite multiple discussions regarding this  Consultants:  ORtho Dr.Duda  Antibiotics:  VAnc/Zosyn  HPI/Subjective: C/o pain in his back, L shoulder pain improving, able to move his arm better  Objective: Filed Vitals:   07/30/13 0637  BP: 154/80  Pulse: 98  Temp:   Resp:     Intake/Output Summary (Last 24 hours) at 07/30/13 1049 Last data filed at 07/29/13 2029  Gross per 24 hour  Intake    360 ml  Output   2375 ml  Net  -2015 ml   Filed Weights   07/25/13 0551 07/25/13 1100  Weight: 187 kg (412 lb 4.2 oz) 185.3 kg (408 lb 8.2 oz)    Exam:   General: AAOx3, morbidly obese  Cardiovascular: S1S2/RRR  Respiratory: CTAB  Abdomen: soft, obese, with multiple folds  Musculoskeletal: 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, R foot base of 5th MTJ with necrotic toe   L shoulder with point tenderness medially and laterally, improved  Sacrum with skin breakdown, multiple small pressure ulcers  Data Reviewed: Basic Metabolic Panel:  Recent Labs Lab 07/26/13 0525 07/27/13 0815 07/27/13 1150 07/28/13 0630 07/29/13 0550 07/30/13 0850  NA 127* 132*  --  132* 134* 137  K 5.8* 4.5  --  4.7 4.9 5.8*  CL 97 100  --  103 107 114*  CO2 14* 18*  --  16* 16* 11*  GLUCOSE 213* 176*  --  118* 69* 168*  BUN 67* 66*  --  61* 53* 52*  CREATININE 2.95* 2.87* 2.71* 2.43* 2.30* 2.34*  CALCIUM  8.2* 7.9*  --  7.9* 8.0* 8.4   Liver Function Tests:  Recent Labs Lab 07/24/13 2142 07/25/13 0544  AST 14 12  ALT 8 7  ALKPHOS 149* 140*  BILITOT 0.4 0.5  PROT 8.5* 7.5  ALBUMIN 1.3* 1.1*   No results found for this basename: LIPASE, AMYLASE,  in the last 168 hours No results found for this basename: AMMONIA,  in the last 168 hours CBC:  Recent Labs Lab 07/26/13 0525 07/27/13 0815 07/28/13 0630 07/29/13 0550  07/30/13 0500  WBC 40.4* 29.7* 27.1* 23.4* 21.8*  HGB 8.7* 7.8* 8.5* 7.9* 7.5*  HCT 26.3* 24.4* 26.8* 24.2* 23.5*  MCV 81.7 82.4 84.5 82.9 83.9  PLT 463* 456* 422* 410* 462*   Cardiac Enzymes: No results found for this basename: CKTOTAL, CKMB, CKMBINDEX, TROPONINI,  in the last 168 hours BNP (last 3 results) No results found for this basename: PROBNP,  in the last 8760 hours CBG:  Recent Labs Lab 07/29/13 0739 07/29/13 1124 07/29/13 1652 07/29/13 2140 07/30/13 0715  GLUCAP 89 116* 188* 163* 143*    Recent Results (from the past 240 hour(s))  CULTURE, BLOOD (ROUTINE X 2)     Status: None   Collection Time    07/24/13 10:10 PM      Result Value Range Status   Specimen Description BLOOD RIGHT FOREARM   Final   Special Requests BOTTLES DRAWN AEROBIC AND ANAEROBIC 3CC   Final   Culture  Setup Time     Final   Value: 07/25/2013 06:28     Performed at [facility]   Culture     Final   Value: GROUP B STREP(S.AGALACTIAE)ISOLATED     Note: Gram Stain Report Called to,Read Back By and Verified With: SHANNON YOUNG ON 07/25/2013 AT 9:53P BY WILEJ     Performed at [facility]   Report Status 07/27/2013 FINAL   Final   Organism ID, Bacteria GROUP B STREP(S.AGALACTIAE)ISOLATED   Final  CULTURE, BLOOD (ROUTINE X 2)     Status: None   Collection Time    07/24/13 10:15 PM      Result Value Range Status   Specimen Description BLOOD RIGHT ANTECUBITAL   Final   Special Requests BOTTLES DRAWN AEROBIC AND ANAEROBIC 3CC   Final   Culture  Setup Time     Final   Value: 07/25/2013 06:27     Performed at [facility]   Culture     Final   Value: GROUP B STREP(S.AGALACTIAE)ISOLATED     Note: SUSCEPTIBILITIES PERFORMED ON PREVIOUS CULTURE WITHIN THE LAST 5 DAYS.     Note: Gram Stain Report Called to,Read Back By and Verified With: SHANNON YOUNG ON 07/25/2013 AT 9:53P BY WILEJ     Performed at [facility]   Report Status 07/27/2013 FINAL   Final  MRSA PCR  SCREENING     Status: None   Collection Time    07/25/13  2:49 AM      Result Value Range Status   MRSA by PCR NEGATIVE  NEGATIVE Final   Comment:            The GeneXpert MRSA Assay (FDA     approved for NASAL specimens     only), is one component of a     comprehensive MRSA colonization     surveillance program. It is not     intended to diagnose MRSA     infection nor to guide or  monitor treatment for     MRSA infections.  WOUND CULTURE     Status: None   Collection Time    07/26/13 12:50 PM      Result Value Range Status   Specimen Description FOOT   Final   Special Requests Normal   Final   Gram Stain     Final   Value: RARE WBC PRESENT, PREDOMINANTLY MONONUCLEAR     RARE SQUAMOUS EPITHELIAL CELLS PRESENT     NO ORGANISMS SEEN     Performed at [facility]   Culture     Final   Value: MULTIPLE ORGANISMS PRESENT, NONE PREDOMINANT NO STAPHYLOCOCCUS AUREUS ISOLATED NO GROUP A STREP (S.PYOGENES) ISOLATED     Performed at [facility]   Report Status 07/28/2013 FINAL   Final     Studies: No results found.  Scheduled Meds: . cefTRIAXone (ROCEPHIN)  IV  2 g Intravenous Q24H  . insulin aspart  0-20 Units Subcutaneous TID WC  . insulin aspart  0-5 Units Subcutaneous QHS  . insulin glargine  22 Units Subcutaneous BID  . living well with diabetes book   Does not apply Once  . nystatin cream   Topical BID  . silver sulfADIAZINE   Topical Daily   Continuous Infusions: . sodium chloride 75 mL/hr at 07/29/13 2149    Principal Problem:   Sepsis Active Problems:   DIABETES, TYPE 2   Morbid obesity, BMI unknown   Diabetic foot ulcer   ARF (acute renal failure)    Time spent:    [facility]  [facility] Pager [phone]. If 7PM-7AM, please contact night-coverage at [email or web address], password [facility] 07/30/2013, 10:49 AM  LOS: 6 days

## 2013-07-30 NOTE — Progress Notes (Signed)
I spoke with pt's mother this morning about d/c planning. I gave her the # to call and find a new PCP for him. I also told her she can call the customer service # for his insurance to see who is in-network. She is also interested in getting a ramp built at home. She stated that Medicaid can pay for it. [facility] cannot file insurance for it. I advised her to call his Medicaid Case worker to see what agency she can use.   [facility] has been made aware that he will need IV antibiotics x 6 weeks at d/c.  [name] RN BSN [phone].

## 2013-07-30 NOTE — Progress Notes (Signed)
[facility]  Patient Status:   Mr. Delfavero is a new pt to [facility] this admission  [facility] is providing the following services:   Pt will receive [facility] RN and home infusion pharmacy services for home IV antibiotics.  [facility] hospital team will follow and support DC to home when deemed appropriate by MD.  If patient discharges after hours, please call [phone].   Sedalia Muta 07/30/2013, 11:11 AM

## 2013-07-31 ENCOUNTER — Ambulatory Visit (HOSPITAL_COMMUNITY): Admit: 2013-07-31 | Payer: PRIVATE HEALTH INSURANCE

## 2013-07-31 ENCOUNTER — Inpatient Hospital Stay (HOSPITAL_COMMUNITY): Payer: PRIVATE HEALTH INSURANCE

## 2013-07-31 LAB — BASIC METABOLIC PANEL
BUN: 47 mg/dL — ABNORMAL HIGH (ref 6–23)
Chloride: 108 mEq/L (ref 96–112)
Glucose, Bld: 147 mg/dL — ABNORMAL HIGH (ref 70–99)
Potassium: 4.5 mEq/L (ref 3.5–5.1)

## 2013-07-31 LAB — GLUCOSE, CAPILLARY: Glucose-Capillary: 107 mg/dL — ABNORMAL HIGH (ref 70–99)

## 2013-07-31 LAB — CBC
HCT: 23.3 % — ABNORMAL LOW (ref 39.0–52.0)
Hemoglobin: 7.3 g/dL — ABNORMAL LOW (ref 13.0–17.0)
MCV: 85.3 fL (ref 78.0–100.0)
RBC: 2.73 MIL/uL — ABNORMAL LOW (ref 4.22–5.81)
WBC: 19.9 10*3/uL — ABNORMAL HIGH (ref 4.0–10.5)

## 2013-07-31 LAB — IRON AND TIBC
Iron: 12 ug/dL — ABNORMAL LOW (ref 42–135)
UIBC: 93 ug/dL — ABNORMAL LOW (ref 125–400)

## 2013-07-31 LAB — FOLATE: Folate: 6.3 ng/mL

## 2013-07-31 LAB — VITAMIN B12: Vitamin B-12: 593 pg/mL (ref 211–911)

## 2013-07-31 LAB — RETICULOCYTES: Retic Count, Absolute: 60.1 10*3/uL (ref 19.0–186.0)

## 2013-07-31 MED ORDER — MORPHINE SULFATE 2 MG/ML IJ SOLN
2.0000 mg | INTRAMUSCULAR | Status: DC | PRN
  Administered 2013-07-31 (×2): 2 mg via INTRAVENOUS
  Administered 2013-08-01: 4 mg via INTRAVENOUS
  Administered 2013-08-01 – 2013-08-02 (×2): 2 mg via INTRAVENOUS
  Administered 2013-08-02 (×2): 4 mg via INTRAVENOUS
  Filled 2013-07-31: qty 1
  Filled 2013-07-31: qty 2
  Filled 2013-07-31: qty 1
  Filled 2013-07-31 (×3): qty 2

## 2013-07-31 MED ORDER — MORPHINE SULFATE 4 MG/ML IJ SOLN: 6.0000 mg | INTRAMUSCULAR | Status: AC

## 2013-07-31 NOTE — Progress Notes (Addendum)
[facility] PROGRESS NOTE  Adrian Khan ZOX:[id number] DOB: 01/27/1976 DOA: 07/24/2013 PCP: Jeri Cos, MD Brief Narrative Adrian Khan is a 36/M with PMH of Uncontrolled DM, Morbid Obesity, TMT amputation of L foot was admitted with Sepsis, Acute Renal Failure, L foot ulcer, Uncontrolled DM and L shoulder pain with limited movement of L arm and bedridden status x 4weeks. He was found to have GBS Bacteremia, is being followed by Ortho Dr.Duda and ID Dr.Van Dam. Care has been limited by decreased ability to image his possibly affected joint due to weight restrictions and patient compliance, see details below.  Assessment/Plan: 1. Group B strep Sepsis/Bacteremia/L foot ulcer -concern for bone involvement per Xray, unable to check MRI due to weight limitations  -Ortho Consult appreciated, no need for debridement at this time per Dr.Duda -has necrotic area on R foot metatarsal head too -Abx transitioned to Ceftriaxone 10/10, needs 6 weeks of Ceftriaxone per ID,  -PICC placed 10/13 -ID consult per Dr.VanDam appreciated  2. Morbid obesity -biggest challenge to his Health, d/w Mother and pt  3. Severe Uncontrolled DM -improved -Hbaic 13.7 -continue current dose of lantus BID, SSI -Dm coordinator consult noted -insulin teaching for home  4. ARF on CKD -last baseline 1.3, from sepsis and NSAID use -hydrate, monitor, stopped Ibuprofen -I/Os not recorded accurately, strict I/Os, pt declines foley -Ct without evidence of hydronephrosis -slow improvement in creatinine and Urine output good, now down to 2.0 from 2.9, may continue improve further -continue IVF at 75cc/hr  5. L shoulder pain and tenderness -had Xray 10/7 with Potential AC joint widening with suspected os acromiale or acromial fracture /Gout also possibility -I d/w and consulted IR  for L shoulder aspiration, unfortunately Dr.Yamagata reviewed Recent Ct and Films and felt too difficult to reach L shoulder joint  ?18cm deep from surface and yield would be very low due to low likelihood of reaching joint. -Called [facility] for Open MRI, they are unable to scan inpatients -had a CT shoulder 10/1 in [facility] prior to this admission without definite abnormality, but limited study due to body habitus, unable to check MRI scan here --d/w Dr.VanDam again 10/13, ordered CT shoulder and Humerus yesterday to r/o septic arthritis, still pending due to logistics  6. Hyperkalemia -improved  DVT proph: Hep SQ  Code Status: Full Family Communication: d/w mother at bedside Disposition Plan: declines Rehab, despite multiple discussions regarding this, will need HH services  Consultants:  ORtho Dr.Duda  Antibiotics:  VAnc/Zosyn  HPI/Subjective: C/o pain in his back, L shoulder pain still present but improving  Objective: Filed Vitals:   07/31/13 0603  BP: 172/63  Pulse: 95  Temp: 98.8 F (37.1 C)  Resp: 20    Intake/Output Summary (Last 24 hours) at 07/31/13 1022 Last data filed at 07/31/13 0604  Gross per 24 hour  Intake    120 ml  Output    250 ml  Net   -130 ml   Filed Weights   07/25/13 0551 07/25/13 1100  Weight: 187 kg (412 lb 4.2 oz) 185.3 kg (408 lb 8.2 oz)    Exam:   General: AAOx3, morbidly obese  Cardiovascular: S1S2/RRR  Respiratory: distant breath sounds  Abdomen: soft, obese, with multiple folds  Musculoskeletal: 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, R foot base of 5th MTJ with necrotic toe   L shoulder with point tenderness medially and laterally, improved  Sacrum with skin breakdown, multiple small pressure ulcers  Data Reviewed: Basic Metabolic Panel:  Recent Labs Lab 07/27/13 0815 07/27/13 1150 07/28/13 0630 07/29/13 0550 07/30/13 0850 07/31/13 0405  NA 132*  --  132* 134* 137 136  K 4.5  --  4.7 4.9 5.8* 4.5  CL 100  --  103 107 114* 108  CO2 18*  --  16* 16* 11* 18*  GLUCOSE 176*  --  118* 69* 168* 147*  BUN 66*  --   61* 53* 52* 47*  CREATININE 2.87* 2.71* 2.43* 2.30* 2.34* 2.07*  CALCIUM 7.9*  --  7.9* 8.0* 8.4 9.0   Liver Function Tests:  Recent Labs Lab 07/24/13 2142 07/25/13 0544  AST 14 12  ALT 8 7  ALKPHOS 149* 140*  BILITOT 0.4 0.5  PROT 8.5* 7.5  ALBUMIN 1.3* 1.1*   No results found for this basename: LIPASE, AMYLASE,  in the last 168 hours No results found for this basename: AMMONIA,  in the last 168 hours CBC:  Recent Labs Lab 07/27/13 0815 07/28/13 0630 07/29/13 0550 07/30/13 0500 07/31/13 0405  WBC 29.7* 27.1* 23.4* 21.8* 19.9*  HGB 7.8* 8.5* 7.9* 7.5* 7.3*  HCT 24.4* 26.8* 24.2* 23.5* 23.3*  MCV 82.4 84.5 82.9 83.9 85.3  PLT 456* 422* 410* 462* 554*   Cardiac Enzymes: No results found for this basename: CKTOTAL, CKMB, CKMBINDEX, TROPONINI,  in the last 168 hours BNP (last 3 results) No results found for this basename: PROBNP,  in the last 8760 hours CBG:  Recent Labs Lab 07/30/13 0715 07/30/13 1145 07/30/13 1651 07/30/13 1952 07/31/13 0719  GLUCAP 143* 153* 146* 162* 137*    Recent Results (from the past 240 hour(s))  CULTURE, BLOOD (ROUTINE X 2)     Status: None   Collection Time    07/24/13 10:10 PM      Result Value Range Status   Specimen Description BLOOD RIGHT FOREARM   Final   Special Requests BOTTLES DRAWN AEROBIC AND ANAEROBIC 3CC   Final   Culture  Setup Time     Final   Value: 07/25/2013 06:28     Performed at [facility]   Culture     Final   Value: GROUP B STREP(S.AGALACTIAE)ISOLATED     Note: Gram Stain Report Called to,Read Back By and Verified With: SHANNON YOUNG ON 07/25/2013 AT 9:53P BY WILEJ     Performed at [facility]   Report Status 07/27/2013 FINAL   Final   Organism ID, Bacteria GROUP B STREP(S.AGALACTIAE)ISOLATED   Final  CULTURE, BLOOD (ROUTINE X 2)     Status: None   Collection Time    07/24/13 10:15 PM      Result Value Range Status   Specimen Description BLOOD RIGHT ANTECUBITAL   Final   Special  Requests BOTTLES DRAWN AEROBIC AND ANAEROBIC 3CC   Final   Culture  Setup Time     Final   Value: 07/25/2013 06:27     Performed at [facility]   Culture     Final   Value: GROUP B STREP(S.AGALACTIAE)ISOLATED     Note: SUSCEPTIBILITIES PERFORMED ON PREVIOUS CULTURE WITHIN THE LAST 5 DAYS.     Note: Gram Stain Report Called to,Read Back By and Verified With: SHANNON YOUNG ON 07/25/2013 AT 9:53P BY Serafina Mitchell     Performed at [facility]   Report Status 07/27/2013 FINAL   Final  MRSA PCR SCREENING     Status: None   Collection Time    07/25/13  2:49 AM  Result Value Range Status   MRSA by PCR NEGATIVE  NEGATIVE Final   Comment:            The GeneXpert MRSA Assay (FDA     approved for NASAL specimens     only), is one component of a     comprehensive MRSA colonization     surveillance program. It is not     intended to diagnose MRSA     infection nor to guide or     monitor treatment for     MRSA infections.  WOUND CULTURE     Status: None   Collection Time    07/26/13 12:50 PM      Result Value Range Status   Specimen Description FOOT   Final   Special Requests Normal   Final   Gram Stain     Final   Value: RARE WBC PRESENT, PREDOMINANTLY MONONUCLEAR     RARE SQUAMOUS EPITHELIAL CELLS PRESENT     NO ORGANISMS SEEN     Performed at [facility]   Culture     Final   Value: MULTIPLE ORGANISMS PRESENT, NONE PREDOMINANT NO STAPHYLOCOCCUS AUREUS ISOLATED NO GROUP A STREP (S.PYOGENES) ISOLATED     Performed at [facility]   Report Status 07/28/2013 FINAL   Final     Studies: No results found.  Scheduled Meds: . cefTRIAXone (ROCEPHIN)  IV  2 g Intravenous Q24H  . insulin aspart  0-20 Units Subcutaneous TID WC  . insulin aspart  0-5 Units Subcutaneous QHS  . insulin glargine  22 Units Subcutaneous BID  . living well with diabetes book   Does not apply Once  . nystatin cream   Topical BID  . OxyCODONE  15 mg Oral Q12H  . silver sulfADIAZINE    Topical Daily   Continuous Infusions: . sodium chloride 75 mL/hr at 07/29/13 2149    Principal Problem:   Sepsis Active Problems:   DIABETES, TYPE 2   Morbid obesity, BMI unknown   Diabetic foot ulcer   ARF (acute renal failure)    Time spent:    [facility]  [facility] Pager [phone]. If 7PM-7AM, please contact night-coverage at [email or web address], password [facility] 07/31/2013, 10:22 AM  LOS: 7 days

## 2013-07-31 NOTE — Progress Notes (Signed)
I met with pt's mother Corrie Dandy to follow up on new PCP for pt. She reported to me she has decided to stay with Dr Margaretmary Bayley as his PCP at this time.  [name], RN BSN   [phone]

## 2013-07-31 NOTE — Progress Notes (Signed)
[facility]    Day # 6 rocephin   Subjective: Shoulder much WORSE  and left biceps now tender   Antibiotics:  Anti-infectives   Start     Dose/Rate Route Frequency Ordered Stop   07/26/13 1500  cefTRIAXone (ROCEPHIN) 2 g in dextrose 5 % 50 mL IVPB     2 g 100 mL/hr over 30 Minutes Intravenous Every 24 hours 07/26/13 1448     07/26/13 1300  ceFAZolin (ANCEF) IVPB 2 g/50 mL premix  Status:  Discontinued     2 g 100 mL/hr over 30 Minutes Intravenous Every 12 hours 07/26/13 1201 07/26/13 1448   07/25/13 1200  vancomycin (VANCOCIN) 2,000 mg in sodium chloride 0.9 % 500 mL IVPB  Status:  Discontinued     2,000 mg 250 mL/hr over 120 Minutes Intravenous Every 24 hours 07/25/13 0954 07/26/13 1448   07/25/13 1000  vancomycin (VANCOCIN) 1,500 mg in sodium chloride 0.9 % 500 mL IVPB  Status:  Discontinued     1,500 mg 250 mL/hr over 120 Minutes Intravenous Every 12 hours 07/25/13 0553 07/25/13 0952   07/25/13 1000  vancomycin (VANCOCIN) 2,000 mg in sodium chloride 0.9 % 500 mL IVPB  Status:  Discontinued     2,000 mg 250 mL/hr over 120 Minutes Intravenous Every 12 hours 07/25/13 0952 07/25/13 0954   07/25/13 0600  piperacillin-tazobactam (ZOSYN) IVPB 3.375 g  Status:  Discontinued     3.375 g 12.5 mL/hr over 240 Minutes Intravenous 3 times per day 07/25/13 0553 07/26/13 1101   07/24/13 2230  piperacillin-tazobactam (ZOSYN) IVPB 3.375 g     3.375 g 100 mL/hr over 30 Minutes Intravenous  Once 07/24/13 2226 07/25/13 0100   07/24/13 2230  vancomycin (VANCOCIN) 1,500 mg in sodium chloride 0.9 % 500 mL IVPB     1,500 mg 250 mL/hr over 120 Minutes Intravenous  Once 07/24/13 2226 07/25/13 0300      Medications: Scheduled Meds: . cefTRIAXone (ROCEPHIN)  IV  2 g Intravenous Q24H  . insulin aspart  0-20 Units Subcutaneous TID WC  . insulin aspart  0-5 Units Subcutaneous QHS  . insulin glargine  22 Units Subcutaneous BID  . living well with diabetes book   Does  not apply Once  .  morphine injection  6 mg Intravenous NOW  . nystatin cream   Topical BID  . OxyCODONE  15 mg Oral Q12H  . silver sulfADIAZINE   Topical Daily   Continuous Infusions: . sodium chloride 50 mL/hr at 07/31/13 1343   PRN Meds:.acetaminophen, morphine injection, ondansetron (ZOFRAN) IV, ondansetron, oxyCODONE-acetaminophen, polyethylene glycol, senna-docusate, sodium chloride   Objective: Weight change:   Intake/Output Summary (Last 24 hours) at 07/31/13 1504 Last data filed at 07/31/13 0715  Gross per 24 hour  Intake      0 ml  Output    550 ml  Net   -550 ml   Blood pressure 172/63, pulse 95, temperature 98.8 F (37.1 C), temperature source Oral, resp. rate 20, height 5\' 11"  (1.803 m), weight 408 lb 8.2 oz (185.3 kg), SpO2 100.00%. Temp:  [98.6 F (37 C)-98.8 F (37.1 C)] 98.8 F (37.1 C) (10/14 0603) Pulse Rate:  [94-95] 95 (10/14 0603) Resp:  [20] 20 (10/14 0603) BP: (153-172)/(63-69) 172/63 mmHg (10/14 0603) SpO2:  [99 %-100 %] 100 % (10/14 0603)  Physical Exam: General: Alert and awake, oriented x3, not in any acute distress.  HEENT: anicteric sclera, pupils reactive to light and accommodation,  EOMI, oropharynx clear and without exudate  CVS regular rate, normal r, no murmur rubs or gallops  Chest: clear to auscultation bilaterally, no wheezing, rales or rhonchi  Abdomen: soft nontender, nondistended, normal bowel sounds,  Extremities: left shoulder is tender to palpationnot warm, biceps muscle is tender Feet dressed Skin: no rashes  Neuro: nonfocal, strength and sensation intact   Lab Results:  Recent Labs  07/30/13 0500 07/31/13 0405  WBC 21.8* 19.9*  HGB 7.5* 7.3*  HCT 23.5* 23.3*  PLT 462* 554*    BMET  Recent Labs  07/30/13 0850 07/31/13 0405  NA 137 136  K 5.8* 4.5  CL 114* 108  CO2 11* 18*  GLUCOSE 168* 147*  BUN 52* 47*  CREATININE 2.34* 2.07*  CALCIUM 8.4 9.0    Micro Results: Recent Results (from the past 240  hour(s))  CULTURE, BLOOD (ROUTINE X 2)     Status: None   Collection Time    07/24/13 10:10 PM      Result Value Range Status   Specimen Description BLOOD RIGHT FOREARM   Final   Special Requests BOTTLES DRAWN AEROBIC AND ANAEROBIC 3CC   Final   Culture  Setup Time     Final   Value: 07/25/2013 06:28     Performed at [facility]   Culture     Final   Value: GROUP B STREP(S.AGALACTIAE)ISOLATED     Note: Gram Stain Report Called to,Read Back By and Verified With: SHANNON YOUNG ON 07/25/2013 AT 9:53P BY WILEJ     Performed at [facility]   Report Status 07/27/2013 FINAL   Final   Organism ID, Bacteria GROUP B STREP(S.AGALACTIAE)ISOLATED   Final  CULTURE, BLOOD (ROUTINE X 2)     Status: None   Collection Time    07/24/13 10:15 PM      Result Value Range Status   Specimen Description BLOOD RIGHT ANTECUBITAL   Final   Special Requests BOTTLES DRAWN AEROBIC AND ANAEROBIC 3CC   Final   Culture  Setup Time     Final   Value: 07/25/2013 06:27     Performed at [facility]   Culture     Final   Value: GROUP B STREP(S.AGALACTIAE)ISOLATED     Note: SUSCEPTIBILITIES PERFORMED ON PREVIOUS CULTURE WITHIN THE LAST 5 DAYS.     Note: Gram Stain Report Called to,Read Back By and Verified With: SHANNON YOUNG ON 07/25/2013 AT 9:53P BY WILEJ     Performed at [facility]   Report Status 07/27/2013 FINAL   Final  MRSA PCR SCREENING     Status: None   Collection Time    07/25/13  2:49 AM      Result Value Range Status   MRSA by PCR NEGATIVE  NEGATIVE Final   Comment:            The GeneXpert MRSA Assay (FDA     approved for NASAL specimens     only), is one component of a     comprehensive MRSA colonization     surveillance program. It is not     intended to diagnose MRSA     infection nor to guide or     monitor treatment for     MRSA infections.  WOUND CULTURE     Status: None   Collection Time    07/26/13 12:50 PM      Result Value Range Status   Specimen  Description FOOT   Final  Special Requests Normal   Final   Gram Stain     Final   Value: RARE WBC PRESENT, PREDOMINANTLY MONONUCLEAR     RARE SQUAMOUS EPITHELIAL CELLS PRESENT     NO ORGANISMS SEEN     Performed at [facility]   Culture     Final   Value: MULTIPLE ORGANISMS PRESENT, NONE PREDOMINANT NO STAPHYLOCOCCUS AUREUS ISOLATED NO GROUP A STREP (S.PYOGENES) ISOLATED     Performed at [facility]   Report Status 07/28/2013 FINAL   Final    Studies/Results: No results found.    Assessment/Plan: Adrian Khan is a 37 y.o. 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 Yes he has elevated uric acid but he also has Bacteremia so I have high anxiety about his left shoulder being infected IR was unable to perform aspiration of the joint.  With pain in the left arm, increasing edema concern rises for pyomyositis in arm in addition to septic shoulder  HE really needs MRI shoulder but not feasible per regs and notes documented in the chart, we are trying to get CT s contrast to evaluate  I really think it would be worthwhile consulting ORTHO shoulder from Dr Audrie Lia group to reconsider exploration of the shoulder joint and the arm. We have not been able to access the joint for fluid analysis. We have no proof that this is gout in the shoulder, nor do we have another explanation for his arm pain.   One could also consider giving steroids trial to see if improves shoulder pain but given biceps tenderness now present which is not c/w gout I would worry th at steroids  Could make this worse l  For now will continue rocephin for 6 weeks ( 6 weeks post op if he goes to OR ultimately)  --Regardless would treat for 6     #2 Septic shoulder vs gout flare:see above discussion need joint  fluid to distinguish gout vs infection  #3 AMputation site infection: Dr Lajoyce Corners following.     LOS: 7 days   Acey Lav 07/31/2013, 3:04 PM

## 2013-07-31 NOTE — Progress Notes (Signed)
Left upper extremity venous duplex:  No obvious evidence of DVT or superficial thrombosis.  Technically difficult study due to the patient's body habitus and immobility.  Left axillary, ulnar, and basilic veins not visualized.

## 2013-07-31 NOTE — Progress Notes (Signed)
Addendum: Pt was unable to be positioned adequately for CT shoulder due to weight He was then transferred to [facility] for this since greater weight limits on scanner there, when pt got there started complaining of pain and refused CT despite being offered to get more IV narcotics to control pain. I then called Dr.Duda to see if He could suggest a Shoulder/ORtho surgeon to evaluate pt, but Dr.Duda strongly felt that without objective evidence and imaging of Shoulder to delineate any pathology/anatomy couldn't not justify any operative intervention. He requested attempting Ct shoulder since MRI not possible. Will need to reattempt getting Ct shoulder tomorrow at [facility]. Bone scan was also suggested but Weight limit of 360lbs for scanner  Zannie Cove, MD [phone]

## 2013-07-31 NOTE — Progress Notes (Signed)
PT Cancellation Note  Patient Details Name: Adrian Khan MRN: [id number] DOB: September 02, 1976   Cancelled Treatment:    Reason Eval/Treat Not Completed: Pain limiting ability to participate--pt declines to participate due to pain. Possible test at [facility] today for shoulder???. Will check back tomorrow to see if pt will participate. If pt continues to refuse, will sign off. Thanks.    Rebeca Alert, MPT Pager: [phone]

## 2013-08-01 DIAGNOSIS — E119 Type 2 diabetes mellitus without complications: Secondary | ICD-10-CM

## 2013-08-01 LAB — BASIC METABOLIC PANEL
CO2: 18 mEq/L — ABNORMAL LOW (ref 19–32)
Chloride: 109 mEq/L (ref 96–112)
Glucose, Bld: 125 mg/dL — ABNORMAL HIGH (ref 70–99)
Potassium: 4.5 mEq/L (ref 3.5–5.1)
Sodium: 137 mEq/L (ref 135–145)

## 2013-08-01 LAB — CBC
Hemoglobin: 7.2 g/dL — ABNORMAL LOW (ref 13.0–17.0)
MCV: 85.4 fL (ref 78.0–100.0)
RBC: 2.74 MIL/uL — ABNORMAL LOW (ref 4.22–5.81)
RDW: 13.9 % (ref 11.5–15.5)
WBC: 19.5 10*3/uL — ABNORMAL HIGH (ref 4.0–10.5)

## 2013-08-01 LAB — GLUCOSE, CAPILLARY
Glucose-Capillary: 206 mg/dL — ABNORMAL HIGH (ref 70–99)
Glucose-Capillary: 95 mg/dL (ref 70–99)
Glucose-Capillary: 125 mg/dL — ABNORMAL HIGH (ref 70–99)
Glucose-Capillary: 125 mg/dL — ABNORMAL HIGH (ref 70–99)

## 2013-08-01 NOTE — Progress Notes (Signed)
[facility] PROGRESS NOTE  DAMASO LADAY MWU:[id number] DOB: 1976-05-30 DOA: 07/24/2013 PCP: Jeri Cos, MD Brief Narrative Mr.Whipple is a 36/M with PMH of Uncontrolled DM, Morbid Obesity, TMT amputation of L foot was admitted with Sepsis, Acute Renal Failure, L foot ulcer, Uncontrolled DM and L shoulder pain with limited movement of L arm and bedridden status x 4weeks. He was found to have GBS Bacteremia, is being followed by Ortho Dr.Duda and ID Dr.Van Dam. Care has been limited by decreased ability to image his possibly affected joint due to weight restrictions and patient compliance, see details below.  Assessment/Plan: 1. Group B strep Sepsis/Bacteremia/L foot ulcer -concern for bone involvement per Xray, unable to check MRI due to weight limitations  -Ortho Consult appreciated, no need for debridement at this time per Dr.Duda -has necrotic area on R foot metatarsal head too -Abx transitioned to Ceftriaxone 10/10, needs 6 weeks of Ceftriaxone per ID,  -PICC placed 10/13 -Appreciate ID assistance  2. Morbid obesity -biggest challenge to his Health, d/w Mother and pt  3. Severe Uncontrolled DM -improved -Hbaic 13.7 -continue current dose of lantus BID, SSI -Dm coordinator consult noted -insulin teaching for home  4. ARF on CKD -last baseline 1.3, from sepsis and NSAID use -hydrate, monitor, stopped Ibuprofen -I/Os not recorded accurately, strict I/Os, pt declines foley -Ct without evidence of hydronephrosis -slow improvement in creatinine and Urine output good, now down to 1.77 from 2.9, may continue improve further -continue IVF at 75cc/hr, follow and recheck  5. L shoulder pain and tenderness -had Xray 10/7 with Potential AC joint widening with suspected os acromiale or acromial fracture /Gout also possibility -I d/w and consulted IR  for L shoulder aspiration, unfortunately Dr.Yamagata reviewed Recent Ct and Films and felt too difficult to reach L  shoulder joint ?18cm deep from surface and yield would be very low due to low likelihood of reaching joint. -Called [facility] for Open MRI, they are unable to scan inpatients -had a CT shoulder 10/1 in [place] [facility] prior to this admission without definite abnormality, but limited study due to body habitus, unable to check MRI scan here -CT of left shoulder attempted at [facility] on 10/14 but unsuccessful-and patient states he will not go back to try it again as he simply can't fit in the machine and that he tried all that he could and was asked to do to make it work. -Discussed with patient and mother and the the only other option at this time will be for patient to be discharged home>> to outpatient for open MRI and then follow up with orthopedics once the MRI is done -Mother is agreeable with this but wants to get ramp at home set up today he for possible discharge tomorrow, and also wants all home health services  6. Hyperkalemia -Resolved  DVT proph: Hep SQ  Code Status: Full Family Communication: d/w mother at bedside Disposition Plan: declines Rehab, despite multiple discussions regarding this, will need HH services  Consultants:  ORtho Dr.Duda  Antibiotics:  VAnc/Zosyn  HPI/Subjective: C/o pain in his back, L shoulder pain still present but improving  Objective: Filed Vitals:   08/01/13 0632  BP: 155/71  Pulse: 91  Temp: 98.4 F (36.9 C)  Resp: 20    Intake/Output Summary (Last 24 hours) at 08/01/13 1229 Last data filed at 08/01/13 0909  Gross per 24 hour  Intake      0 ml  Output   1575 ml  Net  -1575 ml  Filed Weights   07/25/13 0551 07/25/13 1100  Weight: 187 kg (412 lb 4.2 oz) 185.3 kg (408 lb 8.2 oz)    Exam:   General: AAOx3, morbidly obese  Cardiovascular: S1S2/RRR  Respiratory: distant breath sounds  Abdomen: soft, obese, with multiple folds  Musculoskeletal: 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, R foot base of 5th MTJ with  necrotic toe   L shoulder with point tenderness medially and laterally, improved  Sacrum with skin breakdown, multiple small pressure ulcers  Data Reviewed: Basic Metabolic Panel:  Recent Labs Lab 07/28/13 0630 07/29/13 0550 07/30/13 0850 07/31/13 0405 08/01/13 0440  NA 132* 134* 137 136 137  K 4.7 4.9 5.8* 4.5 4.5  CL 103 107 114* 108 109  CO2 16* 16* 11* 18* 18*  GLUCOSE 118* 69* 168* 147* 125*  BUN 61* 53* 52* 47* 40*  CREATININE 2.43* 2.30* 2.34* 2.07* 1.77*  CALCIUM 7.9* 8.0* 8.4 9.0 9.1   Liver Function Tests: No results found for this basename: AST, ALT, ALKPHOS, BILITOT, PROT, ALBUMIN,  in the last 168 hours No results found for this basename: LIPASE, AMYLASE,  in the last 168 hours No results found for this basename: AMMONIA,  in the last 168 hours CBC:  Recent Labs Lab 07/28/13 0630 07/29/13 0550 07/30/13 0500 07/31/13 0405 08/01/13 0440  WBC 27.1* 23.4* 21.8* 19.9* 19.5*  HGB 8.5* 7.9* 7.5* 7.3* 7.2*  HCT 26.8* 24.2* 23.5* 23.3* 23.4*  MCV 84.5 82.9 83.9 85.3 85.4  PLT 422* 410* 462* 554* 575*   Cardiac Enzymes: No results found for this basename: CKTOTAL, CKMB, CKMBINDEX, TROPONINI,  in the last 168 hours BNP (last 3 results) No results found for this basename: PROBNP,  in the last 8760 hours CBG:  Recent Labs Lab 07/31/13 1154 07/31/13 1714 07/31/13 2210 08/01/13 0736 08/01/13 1128  GLUCAP 107* 144* 206* 95 125*    Recent Results (from the past 240 hour(s))  CULTURE, BLOOD (ROUTINE X 2)     Status: None   Collection Time    07/24/13 10:10 PM      Result Value Range Status   Specimen Description BLOOD RIGHT FOREARM   Final   Special Requests BOTTLES DRAWN AEROBIC AND ANAEROBIC 3CC   Final   Culture  Setup Time     Final   Value: 07/25/2013 06:28     Performed at [facility]   Culture     Final   Value: GROUP B STREP(S.AGALACTIAE)ISOLATED     Note: Gram Stain Report Called to,Read Back By and Verified With: SHANNON YOUNG ON  07/25/2013 AT 9:53P BY WILEJ     Performed at [facility]   Report Status 07/27/2013 FINAL   Final   Organism ID, Bacteria GROUP B STREP(S.AGALACTIAE)ISOLATED   Final  CULTURE, BLOOD (ROUTINE X 2)     Status: None   Collection Time    07/24/13 10:15 PM      Result Value Range Status   Specimen Description BLOOD RIGHT ANTECUBITAL   Final   Special Requests BOTTLES DRAWN AEROBIC AND ANAEROBIC 3CC   Final   Culture  Setup Time     Final   Value: 07/25/2013 06:27     Performed at [facility]   Culture     Final   Value: GROUP B STREP(S.AGALACTIAE)ISOLATED     Note: SUSCEPTIBILITIES PERFORMED ON PREVIOUS CULTURE WITHIN THE LAST 5 DAYS.     Note: Gram Stain Report Called to,Read Back By and  Verified With: SHANNON YOUNG ON 07/25/2013 AT 9:53P BY Serafina Mitchell     Performed at [facility]   Report Status 07/27/2013 FINAL   Final  MRSA PCR SCREENING     Status: None   Collection Time    07/25/13  2:49 AM      Result Value Range Status   MRSA by PCR NEGATIVE  NEGATIVE Final   Comment:            The GeneXpert MRSA Assay (FDA     approved for NASAL specimens     only), is one component of a     comprehensive MRSA colonization     surveillance program. It is not     intended to diagnose MRSA     infection nor to guide or     monitor treatment for     MRSA infections.  WOUND CULTURE     Status: None   Collection Time    07/26/13 12:50 PM      Result Value Range Status   Specimen Description FOOT   Final   Special Requests Normal   Final   Gram Stain     Final   Value: RARE WBC PRESENT, PREDOMINANTLY MONONUCLEAR     RARE SQUAMOUS EPITHELIAL CELLS PRESENT     NO ORGANISMS SEEN     Performed at [facility]   Culture     Final   Value: MULTIPLE ORGANISMS PRESENT, NONE PREDOMINANT NO STAPHYLOCOCCUS AUREUS ISOLATED NO GROUP A STREP (S.PYOGENES) ISOLATED     Performed at [facility]   Report Status 07/28/2013 FINAL   Final     Studies: No results  found.  Scheduled Meds: . cefTRIAXone (ROCEPHIN)  IV  2 g Intravenous Q24H  . insulin aspart  0-20 Units Subcutaneous TID WC  . insulin aspart  0-5 Units Subcutaneous QHS  . insulin glargine  22 Units Subcutaneous BID  . living well with diabetes book   Does not apply Once  .  morphine injection  6 mg Intravenous NOW  . nystatin cream   Topical BID  . OxyCODONE  15 mg Oral Q12H  . silver sulfADIAZINE   Topical Daily   Continuous Infusions: . sodium chloride 50 mL/hr at 07/31/13 1858    Principal Problem:   Sepsis Active Problems:   DIABETES, TYPE 2   Morbid obesity, BMI unknown   Diabetic foot ulcer   ARF (acute renal failure)    Time spent:    Kela Millin  [facility] Pager [phone]. If 7PM-7AM, please contact night-coverage at [email or web address], password [facility] 08/01/2013, 12:29 PM  LOS: 8 days

## 2013-08-01 NOTE — Progress Notes (Signed)
[facility]     Day # 7 rocephin   Subjective: Still c/o shoulder pain, arm pain, problems with positioning in CT scanner   Antibiotics:  Anti-infectives   Start     Dose/Rate Route Frequency Ordered Stop   07/26/13 1500  cefTRIAXone (ROCEPHIN) 2 g in dextrose 5 % 50 mL IVPB     2 g 100 mL/hr over 30 Minutes Intravenous Every 24 hours 07/26/13 1448     07/26/13 1300  ceFAZolin (ANCEF) IVPB 2 g/50 mL premix  Status:  Discontinued     2 g 100 mL/hr over 30 Minutes Intravenous Every 12 hours 07/26/13 1201 07/26/13 1448   07/25/13 1200  vancomycin (VANCOCIN) 2,000 mg in sodium chloride 0.9 % 500 mL IVPB  Status:  Discontinued     2,000 mg 250 mL/hr over 120 Minutes Intravenous Every 24 hours 07/25/13 0954 07/26/13 1448   07/25/13 1000  vancomycin (VANCOCIN) 1,500 mg in sodium chloride 0.9 % 500 mL IVPB  Status:  Discontinued     1,500 mg 250 mL/hr over 120 Minutes Intravenous Every 12 hours 07/25/13 0553 07/25/13 0952   07/25/13 1000  vancomycin (VANCOCIN) 2,000 mg in sodium chloride 0.9 % 500 mL IVPB  Status:  Discontinued     2,000 mg 250 mL/hr over 120 Minutes Intravenous Every 12 hours 07/25/13 0952 07/25/13 0954   07/25/13 0600  piperacillin-tazobactam (ZOSYN) IVPB 3.375 g  Status:  Discontinued     3.375 g 12.5 mL/hr over 240 Minutes Intravenous 3 times per day 07/25/13 0553 07/26/13 1101   07/24/13 2230  piperacillin-tazobactam (ZOSYN) IVPB 3.375 g     3.375 g 100 mL/hr over 30 Minutes Intravenous  Once 07/24/13 2226 07/25/13 0100   07/24/13 2230  vancomycin (VANCOCIN) 1,500 mg in sodium chloride 0.9 % 500 mL IVPB     1,500 mg 250 mL/hr over 120 Minutes Intravenous  Once 07/24/13 2226 07/25/13 0300      Medications: Scheduled Meds: . cefTRIAXone (ROCEPHIN)  IV  2 g Intravenous Q24H  . insulin aspart  0-20 Units Subcutaneous TID WC  . insulin aspart  0-5 Units Subcutaneous QHS  . insulin glargine  22 Units Subcutaneous BID  . living  well with diabetes book   Does not apply Once  . nystatin cream   Topical BID  . OxyCODONE  15 mg Oral Q12H  . silver sulfADIAZINE   Topical Daily   Continuous Infusions: . sodium chloride 50 mL/hr at 08/01/13 1638   PRN Meds:.acetaminophen, morphine injection, ondansetron (ZOFRAN) IV, ondansetron, oxyCODONE-acetaminophen, polyethylene glycol, senna-docusate, sodium chloride   Objective: Weight change:   Intake/Output Summary (Last 24 hours) at 08/01/13 1645 Last data filed at 08/01/13 0909  Gross per 24 hour  Intake      0 ml  Output   1375 ml  Net  -1375 ml   Blood pressure 151/85, pulse 97, temperature 98.2 F (36.8 C), temperature source Oral, resp. rate 18, height 5\' 11"  (1.803 m), weight 408 lb 8.2 oz (185.3 kg), SpO2 99.00%. Temp:  [98.2 F (36.8 C)-98.4 F (36.9 C)] 98.2 F (36.8 C) (10/15 1409) Pulse Rate:  [91-97] 97 (10/15 1409) Resp:  [18-20] 18 (10/15 1409) BP: (151-155)/(69-85) 151/85 mmHg (10/15 1409) SpO2:  [99 %-100 %] 99 % (10/15 1409)  Physical Exam: General: Alert and awake, oriented x3, not in any acute distress.  HEENT: anicteric sclera, pupils reactive to light and accommodation, EOMI, oropharynx clear and without exudate  CVS regular rate, normal r, no murmur rubs or gallops  Chest: clear to auscultation bilaterally, no wheezing, rales or rhonchi  Abdomen: soft nontender, nondistended, normal bowel sounds,  Extremities: left shoulder is tender to palpationnot warm, biceps muscle is tender Feet dressed Skin: no rashes  Neuro: nonfocal, strength and sensation intact   Lab Results:  Recent Labs  07/31/13 0405 08/01/13 0440  WBC 19.9* 19.5*  HGB 7.3* 7.2*  HCT 23.3* 23.4*  PLT 554* 575*    BMET  Recent Labs  07/31/13 0405 08/01/13 0440  NA 136 137  K 4.5 4.5  CL 108 109  CO2 18* 18*  GLUCOSE 147* 125*  BUN 47* 40*  CREATININE 2.07* 1.77*  CALCIUM 9.0 9.1    Micro Results: Recent Results (from the past 240 hour(s))  CULTURE,  BLOOD (ROUTINE X 2)     Status: None   Collection Time    07/24/13 10:10 PM      Result Value Range Status   Specimen Description BLOOD RIGHT FOREARM   Final   Special Requests BOTTLES DRAWN AEROBIC AND ANAEROBIC 3CC   Final   Culture  Setup Time     Final   Value: 07/25/2013 06:28     Performed at [facility]   Culture     Final   Value: GROUP B STREP(S.AGALACTIAE)ISOLATED     Note: Gram Stain Report Called to,Read Back By and Verified With: SHANNON YOUNG ON 07/25/2013 AT 9:53P BY WILEJ     Performed at [facility]   Report Status 07/27/2013 FINAL   Final   Organism ID, Bacteria GROUP B STREP(S.AGALACTIAE)ISOLATED   Final  CULTURE, BLOOD (ROUTINE X 2)     Status: None   Collection Time    07/24/13 10:15 PM      Result Value Range Status   Specimen Description BLOOD RIGHT ANTECUBITAL   Final   Special Requests BOTTLES DRAWN AEROBIC AND ANAEROBIC 3CC   Final   Culture  Setup Time     Final   Value: 07/25/2013 06:27     Performed at [facility]   Culture     Final   Value: GROUP B STREP(S.AGALACTIAE)ISOLATED     Note: SUSCEPTIBILITIES PERFORMED ON PREVIOUS CULTURE WITHIN THE LAST 5 DAYS.     Note: Gram Stain Report Called to,Read Back By and Verified With: SHANNON YOUNG ON 07/25/2013 AT 9:53P BY WILEJ     Performed at [facility]   Report Status 07/27/2013 FINAL   Final  MRSA PCR SCREENING     Status: None   Collection Time    07/25/13  2:49 AM      Result Value Range Status   MRSA by PCR NEGATIVE  NEGATIVE Final   Comment:            The GeneXpert MRSA Assay (FDA     approved for NASAL specimens     only), is one component of a     comprehensive MRSA colonization     surveillance program. It is not     intended to diagnose MRSA     infection nor to guide or     monitor treatment for     MRSA infections.  WOUND CULTURE     Status: None   Collection Time    07/26/13 12:50 PM      Result Value Range Status   Specimen Description FOOT    Final   Special Requests Normal   Final  Gram Stain     Final   Value: RARE WBC PRESENT, PREDOMINANTLY MONONUCLEAR     RARE SQUAMOUS EPITHELIAL CELLS PRESENT     NO ORGANISMS SEEN     Performed at [facility]   Culture     Final   Value: MULTIPLE ORGANISMS PRESENT, NONE PREDOMINANT NO STAPHYLOCOCCUS AUREUS ISOLATED NO GROUP A STREP (S.PYOGENES) ISOLATED     Performed at [facility]   Report Status 07/28/2013 FINAL   Final    Studies/Results: No results found.    Assessment/Plan: Adrian Khan is a 37 y.o. 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 Yes he has elevated uric acid but he also has Bacteremia so I have high anxiety about his left shoulder being infected IR was unable to perform aspiration of the joint.  With pain in the left arm, increasing edema concern rises for pyomyositis in arm in addition to septic shoulder  HE really needs MRI shoulder which we will be forced to obtain as an outpatient. CT ? To be attempted again today  We have not been able to access the joint for fluid analysis. We have no proof that this is gout in the shoulder, nor do we have another explanation for his arm pain. As I have mentioned before reconsideration by Orthopedic Shoulder surgery would be prudent.   One could also consider giving steroids trial to see if improves shoulder pain but given biceps tenderness now present which is not c/w gout I would worry th at steroids  Could make this worse l  For now will continue rocephin for 6 weeks ( 6 weeks post op if he goes to OR ultimately)  --Regardless would treat for 6    #2 Septic shoulder vs gout flare:see above discussion need joint fluid to distinguish gout vs infection  #3 AMputation site infection: Dr Lajoyce Corners  following.     LOS: 8 days   Acey Lav 08/01/2013, 4:45 PM

## 2013-08-01 NOTE — Progress Notes (Signed)
Physical Therapy Discharge Patient Details Name: Adrian Khan MRN: [id number] DOB: January 19, 1976 Today's Date: 08/01/2013 Time:  -     Patient discharged from PT services secondary to patient has refused 3 (three) consecutive times without medical reason.   Please see latest therapy progress note for current level of functioning and progress toward goals.    Progress and discharge plan discussed with patient and/or caregiver: Patient/Caregiver agrees with plan  Please re-order if/when pt agreeable to participating in PT.   GP     [name] 08/01/2013, 10:29 AM  [phone]

## 2013-08-02 ENCOUNTER — Other Ambulatory Visit: Payer: Self-pay | Admitting: Internal Medicine

## 2013-08-02 ENCOUNTER — Other Ambulatory Visit: Payer: Self-pay

## 2013-08-02 DIAGNOSIS — B999 Unspecified infectious disease: Secondary | ICD-10-CM

## 2013-08-02 DIAGNOSIS — M109 Gout, unspecified: Secondary | ICD-10-CM

## 2013-08-02 DIAGNOSIS — R079 Chest pain, unspecified: Secondary | ICD-10-CM

## 2013-08-02 LAB — CBC
HCT: 23.4 % — ABNORMAL LOW (ref 39.0–52.0)
MCH: 26.2 pg (ref 26.0–34.0)
MCV: 86.3 fL (ref 78.0–100.0)
Platelets: 599 10*3/uL — ABNORMAL HIGH (ref 150–400)
RBC: 2.71 MIL/uL — ABNORMAL LOW (ref 4.22–5.81)
RDW: 13.9 % (ref 11.5–15.5)

## 2013-08-02 LAB — GLUCOSE, CAPILLARY
Glucose-Capillary: 83 mg/dL (ref 70–99)
Glucose-Capillary: 115 mg/dL — ABNORMAL HIGH (ref 70–99)

## 2013-08-02 LAB — BASIC METABOLIC PANEL
BUN: 34 mg/dL — ABNORMAL HIGH (ref 6–23)
CO2: 19 mEq/L (ref 19–32)
Calcium: 8.6 mg/dL (ref 8.4–10.5)
Creatinine, Ser: 1.87 mg/dL — ABNORMAL HIGH (ref 0.50–1.35)
GFR calc Af Amer: 53 mL/min — ABNORMAL LOW (ref >90)

## 2013-08-02 MED ORDER — INSULIN GLARGINE 100 UNIT/ML ~~LOC~~ SOLN: 22.0000 [IU] | Freq: Two times a day (BID) | SUBCUTANEOUS | Status: DC

## 2013-08-02 MED ORDER — SENNOSIDES-DOCUSATE SODIUM 8.6-50 MG PO TABS: 1.0000 | ORAL_TABLET | Freq: Two times a day (BID) | ORAL | Status: DC | PRN

## 2013-08-02 MED ORDER — INSULIN GLARGINE 100 UNITS/ML SOLOSTAR PEN: 22.0000 [IU] | PEN_INJECTOR | Freq: Two times a day (BID) | SUBCUTANEOUS | Status: DC

## 2013-08-02 MED ORDER — DEXTROSE 5 % IV SOLN: 2.0000 g | INTRAVENOUS | Status: AC

## 2013-08-02 MED ORDER — OXYCODONE HCL ER 15 MG PO T12A: 15.0000 mg | EXTENDED_RELEASE_TABLET | Freq: Two times a day (BID) | ORAL | Status: DC

## 2013-08-02 MED ORDER — OXYCODONE-ACETAMINOPHEN 5-325 MG PO TABS: 2.0000 | ORAL_TABLET | ORAL | Status: DC | PRN

## 2013-08-02 MED ORDER — SILVER SULFADIAZINE 1 % EX CREA: TOPICAL_CREAM | Freq: Every day | CUTANEOUS | Status: DC

## 2013-08-02 NOTE — Progress Notes (Signed)
[facility]     Day # 8 rocephin   Subjective: Still c/o shoulder pain, arm pain, now chest pain last night and  This am left sided without radiation made worse by sitting up and better by lying down. Not associate by nausea or diaphoresis not pleuritic   Antibiotics:  Anti-infectives   Start     Dose/Rate Route Frequency Ordered Stop   07/26/13 1500  cefTRIAXone (ROCEPHIN) 2 g in dextrose 5 % 50 mL IVPB     2 g 100 mL/hr over 30 Minutes Intravenous Every 24 hours 07/26/13 1448     07/26/13 1300  ceFAZolin (ANCEF) IVPB 2 g/50 mL premix  Status:  Discontinued     2 g 100 mL/hr over 30 Minutes Intravenous Every 12 hours 07/26/13 1201 07/26/13 1448   07/25/13 1200  vancomycin (VANCOCIN) 2,000 mg in sodium chloride 0.9 % 500 mL IVPB  Status:  Discontinued     2,000 mg 250 mL/hr over 120 Minutes Intravenous Every 24 hours 07/25/13 0954 07/26/13 1448   07/25/13 1000  vancomycin (VANCOCIN) 1,500 mg in sodium chloride 0.9 % 500 mL IVPB  Status:  Discontinued     1,500 mg 250 mL/hr over 120 Minutes Intravenous Every 12 hours 07/25/13 0553 07/25/13 0952   07/25/13 1000  vancomycin (VANCOCIN) 2,000 mg in sodium chloride 0.9 % 500 mL IVPB  Status:  Discontinued     2,000 mg 250 mL/hr over 120 Minutes Intravenous Every 12 hours 07/25/13 0952 07/25/13 0954   07/25/13 0600  piperacillin-tazobactam (ZOSYN) IVPB 3.375 g  Status:  Discontinued     3.375 g 12.5 mL/hr over 240 Minutes Intravenous 3 times per day 07/25/13 0553 07/26/13 1101   07/24/13 2230  piperacillin-tazobactam (ZOSYN) IVPB 3.375 g     3.375 g 100 mL/hr over 30 Minutes Intravenous  Once 07/24/13 2226 07/25/13 0100   07/24/13 2230  vancomycin (VANCOCIN) 1,500 mg in sodium chloride 0.9 % 500 mL IVPB     1,500 mg 250 mL/hr over 120 Minutes Intravenous  Once 07/24/13 2226 07/25/13 0300      Medications: Scheduled Meds: . cefTRIAXone (ROCEPHIN)  IV  2 g Intravenous Q24H  . insulin aspart  0-20  Units Subcutaneous TID WC  . insulin aspart  0-5 Units Subcutaneous QHS  . insulin glargine  22 Units Subcutaneous BID  . living well with diabetes book   Does not apply Once  . nystatin cream   Topical BID  . OxyCODONE  15 mg Oral Q12H  . silver sulfADIAZINE   Topical Daily   Continuous Infusions: . sodium chloride 50 mL/hr at 08/01/13 1638   PRN Meds:.acetaminophen, morphine injection, ondansetron (ZOFRAN) IV, ondansetron, oxyCODONE-acetaminophen, polyethylene glycol, senna-docusate, sodium chloride   Objective: Weight change:   Intake/Output Summary (Last 24 hours) at 08/02/13 1243 Last data filed at 08/02/13 1200  Gross per 24 hour  Intake 2039.17 ml  Output   2060 ml  Net -20.83 ml   Blood pressure 164/77, pulse 114, temperature 98.5 F (36.9 C), temperature source Oral, resp. rate 18, height 5\' 11"  (1.803 m), weight 408 lb 8.2 oz (185.3 kg), SpO2 99.00%. Temp:  [98.2 F (36.8 C)-98.8 F (37.1 C)] 98.5 F (36.9 C) (10/16 0900) Pulse Rate:  [97-114] 114 (10/16 0900) Resp:  [18-20] 18 (10/16 0900) BP: (151-178)/(62-85) 164/77 mmHg (10/16 0900) SpO2:  [99 %-100 %] 99 % (10/16 0900)  Physical Exam: General: Alert and awake, oriented x3, not in any  acute distress.  HEENT: anicteric sclera, pupils reactive to light and accommodation, EOMI, oropharynx clear and without exudate  CVS regular rate, normal r, no murmur rubs or gallops  Chest: clear to auscultation bilaterally, no wheezing, rales or rhonchi  Abdomen: soft nontender, nondistended, normal bowel sounds,  Extremities: left shoulder is tender to palpation not esp  warm, biceps muscle is tender Feet dressed Skin: no rashes  Neuro: nonfocal, strength and sensation intact   Lab Results:  Recent Labs  08/01/13 0440 08/02/13 0520  WBC 19.5* 18.2*  HGB 7.2* 7.1*  HCT 23.4* 23.4*  PLT 575* 599*    BMET  Recent Labs  08/01/13 0440 08/02/13 0520  NA 137 139  K 4.5 4.4  CL 109 112  CO2 18* 19  GLUCOSE  125* 99  BUN 40* 34*  CREATININE 1.77* 1.87*  CALCIUM 9.1 8.6    Micro Results: Recent Results (from the past 240 hour(s))  CULTURE, BLOOD (ROUTINE X 2)     Status: None   Collection Time    07/24/13 10:10 PM      Result Value Range Status   Specimen Description BLOOD RIGHT FOREARM   Final   Special Requests BOTTLES DRAWN AEROBIC AND ANAEROBIC 3CC   Final   Culture  Setup Time     Final   Value: 07/25/2013 06:28     Performed at [facility]   Culture     Final   Value: GROUP B STREP(S.AGALACTIAE)ISOLATED     Note: Gram Stain Report Called to,Read Back By and Verified With: SHANNON YOUNG ON 07/25/2013 AT 9:53P BY WILEJ     Performed at [facility]   Report Status 07/27/2013 FINAL   Final   Organism ID, Bacteria GROUP B STREP(S.AGALACTIAE)ISOLATED   Final  CULTURE, BLOOD (ROUTINE X 2)     Status: None   Collection Time    07/24/13 10:15 PM      Result Value Range Status   Specimen Description BLOOD RIGHT ANTECUBITAL   Final   Special Requests BOTTLES DRAWN AEROBIC AND ANAEROBIC 3CC   Final   Culture  Setup Time     Final   Value: 07/25/2013 06:27     Performed at [facility]   Culture     Final   Value: GROUP B STREP(S.AGALACTIAE)ISOLATED     Note: SUSCEPTIBILITIES PERFORMED ON PREVIOUS CULTURE WITHIN THE LAST 5 DAYS.     Note: Gram Stain Report Called to,Read Back By and Verified With: SHANNON YOUNG ON 07/25/2013 AT 9:53P BY WILEJ     Performed at [facility]   Report Status 07/27/2013 FINAL   Final  MRSA PCR SCREENING     Status: None   Collection Time    07/25/13  2:49 AM      Result Value Range Status   MRSA by PCR NEGATIVE  NEGATIVE Final   Comment:            The GeneXpert MRSA Assay (FDA     approved for NASAL specimens     only), is one component of a     comprehensive MRSA colonization     surveillance program. It is not     intended to diagnose MRSA     infection nor to guide or     monitor treatment for     MRSA  infections.  WOUND CULTURE     Status: None   Collection Time    07/26/13 12:50 PM  Result Value Range Status   Specimen Description FOOT   Final   Special Requests Normal   Final   Gram Stain     Final   Value: RARE WBC PRESENT, PREDOMINANTLY MONONUCLEAR     RARE SQUAMOUS EPITHELIAL CELLS PRESENT     NO ORGANISMS SEEN     Performed at [facility]   Culture     Final   Value: MULTIPLE ORGANISMS PRESENT, NONE PREDOMINANT NO STAPHYLOCOCCUS AUREUS ISOLATED NO GROUP A STREP (S.PYOGENES) ISOLATED     Performed at [facility]   Report Status 07/28/2013 FINAL   Final    Studies/Results: No results found.    Assessment/Plan: Adrian Khan is a 37 y.o. 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 Yes he has elevated uric acid but he also has Bacteremia so I have high anxiety about his left shoulder being infected IR was unable to perform aspiration of the joint.  With pain in the left arm, increasing edema concern rises for pyomyositis in arm in addition to septic shoulder  HE really needs MRI shoulder which we will be forced to obtain as an outpatient. CT ? To be attempted again today  We have not been able to access the joint for fluid analysis. We have no proof that this is gout in the shoulder, nor do we have another explanation for his arm pain. As I have mentioned before reconsideration by Orthopedic Shoulder surgery would be prudent.   One could also consider giving steroids trial to see if improves shoulder pain but given biceps tenderness now present which is not c/w gout I would worry th at steroids  Could make this worse l  For now will continue rocephin for 6 weeks ( 6 weeks post op if he goes to OR ultimately)  --Regardless would treat for 6    #2 Septic  shoulder vs gout flare:see above discussion need joint fluid to distinguish gout vs infection  #3 AMputation site infection: Dr Lajoyce Corners following.  #4 Chest pain: discussed with primary and primary and pt believe likely referred pain from shoulder    LOS: 9 days   Acey Lav 08/02/2013, 12:43 PM

## 2013-08-02 NOTE — Progress Notes (Signed)
Patient complaining of chest pain.  EKG performed showing Sinus Tachycardia.  Dr. Suanne Marker, notified with order for Troponin level.  Philomena Doheny RN

## 2013-08-02 NOTE — Discharge Summary (Signed)
Physician Discharge Summary  Adrian Khan ZOX:[id number] DOB: 1975-12-06 DOA: 07/24/2013  PCP: Jeri Cos, MD  Admit date: 07/24/2013 Discharge date: 08/02/2013  Time spent: >26minutes  Recommendations for Outpatient Follow-up:  Follow-up Information   Follow up with DUDA,MARCUS V, MD Today. (call for appt to be  up for 2-3days after MRI has been done)    Specialty:  Orthopedic Surgery   Contact information:   [address] [place] [address] [phone]       Please follow up. (PCP in 1-2weeks, call for appt upon discharge)        Discharge Diagnoses:  Principal Problem:   Sepsis Active Problems:   DIABETES, TYPE 2   Morbid obesity, BMI unknown   Diabetic foot ulcer   ARF (acute renal failure)   Discharge Condition: improved/stable  Diet recommendation: modified carb  Filed Weights   07/25/13 0551 07/25/13 1100  Weight: 187 kg (412 lb 4.2 oz) 185.3 kg (408 lb 8.2 oz)    History of present illness:  With a hx of diabetes and morbid obesity who is s/p a remote hx of partial L foot amputation who initially presented to the ED with complaints of generalized pain, mainly in the shoulders and back. During his workup, the patient was found to have a WBC of well over 30K with findings of an infected L foot. The patient was also found to be tachycardic. An xray of the foot demonstrated the ulcer which extended to the level of the bone. Osteo could not be ruled out by plain xray. The pt was started on vanc and zosyn and the hospitalist was consulted for admission.   Hospital Course:  1.Group B strep Sepsis/Bacteremia/L foot ulcer -As discussed above upon admission plain x-ray was done and there was concern for bone involvement per Xray, unable to check MRI due to weight limitations  -Ortho was consulted and and saw pt and indicated, no need for debridement at this time per Dr.Duda  -he was noted to have necrotic area on R foot metatarsal head too>> no  surgical intervention was recommended per ortho -He was placed on empiric abx with Vanc and Zosyn after blood and wound cultures were obtained. The blood cultures grew Group B strep, and wound cultures grew multiple organisms-NO staph aureus isolated.   -ID was consulted and they followed pt and assisted with Abx recommendations>> Dr Daiva Eves followed and transitioned abx to Ceftriaxone 10/10, and recommended 6 weeks of Ceftriaxone, PICC placed 10/13. HH was set up to assist with outpt abx on discharge and he is to follow up with outpt physicians.  2. Morbid obesity  -biggest challenge to his Health, Dr Jomarie Longs d/w Mother and pt  3. Severe Uncontrolled DM  -Hbaic 11.5 on 12/6 -and so pt was switched to lantus- BID, SSI during this hospital stay for better control.  -Dm coordinator was consulted and assisted with DM teaching.  -insulin teaching for home was done in the hospital, pt to follow up outpt with PCP. 4. ARF on CKD  -last baseline 1.3, from sepsis and NSAID use  -hydrate, monitor, stopped Ibuprofen  -I/Os not recorded accurately, pt declined foley  -Ct without evidence of hydronephrosis  -slow improvement in creatinine and Urine output good,  to 1.87 on discharge from 2.9 on admission  5. L shoulder pain and tenderness  -had Xray 10/7 with Potential AC joint widening with suspected os acromiale or acromial fracture /Gout also possibility  -I d/w and consulted IR for L  shoulder aspiration, unfortunately Dr.Yamagata reviewed Recent Ct and Films and felt too difficult to reach L shoulder joint ?18cm deep from surface and yield would be very low due to low likelihood of reaching joint.  -Called [facility] for Open MRI, they are unable to scan inpatients  -had a CT shoulder 10/1 in [facility] prior to this admission without definite abnormality, but limited study due to body habitus, unable to check MRI scan here  -CT of left shoulder attempted [facility] on 10/14 but unsuccessful-and  patient stated he will not go back to try it again as he simply can't fit in the machine and that he tried all that he could and was asked to do to make it work.  -Discussed with patient and mother and the the only other option at this time will be for patient to be discharged home>> to outpatient for open MRI and then follow up with orthopedics once the MRI is done. This was discussed with Dr Lajoyce Corners & Dr Zenaida [facility] and they agreed and Dr Lajoyce Corners recommended for pt to make appt to see him in office 2-3days after  MRI was done. -Mother is agreeable, time was given for her get ramp at home set up as requested, home health services were set up. It was noted that pt per dicussion with CM that pt had declined SNF He was dc'ed to follow up outpt with PCP, ID and ORTHO 6. Hyperkalemia  -Resolved, k was 4.4 prior to dc.   Procedures:  none  Consultations:  ID  ortho  Discharge Exam: Filed Vitals:   08/02/13 1337  BP: 134/54  Pulse: 102  Temp: 98.2 F (36.8 C)  Resp: 20    Exam:  General: AAOx3, morbidly obese  Cardiovascular: S1S2/RRR  Respiratory: distant breath sounds  Abdomen: soft, obese, with multiple folds  Musculoskeletal: 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, R foot base of 5th MTJ with necrotic toe  L shoulder with point tenderness medially and laterally, improved  Sacrum with skin breakdown, multiple small pressure ulcers   Discharge Instructions  Discharge Orders   Future Orders Complete By Expires   Diet Carb Modified  As directed    Increase activity slowly  As directed        Medication List    STOP taking these medications       ibuprofen 200 MG tablet  Commonly known as:  ADVIL,MOTRIN     sitaGLIPtin 100 MG tablet  Commonly known as:  JANUVIA      TAKE these medications       dextrose 5 % SOLN 50 mL with cefTRIAXone 2 G SOLR 2 g  Inject 2 g into the vein daily.     insulin glargine 100 UNIT/ML injection  Commonly known as:  LANTUS  Inject  0.22 mLs (22 Units total) into the skin 2 (two) times daily.     OxyCODONE 15 mg T12a 12 hr tablet  Commonly known as:  OXYCONTIN  Take 1 tablet (15 mg total) by mouth every 12 (twelve) hours.     oxyCODONE-acetaminophen 5-325 MG per tablet  Commonly known as:  PERCOCET  Take 2 tablets by mouth every 4 (four) hours as needed for pain.     senna-docusate 8.6-50 MG per tablet  Commonly known as:  Senokot-S  Take 1 tablet by mouth 2 (two) times daily as needed.     silver sulfADIAZINE 1 % cream  Commonly known as:  SILVADENE  Apply topically  daily.       Allergies  Allergen Reactions  . Sulfonamide Derivatives Other (See Comments)    cramps       Follow-up Information   Follow up with DUDA,MARCUS V, MD In 2 weeks.   Specialty:  Orthopedic Surgery   Contact information:   [address] [place] [address] [phone]       Please follow up. (PCP in 1-2weeks, call for appt upon discharge)        The results of significant diagnostics from this hospitalization (including imaging, microbiology, ancillary and laboratory) are listed below for reference.    Significant Diagnostic Studies: Ct Abdomen Pelvis Wo Contrast  07/25/2013   *RADIOLOGY REPORT*  Clinical Data: Back pain and shoulder pain.  CT ABDOMEN AND PELVIS WITHOUT CONTRAST  Technique:  Multidetector CT imaging of the abdomen and pelvis was performed following the standard protocol without intravenous contrast.  Comparison: Lumbar spine radiographs performed earlier today at 08:43 p.m.  Findings: Minimal bibasilar atelectasis is noted; the lung bases are not well assessed due to motion artifact.  The liver and spleen are unremarkable in appearance.  There is vicarious excretion of contrast within the gallbladder.  The gallbladder is unremarkable in appearance.  The pancreas and adrenal glands are unremarkable.  The kidneys are unremarkable in appearance.  There is no evidence of hydronephrosis.  No renal or  ureteral stones are seen.  No perinephric stranding is appreciated.  No free fluid is identified.  The small bowel is unremarkable in appearance.  The stomach is within normal limits.  No acute vascular abnormalities are seen.  The appendix is normal in caliber, without evidence for appendicitis.  The colon is unremarkable in appearance.  The bladder is mildly distended and grossly unremarkable in appearance.  The prostate remains normal in size.  No inguinal lymphadenopathy is seen.  No acute osseous abnormalities are identified.  Mild vacuum phenomenon is noted at the left sacroiliac joint.  IMPRESSION: No acute abnormality seen in the abdomen or pelvis.   Original Report Authenticated By: Tonia Ghent, M.D.   Dg Chest 1 View  07/24/2013   CLINICAL DATA:  Short of breath, back pain  EXAM: CHEST - 1 VIEW Unable to obtain lateral view secondary to patient body habitus.  COMPARISON:  Prior chest x-ray 09/12/2012  FINDINGS: Cardiomegaly with left ventricular prominence. Pulmonary vascular congestion without overt edema. No large pleural effusion. No pneumothorax. No acute osseous abnormality.  IMPRESSION: Limited study secondary to patient body habitus and inability to obtain a lateral view.  Mild cardiomegaly with left ventricular prominence and pulmonary vascular congestion without overt edema.   Electronically Signed   By: Malachy Moan M.D.   On: 07/24/2013 23:13   Dg Lumbar Spine Complete  07/24/2013   CLINICAL DATA:  Low back pain  EXAM: LUMBAR SPINE - COMPLETE 4+ VIEW  COMPARISON:  11/12/2005  FINDINGS: Limited mobility for imaging due to pain. Prominent stool throughout the colon favors constipation. Body habitus reduces diagnostic sensitivity and specificity. There is reduced intervertebral disc height at L5-S1. No fracture or acute subluxation in the lumbar spine is observed.  IMPRESSION: 1. No fracture or acute subluxation. 2. Reduced intervertebral disc height at L5-S1 suggesting degenerative  disk disease. 3.  Prominent stool throughout the colon favors constipation. 4. Reduced sensitivity due to limited positioning mobility and body habitus.   Electronically Signed   By: Herbie Baltimore M.D.   On: 07/24/2013 21:09   Ct Shoulder Left Wo Contrast  07/18/2013   CLINICAL DATA:  Shoulder pain.  EXAM: CT OF THE LEFT SHOULDER WITHOUT CONTRAST  TECHNIQUE: Multidetector CT imaging was performed according to the standard protocol. Multiplanar CT image reconstructions were also generated.  COMPARISON:  Plain films earlier today.  FINDINGS: The study is limited by body habitus. There is no evidence of subluxation or dislocation. No definite evidence of fracture.  IMPRESSION: No definite acute bony abnormality. No evidence of dislocation as suggested by plain films. Study limited by body habitus.   Electronically Signed   By: Charlett Nose M.D.   On: 07/18/2013 06:02   Dg Shoulder Left  07/24/2013   CLINICAL DATA:  Left shoulder pain  EXAM: LEFT SHOULDER - 2+ VIEW  COMPARISON:  CT scan from 07/18/2013  FINDINGS: Suboptimal positioning due to the mobility, body habitus, and patient's discomfort.  Linear lucency in the acromion suggests os acromiale or less likely acromial fracture. Acromion clavicular distance measured at 1.1 cm, mildly abnormally widened. Negative predictive value of today's exam for shoulder dislocation is low.  IMPRESSION: 1. Potential AC joint widening with suspected os acromiale or acromial fracture. Correlate with point tenderness over the [facility] joint. 2. Reduced sensitivity from difficulty positioning.   Electronically Signed   By: Herbie Baltimore M.D.   On: 07/24/2013 21:14   Dg Shoulder Left  07/18/2013   CLINICAL DATA:  None are injury. Left shoulder pain.  EXAM: LEFT SHOULDER - 2+ VIEW  COMPARISON:  None.  FINDINGS: There appears to be posterior dislocation of the left shoulder. No fracture visualized.  IMPRESSION: Suspect posterior left shoulder dislocation.   Electronically Signed    By: Charlett Nose M.D.   On: 07/18/2013 03:02   Dg Foot 2 Views Left  07/24/2013   CLINICAL DATA:  Ulceration at the base of the foot; concern for osteomyelitis.  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 A large ulceration is noted along the plantar surface of the foot, apparently extending to the osseous structures. Underlying osteomyelitis cannot be excluded.  Visualized joint spaces are grossly preserved.  IMPRESSION: Large ulceration along the plantar aspect of the left foot, apparently extending to the osseous structures. Underlying osteomyelitis cannot be excluded. MRI could be considered for further evaluation, when and as deemed clinically appropriate.   Electronically Signed   By: Roanna Raider M.D.   On: 07/24/2013 23:29    Microbiology: Recent Results (from the past 240 hour(s))  CULTURE, BLOOD (ROUTINE X 2)     Status: None   Collection Time    07/24/13 10:10 PM      Result Value Range Status   Specimen Description BLOOD RIGHT FOREARM   Final   Special Requests BOTTLES DRAWN AEROBIC AND ANAEROBIC 3CC   Final   Culture  Setup Time     Final   Value: 07/25/2013 06:28     Performed at [facility]   Culture     Final   Value: GROUP B STREP(S.AGALACTIAE)ISOLATED     Note: Gram Stain Report Called to,Read Back By and Verified With: SHANNON YOUNG ON 07/25/2013 AT 9:53P BY WILEJ     Performed at [facility]   Report Status 07/27/2013 FINAL   Final   Organism ID, Bacteria GROUP B STREP(S.AGALACTIAE)ISOLATED   Final  CULTURE, BLOOD (ROUTINE X 2)     Status: None  Collection Time    07/24/13 10:15 PM      Result Value Range Status   Specimen Description BLOOD RIGHT ANTECUBITAL   Final   Special Requests BOTTLES DRAWN AEROBIC AND ANAEROBIC 3CC   Final   Culture  Setup Time     Final   Value: 07/25/2013 06:27     Performed at [facility]   Culture     Final   Value: GROUP B STREP(S.AGALACTIAE)ISOLATED     Note: SUSCEPTIBILITIES PERFORMED ON PREVIOUS CULTURE WITHIN THE LAST 5 DAYS.     Note: Gram Stain Report Called to,Read Back By and Verified With: SHANNON YOUNG ON 07/25/2013 AT 9:53P BY WILEJ     Performed at [facility]   Report Status 07/27/2013 FINAL   Final  MRSA PCR SCREENING     Status: None   Collection Time    07/25/13  2:49 AM      Result Value Range Status   MRSA by PCR NEGATIVE  NEGATIVE Final   Comment:            The GeneXpert MRSA Assay (FDA     approved for NASAL specimens     only), is one component of a     comprehensive MRSA colonization     surveillance program. It is not     intended to diagnose MRSA     infection nor to guide or     monitor treatment for     MRSA infections.  WOUND CULTURE     Status: None   Collection Time    07/26/13 12:50 PM      Result Value Range Status   Specimen Description FOOT   Final   Special Requests Normal   Final   Gram Stain     Final   Value: RARE WBC PRESENT, PREDOMINANTLY MONONUCLEAR     RARE SQUAMOUS EPITHELIAL CELLS PRESENT     NO ORGANISMS SEEN     Performed at [facility]   Culture     Final   Value: MULTIPLE ORGANISMS PRESENT, NONE PREDOMINANT NO STAPHYLOCOCCUS AUREUS ISOLATED NO GROUP A STREP (S.PYOGENES) ISOLATED     Performed at [facility]   Report Status 07/28/2013 FINAL   Final     Labs: Basic Metabolic Panel:  Recent Labs Lab 07/29/13 0550 07/30/13 0850 07/31/13 0405 08/01/13 0440 08/02/13 0520  NA 134* 137 136 137 139  K 4.9 5.8* 4.5 4.5 4.4  CL 107 114* 108 109 112  CO2 16* 11* 18* 18* 19  GLUCOSE 69* 168* 147* 125* 99  BUN 53* 52* 47* 40* 34*  CREATININE 2.30* 2.34* 2.07* 1.77* 1.87*  CALCIUM 8.0* 8.4 9.0 9.1 8.6   Liver Function Tests: No results found for this basename: AST, ALT, ALKPHOS, BILITOT, PROT, ALBUMIN,  in the last 168 hours No results found for this basename: LIPASE,  AMYLASE,  in the last 168 hours No results found for this basename: AMMONIA,  in the last 168 hours CBC:  Recent Labs Lab 07/29/13 0550 07/30/13 0500 07/31/13 0405 08/01/13 0440 08/02/13 0520  WBC 23.4* 21.8* 19.9* 19.5* 18.2*  HGB 7.9* 7.5* 7.3* 7.2* 7.1*  HCT 24.2* 23.5* 23.3* 23.4* 23.4*  MCV 82.9 83.9 85.3 85.4 86.3  PLT 410* 462* 554* 575* 599*   Cardiac Enzymes:  Recent Labs Lab 08/02/13 1140  TROPONINI <0.30   BNP: BNP (last 3 results) No results found for this basename: PROBNP,  in the last 8760 hours  CBG:  Recent Labs Lab 08/01/13 1128 08/01/13 1637 08/01/13 2128 08/02/13 0720 08/02/13 1143  GLUCAP 125* 96 125* 83 115*       Signed:  Bethany Hirt C  [facility] 08/02/2013, 2:28 PM

## 2013-08-02 NOTE — Progress Notes (Signed)
Patient and mother given all discharge instructions, with instructions and number to call [facility] for Open MRI.  MRI to put in proper order for patient to get MRI as an outpatient, with Dr. Salvatore Decent approval.  Number to schedule appointment with Dr. Lajoyce Corners was given on discharge instructions.  Mother verbalized that she is diabetic and will be giving patient his Lantus insulin.  Patient and mother agreeable to discharge plan.  [facility] verbalized that everything was in place for discharge and that IV antibiotics had been set up for tomorrow afternoon.  Case Manager [name] verbalized that patient was ready for discharge and that everything was in place.  Patient given pain medicine for comfortable transport on the way home.  Philomena Doheny

## 2013-08-03 LAB — GLUCOSE, CAPILLARY: Glucose-Capillary: 115 mg/dL — ABNORMAL HIGH (ref 70–99)

## 2013-08-17 ENCOUNTER — Other Ambulatory Visit: Payer: Self-pay

## 2013-08-17 ENCOUNTER — Encounter (HOSPITAL_COMMUNITY): Payer: Self-pay | Admitting: Emergency Medicine

## 2013-08-17 ENCOUNTER — Inpatient Hospital Stay (HOSPITAL_COMMUNITY)
Admission: EM | Admit: 2013-08-17 | Discharge: 2013-08-23 | DRG: 507 | Disposition: A | Discharge status: Home Health | Payer: PRIVATE HEALTH INSURANCE | Attending: Internal Medicine | Admitting: Internal Medicine

## 2013-08-17 ENCOUNTER — Ambulatory Visit
Admission: RE | Admit: 2013-08-17 | Discharge: 2013-08-17 | Disposition: A | Discharge status: Home / Self Care | Payer: PRIVATE HEALTH INSURANCE | Source: Ambulatory Visit | Attending: Internal Medicine | Admitting: Internal Medicine

## 2013-08-17 DIAGNOSIS — E662 Morbid (severe) obesity with alveolar hypoventilation: Secondary | ICD-10-CM | POA: Diagnosis present

## 2013-08-17 DIAGNOSIS — Z6841 Body Mass Index (BMI) 40.0 and over, adult: Secondary | ICD-10-CM

## 2013-08-17 DIAGNOSIS — M109 Gout, unspecified: Secondary | ICD-10-CM

## 2013-08-17 DIAGNOSIS — K219 Gastro-esophageal reflux disease without esophagitis: Secondary | ICD-10-CM | POA: Diagnosis present

## 2013-08-17 DIAGNOSIS — D62 Acute posthemorrhagic anemia: Secondary | ICD-10-CM | POA: Diagnosis not present

## 2013-08-17 DIAGNOSIS — E1129 Type 2 diabetes mellitus with other diabetic kidney complication: Secondary | ICD-10-CM

## 2013-08-17 DIAGNOSIS — F3289 Other specified depressive episodes: Secondary | ICD-10-CM | POA: Diagnosis present

## 2013-08-17 DIAGNOSIS — G4733 Obstructive sleep apnea (adult) (pediatric): Secondary | ICD-10-CM | POA: Diagnosis present

## 2013-08-17 DIAGNOSIS — IMO0002 Reserved for concepts with insufficient information to code with codable children: Secondary | ICD-10-CM | POA: Diagnosis present

## 2013-08-17 DIAGNOSIS — D631 Anemia in chronic kidney disease: Secondary | ICD-10-CM | POA: Diagnosis present

## 2013-08-17 DIAGNOSIS — D649 Anemia, unspecified: Secondary | ICD-10-CM

## 2013-08-17 DIAGNOSIS — E1165 Type 2 diabetes mellitus with hyperglycemia: Secondary | ICD-10-CM | POA: Diagnosis present

## 2013-08-17 DIAGNOSIS — Z794 Long term (current) use of insulin: Secondary | ICD-10-CM

## 2013-08-17 DIAGNOSIS — F411 Generalized anxiety disorder: Secondary | ICD-10-CM | POA: Diagnosis present

## 2013-08-17 DIAGNOSIS — L8995 Pressure ulcer of unspecified site, unstageable: Secondary | ICD-10-CM | POA: Diagnosis present

## 2013-08-17 DIAGNOSIS — F329 Major depressive disorder, single episode, unspecified: Secondary | ICD-10-CM | POA: Diagnosis present

## 2013-08-17 DIAGNOSIS — B951 Streptococcus, group B, as the cause of diseases classified elsewhere: Secondary | ICD-10-CM | POA: Diagnosis present

## 2013-08-17 DIAGNOSIS — E119 Type 2 diabetes mellitus without complications: Secondary | ICD-10-CM

## 2013-08-17 DIAGNOSIS — B999 Unspecified infectious disease: Secondary | ICD-10-CM

## 2013-08-17 DIAGNOSIS — E1169 Type 2 diabetes mellitus with other specified complication: Secondary | ICD-10-CM

## 2013-08-17 DIAGNOSIS — L89009 Pressure ulcer of unspecified elbow, unspecified stage: Secondary | ICD-10-CM | POA: Diagnosis present

## 2013-08-17 DIAGNOSIS — L8992 Pressure ulcer of unspecified site, stage 2: Secondary | ICD-10-CM | POA: Diagnosis present

## 2013-08-17 DIAGNOSIS — N058 Unspecified nephritic syndrome with other morphologic changes: Secondary | ICD-10-CM | POA: Diagnosis present

## 2013-08-17 DIAGNOSIS — I10 Essential (primary) hypertension: Secondary | ICD-10-CM | POA: Diagnosis present

## 2013-08-17 DIAGNOSIS — R7881 Bacteremia: Secondary | ICD-10-CM

## 2013-08-17 DIAGNOSIS — S98139A Complete traumatic amputation of one unspecified lesser toe, initial encounter: Secondary | ICD-10-CM

## 2013-08-17 DIAGNOSIS — D473 Essential (hemorrhagic) thrombocythemia: Secondary | ICD-10-CM

## 2013-08-17 DIAGNOSIS — L97509 Non-pressure chronic ulcer of other part of unspecified foot with unspecified severity: Secondary | ICD-10-CM | POA: Diagnosis present

## 2013-08-17 DIAGNOSIS — N184 Chronic kidney disease, stage 4 (severe): Secondary | ICD-10-CM | POA: Diagnosis present

## 2013-08-17 DIAGNOSIS — D638 Anemia in other chronic diseases classified elsewhere: Secondary | ICD-10-CM | POA: Diagnosis present

## 2013-08-17 DIAGNOSIS — R6510 Systemic inflammatory response syndrome (SIRS) of non-infectious origin without acute organ dysfunction: Secondary | ICD-10-CM

## 2013-08-17 DIAGNOSIS — D72829 Elevated white blood cell count, unspecified: Secondary | ICD-10-CM

## 2013-08-17 DIAGNOSIS — M009 Pyogenic arthritis, unspecified: Secondary | ICD-10-CM | POA: Diagnosis present

## 2013-08-17 DIAGNOSIS — L89899 Pressure ulcer of other site, unspecified stage: Secondary | ICD-10-CM | POA: Diagnosis present

## 2013-08-17 DIAGNOSIS — E11621 Type 2 diabetes mellitus with foot ulcer: Secondary | ICD-10-CM

## 2013-08-17 DIAGNOSIS — N183 Chronic kidney disease, stage 3 unspecified: Secondary | ICD-10-CM | POA: Diagnosis present

## 2013-08-17 DIAGNOSIS — I129 Hypertensive chronic kidney disease with stage 1 through stage 4 chronic kidney disease, or unspecified chronic kidney disease: Secondary | ICD-10-CM | POA: Diagnosis present

## 2013-08-17 LAB — CBC WITH DIFFERENTIAL/PLATELET
Basophils Absolute: 0 10*3/uL (ref 0.0–0.1)
Eosinophils Absolute: 1.1 10*3/uL — ABNORMAL HIGH (ref 0.0–0.7)
Eosinophils Relative: 8 % — ABNORMAL HIGH (ref 0–5)
Lymphs Abs: 1.6 10*3/uL (ref 0.7–4.0)
MCH: 26.5 pg (ref 26.0–34.0)
MCV: 84.7 fL (ref 78.0–100.0)
Monocytes Relative: 11 % (ref 3–12)
Neutrophils Relative %: 70 % (ref 43–77)
Platelets: 607 10*3/uL — ABNORMAL HIGH (ref 150–400)
RBC: 2.75 MIL/uL — ABNORMAL LOW (ref 4.22–5.81)
RDW: 14.3 % (ref 11.5–15.5)
WBC: 14.1 10*3/uL — ABNORMAL HIGH (ref 4.0–10.5)

## 2013-08-17 LAB — BASIC METABOLIC PANEL
BUN: 13 mg/dL (ref 6–23)
Calcium: 8.3 mg/dL — ABNORMAL LOW (ref 8.4–10.5)
GFR calc Af Amer: >90 mL/min (ref >90)
GFR calc non Af Amer: 78 mL/min — ABNORMAL LOW (ref >90)
Glucose, Bld: 89 mg/dL (ref 70–99)
Potassium: 3.8 mEq/L (ref 3.5–5.1)
Sodium: 135 mEq/L (ref 135–145)

## 2013-08-17 LAB — GLUCOSE, CAPILLARY
Glucose-Capillary: 83 mg/dL (ref 70–99)
Glucose-Capillary: 85 mg/dL (ref 70–99)

## 2013-08-17 MED ORDER — PIPERACILLIN-TAZOBACTAM 3.375 G IVPB 30 MIN
3.3750 g | INTRAVENOUS | Status: AC
  Administered 2013-08-17: 3.375 g via INTRAVENOUS
  Filled 2013-08-17: qty 50

## 2013-08-17 MED ORDER — VANCOMYCIN HCL 10 G IV SOLR
1500.0000 mg | Freq: Two times a day (BID) | INTRAVENOUS | Status: DC
  Administered 2013-08-18 – 2013-08-19 (×4): 1500 mg via INTRAVENOUS
  Filled 2013-08-17 (×7): qty 1500

## 2013-08-17 MED ORDER — INSULIN GLARGINE 100 UNITS/ML SOLOSTAR PEN: 22.0000 [IU] | PEN_INJECTOR | Freq: Two times a day (BID) | SUBCUTANEOUS | Status: DC

## 2013-08-17 MED ORDER — INSULIN ASPART 100 UNIT/ML ~~LOC~~ SOLN
0.0000 [IU] | SUBCUTANEOUS | Status: DC
Start: 2013-08-18 — End: 2013-08-20
  Administered 2013-08-18 – 2013-08-19 (×6): 2 [IU] via SUBCUTANEOUS
  Administered 2013-08-20: 3 [IU] via SUBCUTANEOUS
  Administered 2013-08-20 (×2): 2 [IU] via SUBCUTANEOUS

## 2013-08-17 MED ORDER — HEPARIN SODIUM (PORCINE) 5000 UNIT/ML IJ SOLN
5000.0000 [IU] | Freq: Three times a day (TID) | INTRAMUSCULAR | Status: DC
  Administered 2013-08-18 (×2): 5000 [IU] via SUBCUTANEOUS
  Filled 2013-08-17 (×5): qty 1

## 2013-08-17 MED ORDER — DEXTROSE 5 % IV SOLN: 2.0000 g | INTRAVENOUS | Status: DC

## 2013-08-17 MED ORDER — OXYCODONE HCL ER 15 MG PO T12A
15.0000 mg | EXTENDED_RELEASE_TABLET | Freq: Two times a day (BID) | ORAL | Status: DC
  Administered 2013-08-18 – 2013-08-19 (×4): 15 mg via ORAL
  Filled 2013-08-17 (×4): qty 1

## 2013-08-17 MED ORDER — SODIUM CHLORIDE 0.9 % IJ SOLN
3.0000 mL | Freq: Two times a day (BID) | INTRAMUSCULAR | Status: DC
  Administered 2013-08-18 – 2013-08-19 (×2): 3 mL via INTRAVENOUS

## 2013-08-17 MED ORDER — PIPERACILLIN-TAZOBACTAM 3.375 G IVPB
3.3750 g | Freq: Three times a day (TID) | INTRAVENOUS | Status: DC
  Administered 2013-08-18 – 2013-08-20 (×7): 3.375 g via INTRAVENOUS
  Filled 2013-08-17 (×13): qty 50

## 2013-08-17 MED ORDER — VANCOMYCIN HCL 10 G IV SOLR
2500.0000 mg | INTRAVENOUS | Status: AC
  Administered 2013-08-17: 2500 mg via INTRAVENOUS
  Filled 2013-08-17: qty 2500

## 2013-08-17 MED ORDER — SENNOSIDES-DOCUSATE SODIUM 8.6-50 MG PO TABS: 1.0000 | ORAL_TABLET | Freq: Two times a day (BID) | ORAL | Status: DC | PRN

## 2013-08-17 MED ORDER — HYDROMORPHONE HCL PF 1 MG/ML IJ SOLN
1.0000 mg | Freq: Once | INTRAMUSCULAR | Status: AC
  Administered 2013-08-17: 1 mg via INTRAVENOUS
  Filled 2013-08-17: qty 1

## 2013-08-17 NOTE — H&P (Signed)
[facility] History and Physical  Adrian Khan ZOX:[id number] DOB: 11-Feb-1976 DOA: 08/17/2013  Referring physician: ED PCP: Jeri Cos, MD   Chief Complaint: Shoulder pain  HPI: Adrian Khan is a 37 y.o. male who presents to the ED with ongoing complaints of L sided arm pain for the past 5-6 weeks.  This pain has been present through his previous admission to the hospital when he was treated for GBS sepsis with likely foot as the initial source.  Dr. Zenaida Niece Dam's last progress note dated on 10/16 documents that at that time there was a high degree of suspicion that he may also have septic arthritis of his shoulder as well, however due to patient obesity he would need to get MRI as outpatient.  He was discharged on IV rocephin for GBS positive blood cultures which was sensitive to this.  MRI was performed as an outpatient today, and unfortunately it appears as though Dr. Daiva Eves is indeed correct.  The patient has a large shoulder joint effusion, multiple fluid collections concerning for small abscesses around the joint capsule, and pyomyositis going down his biceps as well.  Review of Systems: 12 systems reviewed and otherwise negative.  Past Medical History  Diagnosis Date  . Shortness of breath     with exertion  . PONV (postoperative nausea and vomiting)   . Diabetes mellitus without complication   . Anxiety   . Mental disorder   . Depression   . Sleep apnea     done at [facility].  Marland Kitchen Pneumonia   . GERD (gastroesophageal reflux disease)   . Constipation   . Neuropathy     feet, hands  . Anginal pain     chest pressure has increased in the past few days; patient denied  on 09/20/12   Past Surgical History  Procedure Laterality Date  . I&d extremity  12/22/2011    Procedure: IRRIGATION AND DEBRIDEMENT EXTREMITY;  Surgeon: Kennieth Rad, MD;  Location: [facility] OR;  Service: Orthopedics;  Laterality: Left;  . I&d extremity  12/24/2011    Procedure: IRRIGATION AND DEBRIDEMENT  EXTREMITY;  Surgeon: Kennieth Rad, MD;  Location: [facility] OR;  Service: Orthopedics;  Laterality: Left;  Irrigation and debridement left foot.  . I&d extremity  12/26/2011    Procedure: IRRIGATION AND DEBRIDEMENT EXTREMITY;  Surgeon: Kennieth Rad, MD;  Location: [facility] OR;  Service: Orthopedics;  Laterality: Left;  . I&d extremity  12/28/2011    Procedure: IRRIGATION AND DEBRIDEMENT EXTREMITY;  Surgeon: Kennieth Rad, MD;  Location: [facility] OR;  Service: Orthopedics;  Laterality: Left;  I&D Left Foot;Application of  Wound Vac.to dorsal and plantar areas of left foot  . Eye surgery    . Toe surgery      Bone removed- 2 toe right  . Wisdom tooth extraction    . Amputation  09/22/2012    Procedure: AMPUTATION FOOT;  Surgeon: Nadara Mustard, MD;  Location: [facility] OR;  Service: Orthopedics;  Laterality: Left;  Left Midfoot Amputation   Social History:  reports that he has never smoked. He has never used smokeless tobacco. He reports that he does not drink alcohol or use illicit drugs.   Allergies  Allergen Reactions  . Sulfonamide Derivatives Other (See Comments)    cramps    History reviewed. No pertinent family history.  Prior to Admission medications   Medication Sig Start Date End Date Taking? Authorizing Provider  dextrose 5 % SOLN 50 mL with cefTRIAXone 2 G SOLR  2 g Inject 2 g into the vein daily. 08/02/13 09/06/13 Yes Adeline C Viyuoh, MD  insulin glargine (LANTUS) 100 units/mL SOLN Inject 22 Units into the skin 2 (two) times daily. 08/02/13  Yes Adeline Joselyn Glassman, MD  OxyCODONE (OXYCONTIN) 15 mg T12A 12 hr tablet Take 15 mg by mouth every 12 (twelve) hours. 08/02/13  Yes Adeline C Viyuoh, MD  senna-docusate (SENOKOT-S) 8.6-50 MG per tablet Take 1 tablet by mouth 2 (two) times daily as needed for constipation. 08/02/13  Yes Adeline Joselyn Glassman, MD  silver sulfADIAZINE (SILVADENE) 1 % cream Apply 1 application topically daily. 08/02/13  Yes Kela Millin, MD   Physical Exam: Filed Vitals:    08/17/13 2022  BP: 88/68  Pulse:   Temp:   Resp:     General:  NAD, resting comfortably in bed Eyes: PEERLA EOMI ENT: mucous membranes moist Neck: supple w/o JVD Cardiovascular: RRR w/o MRG Respiratory: CTA B Abdomen: soft, nt, nd, bs+ Skin: no rash nor lesion Musculoskeletal: Wound on L foot from amputation, dry gangrene on R foot, induration of arm. Psychiatric: normal tone and affect Neurologic: AAOx3, grossly non-focal  Labs on Admission:  Basic Metabolic Panel:  Recent Labs Lab 08/17/13 1850  NA 135  K 3.8  CL 106  CO2 21  GLUCOSE 89  BUN 13  CREATININE 1.19  CALCIUM 8.3*   Liver Function Tests: No results found for this basename: AST, ALT, ALKPHOS, BILITOT, PROT, ALBUMIN,  in the last 168 hours No results found for this basename: LIPASE, AMYLASE,  in the last 168 hours No results found for this basename: AMMONIA,  in the last 168 hours CBC:  Recent Labs Lab 08/17/13 1850  WBC 14.1*  NEUTROABS 9.8*  HGB 7.3*  HCT 23.3*  MCV 84.7  PLT 607*   Cardiac Enzymes: No results found for this basename: CKTOTAL, CKMB, CKMBINDEX, TROPONINI,  in the last 168 hours  BNP (last 3 results) No results found for this basename: PROBNP,  in the last 8760 hours CBG:  Recent Labs Lab 08/17/13 1842 08/17/13 1954  GLUCAP 83 85    Radiological Exams on Admission: Mr Outside Films Lower Extremity  08/17/2013   This examination belongs to an outside facility and is stored  here for comparison purposes only.  Contact the originating outside  institution for any associated report or interpretation.   EKG: Independently reviewed.  Assessment/Plan Principal Problem:   Septic arthritis of shoulder, left Active Problems:   DIABETES, TYPE 2   Diabetic foot ulcer   1. Septic arthritis of shoulder, left - see also Dr. Zenaida Niece Dam's progress note on 10/16, MRI would appear to confirm the presence of septic arthritis and pyomyositis, spoke with Dr. Lajoyce Corners who will evaluate in  hospital tomorrow, keeping NPO for possible OR.  Given worsening of symptoms broadening Abx to zosyn and vancomycin.  Likely needs ID consult in AM.  Diabetic ulcer of his foot is likely the source of this infection and bacteremia.  Given metastatic site of shoulder also question if TEE is indicated.  Checking blood cultures but these would likely be negative even in the case of endocarditis at this point given several weeks on rocephin at this point. 2. DM2 - holding home lantus and putting patient on SSI q4h given that we are keeping him NPO for possible surgery tomorrow.    Code Status: Full (must indicate code status--if unknown or must be presumed, indicate so) Family Communication: Mother at bedside (indicate person spoken with,  if applicable, with phone number if by telephone) Disposition Plan: Admit to inpatient (indicate anticipated LOS)  Time spent: 70 min  Adrian Khan M. [facility] Pager [phone]  If 7PM-7AM, please contact night-coverage [email or web address] Password [email or web address] 08/17/2013, 11:10 PM

## 2013-08-17 NOTE — Progress Notes (Signed)
Report called to 4th floor. 

## 2013-08-17 NOTE — ED Notes (Signed)
Bed: WA20 Expected date:  Expected time:  Means of arrival:  Comments: ems- arm pain

## 2013-08-17 NOTE — Progress Notes (Signed)
ANTIBIOTIC CONSULT NOTE - INITIAL  Pharmacy Consult for vancomycin/Zosyn Indication: ?arm infection, hx GBS bacteremia  Allergies  Allergen Reactions  . Sulfonamide Derivatives Other (See Comments)    cramps    Patient Measurements:   As of 10/8 - 185 kg  Vital Signs: Temp: 98.4 F (36.9 C) (10/31 1841) Temp src: Oral (10/31 1841) BP: 88/68 mmHg (10/31 2022) Pulse Rate: 125 (10/31 2020) Intake/Output from previous day:   Intake/Output from this shift:    Labs:  Recent Labs  08/17/13 1850  WBC 14.1*  HGB 7.3*  PLT 607*  CREATININE 1.19   The CrCl is unknown because both a height and weight (above a minimum accepted value) are required for this calculation. No results found for this basename: VANCOTROUGH, VANCOPEAK, VANCORANDOM, GENTTROUGH, GENTPEAK, GENTRANDOM, TOBRATROUGH, TOBRAPEAK, TOBRARND, AMIKACINPEAK, AMIKACINTROU, AMIKACIN,  in the last 72 hours   Microbiology: Recent Results (from the past 720 hour(s))  CULTURE, BLOOD (ROUTINE X 2)     Status: None   Collection Time    07/24/13 10:10 PM      Result Value Range Status   Specimen Description BLOOD RIGHT FOREARM   Final   Special Requests BOTTLES DRAWN AEROBIC AND ANAEROBIC 3CC   Final   Culture  Setup Time     Final   Value: 07/25/2013 06:28     Performed at [facility]   Culture     Final   Value: GROUP B STREP(S.AGALACTIAE)ISOLATED     Note: Gram Stain Report Called to,Read Back By and Verified With: SHANNON YOUNG ON 07/25/2013 AT 9:53P BY WILEJ     Performed at [facility]   Report Status 07/27/2013 FINAL   Final   Organism ID, Bacteria GROUP B STREP(S.[email or web address])ISOLATED   Final  CULTURE, BLOOD (ROUTINE X 2)     Status: None   Collection Time    07/24/13 10:15 PM      Result Value Range Status   Specimen Description BLOOD RIGHT ANTECUBITAL   Final   Special Requests BOTTLES DRAWN AEROBIC AND ANAEROBIC 3CC   Final   Culture  Setup Time     Final   Value: 07/25/2013 06:27   Performed at [facility]   Culture     Final   Value: GROUP B STREP(S.AGALACTIAE)ISOLATED     Note: SUSCEPTIBILITIES PERFORMED ON PREVIOUS CULTURE WITHIN THE LAST 5 DAYS.     Note: Gram Stain Report Called to,Read Back By and Verified With: SHANNON YOUNG ON 07/25/2013 AT 9:53P BY WILEJ     Performed at [facility]   Report Status 07/27/2013 FINAL   Final  MRSA PCR SCREENING     Status: None   Collection Time    07/25/13  2:49 AM      Result Value Range Status   MRSA by PCR NEGATIVE  NEGATIVE Final   Comment:            The GeneXpert MRSA Assay (FDA     approved for NASAL specimens     only), is one component of a     comprehensive MRSA colonization     surveillance program. It is not     intended to diagnose MRSA     infection nor to guide or     monitor treatment for     MRSA infections.  WOUND CULTURE     Status: None   Collection Time    07/26/13 12:50 PM      Result Value Range Status  Specimen Description FOOT   Final   Special Requests Normal   Final   Gram Stain     Final   Value: RARE WBC PRESENT, PREDOMINANTLY MONONUCLEAR     RARE SQUAMOUS EPITHELIAL CELLS PRESENT     NO ORGANISMS SEEN     Performed at [facility]   Culture     Final   Value: MULTIPLE ORGANISMS PRESENT, NONE PREDOMINANT NO STAPHYLOCOCCUS AUREUS ISOLATED NO GROUP A STREP (S.PYOGENES) ISOLATED     Performed at [facility]   Report Status 07/28/2013 FINAL   Final    Medical History: Past Medical History  Diagnosis Date  . Shortness of breath     with exertion  . PONV (postoperative nausea and vomiting)   . Diabetes mellitus without complication   . Anxiety   . Mental disorder   . Depression   . Sleep apnea     done at [facility].  Marland Kitchen Pneumonia   . GERD (gastroesophageal reflux disease)   . Constipation   . Neuropathy     feet, hands  . Anginal pain     chest pressure has increased in the past few days; patient denied  on 09/20/12    Medications:  Scheduled:   Infusions:   Assessment: 37 yo male with hx DM, morbid obesity and prior mid foot amputation presented to ER with swollen and painful left arm after being at [facility]. Patient with recent hospitalization for GBS bacteremia and left shoulder pain with new ulcer at amputation site. Per ID, patient was discharged on Rocephin 2g IV daily to complete 6 weeks of treatment that would have finished on 11/20. Today 10/31 would be Day 23/42 of Rocephin  To start vancomycin and Zosyn per pharmacy dosing for potential new infection  CrCl normalized = 88 ml/min/1.42m2  WBC elevated  afebrile   Goal of Therapy:  Vancomycin trough level 15-20 mcg/ml  Plan:  1) Vancomycin loading dose 2500mg  IV x 1 then  2) Vancomycin 1500mg  IV q12 3) Zosyn 3.375g IV q8 (extended interval infusion)   Hessie Knows, PharmD, BCPS Pager [phone] 08/17/2013 8:27 PM

## 2013-08-17 NOTE — ED Notes (Signed)
Pt comes in from [facility] to be evaluated for swollen arm. Pt was here 2 weeks ago and was discharged home on IV antibiotics with a PICC. Pt was to large to fit in a MRI machine at the time because of the swelling in his arm. Pt still has a lot of pain in his left arm. Mother has the disc with her from [facility].

## 2013-08-17 NOTE — ED Provider Notes (Signed)
CSN: [id number]     Arrival date & time 08/17/13  1725 History   First MD Initiated Contact with Patient 08/17/13 1751     Chief Complaint  Patient presents with  . Arm Pain     HPI  Adrian Khan is a 37 y.o. male with a PMH of DM, pneumonia, SOB, anxiety, depression, GERD, constipation, neuropathy, and chest pain who presents to the ED for evaluation of arm pain.  History was provided by the patient.  Patient states that he has had left-sided arm pain for the past 5-6 weeks. Patient was admitted for left arm pain on 07/24/13 and discharged on 08/02/13 for her sepsis. He was started on Vanco and Zosyn for a possible osteomyelitis in the left foot. Patient grew group B strep in his left foot. He was transitioned to ceftriaxone and has been getting ceftriaxone 2 g daily since discharge.  He was given his last dose last night via PICC line. Patient also had x-rays done of his left shoulder which were suspicious for an acromial fracture or gout. They were unable to do a shoulder aspiration at that time due to his morbid obesity.  They were unable to get a CT scan or MRI to to his obesity.  Patient went to get an MRI done at [facility] today of his left shoulder. Patient was sent to the ED for further evaluation and management 2 to increased swelling and pain. He has been taking oxycodone for pain. He has been having alternating chills and subjective fever. He had decreased appetite and intake which has been mildly improving since discharge. He also has some numbness and tingling in his left arm.  He states that his pain begins in the left side of his neck and radiates down to his fingers.  His pain is constant and described as an aching pain.  Movement and palpation make his pain worse. He denies any trauma or injuries. He denies any loss of sensation or focal weakness. He denies any headache, rhinorrhea, chest pain, shortness of breath, abdominal pain, nausea, vomiting, diarrhea, constipation, or dysuria.      Past Medical History  Diagnosis Date  . Shortness of breath     with exertion  . PONV (postoperative nausea and vomiting)   . Diabetes mellitus without complication   . Anxiety   . Mental disorder   . Depression   . Sleep apnea     done at [facility].  Marland Kitchen Pneumonia   . GERD (gastroesophageal reflux disease)   . Constipation   . Neuropathy     feet, hands  . Anginal pain     chest pressure has increased in the past few days; patient denied  on 09/20/12   Past Surgical History  Procedure Laterality Date  . I&d extremity  12/22/2011    Procedure: IRRIGATION AND DEBRIDEMENT EXTREMITY;  Surgeon: Kennieth Rad, MD;  Location: [facility] OR;  Service: Orthopedics;  Laterality: Left;  . I&d extremity  12/24/2011    Procedure: IRRIGATION AND DEBRIDEMENT EXTREMITY;  Surgeon: Kennieth Rad, MD;  Location: [facility] OR;  Service: Orthopedics;  Laterality: Left;  Irrigation and debridement left foot.  . I&d extremity  12/26/2011    Procedure: IRRIGATION AND DEBRIDEMENT EXTREMITY;  Surgeon: Kennieth Rad, MD;  Location: [facility] OR;  Service: Orthopedics;  Laterality: Left;  . I&d extremity  12/28/2011    Procedure: IRRIGATION AND DEBRIDEMENT EXTREMITY;  Surgeon: Kennieth Rad, MD;  Location: [facility] OR;  Service: Orthopedics;  Laterality:  Left;  I&D Left Foot;Application of  Wound Vac.to dorsal and plantar areas of left foot  . Eye surgery    . Toe surgery      Bone removed- 2 toe right  . Wisdom tooth extraction    . Amputation  09/22/2012    Procedure: AMPUTATION FOOT;  Surgeon: Nadara Mustard, MD;  Location: [facility] OR;  Service: Orthopedics;  Laterality: Left;  Left Midfoot Amputation   No family history on file. History  Substance Use Topics  . Smoking status: Never Smoker   . Smokeless tobacco: Never Used  . Alcohol Use: No    Review of Systems  Constitutional: Positive for fever (subjective), chills and appetite change. Negative for diaphoresis, activity change and fatigue.  HENT: Negative for congestion, ear  pain, rhinorrhea and sore throat.   Respiratory: Negative for cough, shortness of breath and wheezing.   Cardiovascular: Negative for chest pain and leg swelling.  Gastrointestinal: Negative for nausea, vomiting, abdominal pain, diarrhea and constipation.  Genitourinary: Negative for dysuria.  Musculoskeletal: Positive for joint swelling, myalgias and neck pain.  Skin: Positive for wound.  Neurological: Positive for numbness. Negative for dizziness, weakness, light-headedness and headaches.    Allergies  Sulfonamide derivatives  Home Medications   Current Outpatient Rx  Name  Route  Sig  Dispense  Refill  . dextrose 5 % SOLN 50 mL with cefTRIAXone 2 G SOLR 2 g   Intravenous   Inject 2 g into the vein daily.           Through 11/20 (to complete total of 6weeks of anti ...   . insulin glargine (LANTUS) 100 units/mL SOLN   Subcutaneous   Inject 22 Units into the skin 2 (two) times daily.   10 mL   0   . OxyCODONE (OXYCONTIN) 15 mg T12A 12 hr tablet   Oral   Take 1 tablet (15 mg total) by mouth every 12 (twelve) hours.   60 tablet   0   . oxyCODONE-acetaminophen (PERCOCET) 5-325 MG per tablet   Oral   Take 2 tablets by mouth every 4 (four) hours as needed for pain.   30 tablet   0   . senna-docusate (SENOKOT-S) 8.6-50 MG per tablet   Oral   Take 1 tablet by mouth 2 (two) times daily as needed.   60 tablet   0   . silver sulfADIAZINE (SILVADENE) 1 % cream   Topical   Apply topically daily.   50 g   0    BP 156/88  Pulse 108  Temp(Src) 98.2 F (36.8 C) (Oral)  Resp 20  SpO2 98%  Filed Vitals:   08/17/13 1841 08/17/13 2020 08/17/13 2022 08/17/13 2317  BP: 158/87 75/49 88/68  129/98  Pulse: 107 125  105  Temp: 98.4 F (36.9 C)   98 F (36.7 C)  TempSrc: Oral   Oral  Resp: 19 19  20   SpO2: 100% 97%  99%    Physical Exam  Nursing note and vitals reviewed. Constitutional: He is oriented to person, place, and time. He appears well-developed and  well-nourished. No distress.  Morbidly obese   HENT:  Head: Normocephalic and atraumatic.  Right Ear: External ear normal.  Left Ear: External ear normal.  Nose: Nose normal.  Mouth/Throat: Oropharynx is clear and moist. No oropharyngeal exudate.  Eyes: Conjunctivae are normal. Pupils are equal, round, and reactive to light. Right eye exhibits no discharge. Left eye exhibits no discharge.  Neck: Normal range of  motion. Neck supple.  Cardiovascular: Normal rate, regular rhythm, normal heart sounds and intact distal pulses.  Exam reveals no gallop and no friction rub.   No murmur heard. Radial pulses present bilaterally  Pulmonary/Chest: Effort normal and breath sounds normal. No respiratory distress. He has no wheezes. He has no rales. He exhibits no tenderness.  Abdominal: Soft. Bowel sounds are normal. He exhibits no distension and no mass. There is no tenderness. There is no rebound and no guarding.  Musculoskeletal: Normal range of motion. He exhibits edema and tenderness.  Tenderness to palpation to the left shoulder and UE throughout.  Edema to the left UE throughout.  5 cm x 5 cm area of circular erythema to the left lateral middle biceps with no open wound and mild fluctuance/edema and no induration.  Small 1 cm x 1 cm circular ulcer to the left olecranon, which is closed and no active drainage/surounding erythema.  Pitting 1+ edema in the left hand.  Patient unable to move UE due to pain.  Can flex and extend digits, however, is limited due to pain.    Neurological: He is alert and oriented to person, place, and time.  Skin: Skin is warm and dry. He is not diaphoretic. There is erythema.  3 cm x 3 cm ulcer present on the left foot overlying the metatarsals with surrounding erythema and no drainage.      ED Course  Procedures (including critical care time) Labs Review Labs Reviewed - No data to display Imaging Review No results found.  EKG Interpretation   None      Results  for orders placed during the hospital encounter of 08/17/13  CBC WITH DIFFERENTIAL      Result Value Range   WBC 14.1 (*) 4.0 - 10.5 K/uL   RBC 2.75 (*) 4.22 - 5.81 MIL/uL   Hemoglobin 7.3 (*) 13.0 - 17.0 g/dL   HCT 16.1 (*) 09.6 - 04.5 %   MCV 84.7  78.0 - 100.0 fL   MCH 26.5  26.0 - 34.0 pg   MCHC 31.3  30.0 - 36.0 g/dL   RDW 40.9  81.1 - 91.4 %   Platelets 607 (*) 150 - 400 K/uL   Neutrophils Relative % 70  43 - 77 %   Neutro Abs 9.8 (*) 1.7 - 7.7 K/uL   Lymphocytes Relative 11 (*) 12 - 46 %   Lymphs Abs 1.6  0.7 - 4.0 K/uL   Monocytes Relative 11  3 - 12 %   Monocytes Absolute 1.6 (*) 0.1 - 1.0 K/uL   Eosinophils Relative 8 (*) 0 - 5 %   Eosinophils Absolute 1.1 (*) 0.0 - 0.7 K/uL   Basophils Relative 0  0 - 1 %   Basophils Absolute 0.0  0.0 - 0.1 K/uL  BASIC METABOLIC PANEL      Result Value Range   Sodium 135  135 - 145 mEq/L   Potassium 3.8  3.5 - 5.1 mEq/L   Chloride 106  96 - 112 mEq/L   CO2 21  19 - 32 mEq/L   Glucose, Bld 89  70 - 99 mg/dL   BUN 13  6 - 23 mg/dL   Creatinine, Ser 7.82  0.50 - 1.35 mg/dL   Calcium 8.3 (*) 8.4 - 10.5 mg/dL   GFR calc non Af Amer 78 (*) >90 mL/min   GFR calc Af Amer >90  >90 mL/min  GLUCOSE, CAPILLARY      Result Value Range   Glucose-Capillary  83  70 - 99 mg/dL  GLUCOSE, CAPILLARY      Result Value Range   Glucose-Capillary 85  70 - 99 mg/dL   Comment 1 Documented in Chart     Comment 2 Notify RN    GLUCOSE, CAPILLARY      Result Value Range   Glucose-Capillary 123 (*) 70 - 99 mg/dL    MDM   1. Joint infection   2. Leukocytosis   3. Thrombocytosis   4. Anemia   5. Diabetes mellitus   6. Diabetic foot ulcer   7. Septic arthritis of shoulder, left   8. SIRS (systemic inflammatory response syndrome)   9. Type II or unspecified type diabetes mellitus without mention of complication, not stated as uncontrolled     Adrian Khan is a 37 y.o. male with a PMH of DM, pneumonia, SOB, anxiety, depression, GERD,  constipation, neuropathy, and chest pain who presents to the ED for evaluation of arm pain.  CBC, BMP ordered.     Rechecks  8:00 PM = Pain improved.  Patient resting comfortably in bed.  Asking for something to eat.  9:20 PM = Bedside US performed by myself and Dr. Elesa Massed.  No evidence of an abscess to the left biceps.     Consults  8:11 PM = Dr. Elesa Massed spoke with Dr. Eppie Gibson with radiology who did a unofficial reading of the MRI which was uploaded.  Scan suspicious for infections etiology of pain.  Will start Vanco and Zosyn.   10:12 PM = Spoke with Dr. Julian Reil who agrees to admit the patient.      Etiology of arm pain likely due to to an infectious process of the left shoulder and arm. The unofficial read of the MRI shows evidence of infection. Patient was afebrile. His evidence of leukocytosis which appears to be improving from his baseline. Patient has been on IV Rocephin for several weeks. He will not be started on IV vancomycin and Zosyn the emergency department. Patient has anemia and thrombocytosis on his labs which appears to be his baseline.  Patient was in agreement with admission and plan.     Final impressions: 1. Joint infection, left shoulder  2. SIRS  3. Diabetes  4. Anemia 5. Thrombocytosis  6. Leukocytosis  7. Diabetic foot ulcer     Luiz Iron PA-C   This patient was discussed with Dr. Meryl Dare, PA-C 08/18/13 [phone]

## 2013-08-17 NOTE — Progress Notes (Signed)
Awaiting return call from the unit about bari bed prior to transport.

## 2013-08-17 NOTE — Progress Notes (Signed)
EDCM spoke to patient and patient's mother at bedside.  Patient currently has a Science writer and nursing aide with [facility] at home.  Patient's mother reports he has a hospital  bed, walker and a wheelchair at home.  Patient currently lives with his mother.

## 2013-08-17 NOTE — ED Provider Notes (Signed)
Medical screening examination/treatment/procedure(s) were conducted as a shared visit with non-physician practitioner(s) and myself.  I personally evaluated the patient during the encounter.  EKG Interpretation   None        Patient is a 37 year old morbidly obese male with a history of insulin-dependent diabetes is currently being treated with ceftriaxone for a right upper extremity PICC line for lower extremity osteomyelitis. He has had increasing pain and swelling of his left upper extremity for the past 2 weeks. He's had subjective fevers. He states his pain is worse with movement of his arm. No history of injury. On exam, patient has significant swelling and tenderness diffusely throughout the left upper extremity is neurovascularly intact distally. Bedside ultrasound does not reveal any obvious abscess but his exam is concerning for cellulitis.  Discussed with radiology, Dr. Eppie Gibson, who evaluated patient's left shoulder MRI shows a large joint effusion with multiple small fluid pockets concerning for abscess and the soft tissue edema concerning for cellulitis. Have recommended admission and broadening patient's antibiotics given his worsening pain, swelling.   Adrian Maw Elden Brucato, DO 08/17/13 2048

## 2013-08-18 ENCOUNTER — Encounter (HOSPITAL_COMMUNITY)
Admission: EM | Disposition: A | Discharge status: Home Health | Payer: Self-pay | Source: Home / Self Care | Attending: Internal Medicine

## 2013-08-18 ENCOUNTER — Encounter (HOSPITAL_COMMUNITY): Payer: PRIVATE HEALTH INSURANCE | Admitting: Anesthesiology

## 2013-08-18 ENCOUNTER — Inpatient Hospital Stay (HOSPITAL_COMMUNITY): Payer: PRIVATE HEALTH INSURANCE

## 2013-08-18 ENCOUNTER — Encounter (HOSPITAL_COMMUNITY): Payer: Self-pay | Admitting: Anesthesiology

## 2013-08-18 ENCOUNTER — Inpatient Hospital Stay (HOSPITAL_COMMUNITY): Payer: PRIVATE HEALTH INSURANCE | Admitting: Anesthesiology

## 2013-08-18 DIAGNOSIS — D638 Anemia in other chronic diseases classified elsewhere: Secondary | ICD-10-CM

## 2013-08-18 DIAGNOSIS — M009 Pyogenic arthritis, unspecified: Secondary | ICD-10-CM

## 2013-08-18 DIAGNOSIS — I10 Essential (primary) hypertension: Secondary | ICD-10-CM | POA: Diagnosis present

## 2013-08-18 DIAGNOSIS — M79609 Pain in unspecified limb: Secondary | ICD-10-CM

## 2013-08-18 HISTORY — PX: SHOULDER ARTHROSCOPY: SHX128

## 2013-08-18 LAB — GLUCOSE, CAPILLARY
Glucose-Capillary: 123 mg/dL — ABNORMAL HIGH (ref 70–99)
Glucose-Capillary: 95 mg/dL (ref 70–99)

## 2013-08-18 LAB — CBC
HCT: 22.2 % — ABNORMAL LOW (ref 39.0–52.0)
Hemoglobin: 6.8 g/dL — CL (ref 13.0–17.0)
MCH: 26.2 pg (ref 26.0–34.0)
MCV: 85.4 fL (ref 78.0–100.0)
Platelets: 598 10*3/uL — ABNORMAL HIGH (ref 150–400)
RBC: 2.6 MIL/uL — ABNORMAL LOW (ref 4.22–5.81)
RDW: 14.5 % (ref 11.5–15.5)
WBC: 15.3 10*3/uL — ABNORMAL HIGH (ref 4.0–10.5)

## 2013-08-18 LAB — BASIC METABOLIC PANEL
BUN: 14 mg/dL (ref 6–23)
CO2: 20 mEq/L (ref 19–32)
Calcium: 8.2 mg/dL — ABNORMAL LOW (ref 8.4–10.5)
Chloride: 106 mEq/L (ref 96–112)
Creatinine, Ser: 1.3 mg/dL (ref 0.50–1.35)
Potassium: 3.9 mEq/L (ref 3.5–5.1)
Sodium: 136 mEq/L (ref 135–145)

## 2013-08-18 LAB — ABO/RH: ABO/RH(D): O POS

## 2013-08-18 LAB — MRSA PCR SCREENING: MRSA by PCR: NEGATIVE

## 2013-08-18 SURGERY — ARTHROSCOPY, SHOULDER
Anesthesia: General | Site: Shoulder | Laterality: Left | Wound class: Dirty or Infected

## 2013-08-18 MED ORDER — LACTATED RINGERS IV SOLN
INTRAVENOUS | Status: DC | PRN
  Administered 2013-08-18: 14:00:00 via INTRAVENOUS

## 2013-08-18 MED ORDER — SODIUM CHLORIDE 0.9 % IR SOLN
Status: DC | PRN
  Administered 2013-08-18: 6000 mL

## 2013-08-18 MED ORDER — NEOSTIGMINE METHYLSULFATE 1 MG/ML IJ SOLN
INTRAMUSCULAR | Status: DC | PRN
  Administered 2013-08-18: 3 mg via INTRAVENOUS

## 2013-08-18 MED ORDER — ONDANSETRON HCL 4 MG/2ML IJ SOLN
INTRAMUSCULAR | Status: DC | PRN
  Administered 2013-08-18: 4 mg via INTRAVENOUS

## 2013-08-18 MED ORDER — GLYCOPYRROLATE 0.2 MG/ML IJ SOLN
INTRAMUSCULAR | Status: DC | PRN
  Administered 2013-08-18: 0.4 mg via INTRAVENOUS

## 2013-08-18 MED ORDER — HYDROMORPHONE HCL PF 1 MG/ML IJ SOLN
0.5000 mg | INTRAMUSCULAR | Status: DC | PRN
  Administered 2013-08-18 – 2013-08-22 (×15): 1 mg via INTRAVENOUS
  Filled 2013-08-18 (×14): qty 1

## 2013-08-18 MED ORDER — BUPIVACAINE HCL (PF) 0.25 % IJ SOLN
INTRAMUSCULAR | Status: AC
  Filled 2013-08-18: qty 30

## 2013-08-18 MED ORDER — ONDANSETRON HCL 4 MG PO TABS: 4.0000 mg | ORAL_TABLET | Freq: Four times a day (QID) | ORAL | Status: DC | PRN

## 2013-08-18 MED ORDER — OXYCODONE HCL 5 MG PO TABS
5.0000 mg | ORAL_TABLET | Freq: Once | ORAL | Status: AC | PRN
  Administered 2013-08-18: 5 mg via ORAL

## 2013-08-18 MED ORDER — OXYCODONE HCL 5 MG/5ML PO SOLN: 5.0000 mg | Freq: Once | ORAL | Status: AC | PRN

## 2013-08-18 MED ORDER — CHLORHEXIDINE GLUCONATE 4 % EX LIQD: 60.0000 mL | Freq: Once | CUTANEOUS | Status: DC

## 2013-08-18 MED ORDER — CLINDAMYCIN PHOSPHATE 900 MG/50ML IV SOLN
900.0000 mg | INTRAVENOUS | Status: AC
  Administered 2013-08-18: 900 mg via INTRAVENOUS
  Filled 2013-08-18: qty 50

## 2013-08-18 MED ORDER — HYDROMORPHONE HCL PF 1 MG/ML IJ SOLN
INTRAMUSCULAR | Status: AC
  Administered 2013-08-18: 0.5 mg via INTRAVENOUS
  Filled 2013-08-18: qty 1

## 2013-08-18 MED ORDER — PROMETHAZINE HCL 25 MG/ML IJ SOLN: 6.2500 mg | INTRAMUSCULAR | Status: DC | PRN

## 2013-08-18 MED ORDER — HYDROMORPHONE HCL PF 1 MG/ML IJ SOLN
1.0000 mg | INTRAMUSCULAR | Status: DC | PRN
  Administered 2013-08-18 (×4): 1 mg via INTRAVENOUS
  Filled 2013-08-18 (×5): qty 1

## 2013-08-18 MED ORDER — OXYCODONE-ACETAMINOPHEN 5-325 MG PO TABS
1.0000 | ORAL_TABLET | ORAL | Status: DC | PRN
  Administered 2013-08-19 – 2013-08-22 (×8): 2 via ORAL
  Filled 2013-08-18 (×8): qty 2

## 2013-08-18 MED ORDER — ARTIFICIAL TEARS OP OINT
TOPICAL_OINTMENT | OPHTHALMIC | Status: DC | PRN
  Administered 2013-08-18: 1 via OPHTHALMIC

## 2013-08-18 MED ORDER — HYDRALAZINE HCL 10 MG PO TABS
10.0000 mg | ORAL_TABLET | Freq: Four times a day (QID) | ORAL | Status: DC
  Administered 2013-08-18 – 2013-08-23 (×18): 10 mg via ORAL
  Filled 2013-08-18 (×23): qty 1

## 2013-08-18 MED ORDER — HYDROMORPHONE HCL PF 1 MG/ML IJ SOLN
0.2500 mg | INTRAMUSCULAR | Status: DC | PRN
  Administered 2013-08-18 (×4): 0.5 mg via INTRAVENOUS

## 2013-08-18 MED ORDER — SODIUM CHLORIDE 0.9 % IV SOLN: INTRAVENOUS | Status: DC

## 2013-08-18 MED ORDER — METOCLOPRAMIDE HCL 5 MG/ML IJ SOLN: 5.0000 mg | Freq: Three times a day (TID) | INTRAMUSCULAR | Status: DC | PRN

## 2013-08-18 MED ORDER — ONDANSETRON HCL 4 MG/2ML IJ SOLN: 4.0000 mg | Freq: Four times a day (QID) | INTRAMUSCULAR | Status: DC | PRN

## 2013-08-18 MED ORDER — OXYCODONE HCL 5 MG PO TABS
ORAL_TABLET | ORAL | Status: AC
  Administered 2013-08-18: 5 mg via ORAL
  Filled 2013-08-18: qty 1

## 2013-08-18 MED ORDER — METOCLOPRAMIDE HCL 10 MG PO TABS: 5.0000 mg | ORAL_TABLET | Freq: Three times a day (TID) | ORAL | Status: DC | PRN

## 2013-08-18 MED ORDER — FENTANYL CITRATE 0.05 MG/ML IJ SOLN
INTRAMUSCULAR | Status: DC | PRN
  Administered 2013-08-18: 25 ug via INTRAVENOUS
  Administered 2013-08-18: 50 ug via INTRAVENOUS
  Administered 2013-08-18 (×3): 25 ug via INTRAVENOUS

## 2013-08-18 MED ORDER — PROPOFOL 10 MG/ML IV BOLUS
INTRAVENOUS | Status: DC | PRN
  Administered 2013-08-18: 200 mg via INTRAVENOUS

## 2013-08-18 MED ORDER — ASPIRIN EC 325 MG PO TBEC
325.0000 mg | DELAYED_RELEASE_TABLET | Freq: Every day | ORAL | Status: DC
  Administered 2013-08-18 – 2013-08-23 (×6): 325 mg via ORAL
  Filled 2013-08-18 (×6): qty 1

## 2013-08-18 MED ORDER — SUCCINYLCHOLINE CHLORIDE 20 MG/ML IJ SOLN
INTRAMUSCULAR | Status: DC | PRN
  Administered 2013-08-18: 180 mg via INTRAVENOUS

## 2013-08-18 MED ORDER — ROCURONIUM BROMIDE 100 MG/10ML IV SOLN
INTRAVENOUS | Status: DC | PRN
  Administered 2013-08-18: 30 mg via INTRAVENOUS

## 2013-08-18 SURGICAL SUPPLY — 38 items
BANDAGE GAUZE ELAST BULKY 4 IN (GAUZE/BANDAGES/DRESSINGS) ×2 IMPLANT
BLADE GREAT WHITE 4.2 (BLADE) IMPLANT
BNDG COHESIVE 6X5 TAN STRL LF (GAUZE/BANDAGES/DRESSINGS) ×2 IMPLANT
BUR OVAL 6.0 (BURR) ×2 IMPLANT
CANNULA SHOULDER 7CM (CANNULA) ×2 IMPLANT
CLOTH BEACON ORANGE TIMEOUT ST (SAFETY) IMPLANT
COVER SURGICAL LIGHT HANDLE (MISCELLANEOUS) ×2 IMPLANT
DRAIN PENROSE 1/2X12 LTX STRL (WOUND CARE) ×2 IMPLANT
DRAPE INCISE IOBAN 66X45 STRL (DRAPES) ×2 IMPLANT
DRAPE STERI 35X30 U-POUCH (DRAPES) ×2 IMPLANT
DRAPE U-SHAPE 47X51 STRL (DRAPES) ×2 IMPLANT
DRSG ADAPTIC 3X8 NADH LF (GAUZE/BANDAGES/DRESSINGS) ×2 IMPLANT
DRSG EMULSION OIL 3X3 NADH (GAUZE/BANDAGES/DRESSINGS) ×2 IMPLANT
DRSG PAD ABDOMINAL 8X10 ST (GAUZE/BANDAGES/DRESSINGS) ×6 IMPLANT
DURAPREP 26ML APPLICATOR (WOUND CARE) ×2 IMPLANT
GLOVE BIOGEL PI IND STRL 9 (GLOVE) ×1 IMPLANT
GLOVE BIOGEL PI INDICATOR 9 (GLOVE) ×1
GLOVE SURG ORTHO 9.0 STRL STRW (GLOVE) ×4 IMPLANT
GOWN PREVENTION PLUS XLARGE (GOWN DISPOSABLE) ×2 IMPLANT
GOWN SRG XL XLNG 56XLVL 4 (GOWN DISPOSABLE) ×1 IMPLANT
GOWN STRL NON-REIN XL XLG LVL4 (GOWN DISPOSABLE) ×1
KIT BASIN OR (CUSTOM PROCEDURE TRAY) ×2 IMPLANT
KIT ROOM TURNOVER OR (KITS) ×2 IMPLANT
MANIFOLD NEPTUNE II (INSTRUMENTS) ×4 IMPLANT
NEEDLE SPNL 18GX3.5 QUINCKE PK (NEEDLE) ×2 IMPLANT
NS IRRIG 1000ML POUR BTL (IV SOLUTION) IMPLANT
PACK SHOULDER (CUSTOM PROCEDURE TRAY) ×2 IMPLANT
PAD ARMBOARD 7.5X6 YLW CONV (MISCELLANEOUS) ×4 IMPLANT
SET ARTHROSCOPY TUBING (MISCELLANEOUS) ×1
SET ARTHROSCOPY TUBING LN (MISCELLANEOUS) ×1 IMPLANT
SLING ARM IMMOBILIZER XL (CAST SUPPLIES) IMPLANT
SPONGE GAUZE 4X4 12PLY (GAUZE/BANDAGES/DRESSINGS) ×2 IMPLANT
SPONGE LAP 4X18 X RAY DECT (DISPOSABLE) ×6 IMPLANT
SUT ETHILON 2 0 FS 18 (SUTURE) ×2 IMPLANT
TAPE CLOTH SURG 6X10 WHT LF (GAUZE/BANDAGES/DRESSINGS) ×2 IMPLANT
TOWEL OR 17X24 6PK STRL BLUE (TOWEL DISPOSABLE) ×4 IMPLANT
WAND 90 DEG TURBOVAC W/CORD (SURGICAL WAND) ×2 IMPLANT
WATER STERILE IRR 1000ML POUR (IV SOLUTION) IMPLANT

## 2013-08-18 NOTE — Consult Note (Signed)
Reason for Consult:septic left shoulder Referring Physician: Dr Sheral Apley is an 37 y.o. male.  HPI: patient s/p MRI scan which shows possible septic left shoulder  Past Medical History  Diagnosis Date  . Shortness of breath     with exertion  . PONV (postoperative nausea and vomiting)   . Diabetes mellitus without complication   . Anxiety   . Mental disorder   . Depression   . Sleep apnea     done at [facility].  Marland Kitchen Pneumonia   . GERD (gastroesophageal reflux disease)   . Constipation   . Neuropathy     feet, hands  . Anginal pain     chest pressure has increased in the past few days; patient denied  on 09/20/12    Past Surgical History  Procedure Laterality Date  . I&d extremity  12/22/2011    Procedure: IRRIGATION AND DEBRIDEMENT EXTREMITY;  Surgeon: Kennieth Rad, MD;  Location: [facility] OR;  Service: Orthopedics;  Laterality: Left;  . I&d extremity  12/24/2011    Procedure: IRRIGATION AND DEBRIDEMENT EXTREMITY;  Surgeon: Kennieth Rad, MD;  Location: [facility] OR;  Service: Orthopedics;  Laterality: Left;  Irrigation and debridement left foot.  . I&d extremity  12/26/2011    Procedure: IRRIGATION AND DEBRIDEMENT EXTREMITY;  Surgeon: Kennieth Rad, MD;  Location: [facility] OR;  Service: Orthopedics;  Laterality: Left;  . I&d extremity  12/28/2011    Procedure: IRRIGATION AND DEBRIDEMENT EXTREMITY;  Surgeon: Kennieth Rad, MD;  Location: [facility] OR;  Service: Orthopedics;  Laterality: Left;  I&D Left Foot;Application of  Wound Vac.to dorsal and plantar areas of left foot  . Eye surgery    . Toe surgery      Bone removed- 2 toe right  . Wisdom tooth extraction    . Amputation  09/22/2012    Procedure: AMPUTATION FOOT;  Surgeon: Nadara Mustard, MD;  Location: [facility] OR;  Service: Orthopedics;  Laterality: Left;  Left Midfoot Amputation    History reviewed. No pertinent family history.  Social History:  reports that he has never smoked. He has never used smokeless tobacco. He reports that he  does not drink alcohol or use illicit drugs.  Allergies:  Allergies  Allergen Reactions  . Sulfonamide Derivatives Other (See Comments)    cramps    Medications: I have reviewed the patient's current medications.  Results for orders placed during the hospital encounter of 08/17/13 (from the past 48 hour(s))  GLUCOSE, CAPILLARY     Status: None   Collection Time    08/17/13  6:42 PM      Result Value Range   Glucose-Capillary 83  70 - 99 mg/dL  CBC WITH DIFFERENTIAL     Status: Abnormal   Collection Time    08/17/13  6:50 PM      Result Value Range   WBC 14.1 (*) 4.0 - 10.5 K/uL   RBC 2.75 (*) 4.22 - 5.81 MIL/uL   Hemoglobin 7.3 (*) 13.0 - 17.0 g/dL   HCT 08.6 (*) 57.8 - 46.9 %   MCV 84.7  78.0 - 100.0 fL   MCH 26.5  26.0 - 34.0 pg   MCHC 31.3  30.0 - 36.0 g/dL   RDW 62.9  52.8 - 41.3 %   Platelets 607 (*) 150 - 400 K/uL   Neutrophils Relative % 70  43 - 77 %   Neutro Abs 9.8 (*) 1.7 - 7.7 K/uL   Lymphocytes Relative 11 (*) 12 -  46 %   Lymphs Abs 1.6  0.7 - 4.0 K/uL   Monocytes Relative 11  3 - 12 %   Monocytes Absolute 1.6 (*) 0.1 - 1.0 K/uL   Eosinophils Relative 8 (*) 0 - 5 %   Eosinophils Absolute 1.1 (*) 0.0 - 0.7 K/uL   Basophils Relative 0  0 - 1 %   Basophils Absolute 0.0  0.0 - 0.1 K/uL  BASIC METABOLIC PANEL     Status: Abnormal   Collection Time    08/17/13  6:50 PM      Result Value Range   Sodium 135  135 - 145 mEq/L   Potassium 3.8  3.5 - 5.1 mEq/L   Chloride 106  96 - 112 mEq/L   CO2 21  19 - 32 mEq/L   Glucose, Bld 89  70 - 99 mg/dL   BUN 13  6 - 23 mg/dL   Creatinine, Ser 1.61  0.50 - 1.35 mg/dL   Calcium 8.3 (*) 8.4 - 10.5 mg/dL   GFR calc non Af Amer 78 (*) >90 mL/min   GFR calc Af Amer >90  >90 mL/min   Comment: (NOTE)     The eGFR has been calculated using the CKD EPI equation.     This calculation has not been validated in all clinical situations.     eGFR's persistently <90 mL/min signify possible Chronic Kidney     Disease.  GLUCOSE,  CAPILLARY     Status: None   Collection Time    08/17/13  7:54 PM      Result Value Range   Glucose-Capillary 85  70 - 99 mg/dL   Comment 1 Documented in Chart     Comment 2 Notify RN    GLUCOSE, CAPILLARY     Status: Abnormal   Collection Time    08/18/13 12:40 AM      Result Value Range   Glucose-Capillary 123 (*) 70 - 99 mg/dL  MRSA PCR SCREENING     Status: None   Collection Time    08/18/13  2:45 AM      Result Value Range   MRSA by PCR NEGATIVE  NEGATIVE   Comment:            The GeneXpert MRSA Assay (FDA     approved for NASAL specimens     only), is one component of a     comprehensive MRSA colonization     surveillance program. It is not     intended to diagnose MRSA     infection nor to guide or     monitor treatment for     MRSA infections.  CBC     Status: Abnormal   Collection Time    08/18/13  3:50 AM      Result Value Range   WBC 15.3 (*) 4.0 - 10.5 K/uL   RBC 2.60 (*) 4.22 - 5.81 MIL/uL   Hemoglobin 6.8 (*) 13.0 - 17.0 g/dL   Comment: REPEATED TO VERIFY     CRITICAL RESULT CALLED TO, READ BACK BY AND VERIFIED WITH:     VERGELDEDIOS,L/4W @0427  ON 08/18/13 BY KARCZEWSKI,S.   HCT 22.2 (*) 39.0 - 52.0 %   MCV 85.4  78.0 - 100.0 fL   MCH 26.2  26.0 - 34.0 pg   MCHC 30.6  30.0 - 36.0 g/dL   RDW 09.6  04.5 - 40.9 %   Platelets 598 (*) 150 - 400 K/uL  BASIC METABOLIC PANEL  Status: Abnormal   Collection Time    08/18/13  3:50 AM      Result Value Range   Sodium 136  135 - 145 mEq/L   Potassium 3.9  3.5 - 5.1 mEq/L   Chloride 106  96 - 112 mEq/L   CO2 20  19 - 32 mEq/L   Glucose, Bld 108 (*) 70 - 99 mg/dL   BUN 14  6 - 23 mg/dL   Creatinine, Ser 1.61  0.50 - 1.35 mg/dL   Calcium 8.2 (*) 8.4 - 10.5 mg/dL   GFR calc non Af Amer 70 (*) >90 mL/min   GFR calc Af Amer 82 (*) >90 mL/min   Comment: (NOTE)     The eGFR has been calculated using the CKD EPI equation.     This calculation has not been validated in all clinical situations.     eGFR's  persistently <90 mL/min signify possible Chronic Kidney     Disease.  GLUCOSE, CAPILLARY     Status: Abnormal   Collection Time    08/18/13  3:56 AM      Result Value Range   Glucose-Capillary 107 (*) 70 - 99 mg/dL  PREPARE RBC (CROSSMATCH)     Status: None   Collection Time    08/18/13  6:00 AM      Result Value Range   Order Confirmation ORDER PROCESSED BY BLOOD BANK    TYPE AND SCREEN     Status: None   Collection Time    08/18/13  7:10 AM      Result Value Range   ABO/RH(D) O POS     Antibody Screen NEG     Sample Expiration 08/21/2013     Unit Number [id number]     Blood Component Type RED CELLS,LR     Unit division 00     Status of Unit ALLOCATED     Transfusion Status OK TO TRANSFUSE     Crossmatch Result Compatible    ABO/RH     Status: None   Collection Time    08/18/13  7:10 AM      Result Value Range   ABO/RH(D) O POS    GLUCOSE, CAPILLARY     Status: None   Collection Time    08/18/13  7:42 AM      Result Value Range   Glucose-Capillary 95  70 - 99 mg/dL   Comment 1 Notify RN    GLUCOSE, CAPILLARY     Status: None   Collection Time    08/18/13 12:12 PM      Result Value Range   Glucose-Capillary 83  70 - 99 mg/dL   Comment 1 Notify RN      Mr Outside Films Lower Extremity  08/17/2013   This examination belongs to an outside facility and is stored  here for comparison purposes only.  Contact the originating outside  institution for any associated report or interpretation.   Review of Systems  All other systems reviewed and are negative.   Blood pressure 130/64, pulse 101, temperature 98.4 F (36.9 C), temperature source Oral, resp. rate 20, height 5\' 11"  (1.803 m), weight 186.8 kg (411 lb 13.1 oz), SpO2 100.00%. Physical Exam  Nursing note and vitals reviewed.  Pain with attempted ROM of shoulder, no cellulitis, MRI with effusion left glenohumeral joint Assessment/Plan: Septic left shoulder.  Plan for arthroscopic irrigation and debridement of  left shoulder  DUDA,MARCUS V 08/18/2013, 12:25 PM

## 2013-08-18 NOTE — Transfer of Care (Signed)
Immediate Anesthesia Transfer of Care Note  Patient: Adrian Khan  Procedure(s) Performed: Procedure(s): ARTHROSCOPY SHOULDER and subacromial decompresion and debreidment of abcess (Left)  Patient Location: PACU  Anesthesia Type:General  Level of Consciousness: awake and alert   Airway & Oxygen Therapy: Patient Spontanous Breathing and Patient connected to face mask  Post-op Assessment: Report given to PACU RN, Post -op Vital signs reviewed and stable and Patient moving all extremities  Post vital signs: Reviewed and stable  Complications: No apparent anesthesia complications

## 2013-08-18 NOTE — Progress Notes (Signed)
CRITICAL VALUE ALERT  Critical value received:  Hemoglobin 6.8  Date of notification:  08/18/2013  Time of notification:  0507  Critical value read back:yes  Nurse who received alert:  L. Vergel de Lucy Chris RN  MD notified (1st page):  Lenny Pastel NP  Time of first page:  [phone]  MD notified (2nd page):  Time of second page:  Responding MD:  Lenny Pastel NP  Time MD responded:  [phone]

## 2013-08-18 NOTE — Progress Notes (Signed)
[facility] PROGRESS NOTE  Adrian Khan ZOX:[id number] DOB: 07/26/1976 DOA: 08/17/2013 PCP: Jeri Cos, MD  Brief narrative: 37 year old male with past medical history of morbid obesity, uncontrolled diabetes who presented to [facility] ED with complaints of left arm pain for past 5-6 weeks prior to this admission. Pt was seen by Dr. Daiva Eves of ID 10/16 who reported suspicion for septic arthritis. MRI was performed as an outpatient 08/17/2013 with findings of large shoulder joint effusion, multiple fluid collections concerning for small abscesses around the joint capsule, and pyomyositis going down his biceps area. Orthopedic surgery assisting the management and plan is for arthroscopic washout today, 08/18/2013.   Assessment/Plan:  Principal Problem:   Septic arthritis of shoulder, left - appreciate ortho assistance - plan for arthroscopic washout and debridement 08/18/2013 - will continue antibiotics as initiated in ED, vanco and zosyn - OT evaluation prior to discharge - pain management with dilaudid 1 mg every 4 hours IV PRN and oxycontin 15 mg PO BID scheduled Active Problems:   DIABETES, TYPE 2, uncontrolled with complications of diabetic foot ulcer - uncontrolled, last A1c 07/25/2013 was 13.7 indicating poor glycemic control - appreciated diabetic coordinator consult - pt NPO for ortho procedure so use sliding scale insulin for now   Leukocytosis - secondary to septic shoulder joint - management as above   Anemia due to chronic illness - secondary to chronic diabetes; had anemia on 2013 with baseline Hgb 8 - 9   Morbid obesity, BMI over 41 - nutrition consulted   Diabetic foot ulcer - appreciate wound care consult and recomendations   HTN (hypertension) - added hydralazine 10 mg every 6 hours for better BP control   OHS/OSA - CPAP if needed at night  Code Status: full code Family Communication: patient's mother at bedside; all questions answered Disposition Plan:  transfer to [place] per ortho recommendations for further management  Manson Passey, MD  [facility] Pager [phone]  If 7PM-7AM, please contact night-coverage [email or web address] Password [email or web address] 08/18/2013, 7:09 AM   LOS: 1 day   Consultants:  Orthopedic surgery (Dr. Lajoyce Corners)  Procedures:  Arthroscopic washout and  debridement 08/18/2013  Antibiotics:  Vanco 08/17/2013 -->  Zosyn 08/17/2013 -->  HPI/Subjective: Pain in left shoulder.   Objective: Filed Vitals:   08/17/13 2022 08/17/13 2317 08/18/13 0000 08/18/13 0439  BP: 88/68 129/98 148/77 130/64  Pulse:  105 100 101  Temp:  98 F (36.7 C) 97.6 F (36.4 C) 98.4 F (36.9 C)  TempSrc:  Oral Oral Oral  Resp:  20 22 20   Height:   5\' 11"  (1.803 m)   Weight:   186.8 kg (411 lb 13.1 oz)   SpO2:  99% 100% 100%    Intake/Output Summary (Last 24 hours) at 08/18/13 8295 Last data filed at 08/18/13 0654  Gross per 24 hour  Intake     50 ml  Output      0 ml  Net     50 ml    Exam:   General:  Pt is alert, follows commands appropriately, not in acute distress, morbidly obese  Cardiovascular: Regular rate and rhythm, S1/S2, no murmurs, no rubs, no gallops  Respiratory: Clear to auscultation bilaterally, no wheezing, no crackles, no rhonchi  Abdomen: Soft, non tender, non distended, bowel sounds present, no guarding  Extremities: No edema, pulses DP and PT palpable bilaterally; left shoulder pain  Neuro: Grossly nonfocal  Data Reviewed: Basic Metabolic Panel:  Recent Labs Lab 08/17/13 1850 08/18/13 0350  NA 135 136  K 3.8 3.9  CL 106 106  CO2 21 20  GLUCOSE 89 108*  BUN 13 14  CREATININE 1.19 1.30  CALCIUM 8.3* 8.2*   Liver Function Tests: No results found for this basename: AST, ALT, ALKPHOS, BILITOT, PROT, ALBUMIN,  in the last 168 hours No results found for this basename: LIPASE, AMYLASE,  in the last 168 hours No results found for this basename: AMMONIA,  in the last 168 hours CBC:  Recent  Labs Lab 08/17/13 1850 08/18/13 0350  WBC 14.1* 15.3*  NEUTROABS 9.8*  --   HGB 7.3* 6.8*  HCT 23.3* 22.2*  MCV 84.7 85.4  PLT 607* 598*   Cardiac Enzymes: No results found for this basename: CKTOTAL, CKMB, CKMBINDEX, TROPONINI,  in the last 168 hours BNP: No components found with this basename: POCBNP,  CBG:  Recent Labs Lab 08/17/13 1842 08/17/13 1954 08/18/13 0040 08/18/13 0356  GLUCAP 83 85 123* 107*    MRSA PCR SCREENING     Status: None   Collection Time    08/18/13  2:45 AM      Result Value Range Status   MRSA by PCR NEGATIVE  NEGATIVE Final     Studies: Mr Outside Films Lower Extremity 08/17/2013   This examination belongs to an outside facility and is stored  here for comparison purposes only.  Contact the originating outside  institution for any associated report or interpretation.   Scheduled Meds: . insulin aspart  0-15 Units Subcutaneous Q4H  . OxyCODONE  15 mg Oral Q12H  . piperacillin-tazobactam  3.375 g Intravenous Q8H  . vancomycin  1,500 mg Intravenous Q12H

## 2013-08-18 NOTE — Progress Notes (Signed)
Patient had a large abscess distally on the arm just anterior to the antecubital fossa. A large Penrose drain was placed this did have a purulent abscess and this appears to be the major source of his infection. The shoulder joint was irrigated this did have a reactive effusion but did not have the purulent effusion. Cultures were obtained from both the joint and the arm.

## 2013-08-18 NOTE — Anesthesia Preprocedure Evaluation (Addendum)
Anesthesia Evaluation  Patient identified by MRN, date of birth, ID band Patient awake    Reviewed: Allergy & Precautions, H&P , NPO status , Patient's Chart, lab work & pertinent test results  History of Anesthesia Complications (+) PONV and history of anesthetic complications  Airway Mallampati: II TM Distance: >3 FB Neck ROM: Full    Dental  (+) Teeth Intact and Dental Advisory Given   Pulmonary shortness of breath and with exertion, sleep apnea , pneumonia -, resolved,  08-03-13 Chest x-ray FINDINGS: Cardiomegaly with left ventricular prominence. Pulmonary vascular congestion without overt edema. No large pleural effusion. No pneumothorax. No acute osseous abnormality.   IMPRESSION: Limited study secondary to patient body habitus and inability to obtain a lateral view.   Mild cardiomegaly with left ventricular prominence and pulmonary vascular congestion without overt edema.  Pt does not wear c-pap mask     Pulmonary exam normal       Cardiovascular Exercise Tolerance: Poor hypertension, + DOE Rhythm:Regular Rate:Normal  Echo 12/23/2011 Left ventricle: The cavity size was normal. Wall thickness was increased in a pattern of mild LVH. Systolic function was normal. The estimated ejection fraction was in the range of 60% to 65%.   EKG 10-14 NST   Neuro/Psych PSYCHIATRIC DISORDERS Anxiety Depression    GI/Hepatic Neg liver ROS, GERD-  Medicated,  Endo/Other  diabetes, Poorly Controlled, Type 2, Insulin DependentMorbid obesity  Renal/GU Renal InsufficiencyRenal disease     Musculoskeletal   Abdominal Normal abdominal exam  (+)   Peds  Hematology   Anesthesia Other Findings   Reproductive/Obstetrics                      Anesthesia Physical Anesthesia Plan  ASA: III  Anesthesia Plan: General   Post-op Pain Management:    Induction: Intravenous  Airway Management Planned: Oral  ETT  Additional Equipment:   Intra-op Plan:   Post-operative Plan: Extubation in OR  Informed Consent: I have reviewed the patients History and Physical, chart, labs and discussed the procedure including the risks, benefits and alternatives for the proposed anesthesia with the patient or authorized representative who has indicated his/her understanding and acceptance.   Dental advisory given  Plan Discussed with:   Anesthesia Plan Comments:         Anesthesia Quick Evaluation

## 2013-08-18 NOTE — Anesthesia Procedure Notes (Signed)
Procedure Name: Intubation Date/Time: 08/18/2013 2:19 PM Performed by: Tyrone Nine Pre-anesthesia Checklist: Patient identified, Timeout performed, Emergency Drugs available, Suction available and Patient being monitored Patient Re-evaluated:Patient Re-evaluated prior to inductionOxygen Delivery Method: Circle system utilized Preoxygenation: Pre-oxygenation with 100% oxygen Intubation Type: IV induction Ventilation: Mask ventilation without difficulty Laryngoscope Size: Mac and 3 Grade View: Grade I Tube type: Oral Tube size: 7.5 mm Number of attempts: 1 Airway Equipment and Method: Stylet Placement Confirmation: ETT inserted through vocal cords under direct vision,  breath sounds checked- equal and bilateral and positive ETCO2 Secured at: 23 cm Tube secured with: Tape Dental Injury: Teeth and Oropharynx as per pre-operative assessment

## 2013-08-18 NOTE — Progress Notes (Signed)
Patient has an order for one unit of PRBC but still not decided if he wants the blood to be transfused. He and his mother wished to talk to his doctor first before they can decide. Endorsed to RN this morning.

## 2013-08-18 NOTE — Preoperative (Signed)
Beta Blockers   Not applicable

## 2013-08-18 NOTE — Anesthesia Postprocedure Evaluation (Signed)
Anesthesia Post Note  Patient: Adrian Khan  Procedure(s) Performed: Procedure(s) (LRB): ARTHROSCOPY SHOULDER and subacromial decompresion and debreidment of abcess (Left)  Anesthesia type: general  Patient location: PACU  Post pain: Pain level controlled  Post assessment: Patient's Cardiovascular Status Stable  Last Vitals:  Filed Vitals:   08/18/13 1615  BP: 140/73  Pulse:   Temp: 36.7 C  Resp:     Post vital signs: Reviewed and stable  Level of consciousness: sedated  Complications: No apparent anesthesia complications

## 2013-08-18 NOTE — Progress Notes (Signed)
Patient arrived with blood transfusing. Anesthesia assumed care.

## 2013-08-18 NOTE — Progress Notes (Signed)
From  Sx /pacu  , VSS , alert and oriented , receiving unit of RBC's, Dsg  Left shoulder intact with good CMS ,  Oriented to room call  Bell within reach and his mother is at his bedside,

## 2013-08-18 NOTE — Consult Note (Addendum)
WOC wound consult note Reason for Consult:heel ulcers.I had seen this patient last month during previous admission. Wound type:Neuropathic ulcerations under the care of Dr. Jonathon Bellows.  Dr. Lajoyce Corners has seen the patient this evening and ordered wound care to the heel ulcers to beging tomorrow am ([place]).  I did not see this patient today. WOC nursing team will not follow, but will remain available to this patient, the nursing and medical team.  Please re-consult if needed. Thanks, Ladona Mow, MSN, RN, GNP, [place], CWON-AP [phone])

## 2013-08-18 NOTE — Op Note (Signed)
OPERATIVE REPORT  DATE OF SURGERY: 08/18/2013  PATIENT:  Adrian Khan,  37 y.o. male  PRE-OPERATIVE DIAGNOSIS:  abscess left shoulder  POST-OPERATIVE DIAGNOSIS:  abcess left shoulder and Abscess left arm  PROCEDURE:  Procedure(s): ARTHROSCOPY SHOULDER irrigation and debridement and subacromial decompresion  and debreidment of abcess left arm with placement of Penrose drain  SURGEON:  Surgeon(s): Nadara Mustard, MD  ANESTHESIA:   general  EBL:  Minimal ML  SPECIMEN:  Source of Specimen:  Cultures obtained from left shoulder and left arm abscess.  TOURNIQUET:  * No tourniquets in log *  PROCEDURE DETAILS: Patient is a 37 year old gentleman morbidly obese who is having pain in the left upper extremity. Patient is status post foot salvage surgery with partial foot amputation. Patient presents after MRI scan obtained in [facility] with MRI scan consistent with possible abscess of the left shoulder. Do to patient's persistent symptoms he presents at this time for surgical intervention after failure of prolonged conservative therapy including prolonged IV antibiotics. Risks and benefits were discussed including persistent infection neurovascular injury pain arthritis need for additional surgery. Patient and his mother state they understand and wish to proceed at this time. Description of procedure patient was brought to the operating room and underwent a general anesthetic. After adequate levels of anesthesia were obtained patient was placed in the beachchair position and the left upper extremity was prepped first using utilizing Betadine paint and scrub this was dried and then patient underwent prepping with DuraPrep. The left upper extremity was then draped into a sterile field. Patient first underwent arthroscopic debridement of the shoulder. An 18-gauge needle was inserted from the anterior portal into the joint there was some cloudy joint fluid which was drained from the joint this was  sent for cultures. The scope was inserted from the posterior portal the subacromial space and in the lateral portal was established the subacromial space was irrigated cleansed and debridement. The scope was attempted to be inserted the joint from the posterior portal and this was not possible the scope was then further inserted into the joint space from the lateral portal. Visualization showed no purulence the shaver was inserted from the anterior portal and the joint was debrided. Patient had some degenerative changes of the glenoid the humeral head was intact biceps tendon was intact intact the labrum was intact. The incisions were removed portals were closed using 2-0 nylon. Attention was then focused on the abscess of the distal aspect of his arm distally along the biceps. A 4 cm incision was made this was carried down to a deep prone abscess this appears to be more the source of his infection in the shoulder. This was irrigated with the normal saline from the IV bags from the arthroscopy set. The wound was irrigated cleansed the abscess was quite extensive but did not extend proximally up to the shoulder. A Penrose drain was placed in the incision was closed over the Penrose drain. Wounds were covered with Adaptic orthopedic sponges AB dressing Kerlix and Coban distally and Hypafix tape proximally. Patient was taken to the PACU after extubation in stable condition.  PLAN OF CARE: Admit to inpatient   PATIENT DISPOSITION:  PACU - hemodynamically stable.   Nadara Mustard, MD 08/18/2013 4:22 PM

## 2013-08-18 NOTE — Progress Notes (Signed)
*  PRELIMINARY RESULTS* Vascular Ultrasound Left upper extremity venous duplex has been completed.  Preliminary findings:  Very technically limited study due to patient body habitus. Poor visualization of veins.  No DVT noted in limited veins that were imaged.    Farrel Demark, RDMS, RVT  08/18/2013, 8:41 AM

## 2013-08-19 LAB — TYPE AND SCREEN: Unit division: 0

## 2013-08-19 LAB — GLUCOSE, CAPILLARY
Glucose-Capillary: 147 mg/dL — ABNORMAL HIGH (ref 70–99)
Glucose-Capillary: 142 mg/dL — ABNORMAL HIGH (ref 70–99)

## 2013-08-19 MED ORDER — DOCUSATE SODIUM 100 MG PO CAPS
100.0000 mg | ORAL_CAPSULE | Freq: Two times a day (BID) | ORAL | Status: DC
  Administered 2013-08-19 – 2013-08-20 (×2): 100 mg via ORAL
  Filled 2013-08-19 (×9): qty 1

## 2013-08-19 MED ORDER — SILVER SULFADIAZINE 1 % EX CREA
TOPICAL_CREAM | Freq: Every day | CUTANEOUS | Status: DC
  Administered 2013-08-19 – 2013-08-23 (×5): via TOPICAL
  Filled 2013-08-19: qty 85

## 2013-08-19 MED ORDER — MAGIC MOUTHWASH
15.0000 mL | Freq: Three times a day (TID) | ORAL | Status: AC
  Administered 2013-08-19 – 2013-08-21 (×3): 15 mL via ORAL
  Filled 2013-08-19 (×9): qty 15

## 2013-08-19 MED ORDER — SODIUM CHLORIDE 0.9 % IJ SOLN
10.0000 mL | INTRAMUSCULAR | Status: DC | PRN
  Administered 2013-08-19 – 2013-08-23 (×3): 10 mL

## 2013-08-19 MED ORDER — OXYCODONE HCL ER 10 MG PO T12A
30.0000 mg | EXTENDED_RELEASE_TABLET | Freq: Two times a day (BID) | ORAL | Status: DC
  Administered 2013-08-19 – 2013-08-23 (×8): 30 mg via ORAL
  Filled 2013-08-19 (×18): qty 1

## 2013-08-19 NOTE — Evaluation (Signed)
Occupational Therapy Evaluation Patient Details Name: Adrian Khan MRN: [id number] DOB: [birth year] Today's Date: 08/19/2013 Time: 7829-5621 OT Time Calculation (min): 72 min  OT Assessment / Plan / Recommendation History of present illness [name], Bil heel ulcers, [name], obesity   Clinical Impression   This 37 yo male admitted with above presents to acute OT with limited mobility due to pain and obesity thus also affecting his PLOF up until 4 weeks ago of Independent/Mod I with all BADLs and mobility. Will benefit from acute OT with follow up OT on CIR to get back to this level of function.    OT Assessment  Patient needs continued OT Services    Follow Up Recommendations  CIR       Equipment Recommendations  3 in 1 bedside comode (bariatric)       Frequency  Min 2X/week    Precautions / Restrictions Precautions Precautions: Fall;Shoulder Type of Shoulder Precautions: NWB'ing Restrictions Weight Bearing Restrictions: Yes LUE Weight Bearing: Non weight bearing   Pertinent Vitals/Pain 10/10 in left shoulder, bottom, and lower back with activity    ADL  Transfers/Ambulation Related to ADLs: Pt reported to me earlier when I tried to see him that he was not going to get up OOB today, he was just hurting too much. ADL Comments: When I removed the upper rail to have better access to his LUE I noted an open wound on his left elbow--called nursing in to look at. While waiting on nurse to come in room noted that his bedding up at his elbow area was wet looking like serous drainage and he had all the covers still under him from when they had moved him from the gurney to the bed in the OR and these were wet due to draining wounds on his back and bottom (once we rolled him over somewhat).  Pt at this point is setup for eating and grooming, total A for all other BADLs at bed level due to pain and obesity    OT Diagnosis: Generalized weakness;Acute pain  OT Problem List: Decreased strength;Decreased range of motion;Decreased activity tolerance;Impaired balance (sitting and/or standing);Pain;Impaired UE functional use;Obesity;Increased edema;Decreased knowledge of precautions OT Treatment Interventions: Self-care/ADL training;Balance training;Therapeutic activities;DME and/or AE instruction;Patient/family education   OT Goals(Current goals can be found in the care plan section) Acute Rehab OT Goals Patient Stated Goal: To back to his outdoor hobbies OT Goal Formulation: With patient Time For Goal Achievement: 09/02/13 Potential to Achieve Goals: Good  Visit Information  Last OT Received On: 08/19/13 Assistance Needed: +3 or more History of Present Illness: [name], Bil heel ulcers, [name], obesity       Prior Functioning     Home Living Family/patient expects to be discharged to:: Private residence Living Arrangements: Parent Available Help at Discharge: Family;Available 24 hours/day Type of Home: House Home Access: Stairs to enter [employer] of Steps: 3 Entrance Stairs-Rails: Can reach both (however not currently due to left shoulder pain) Home Layout: Two level;Able to live on main level with bedroom/bathroom Home Equipment: Dan Humphreys - 2 wheels;Wheelchair - power;Bedside commode;Hand held shower head Prior Function Level of Independence:  (Had been independent/Mod I up until  about 4 weeks ago) Communication Communication: No difficulties Dominant Hand: Right         Vision/Perception Vision - History Patient Visual Report: No change from baseline    Cognition  Cognition Arousal/Alertness: Awake/alert Behavior During Therapy: WFL for tasks assessed/performed Overall Cognitive Status: Within Functional Limits for tasks assessed    Extremity/Trunk Assessment Upper Extremity Assessment Upper Extremity Assessment: LUE deficits/detail LUE Deficits / Details: Edematous and lower arm hand in a dependent position. Pt with limited flexion and extension of digits due to edema and 5th digit does not want to extend with other digits. Limited flexion/extension of wrist due to edema and causing pain up in his shoulder per his report. Limited extension/flexion of elbow due to edema and causing pain in shoulder;  only movement he allowed me to do at his shoulder was due to his bandages being saturated above his elbow where the pin rose drain was and I assisted nursing with changing this out which had to involve moving his shoulder LUE Coordination: decreased fine motor;decreased gross motor     Mobility Bed Mobility Bed Mobility: Rolling Right;Rolling Left Rolling Right:  (total A +3 (one for LUE and other 2 to A with rolling)) Rolling Left:  (total A +3 (one for LUE and other 2 to A with rolling))           End of Session OT - End of Session Activity Tolerance: Patient limited by pain Patient left: in bed;with call bell/phone within reach;with nursing/sitter in room Nurse Communication:  (left elbow wound, and bed linens wet)    Evette Georges [phone] 08/19/2013, 12:58 PM

## 2013-08-19 NOTE — Progress Notes (Addendum)
Chart reviewed.  [facility] PROGRESS NOTE  Adrian Khan ZOX:[id number] DOB: 1976/10/02 DOA: 08/17/2013 PCP: Jeri Cos, MD  Brief narrative: 37 year old male with past medical history of morbid obesity, uncontrolled diabetes who presented to [facility] ED with complaints of left arm pain for past 5-6 weeks prior to this admission. Pt was seen by Dr. Daiva Eves of ID 10/16 who reported suspicion for septic arthritis. MRI was performed as an outpatient 08/17/2013 with findings of large shoulder joint effusion, multiple fluid collections concerning for small abscesses around the joint capsule, and pyomyositis going down his biceps area. Orthopedic surgery performed washout 08/18/2013.   Assessment/Plan:  Principal Problem:   Septic arthritis of shoulder, left S/P i&d Will consult ID Monday.  Cultures negative to date.  Continue vanc and zosyn for now.  Having much pain.  Will increase MS contin to 30 q12.      DIABETES, TYPE 2, uncontrolled with complications of diabetic foot ulcer CBGs ok currently    Anemia due to chronic illness - secondary to chronic diabetes; had anemia on 2013 with baseline Hgb 8 - 9    Morbid obesity, BMI over 41    Diabetic foot ulcer - appreciate wound care consult and recommendations    HTN (hypertension)    OHS/OSA - CPAPat night  Mouth pain: No thrush. Try Magic mouthwash.  Mid back with stage II pressure sore, according to patient's mother present prior to admission. Patient with multiple skin folds and at risk for worsening or new development of pressure sores. Will order an air mattress overlay and consult wound care for recommendations.  Code Status: full code Family Communication: patient's mother at bedside Disposition Plan: ? has refused  SNF in the past  Consultants:  Orthopedic surgery (Dr. Lajoyce Corners)  Procedures:  Arthroscopic washout and  debridement 08/18/2013  Antibiotics:  Vanco 08/17/2013 -->  Zosyn 08/17/2013  -->  HPI/Subjective:  complains of pain in the left shoulder. Mouth pain. Multiple complaints about having to reposition, etc  Objective: Filed Vitals:   08/19/13 0315 08/19/13 0415 08/19/13 0515 08/19/13 0530  BP: 139/76 145/81 147/92 149/84  Pulse: 99 96 96 99  Temp: 99.2 F (37.3 C) 99 F (37.2 C) 99.9 F (37.7 C) 99.7 F (37.6 C)  TempSrc: Oral Oral Oral Oral  Resp: 16 16  18   Height:      Weight:      SpO2: 100% 100% 100% 100%    Intake/Output Summary (Last 24 hours) at 08/19/13 1153 Last data filed at 08/19/13 0800  Gross per 24 hour  Intake 2777.5 ml  Output      0 ml  Net 2777.5 ml    Exam:   General:  Alert. Oriented. Sarcastic  Cardiovascular: Regular rate and rhythm, S1/S2, no murmurs, no rubs, no gallops  Respiratory: Clear to auscultation bilaterally, no wheezing, no crackles, no rhonchi  Abdomen: Soft, non tender, non distended, bowel sounds present, no guarding  Extremities: left shoulder and arm with dressing.   Skin: Multiple folds with bariatric cream. A stage II pressure sore mid back.   Data Reviewed: Basic Metabolic Panel:  Recent Labs Lab 08/17/13 1850 08/18/13 0350  NA 135 136  K 3.8 3.9  CL 106 106  CO2 21 20  GLUCOSE 89 108*  BUN 13 14  CREATININE 1.19 1.30  CALCIUM 8.3* 8.2*   Liver Function Tests: No results found for this basename: AST, ALT, ALKPHOS, BILITOT, PROT, ALBUMIN,  in the last 168 hours No results  found for this basename: LIPASE, AMYLASE,  in the last 168 hours No results found for this basename: AMMONIA,  in the last 168 hours CBC:  Recent Labs Lab 08/17/13 1850 08/18/13 0350  WBC 14.1* 15.3*  NEUTROABS 9.8*  --   HGB 7.3* 6.8*  HCT 23.3* 22.2*  MCV 84.7 85.4  PLT 607* 598*   Cardiac Enzymes: No results found for this basename: CKTOTAL, CKMB, CKMBINDEX, TROPONINI,  in the last 168 hours BNP: No components found with this basename: POCBNP,  CBG:  Recent Labs Lab 08/18/13 1608 08/18/13 2110  08/19/13 0015 08/19/13 0425 08/19/13 0633  GLUCAP 95 120* 120* 148* 130*    MRSA PCR SCREENING     Status: None   Collection Time    08/18/13  2:45 AM      Result Value Range Status   MRSA by PCR NEGATIVE  NEGATIVE Final     Studies: Mr Outside Films Lower Extremity 08/17/2013   This examination belongs to an outside facility and is stored  here for comparison purposes only.  Contact the originating outside  institution for any associated report or interpretation.   Scheduled Meds: . insulin aspart  0-15 Units Subcutaneous Q4H  . OxyCODONE  15 mg Oral Q12H  . piperacillin-tazobactam  3.375 g Intravenous Q8H  . vancomycin  1,500 mg Intravenous Q12H   Christiane Ha, MD  [facility] Pager [phone]  If 7PM-7AM, please contact night-coverage [email or web address] Password [email or web address] 08/19/2013, 11:53 AM   LOS: 2 days

## 2013-08-19 NOTE — Progress Notes (Signed)
PT Cancellation Note  Patient Details Name: Adrian Khan MRN: [id number] DOB: 16-Aug-1976   Cancelled Treatment:    Reason Eval/Treat Not Completed: Pain limiting ability to participate Pt adamantly refusing OOB today Will return tomorrow for PT eval   Van Clines [facility] 08/19/2013, 9:36 AM

## 2013-08-19 NOTE — Consult Note (Signed)
WOC wound consult note Reason for Consult: Area of skin breakdown (partial thickness) at mid-thoracic level secondary to moisture (intertriginous), unstageable pressure ulcer on left elbow Wound type:MASD (Moisture associated skin damage, specifically, ITD) and Pressure Pressure Ulcer POA: Yes (per mother and patient's report) Measurement:Mid-back areas of MASD:  4cm x 2cm x 0.2cm; left elbow:  1cm round.  Unable to determine depth due to presence of yellow slough. Wound bed:Mid back is clean, pink, moist (patient is diaphoretic).  Left elbow is as above Drainage (amount, consistency, odor) serous drainage from both sites Periwound:intact Dressing procedure/placement/frequency:I will implement a low air loss bariatric bed to assist with moisture management and a topical dressing consisting of a calcium alginate with will absorb exudate.  The left elbow will autolytically debride beneath a soft silicone dressing.  As the left shoulder heals, patient will have greater mobility and not have to push on left elbow. WOC nursing team will not follow, but will remain available to this patient, the nursing and medical team.  Please re-consult if needed. Thanks, Ladona Mow, MSN, RN, GNP, [place], [facility] [phone])

## 2013-08-19 NOTE — Progress Notes (Signed)
Patient's mother stated that the left elbow pressure ulcer was present prior to admission. Patient's mother also stated that the mid thoracic area was also present prior to admission. Wound nurse witnessed account and placed orders. Patient was ordered a bariatric air bed with trapeze. Patient also was ordered calcium aginate for the back as well as other items listed on orders. Patient was also ordered interdry for the folds of skin.

## 2013-08-19 NOTE — Progress Notes (Signed)
Patient ID: Adrian Khan, male   DOB: Mar 14, 1976, 37 y.o.   MRN: [id number] Patient has received a total of 3 units packed red blood cells. Cultures obtained intraoperatively x2 and culture results pending. Please see operative note and note yesterday postoperatively for operative findings. Start Silvadene dressing changes to both feet today. Start physical therapy and occupational therapy today.

## 2013-08-20 ENCOUNTER — Inpatient Hospital Stay: Payer: PRIVATE HEALTH INSURANCE | Admitting: Infectious Diseases

## 2013-08-20 DIAGNOSIS — IMO0002 Reserved for concepts with insufficient information to code with codable children: Secondary | ICD-10-CM

## 2013-08-20 DIAGNOSIS — R7881 Bacteremia: Secondary | ICD-10-CM

## 2013-08-20 DIAGNOSIS — B951 Streptococcus, group B, as the cause of diseases classified elsewhere: Secondary | ICD-10-CM

## 2013-08-20 DIAGNOSIS — E1129 Type 2 diabetes mellitus with other diabetic kidney complication: Secondary | ICD-10-CM

## 2013-08-20 DIAGNOSIS — M60009 Infective myositis, unspecified site: Secondary | ICD-10-CM

## 2013-08-20 DIAGNOSIS — N184 Chronic kidney disease, stage 4 (severe): Secondary | ICD-10-CM | POA: Diagnosis present

## 2013-08-20 LAB — TYPE AND SCREEN
Antibody Screen: NEGATIVE
Unit division: 0

## 2013-08-20 LAB — GLUCOSE, CAPILLARY
Glucose-Capillary: 152 mg/dL — ABNORMAL HIGH (ref 70–99)
Glucose-Capillary: 131 mg/dL — ABNORMAL HIGH (ref 70–99)
Glucose-Capillary: 150 mg/dL — ABNORMAL HIGH (ref 70–99)

## 2013-08-20 LAB — CBC WITH DIFFERENTIAL/PLATELET
Basophils Absolute: 0 10*3/uL (ref 0.0–0.1)
Basophils Relative: 0 % (ref 0–1)
Eosinophils Absolute: 1 10*3/uL — ABNORMAL HIGH (ref 0.0–0.7)
MCH: 27.4 pg (ref 26.0–34.0)
MCHC: 31.8 g/dL (ref 30.0–36.0)
Neutro Abs: 15.6 10*3/uL — ABNORMAL HIGH (ref 1.7–7.7)
Neutrophils Relative %: 76 % (ref 43–77)
Platelets: 503 10*3/uL — ABNORMAL HIGH (ref 150–400)
RDW: 14.4 % (ref 11.5–15.5)

## 2013-08-20 LAB — BASIC METABOLIC PANEL
Chloride: 107 mEq/L (ref 96–112)
Creatinine, Ser: 1.8 mg/dL — ABNORMAL HIGH (ref 0.50–1.35)
GFR calc Af Amer: 55 mL/min — ABNORMAL LOW (ref >90)
GFR calc non Af Amer: 47 mL/min — ABNORMAL LOW (ref >90)
Potassium: 4.2 mEq/L (ref 3.5–5.1)
Sodium: 137 mEq/L (ref 135–145)

## 2013-08-20 MED ORDER — WHITE PETROLATUM GEL
Status: AC
  Administered 2013-08-20: 21:00:00
  Filled 2013-08-20: qty 5

## 2013-08-20 MED ORDER — INSULIN ASPART 100 UNIT/ML ~~LOC~~ SOLN
0.0000 [IU] | Freq: Three times a day (TID) | SUBCUTANEOUS | Status: DC
  Administered 2013-08-20 – 2013-08-21 (×2): 2 [IU] via SUBCUTANEOUS
  Administered 2013-08-21: 3 [IU] via SUBCUTANEOUS
  Administered 2013-08-22 (×3): 2 [IU] via SUBCUTANEOUS

## 2013-08-20 MED ORDER — PRO-STAT SUGAR FREE PO LIQD
30.0000 mL | Freq: Two times a day (BID) | ORAL | Status: DC
  Filled 2013-08-20 (×12): qty 30

## 2013-08-20 MED ORDER — DEXTROSE 5 % IV SOLN
2.0000 g | INTRAVENOUS | Status: DC
  Administered 2013-08-20 – 2013-08-23 (×4): 2 g via INTRAVENOUS
  Filled 2013-08-20 (×4): qty 2

## 2013-08-20 NOTE — Evaluation (Signed)
Physical Therapy Evaluation Patient Details Name: Adrian Khan MRN: [id number] DOB: 1976-03-12 Today's Date: 08/20/2013 Time: 5621-3086 PT Time Calculation (min): 76 min  PT Assessment / Plan / Recommendation History of Present Illness  [name], Bil heel ulcers, [name], obesity  Clinical Impression  This patient presents with acute pain and decreased functional independence following the above mentioned procedure and multiple wounds. At the time of PT eval, pt required +4 assist with most functional mobility. Coordination of care with nursing is vital to schedule therapy with pain medication as well as to mobilize pt to change linens as wounds are continuously draining at this point. Pt to transfer to bariatric bed today, and mother to bring shoes to hospital so pt can stand. Pt very willing to work with therapy today but was limited mainly by pain of L shoulder and wounds on back and bottom.      PT Assessment  Patient needs continued PT services    Follow Up Recommendations  CIR    Does the patient have the potential to tolerate intense rehabilitation      Barriers to Discharge Inaccessible home environment      Equipment Recommendations  None recommended by PT    Recommendations for Other Services     Frequency Min 4X/week    Precautions / Restrictions Precautions Precautions: Fall Restrictions Weight Bearing Restrictions: No LUE Weight Bearing: Weight bearing as tolerated   Pertinent Vitals/Pain Pt received pain medication before and during therapy session but was still in significant pain due to the shearing forces of transfers on his skin.      Mobility  Bed Mobility Bed Mobility: Rolling Right;Rolling Left;Supine to Sit;Sitting - Scoot to [place] of Bed;Sit to  Supine;Scooting to [facility] Rolling Right:  (+3 total assist`) Rolling Right: Patient Percentage: 50% Rolling Left:  (+3 total assist) Rolling Left: Patient Percentage: 50% Supine to Sit: HOB elevated;With rails (+4 assist) Supine to Sit: Patient Percentage: 30% Sitting - Scoot to Edge of Bed:  (+4 assist) Sitting - Scoot to Edge of Bed: Patient Percentage: 30% Sit to Supine:  (+4 assist) Sit to Supine: Patient Percentage: 50% Scooting to HOB:  (+4 assist ) Scooting to [facility]: Patient Percentage: 30% Details for Bed Mobility Assistance: Frequent verbal cueing for sequencing. Open communication with pt as he was in significant pain throughout the transfer due to shearing forces on his skin.  Transfers Details for Transfer Assistance: Pt reports he cannot stand without his shoes on - mom to bring shoes later today Ambulation/Gait Ambulation/Gait Assistance: Not tested (comment) Wheelchair Mobility Wheelchair Mobility: No    Exercises Other Exercises Other Exercises: Minimal movement of arm allowed due to increase pain in arm and just from moving in general   PT Diagnosis: Difficulty walking;Acute pain  PT Problem List: Decreased strength;Decreased activity tolerance;Decreased range of motion;Decreased balance;Decreased mobility;Decreased knowledge of use of DME;Decreased safety awareness;Obesity;Pain;Decreased skin integrity PT Treatment Interventions: DME instruction;Gait training;Functional mobility training;Therapeutic activities;Therapeutic exercise;Neuromuscular re-education;Patient/family education     PT Goals(Current goals can be found in the care plan section) Acute Rehab PT Goals Patient Stated Goal: To get back to his outdoor hobbies PT Goal Formulation: With patient/family Time For Goal Achievement: 08/27/13 Potential to Achieve Goals: Fair  Visit Information  Last PT Received On: 08/20/13 Assistance Needed: +3 or more (+4 A) PT/OT Co-Evaluation/Treatment: Yes History  of  Present Illness: [name], Bil heel ulcers, [name], obesity       Prior Functioning  Home Living Family/patient expects to be discharged to:: Private residence Living Arrangements: Parent Available Help at Discharge: Family;Available 24 hours/day Type of Home: House Home Access: Stairs to enter [employer] of Steps: 3 Entrance Stairs-Rails: Can reach both Home Layout: Two level;Able to live on main level with bedroom/bathroom Home Equipment: Dan Humphreys - 2 wheels;Wheelchair - power;Bedside commode;Hand held shower head Prior Function Level of Independence: Independent Comments: Had been independent/modified independent until about 4 weeks ago. Communication Communication: No difficulties Dominant Hand: Right    Cognition  Cognition Arousal/Alertness: Awake/alert Behavior During Therapy: WFL for tasks assessed/performed Overall Cognitive Status: Within Functional Limits for tasks assessed    Extremity/Trunk Assessment Upper Extremity Assessment Upper Extremity Assessment: Defer to OT evaluation Lower Extremity Assessment Lower Extremity Assessment: RLE deficits/detail;LLE deficits/detail RLE Deficits / Details: Decreased strength overall and decreased AROM due to size. Pt can use feet to push on footboard for support during bed mobility. Pressure ulcer on heel. LLE Deficits / Details: Decreased AROM due to size and decreased strength. Pt with transmetatarsal amputation on L and pressure ulcer on heel.  Cervical / Trunk Assessment Cervical / Trunk Assessment: Normal   Balance Balance Balance Assessed: Yes Static Sitting Balance Static Sitting - Balance Support: Right upper extremity supported;Feet supported Static Sitting - Level of Assistance: 4: Min assist;5: Stand by  assistance Static Sitting - Comment/# of Minutes: Progressed to SBA after he could reach the footboard for support and could balance. 20 minutes sitting EOB with feet on floor.  End of Session PT - End of Session Equipment Utilized During Treatment: Oxygen (Occasionally) Activity Tolerance: Patient limited by pain Patient left: in bed;with call bell/phone within reach;with family/visitor present Nurse Communication: Mobility status  GP     Ruthann Cancer 08/20/2013, 1:16 PM  Ruthann Cancer, PT, DPT [phone]

## 2013-08-20 NOTE — Progress Notes (Signed)
08/20/13 PT/OT recommended inpatient rehab. CIR recommended SNF. Referral made to CSW. Will continue to follow for d/c needs. Jacquelynn Cree RN.BSN, CCM  08/18/2013 1000 Pt active with [facility] and has hosptial bed, RW, and wheelchair at home. Isidoro Donning RN CCM Case Mgmt phone [phone]

## 2013-08-20 NOTE — Progress Notes (Signed)
[facility]  Day #4 vancomycin and Zosyn  Subjective: No new complaints   Antibiotics:  Anti-infectives   Start     Dose/Rate Route Frequency Ordered Stop   08/20/13 1000  cefTRIAXone (ROCEPHIN) 2 g in dextrose 5 % 50 mL IVPB     2 g 100 mL/hr over 30 Minutes Intravenous Every 24 hours 08/20/13 0952     08/18/13 1400  clindamycin (CLEOCIN) IVPB 900 mg     900 mg 100 mL/hr over 30 Minutes Intravenous On call to O.R. 08/18/13 1352 08/18/13 1431   08/18/13 1000  vancomycin (VANCOCIN) 1,500 mg in sodium chloride 0.9 % 500 mL IVPB  Status:  Discontinued     1,500 mg 250 mL/hr over 120 Minutes Intravenous Every 12 hours 08/17/13 2029 08/20/13 0952   08/18/13 0600  piperacillin-tazobactam (ZOSYN) IVPB 3.375 g  Status:  Discontinued     3.375 g 12.5 mL/hr over 240 Minutes Intravenous Every 8 hours 08/17/13 2029 08/20/13 0952   08/17/13 2315  cefTRIAXone (ROCEPHIN) 2 g in dextrose 5 % 50 mL IVPB  Status:  Discontinued     2 g 100 mL/hr over 30 Minutes Intravenous Every 24 hours 08/17/13 2303 08/17/13 2304   08/17/13 2030  vancomycin (VANCOCIN) 2,500 mg in sodium chloride 0.9 % 500 mL IVPB     2,500 mg 250 mL/hr over 120 Minutes Intravenous STAT 08/17/13 2029 08/17/13 2345   08/17/13 2030  piperacillin-tazobactam (ZOSYN) IVPB 3.375 g     3.375 g 100 mL/hr over 30 Minutes Intravenous STAT 08/17/13 2029 08/17/13 2146      Medications: Scheduled Meds: . aspirin EC  325 mg Oral Daily  . cefTRIAXone (ROCEPHIN)  IV  2 g Intravenous Q24H  . docusate sodium  100 mg Oral BID  . feeding supplement (PRO-STAT SUGAR FREE 64)  30 mL Oral BID  . hydrALAZINE  10 mg Oral Q6H  . insulin aspart  0-15 Units Subcutaneous TID WC  . magic mouthwash  15 mL Oral TID  . OxyCODONE  30 mg Oral Q12H  . silver sulfADIAZINE   Topical Daily  . sodium chloride  3 mL Intravenous Q12H   Continuous Infusions: . sodium chloride     PRN Meds:.HYDROmorphone (DILAUDID) injection,  metoCLOPramide (REGLAN) injection, metoCLOPramide, ondansetron (ZOFRAN) IV, ondansetron, oxyCODONE-acetaminophen, senna-docusate, sodium chloride   Objective: Weight change:   Intake/Output Summary (Last 24 hours) at 08/20/13 1507 Last data filed at 08/20/13 0905  Gross per 24 hour  Intake   1763 ml  Output   1100 ml  Net    663 ml   Blood pressure 141/66, pulse 99, temperature 99.2 F (37.3 C), temperature source Oral, resp. rate 18, height 5\' 11"  (1.803 m), weight 411 lb 13.1 oz (186.8 kg), SpO2 99.00%. Temp:  [98 F (36.7 C)-99.2 F (37.3 C)] 99.2 F (37.3 C) (11/03 0447) Pulse Rate:  [99-107] 99 (11/03 0447) Resp:  [16-18] 18 (11/03 0447) BP: (141-157)/(65-82) 141/66 mmHg (11/03 1237) SpO2:  [99 %-100 %] 99 % (11/03 0447)  Physical Exam: General: Alert and awake, oriented x3, not in any acute distress. HEENT: anicteric sclera, pupils reactive to light and accommodation, EOMI CVS regular rate, normal r,  no murmur rubs or gallops Chest: clear to auscultation bilaterally, no wheezing, rales or rhonchi Abdomen: soft nontender, nondistended, normal bowel sounds, Extremities: left arm with dressing, drain, left foot ulcer with exudate not much changed since my last exam at [facility] Skin: no rashes Neuro: nonfocal  Lab Results:  Recent Labs  08/18/13 0350 08/20/13 0515  WBC 15.3* 20.5*  HGB 6.8* 8.4*  HCT 22.2* 26.4*  PLT 598* 503*    BMET  Recent Labs  08/18/13 0350 08/20/13 0515  NA 136 137  K 3.9 4.2  CL 106 107  CO2 20 20  GLUCOSE 108* 153*  BUN 14 19  CREATININE 1.30 1.80*  CALCIUM 8.2* 7.9*    Micro Results: Recent Results (from the past 240 hour(s))  MRSA PCR SCREENING     Status: None   Collection Time    08/18/13  2:45 AM      Result Value Range Status   MRSA by PCR NEGATIVE  NEGATIVE Final   Comment:            The GeneXpert MRSA Assay (FDA     approved for NASAL specimens     only), is one component of a     comprehensive MRSA  colonization     surveillance program. It is not     intended to diagnose MRSA     infection nor to guide or     monitor treatment for     MRSA infections.  CULTURE, BLOOD (ROUTINE X 2)     Status: None   Collection Time    08/18/13  3:50 AM      Result Value Range Status   Specimen Description BLOOD RIGHT HAND   Final   Special Requests BOTTLES DRAWN AEROBIC AND ANAEROBIC 10CC   Final   Culture  Setup Time     Final   Value: 08/18/2013 19:05     Performed at [facility]   Culture     Final   Value:        BLOOD CULTURE RECEIVED NO GROWTH TO DATE CULTURE WILL BE HELD FOR 5 DAYS BEFORE ISSUING A FINAL NEGATIVE REPORT     Performed at [facility]   Report Status PENDING   Incomplete  CULTURE, BLOOD (ROUTINE X 2)     Status: None   Collection Time    08/18/13  6:02 AM      Result Value Range Status   Specimen Description BLOOD RIGHT HAND   Final   Special Requests BOTTLES DRAWN AEROBIC ONLY 1 CC   Final   Culture  Setup Time     Final   Value: 08/18/2013 19:04     Performed at [facility]   Culture     Final   Value:        BLOOD CULTURE RECEIVED NO GROWTH TO DATE CULTURE WILL BE HELD FOR 5 DAYS BEFORE ISSUING A FINAL NEGATIVE REPORT     Performed at [facility]   Report Status PENDING   Incomplete  ANAEROBIC CULTURE     Status: None   Collection Time    08/18/13  3:13 PM      Result Value Range Status   Specimen Description ABSCESS LEFT SHOULDER   Final   Special Requests PATIENT ON FOLLOWING VANC ZOSYN CLINDAMYCIN   Final   Gram Stain     Final   Value: FEW WBC PRESENT,BOTH PMN AND MONONUCLEAR     NO SQUAMOUS EPITHELIAL CELLS SEEN     NO ORGANISMS SEEN     Performed at [facility]   Culture     Final   Value: NO ANAEROBES ISOLATED; CULTURE IN PROGRESS FOR 5 DAYS     Performed at [facility]  Report Status PENDING   Incomplete  CULTURE, ROUTINE-ABSCESS     Status: None   Collection Time    08/18/13  3:13 PM       Result Value Range Status   Specimen Description ABSCESS LEFT SHOULDER   Final   Special Requests PATIENT ON FOLLOWING VANC ZOSYN CLINDAMYCIN   Final   Gram Stain     Final   Value: FEW WBC PRESENT,BOTH PMN AND MONONUCLEAR     NO SQUAMOUS EPITHELIAL CELLS SEEN     NO ORGANISMS SEEN     Performed at [facility]   Culture     Final   Value: NO GROWTH 2 DAYS     Performed at [facility]   Report Status PENDING   Incomplete  CULTURE, ROUTINE-ABSCESS     Status: None   Collection Time    08/18/13  3:20 PM      Result Value Range Status   Specimen Description ABSCESS LEFT FOREARM   Final   Special Requests PATIENT ON FOLLOWING VANCOMYCIN ZOSYN CLINDAMYCIN   Final   Gram Stain     Final   Value: MODERATE WBC PRESENT,BOTH PMN AND MONONUCLEAR     NO SQUAMOUS EPITHELIAL CELLS SEEN     NO ORGANISMS SEEN     Performed at [facility]   Culture     Final   Value: NO GROWTH 2 DAYS     Performed at [facility]   Report Status PENDING   Incomplete  ANAEROBIC CULTURE     Status: None   Collection Time    08/18/13  3:20 PM      Result Value Range Status   Specimen Description ABSCESS LEFT FOREARM   Final   Special Requests PATIENT ON FOLLOWING VANCOMYCIN ZOSYN CLINDAMYCIN   Final   Gram Stain     Final   Value: FEW WBC PRESENT,BOTH PMN AND MONONUCLEAR     NO SQUAMOUS EPITHELIAL CELLS SEEN     NO ORGANISMS SEEN     Performed at [facility]   Culture     Final   Value: NO ANAEROBES ISOLATED; CULTURE IN PROGRESS FOR 5 DAYS     Performed at [facility]   Report Status PENDING   Incomplete    Studies/Results: No results found.    Assessment/Plan: Adrian Khan is a 37 y.o. 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  At the time attempts to aspirate the shoulder by IR were compromised by his size as were imaging such as MRI  and Orthopedics at that time were not convinced for septic arthritis. Ultimately pt was DC with IV rocephin and finally an MRI performed at [facility] that showed large shoulder effusion with multiple small abscesses and pymyositis along with abscess in biceps muscle. Pt was taken to the OR on Saturday by Dr. Lajoyce Corners and had I &D of the left shoulder and left arm. Cultures are without an organism  #1 Septic shoulder with pyomyositis also with arm abscess and Group B streptococcal bacteremia: Organism blood is undoubtedly same one that was responsible for his septic arthritis and pyomyositis. His antibiotics SHOULD have remained NARROW preoperatively to avoid further obfuscation. I am confident though that this is all Group B strep  ---narrow back to Rocephin 2 g IV daily and finish 6 weeks of postoperative abx  #2  Foot ulcer: certainly could have been portal, needs close monitoring along with other foot  I spent greater than 45 minutes with the patient including greater than 50% of time in face to face counsel of the patient and in coordination of their care.    LOS: 3 days   Acey Lav 08/20/2013, 3:07 PM

## 2013-08-20 NOTE — Progress Notes (Signed)
Rehab Admissions Coordinator Note:  Patient was screened by Trish Mage for appropriateness for an Inpatient Acute Rehab Consult. Patient has Management consultant.  Given current diagnosis, it is unlikely that [facility] would authorize an inpatient rehab admissions.   At this time, we are recommending Skilled Nursing Facility.  Call me for questions.  Trish Mage 08/20/2013, 8:27 AM  I can be reached at [phone].

## 2013-08-20 NOTE — Progress Notes (Signed)
08/20/13 Spoke with patient about d/c plans, I explained that inpatient rehab is recommending therapy at a SNF. Patient states that he will not go to a SNF. We discussed how he needs more than one person to assist with transfer and his mother is the only assist he has at home. I explained that we can do home health RN, PT,OT and aide but they will not be able to stay with him. I explained that there are agencies that provide aides to stay with patients but it would not be covered by insurance. He has a hospital bed, wheelchair and rolling walker at home.He wants to see how he does with therapy tomorrow before deciding about d/c plan. I will f/u with patient 08/21/13. Contacted [facility], informed Pam that currently patient wants to return home with HHC. Jacquelynn Cree RN, BSN, CCM

## 2013-08-20 NOTE — Progress Notes (Signed)
Occupational Therapy Treatment Patient Details Name: Adrian Khan MRN: [id number] DOB: 05/05/1976 Today's Date: 08/20/2013 Time: 4098-1191 OT Time Calculation (min): 73 min  OT Assessment / Plan / Recommendation  History of present illness [name], Bil heel ulcers, [name], obesity   OT comments  This 37 yo male with above presents to acute OT making progress in that he actually got up to the EOB with therapy today. He is still limited by pain, wounds on back side, and obesity. Will continue to benefit from acute OT with best option in patient rehab and next best option SNF.   Follow Up Recommendations  SNF       Equipment Recommendations  3 in 1 bedside comode (bariatric)       Frequency Min 2X/week   Progress towards OT Goals Progress towards OT goals: Progressing toward goals  Plan Discharge plan needs to be updated    Precautions / Restrictions Precautions Precautions: Fall Restrictions Weight Bearing Restrictions: No LUE Weight Bearing: Weight bearing as tolerated   Pertinent Vitals/Pain 10/10 back and buttocks with movement--burning; pre-medicated and repositioned (they are awaiting a new bed)    ADL  Transfers/Ambulation Related to ADLs: See mobility section      OT Goals(current goals can now be found in the care plan section)    Visit Information  Last OT Received On: 08/20/13 Assistance Needed:  (+4 A) PT/OT Co-Evaluation/Treatment: Yes History of Present Illness: [name], Bil heel ulcers, [name], obesity          Cognition  Cognition Arousal/Alertness:  Awake/alert Behavior During Therapy: WFL for tasks assessed/performed Overall Cognitive Status: Within Functional Limits for tasks assessed    Mobility  Bed Mobility Bed Mobility: Rolling Right;Rolling Left;Supine to Sit;Sitting - Scoot to [place] of Bed;Sit to Supine;Scooting to [facility] Rolling Right:  (+3) Rolling Right: Patient Percentage: 50% Rolling Left:  (+3) Rolling Left: Patient Percentage: 50% Supine to Sit: HOB elevated;With rails (+4) Supine to Sit: Patient Percentage: 30% Sitting - Scoot to Edge of Bed:  (+4) Sitting - Scoot to Edge of Bed: Patient Percentage: 30% Sit to Supine:  (+4) Sit to Supine: Patient Percentage: 50% Scooting to HOB:  (+4) Scooting to [facility]: Patient Percentage: 30% Transfers Details for Transfer Assistance: Pt reports he cannot stand without his shoes on    Exercises  Other Exercises Other Exercises: Minimal movement of arm allowed due to increase pain in arm and just from moving in general   Balance Balance Balance Assessed: Yes Static Sitting Balance Static Sitting - Balance Support: Right upper extremity supported;Feet supported Static Sitting - Level of Assistance:  (min guard A) Static Sitting - Comment/# of Minutes: 20 minutes with pt changing his RUE hand position to A him with holding himself up   End of Session OT - End of Session Activity Tolerance: Patient limited by pain (Did much more than yesterday but still limited due to pain/size) Patient left: in bed;with call bell/phone within reach;with family/visitor present Nurse Communication:  (Nurse in room A'ing [place] so he is aware of how pt moves)       Evette Georges [phone] 08/20/2013, 11:55 AM

## 2013-08-20 NOTE — Progress Notes (Addendum)
[facility] PROGRESS NOTE  Adrian Khan ZOX:[id number] DOB: [birth year] DOA: 08/17/2013 PCP: Jeri Cos, MD  Brief narrative: 37 year old male with past medical history of morbid obesity, uncontrolled diabetes who presented to [facility] ED with complaints of left arm pain for past 5-6 weeks prior to this admission. Pt was seen by Dr. Daiva Eves of ID 10/16 who reported suspicion for septic arthritis. MRI was performed as an outpatient 08/17/2013 with findings of large shoulder joint effusion, multiple fluid collections concerning for small abscesses around the joint capsule, and pyomyositis going down his biceps area. Orthopedic surgery performed washout 08/18/2013.   Assessment/Plan:  Principal Problem:   Septic arthritis of shoulder, left S/P i&d Consulted ID.  Cultures negative to date.  abx have been changed back to ceftriaxone.  Pain improved on higher oxycontin.      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 Try Magic mouthwash.  Mid back with stage II pressure sore, according to patient's mother present prior to admission. woc consulted. Awaiting mattress overlay  Code Status: full code Family Communication: patient's mother at bedside Disposition Plan: refusing SNF  Consultants:  Orthopedic surgery (Dr. Lajoyce Corners)  Procedures:  Arthroscopic washout and  debridement 08/18/2013  Antibiotics:  Vanco 08/17/2013 -->  Zosyn 08/17/2013 -->  HPI/Subjective: Shoulder pain better.  Does not want SNF  Objective: Filed Vitals:   08/19/13 2115 08/20/13 0053 08/20/13 0447 08/20/13 1237  BP: 149/73 157/82 152/75 141/66  Pulse: 103  99   Temp: 98.7 F (37.1 C)  99.2 F (37.3 C)   TempSrc:  Oral  Oral   Resp: 16  18   Height:      Weight:      SpO2: 100%  99%     Intake/Output Summary (Last 24 hours) at 08/20/13 1439 Last data filed at 08/20/13 0905  Gross per 24 hour  Intake   1763 ml  Output   1100 ml  Net    663 ml    Exam:   General:  Alert. Oriented. More pleasant today  Cardiovascular: Regular rate and rhythm, S1/S2, no murmurs, no rubs, no gallops  Respiratory: Clear to auscultation bilaterally, no wheezing, no crackles, no rhonchi  Abdomen: Soft, non tender, non distended, bowel sounds present, no guarding  Extremities: left shoulder and arm with dressing.   Data Reviewed: Basic Metabolic Panel:  Recent Labs Lab 08/17/13 1850 08/18/13 0350 08/20/13 0515  NA 135 136 137  K 3.8 3.9 4.2  CL 106 106 107  CO2 21 20 20   GLUCOSE 89 108* 153*  BUN 13 14 19   CREATININE 1.19 1.30 1.80*  CALCIUM 8.3* 8.2* 7.9*   Liver Function Tests: No results found for this basename: AST, ALT, ALKPHOS, BILITOT, PROT, ALBUMIN,  in the last 168 hours No results found for this basename: LIPASE, AMYLASE,  in the last 168 hours No results found for this basename: AMMONIA,  in the last 168 hours CBC:  Recent Labs Lab 08/17/13 1850 08/18/13 0350 08/20/13 0515  WBC 14.1* 15.3* 20.5*  NEUTROABS 9.8*  --  15.6*  HGB 7.3* 6.8* 8.4*  HCT 23.3* 22.2* 26.4*  MCV 84.7 85.4 86.0  PLT 607* 598*  503*   Cardiac Enzymes: No results found for this basename: CKTOTAL, CKMB, CKMBINDEX, TROPONINI,  in the last 168 hours BNP: No components found with this basename: POCBNP,  CBG:  Recent Labs Lab 08/19/13 2020 08/20/13 0035 08/20/13 0450 08/20/13 0821 08/20/13 1212  GLUCAP 142* 147* 152* 131* 118*    MRSA PCR SCREENING     Status: None   Collection Time    08/18/13  2:45 AM      Result Value Range Status   MRSA by PCR NEGATIVE  NEGATIVE Final     Studies: Mr Outside Films Lower Extremity 08/17/2013   This examination belongs to an outside facility and is  stored  here for comparison purposes only.  Contact the originating outside  institution for any associated report or interpretation.   Scheduled Meds: . insulin aspart  0-15 Units Subcutaneous Q4H  . OxyCODONE  15 mg Oral Q12H  . piperacillin-tazobactam  3.375 g Intravenous Q8H  . vancomycin  1,500 mg Intravenous Q12H   Christiane Ha, MD  [facility] Pager [phone]  If 7PM-7AM, please contact night-coverage [email or web address] Password [email or web address] 08/19/2013, 11:53 AM   LOS: 2 days

## 2013-08-20 NOTE — Progress Notes (Signed)
INITIAL NUTRITION ASSESSMENT  DOCUMENTATION CODES Per approved criteria  -Morbid Obesity   INTERVENTION:  1. 30 ml Prostat BID  2. Attempted education, will continue to follow up with pt to determine readiness for education.  NUTRITION DIAGNOSIS: 1. Food and Nutrition-Related Knowledge Deficit related to diabetic diet as evidenced by hgbA1C 13.7.   2. Increased protein needs related to wounds as evidenced by estimated needs.   Goal: Pt to meet >/= 90% of their estimated nutrition needs  Dietary compliance  Monitor:  PO intake, wounds, blood sugars  Reason for Assessment: MD consult for education  37 y.o. male  Admitting Dx: Septic arthritis of shoulder, left  ASSESSMENT: Pt admitted with left shoulder abscess. Pt is s/p I&D 11/1. Pt has been consuming 100% of his meals this admission.  Per pt he weighed 520 lb when he moved here in 1995 but he lost down to 382 by being very active. Pt's weight has trended up in the last year from 384 lb to 411 lb. Pt states that his weight gain has been from inactivity. Pt had part of his left foot amputated and has a non-healing wound so he has been staying off of it. Pt feels that every time he is admitted that everyone assumes that he is not following his diet but he does not feel that this is true. Pt spent a long time telling me all the foods that he does and does not eat. Pt does not cook and it does not appear that his mother cooks as he describes most meals are takeout.  Pt does not seem willing to discuss any changes as he feels that his diet is appropriate. Pt states that his blood sugars are fine however per reports pt is not checking his blood sugar. We discussed his last hgbA1C, but pt did not seem interested in this number. He does not feel that his diabetes contributes to his medical conditions.  Pt is not interested in education at this time, I did leave him some printed materials for his review, and I plan to follow up with him to  assess his readiness to learn.   Height: Ht Readings from Last 1 Encounters:  08/18/13 5\' 11"  (1.803 m)    Weight: Wt Readings from Last 1 Encounters:  08/18/13 411 lb 13.1 oz (186.8 kg)    Ideal Body Weight: 78.1 kg   % Ideal Body Weight: 239%  Wt Readings from Last 10 Encounters:  08/18/13 411 lb 13.1 oz (186.8 kg)  08/18/13 411 lb 13.1 oz (186.8 kg)  07/25/13 408 lb 8.2 oz (185.3 kg)  09/23/12 404 lb 5.2 oz (183.4 kg)  09/23/12 404 lb 5.2 oz (183.4 kg)  09/21/12 404 lb 4.8 oz (183.389 kg)  12/25/11 384 lb 14.8 oz (174.6 kg)  12/25/11 384 lb 14.8 oz (174.6 kg)  12/25/11 384 lb 14.8 oz (174.6 kg)  12/25/11 384 lb 14.8 oz (174.6 kg)    Usual Body Weight: 384 lb   % Usual Body Weight: 107%  BMI:  Body mass index is 57.46 kg/(m^2).  Estimated Nutritional Needs: Kcal: 2000-2200 Protein: 115-130 grams Fluid: > 2 L/day  Skin:  Pressure ulcer on back and elbow Incision left shoulder Diabetic foot ulcer left foot  Diet Order: Carb Control  EDUCATION NEEDS: -Education needs addressed   Intake/Output Summary (Last 24 hours) at 08/20/13 1441 Last data filed at 08/20/13 0905  Gross per 24 hour  Intake   1763 ml  Output   1100 ml  Net    663 ml    Last BM: PTA   Labs:   Recent Labs Lab 08/17/13 1850 08/18/13 0350 08/20/13 0515  NA 135 136 137  K 3.8 3.9 4.2  CL 106 106 107  CO2 21 20 20   BUN 13 14 19   CREATININE 1.19 1.30 1.80*  CALCIUM 8.3* 8.2* 7.9*  GLUCOSE 89 108* 153*    CBG (last 3)   Recent Labs  08/20/13 0450 08/20/13 0821 08/20/13 1212  GLUCAP 152* 131* 118*    Scheduled Meds: . aspirin EC  325 mg Oral Daily  . cefTRIAXone (ROCEPHIN)  IV  2 g Intravenous Q24H  . docusate sodium  100 mg Oral BID  . hydrALAZINE  10 mg Oral Q6H  . insulin aspart  0-15 Units Subcutaneous Q4H  . magic mouthwash  15 mL Oral TID  . OxyCODONE  30 mg Oral Q12H  . silver sulfADIAZINE   Topical Daily  . sodium chloride  3 mL Intravenous Q12H     Continuous Infusions: . sodium chloride      Past Medical History  Diagnosis Date  . Shortness of breath     with exertion  . PONV (postoperative nausea and vomiting)   . Diabetes mellitus without complication   . Anxiety   . Mental disorder   . Depression   . Sleep apnea     done at [facility].  Marland Kitchen Pneumonia   . GERD (gastroesophageal reflux disease)   . Constipation   . Neuropathy     feet, hands  . Anginal pain     chest pressure has increased in the past few days; patient denied  on 09/20/12    Past Surgical History  Procedure Laterality Date  . I&d extremity  12/22/2011    Procedure: IRRIGATION AND DEBRIDEMENT EXTREMITY;  Surgeon: Kennieth Rad, MD;  Location: [facility] OR;  Service: Orthopedics;  Laterality: Left;  . I&d extremity  12/24/2011    Procedure: IRRIGATION AND DEBRIDEMENT EXTREMITY;  Surgeon: Kennieth Rad, MD;  Location: [facility] OR;  Service: Orthopedics;  Laterality: Left;  Irrigation and debridement left foot.  . I&d extremity  12/26/2011    Procedure: IRRIGATION AND DEBRIDEMENT EXTREMITY;  Surgeon: Kennieth Rad, MD;  Location: [facility] OR;  Service: Orthopedics;  Laterality: Left;  . I&d extremity  12/28/2011    Procedure: IRRIGATION AND DEBRIDEMENT EXTREMITY;  Surgeon: Kennieth Rad, MD;  Location: [facility] OR;  Service: Orthopedics;  Laterality: Left;  I&D Left Foot;Application of  Wound Vac.to dorsal and plantar areas of left foot  . Eye surgery    . Toe surgery      Bone removed- 2 toe right  . Wisdom tooth extraction    . Amputation  09/22/2012    Procedure: AMPUTATION FOOT;  Surgeon: Nadara Mustard, MD;  Location: [facility] OR;  Service: Orthopedics;  Laterality: Left;  Left Midfoot Amputation    Kendell Bane RD, LDN, CNSC [phone] Pager [phone] After Hours Pager

## 2013-08-20 NOTE — Progress Notes (Signed)
[facility]  Patient Status: Active (receiving services up to time of hospitalization)  [facility] is providing the following services: RN, HHA and [facility] for home IV antibiotics.  [facility] team will follow and support transition back to home when deemed appropriate.   If patient discharges after hours, please call [phone].   Sedalia Muta 08/20/2013, 3:31 PM

## 2013-08-20 NOTE — Progress Notes (Signed)
Inpatient Diabetes Program Recommendations  AACE/ADA: New Consensus Statement on Inpatient Glycemic Control (2013)  Target Ranges:  Prepandial:   less than 140 mg/dL      Peak postprandial:   less than 180 mg/dL (1-2 hours)      Critically ill patients:  140 - 180 mg/dL   Reason for Visit: Results for Adrian Khan, Adrian Khan (MRN [id number]) as of 08/20/2013 14:06  Ref. Range 08/20/2013 00:35 08/20/2013 04:50 08/20/2013 05:15 08/20/2013 08:21 08/20/2013 12:12  Glucose-Capillary Latest Range: 70-99 mg/dL 782 (H) 956 (H)  213 (H) 118 (H)   Results for Adrian Khan, Adrian Khan (MRN [id number]) as of 08/20/2013 14:06  Ref. Range 07/25/2013 05:44  Hemoglobin A1C Latest Range: <5.7 % 13.7 (H)   Note A1C elevated in early October.  Mother states that patient was discharged home from hospital on Lantus insulin 22 units bid, however she states that they did not receive Rx. For glucose meter.  She states that he was taking insulin but not checking CBG's. Needs lots of reinforcement of education. Note that he is active with [facility] therefore may benefit from continued home health services with focus on diabetes after discharge from rehab/hospital.  Patient states that Dr. Bascom Levels has retired and he hopes that Dr. Synthia Innocent will be his PCP?? Consider changing Novolog correction to tid wc and HS scale.  Also will likely need to restart a portion of patient's home dose of basal insulin.  Consider Lantus 20 units daily while in the hospital.

## 2013-08-21 ENCOUNTER — Encounter (HOSPITAL_COMMUNITY): Payer: Self-pay | Admitting: Orthopedic Surgery

## 2013-08-21 LAB — GLUCOSE, CAPILLARY
Glucose-Capillary: 134 mg/dL — ABNORMAL HIGH (ref 70–99)
Glucose-Capillary: 160 mg/dL — ABNORMAL HIGH (ref 70–99)

## 2013-08-21 LAB — CULTURE, ROUTINE-ABSCESS
Culture: NO GROWTH
Culture: NO GROWTH

## 2013-08-21 NOTE — Progress Notes (Signed)
Patient refusing to turn and allow staff to clean stool off of buttocks. Spoke with patient about importance of turning and cleaning up because of high risk of skin breakdown and pressure ulcer occurrence. Patient agreed and was moved to to a bariatric overlay mattress bed today at 1030. 3 RNs, 3 NTs, OT, PT and a Physical therapy tech assisted in transfer to bed. Patient was transferred safely to bed. Once on overlay mattress patient's back and buttocks were cleansed and barrier cream applied to red excoriated areas on buttocks. Multiple mepilex dressings were applied to open areas located on right and left butt cheeks. Moisture barrier dressings were placed in folds of skin. Patient very emotional during moving and cleaning process but stated understanding of importance of moving to bariatric overlay mattress bed. Emotional support given to patient.

## 2013-08-21 NOTE — Progress Notes (Signed)
Mr. Goynes MRI was completed on 10/31 at   [facility],  [id number] [address] [place] [address] tax ID number [id number].    I have called  [employer] to appeal this claim so that it may be paid.  Mr. Conger member ID [id number] [facility] Case Number: [id number].    Was transferred to [facility] [id number]  Options 1,1,1,3.  Other professional services, more options, appeals submission.  Was told to fill out online forms - [email or web address]  "claim reconsideration"  Was asked to create a login and ID - was asked for tax id number.  Hung up and called [facility] back and gave them this information. Spoke with Tammy who is the Supervisor of the [employer].  They will continue to pursue this claim.     Algis Downs, PA-C [facility] Pager: [phone]

## 2013-08-21 NOTE — Progress Notes (Signed)
[facility]  Day #2 rocephin restarted  Subjective: Pt crying asked to be left alone for moment   Antibiotics:  Anti-infectives   Start     Dose/Rate Route Frequency Ordered Stop   08/20/13 1000  cefTRIAXone (ROCEPHIN) 2 g in dextrose 5 % 50 mL IVPB     2 g 100 mL/hr over 30 Minutes Intravenous Every 24 hours 08/20/13 0952     08/18/13 1400  clindamycin (CLEOCIN) IVPB 900 mg     900 mg 100 mL/hr over 30 Minutes Intravenous On call to O.R. 08/18/13 1352 08/18/13 1431   08/18/13 1000  vancomycin (VANCOCIN) 1,500 mg in sodium chloride 0.9 % 500 mL IVPB  Status:  Discontinued     1,500 mg 250 mL/hr over 120 Minutes Intravenous Every 12 hours 08/17/13 2029 08/20/13 0952   08/18/13 0600  piperacillin-tazobactam (ZOSYN) IVPB 3.375 g  Status:  Discontinued     3.375 g 12.5 mL/hr over 240 Minutes Intravenous Every 8 hours 08/17/13 2029 08/20/13 0952   08/17/13 2315  cefTRIAXone (ROCEPHIN) 2 g in dextrose 5 % 50 mL IVPB  Status:  Discontinued     2 g 100 mL/hr over 30 Minutes Intravenous Every 24 hours 08/17/13 2303 08/17/13 2304   08/17/13 2030  vancomycin (VANCOCIN) 2,500 mg in sodium chloride 0.9 % 500 mL IVPB     2,500 mg 250 mL/hr over 120 Minutes Intravenous STAT 08/17/13 2029 08/17/13 2345   08/17/13 2030  piperacillin-tazobactam (ZOSYN) IVPB 3.375 g     3.375 g 100 mL/hr over 30 Minutes Intravenous STAT 08/17/13 2029 08/17/13 2146      Medications: Scheduled Meds: . aspirin EC  325 mg Oral Daily  . cefTRIAXone (ROCEPHIN)  IV  2 g Intravenous Q24H  . docusate sodium  100 mg Oral BID  . feeding supplement (PRO-STAT SUGAR FREE 64)  30 mL Oral BID  . hydrALAZINE  10 mg Oral Q6H  . insulin aspart  0-15 Units Subcutaneous TID WC  . magic mouthwash  15 mL Oral TID  . OxyCODONE  30 mg Oral Q12H  . silver sulfADIAZINE   Topical Daily  . sodium chloride  3 mL Intravenous Q12H   Continuous Infusions: . sodium chloride     PRN Meds:.HYDROmorphone  (DILAUDID) injection, metoCLOPramide (REGLAN) injection, metoCLOPramide, ondansetron (ZOFRAN) IV, ondansetron, oxyCODONE-acetaminophen, senna-docusate, sodium chloride   Objective: Weight change:   Intake/Output Summary (Last 24 hours) at 08/21/13 1329 Last data filed at 08/21/13 1244  Gross per 24 hour  Intake    240 ml  Output   2150 ml  Net  -1910 ml   Blood pressure 150/83, pulse 96, temperature 98.7 F (37.1 C), temperature source Oral, resp. rate 16, height 5\' 11"  (1.803 m), weight 411 lb 13.1 oz (186.8 kg), SpO2 99.00%. Temp:  [98.6 F (37 C)-98.8 F (37.1 C)] 98.7 F (37.1 C) (11/04 0511) Pulse Rate:  [96-109] 96 (11/04 0511) Resp:  [16-18] 16 (11/04 0511) BP: (149-156)/(67-85) 150/83 mmHg (11/04 0511) SpO2:  [98 %-99 %] 99 % (11/04 0511)  Physical Exam: General: pt crying had asked to be left alone  Lab Results:  Recent Labs  08/20/13 0515  WBC 20.5*  HGB 8.4*  HCT 26.4*  PLT 503*    BMET  Recent Labs  08/20/13 0515  NA 137  K 4.2  CL 107  CO2 20  GLUCOSE 153*  BUN 19  CREATININE 1.80*  CALCIUM 7.9*    Micro Results: Recent Results (  from the past 240 hour(s))  MRSA PCR SCREENING     Status: None   Collection Time    08/18/13  2:45 AM      Result Value Range Status   MRSA by PCR NEGATIVE  NEGATIVE Final   Comment:            The GeneXpert MRSA Assay (FDA     approved for NASAL specimens     only), is one component of a     comprehensive MRSA colonization     surveillance program. It is not     intended to diagnose MRSA     infection nor to guide or     monitor treatment for     MRSA infections.  CULTURE, BLOOD (ROUTINE X 2)     Status: None   Collection Time    08/18/13  3:50 AM      Result Value Range Status   Specimen Description BLOOD RIGHT HAND   Final   Special Requests BOTTLES DRAWN AEROBIC AND ANAEROBIC 10CC   Final   Culture  Setup Time     Final   Value: 08/18/2013 19:05     Performed at [facility]   Culture      Final   Value:        BLOOD CULTURE RECEIVED NO GROWTH TO DATE CULTURE WILL BE HELD FOR 5 DAYS BEFORE ISSUING A FINAL NEGATIVE REPORT     Performed at [facility]   Report Status PENDING   Incomplete  CULTURE, BLOOD (ROUTINE X 2)     Status: None   Collection Time    08/18/13  6:02 AM      Result Value Range Status   Specimen Description BLOOD RIGHT HAND   Final   Special Requests BOTTLES DRAWN AEROBIC ONLY 1 CC   Final   Culture  Setup Time     Final   Value: 08/18/2013 19:04     Performed at [facility]   Culture     Final   Value:        BLOOD CULTURE RECEIVED NO GROWTH TO DATE CULTURE WILL BE HELD FOR 5 DAYS BEFORE ISSUING A FINAL NEGATIVE REPORT     Performed at [facility]   Report Status PENDING   Incomplete  ANAEROBIC CULTURE     Status: None   Collection Time    08/18/13  3:13 PM      Result Value Range Status   Specimen Description ABSCESS LEFT SHOULDER   Final   Special Requests PATIENT ON FOLLOWING VANC ZOSYN CLINDAMYCIN   Final   Gram Stain     Final   Value: FEW WBC PRESENT,BOTH PMN AND MONONUCLEAR     NO SQUAMOUS EPITHELIAL CELLS SEEN     NO ORGANISMS SEEN     Performed at [facility]   Culture     Final   Value: NO ANAEROBES ISOLATED; CULTURE IN PROGRESS FOR 5 DAYS     Performed at [facility]   Report Status PENDING   Incomplete  CULTURE, ROUTINE-ABSCESS     Status: None   Collection Time    08/18/13  3:13 PM      Result Value Range Status   Specimen Description ABSCESS LEFT SHOULDER   Final   Special Requests PATIENT ON FOLLOWING VANC ZOSYN CLINDAMYCIN   Final   Gram Stain     Final   Value: FEW WBC PRESENT,BOTH PMN AND MONONUCLEAR  NO SQUAMOUS EPITHELIAL CELLS SEEN     NO ORGANISMS SEEN     Performed at [facility]   Culture     Final   Value: NO GROWTH 3 DAYS     Performed at [facility]   Report Status 08/21/2013 FINAL   Final  CULTURE, ROUTINE-ABSCESS     Status: None   Collection  Time    08/18/13  3:20 PM      Result Value Range Status   Specimen Description ABSCESS LEFT FOREARM   Final   Special Requests PATIENT ON FOLLOWING VANCOMYCIN ZOSYN CLINDAMYCIN   Final   Gram Stain     Final   Value: MODERATE WBC PRESENT,BOTH PMN AND MONONUCLEAR     NO SQUAMOUS EPITHELIAL CELLS SEEN     NO ORGANISMS SEEN     Performed at [facility]   Culture     Final   Value: NO GROWTH 3 DAYS     Performed at [facility]   Report Status 08/21/2013 FINAL   Final  ANAEROBIC CULTURE     Status: None   Collection Time    08/18/13  3:20 PM      Result Value Range Status   Specimen Description ABSCESS LEFT FOREARM   Final   Special Requests PATIENT ON FOLLOWING VANCOMYCIN ZOSYN CLINDAMYCIN   Final   Gram Stain     Final   Value: FEW WBC PRESENT,BOTH PMN AND MONONUCLEAR     NO SQUAMOUS EPITHELIAL CELLS SEEN     NO ORGANISMS SEEN     Performed at [facility]   Culture     Final   Value: NO ANAEROBES ISOLATED; CULTURE IN PROGRESS FOR 5 DAYS     Performed at [facility]   Report Status PENDING   Incomplete    Studies/Results: No results found.    Assessment/Plan: Adrian Khan is a 37 y.o. male whom I followed at [facility] 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  At the time attempts to aspirate the shoulder by IR were compromised by his size as were imaging such as MRI and Orthopedics at that time were not convinced for septic arthritis. Ultimately pt was DC with IV rocephin and finally an MRI performed at [facility] that showed large shoulder effusion with multiple small abscesses and pymyositis along with abscess in biceps muscle. Pt was taken to the OR on Saturday by Dr. Lajoyce Corners and had I &D of the left shoulder and left arm. Cultures are without an organism  #1 Septic shoulder with pyomyositis also with arm abscess and Group B streptococcal bacteremia: Organism blood is  undoubtedly same one that was responsible for his septic arthritis and pyomyositis. His antibiotics SHOULD have remained NARROW preoperatively to avoid further obfuscation. I am confident though that this is all Group B strep  ---continue Rocephin 2 g IV daily and finish SIX WEEKS  of postoperative abx,   #2 Foot ulcer: certainly could have been portal, needs close monitoring along with other foot  I will visit with pt tomorrow again and then set up HSFU with [place] again   LOS: 4 days   Acey Lav 08/21/2013, 1:29 PM

## 2013-08-21 NOTE — Progress Notes (Signed)
Physical Therapy Treatment Patient Details Name: Adrian Khan MRN: [id number] DOB: [birth year] Today's Date: 08/21/2013 Time: 4098-1191 PT Time Calculation (min): 56 min  PT Assessment / Plan / Recommendation  History of Present Illness [name], Bil heel ulcers, [name], [name], the pt was in severe pain due to the wounds on his back side, [name], [name];Rolling Left;[name], [name], pt was unwilling to assist much with transfer but was able to make small movements towards the edge of the bed to set up for transfer. Ambulation/Gait Ambulation/Gait Assistance: Not tested (comment) Wheelchair Mobility Wheelchair Mobility: No    Exercises Other Exercises Other Exercises: Pt allowed small AAROM of L elbow (x5 reps) before pain increased in L shoulder. He was able to perform opening and closing of fist x5 reps and reports he is unable to make as tight a fist as before he was admitted.   PT Diagnosis:    PT Problem List:   PT Treatment Interventions:     PT Goals (current goals can now be found in the care plan section) Acute Rehab PT Goals Patient Stated Goal: To get back to his outdoor hobbies PT Goal Formulation: With patient/family Time For Goal Achievement:  08/27/13 Potential to Achieve Goals: Fair  Visit Information  Last PT Received On: 08/21/13 Assistance Needed: +3 or more (+4 A) History of Present Illness: ARTHROSCOPY SHOULDER irrigation and debridement and subacromial decompresion and debridement of abcess left  arm with placement of Penrose drain, Bil heel ulcers, [name], [name];with nursing/sitter in room Nurse Communication: Other (comment) (Nursing present in room throughout session)   GP     Adrian Khan 08/21/2013, 12:24 PM  Adrian Khan, PT, [name]

## 2013-08-21 NOTE — Progress Notes (Signed)
Patient ID: Adrian Khan, male   DOB: 1976/05/07, 37 y.o.   MRN: [id number] Dressings to be changed to the left shoulder and left arm today with a Penrose drain removed I nursing today. Dry dressing changes to the left arm daily.  Please provide Silvadene dressing changes and wound cleansing daily to both feet as per orders.  Patient did have a purulent abscess in his left arm with purulent drainage from within the shoulder as well. Recommend continue IV antibiotics for one week postoperatively and then discharged on oral antibiotics for 1 month.

## 2013-08-21 NOTE — Progress Notes (Signed)
[facility] PROGRESS NOTE  Adrian Khan:[id number] DOB: 1976-01-17 DOA: 08/17/2013 PCP: Laurena Slimmer, MD  Brief narrative: 37 year old male with past medical history of morbid obesity, uncontrolled diabetes who presented to [facility] ED with complaints of left arm pain for past 5-6 weeks prior to this admission. Pt was seen by Dr. Daiva Eves of ID 10/16 who reported suspicion for septic arthritis. MRI was performed as an outpatient 08/17/2013 with findings of large shoulder joint effusion, multiple fluid collections concerning for small abscesses around the joint capsule, and pyomyositis going down his biceps area. Orthopedic surgery performed washout 08/18/2013.   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 Lajoyce Corners)  Procedures:  Arthroscopic washout and  debridement 08/18/2013  Antibiotics:  Vanco 08/17/2013 -->11/3  Zosyn 08/17/2013 -->11/3  Rocephin 11/3  HPI/Subjective: Shoulder pain better.  Does not want SNF  Objective: Filed Vitals:   08/20/13 1757 08/20/13 2033 08/21/13 0511 08/21/13 1427  BP: 149/67 151/70 150/83 145/67  Pulse:  107 96 104  Temp:  98.6 F (37 C) 98.7 F (37.1 C) 97.7 F (36.5 C)  TempSrc:      Resp:  16 16 20   Height:      Weight:      SpO2:  99% 99% 100%    Intake/Output Summary (Last 24 hours) at 08/21/13 1611 Last data filed at 08/21/13 1300  Gross per 24 hour   Intake    480 ml  Output   1400 ml  Net   -920 ml    Exam:   General:  Groggy.  Cardiovascular: Regular rate and rhythm, S1/S2, no murmurs, no rubs, no gallops  Respiratory: Clear to auscultation bilaterally, no wheezing, no crackles, no rhonchi  Abdomen: Soft, non tender, non distended, bowel sounds present, no guarding  Extremities: left shoulder and arm with dressing.   Data Reviewed: Basic Metabolic Panel:  Recent Labs Lab 08/17/13 1850 08/18/13 0350 08/20/13 0515  NA 135 136 137  K 3.8 3.9 4.2  CL 106 106 107  CO2 21 20 20   GLUCOSE 89 108* 153*  BUN 13 14 19   CREATININE 1.19 1.30 1.80*  CALCIUM 8.3* 8.2* 7.9*   Liver Function Tests: No results found for this basename: AST, ALT, ALKPHOS, BILITOT, PROT, ALBUMIN,  in the last 168 hours No results found for this basename: LIPASE, AMYLASE,  in the last 168 hours No results found for this basename: AMMONIA,  in the last 168 hours CBC:  Recent Labs Lab 08/17/13 1850 08/18/13 0350 08/20/13 0515  WBC 14.1* 15.3* 20.5*  NEUTROABS 9.8*  --  15.6*  HGB 7.3* 6.8* 8.4*  HCT 23.3* 22.2* 26.4*  MCV 84.7 85.4 86.0  PLT 607* 598* 503*   Cardiac Enzymes: No results found for this basename: CKTOTAL, CKMB, CKMBINDEX, TROPONINI,  in the last 168 hours BNP: No components found with this basename: POCBNP,  CBG:  Recent Labs Lab  08/20/13 1212 08/20/13 1620 08/20/13 2130 08/21/13 0648 08/21/13 1123  GLUCAP 118* 131* 150* 118* 134*    MRSA PCR SCREENING     Status: None   Collection Time    08/18/13  2:45 AM      Result Value Range Status   MRSA by PCR NEGATIVE  NEGATIVE Final     Studies: Mr Outside Films Lower Extremity 08/17/2013   This examination belongs to an outside facility and is stored  here for comparison purposes only.  Contact the originating outside  institution for any associated report or interpretation.   Scheduled Meds: . insulin aspart  0-15 Units Subcutaneous Q4H  . OxyCODONE  15 mg  Oral Q12H  . piperacillin-tazobactam  3.375 g Intravenous Q8H  . vancomycin  1,500 mg Intravenous Q12H   Christiane Ha, MD  [facility] Pager [phone]  If 7PM-7AM, please contact night-coverage [email or web address] Password [facility] 08/19/2013, 11:53 AM

## 2013-08-21 NOTE — Progress Notes (Signed)
Dressing changes was done to rle, lle, left shoulder, however patient refused left arm for dressing change.  He did not want me to move his left arm.

## 2013-08-21 NOTE — Progress Notes (Signed)
During the day shift on 08/20/13, nursing staff stated that patient refused to switch over to bariatric bed.  Patient stated that he is willing to move to the bed in the morning after breakfast.

## 2013-08-22 DIAGNOSIS — D649 Anemia, unspecified: Secondary | ICD-10-CM

## 2013-08-22 DIAGNOSIS — T8740 Infection of amputation stump, unspecified extremity: Secondary | ICD-10-CM

## 2013-08-22 LAB — BASIC METABOLIC PANEL
BUN: 24 mg/dL — ABNORMAL HIGH (ref 6–23)
CO2: 20 mEq/L (ref 19–32)
Chloride: 110 mEq/L (ref 96–112)
GFR calc Af Amer: 51 mL/min — ABNORMAL LOW (ref >90)
GFR calc non Af Amer: 44 mL/min — ABNORMAL LOW (ref >90)
Potassium: 4.2 mEq/L (ref 3.5–5.1)
Sodium: 139 mEq/L (ref 135–145)

## 2013-08-22 LAB — CBC WITH DIFFERENTIAL/PLATELET
Basophils Absolute: 0 10*3/uL (ref 0.0–0.1)
Basophils Relative: 0 % (ref 0–1)
Eosinophils Absolute: 1.3 10*3/uL — ABNORMAL HIGH (ref 0.0–0.7)
Lymphocytes Relative: 8 % — ABNORMAL LOW (ref 12–46)
Lymphs Abs: 1.3 10*3/uL (ref 0.7–4.0)
MCH: 27.2 pg (ref 26.0–34.0)
MCHC: 31.2 g/dL (ref 30.0–36.0)
Monocytes Relative: 10 % (ref 3–12)
Neutro Abs: 12.2 10*3/uL — ABNORMAL HIGH (ref 1.7–7.7)
Neutrophils Relative %: 74 % (ref 43–77)
Platelets: 482 10*3/uL — ABNORMAL HIGH (ref 150–400)
RBC: 3.02 MIL/uL — ABNORMAL LOW (ref 4.22–5.81)

## 2013-08-22 LAB — GLUCOSE, CAPILLARY
Glucose-Capillary: 131 mg/dL — ABNORMAL HIGH (ref 70–99)
Glucose-Capillary: 130 mg/dL — ABNORMAL HIGH (ref 70–99)

## 2013-08-22 MED ORDER — DOCUSATE SODIUM 283 MG RE ENEM
1.0000 | ENEMA | Freq: Every day | RECTAL | Status: DC | PRN
  Filled 2013-08-22: qty 1

## 2013-08-22 MED ORDER — BISACODYL 10 MG RE SUPP: 10.0000 mg | Freq: Every day | RECTAL | Status: DC | PRN

## 2013-08-22 NOTE — Progress Notes (Addendum)
[facility]   Day #2 rocephin restarted  Subjective: mx complaints re mattresses, care from RN staff, etc   Antibiotics:  Anti-infectives   Start     Dose/Rate Route Frequency Ordered Stop   08/20/13 1000  cefTRIAXone (ROCEPHIN) 2 g in dextrose 5 % 50 mL IVPB     2 g 100 mL/hr over 30 Minutes Intravenous Every 24 hours 08/20/13 0952     08/18/13 1400  clindamycin (CLEOCIN) IVPB 900 mg     900 mg 100 mL/hr over 30 Minutes Intravenous On call to O.R. 08/18/13 1352 08/18/13 1431   08/18/13 1000  vancomycin (VANCOCIN) 1,500 mg in sodium chloride 0.9 % 500 mL IVPB  Status:  Discontinued     1,500 mg 250 mL/hr over 120 Minutes Intravenous Every 12 hours 08/17/13 2029 08/20/13 0952   08/18/13 0600  piperacillin-tazobactam (ZOSYN) IVPB 3.375 g  Status:  Discontinued     3.375 g 12.5 mL/hr over 240 Minutes Intravenous Every 8 hours 08/17/13 2029 08/20/13 0952   08/17/13 2315  cefTRIAXone (ROCEPHIN) 2 g in dextrose 5 % 50 mL IVPB  Status:  Discontinued     2 g 100 mL/hr over 30 Minutes Intravenous Every 24 hours 08/17/13 2303 08/17/13 2304   08/17/13 2030  vancomycin (VANCOCIN) 2,500 mg in sodium chloride 0.9 % 500 mL IVPB     2,500 mg 250 mL/hr over 120 Minutes Intravenous STAT 08/17/13 2029 08/17/13 2345   08/17/13 2030  piperacillin-tazobactam (ZOSYN) IVPB 3.375 g     3.375 g 100 mL/hr over 30 Minutes Intravenous STAT 08/17/13 2029 08/17/13 2146      Medications: Scheduled Meds: . aspirin EC  325 mg Oral Daily  . cefTRIAXone (ROCEPHIN)  IV  2 g Intravenous Q24H  . docusate sodium  100 mg Oral BID  . feeding supplement (PRO-STAT SUGAR FREE 64)  30 mL Oral BID  . hydrALAZINE  10 mg Oral Q6H  . insulin aspart  0-15 Units Subcutaneous TID WC  . OxyCODONE  30 mg Oral Q12H  . silver sulfADIAZINE   Topical Daily  . sodium chloride  3 mL Intravenous Q12H   Continuous Infusions: . sodium chloride     PRN Meds:.bisacodyl, docusate sodium,  HYDROmorphone (DILAUDID) injection, metoCLOPramide (REGLAN) injection, metoCLOPramide, ondansetron (ZOFRAN) IV, ondansetron, oxyCODONE-acetaminophen, senna-docusate, sodium chloride   Objective: Weight change:   Intake/Output Summary (Last 24 hours) at 08/22/13 1720 Last data filed at 08/22/13 1300  Gross per 24 hour  Intake    480 ml  Output   1400 ml  Net   -920 ml   Blood pressure 137/64, pulse 63, temperature 98.4 F (36.9 C), temperature source Oral, resp. rate 14, height 5\' 11"  (1.803 m), weight 411 lb 13.1 oz (186.8 kg), SpO2 97.00%. Temp:  [97.8 F (36.6 C)-98.6 F (37 C)] 98.4 F (36.9 C) (11/05 0825) Pulse Rate:  [63-82] 63 (11/05 0825) Resp:  [14-18] 14 (11/05 0825) BP: (137-158)/(62-77) 137/64 mmHg (11/05 1308) SpO2:  [97 %-99 %] 97 % (11/05 0825)  Physical Exam: General: ao x3  Left arm with bandage as well at shoulder site Right arm with PICC CDI  Right and left legs bandaged  Lab Results:  Recent Labs  08/20/13 0515 08/22/13 0540  WBC 20.5* 16.4*  HGB 8.4* 8.2*  HCT 26.4* 26.3*  PLT 503* 482*    BMET  Recent Labs  08/20/13 0515 08/22/13 0540  NA 137 139  K 4.2 4.2  CL 107  110  CO2 20 20  GLUCOSE 153* 136*  BUN 19 24*  CREATININE 1.80* 1.91*  CALCIUM 7.9* 8.2*    Micro Results: Recent Results (from the past 240 hour(s))  MRSA PCR SCREENING     Status: None   Collection Time    08/18/13  2:45 AM      Result Value Range Status   MRSA by PCR NEGATIVE  NEGATIVE Final   Comment:            The GeneXpert MRSA Assay (FDA     approved for NASAL specimens     only), is one component of a     comprehensive MRSA colonization     surveillance program. It is not     intended to diagnose MRSA     infection nor to guide or     monitor treatment for     MRSA infections.  CULTURE, BLOOD (ROUTINE X 2)     Status: None   Collection Time    08/18/13  3:50 AM      Result Value Range Status   Specimen Description BLOOD RIGHT HAND   Final    Special Requests BOTTLES DRAWN AEROBIC AND ANAEROBIC 10CC   Final   Culture  Setup Time     Final   Value: 08/18/2013 19:05     Performed at [facility]   Culture     Final   Value:        BLOOD CULTURE RECEIVED NO GROWTH TO DATE CULTURE WILL BE HELD FOR 5 DAYS BEFORE ISSUING A FINAL NEGATIVE REPORT     Performed at [facility]   Report Status PENDING   Incomplete  CULTURE, BLOOD (ROUTINE X 2)     Status: None   Collection Time    08/18/13  6:02 AM      Result Value Range Status   Specimen Description BLOOD RIGHT HAND   Final   Special Requests BOTTLES DRAWN AEROBIC ONLY 1 CC   Final   Culture  Setup Time     Final   Value: 08/18/2013 19:04     Performed at [facility]   Culture     Final   Value:        BLOOD CULTURE RECEIVED NO GROWTH TO DATE CULTURE WILL BE HELD FOR 5 DAYS BEFORE ISSUING A FINAL NEGATIVE REPORT     Performed at [facility]   Report Status PENDING   Incomplete  ANAEROBIC CULTURE     Status: None   Collection Time    08/18/13  3:13 PM      Result Value Range Status   Specimen Description ABSCESS LEFT SHOULDER   Final   Special Requests PATIENT ON FOLLOWING VANC ZOSYN CLINDAMYCIN   Final   Gram Stain     Final   Value: FEW WBC PRESENT,BOTH PMN AND MONONUCLEAR     NO SQUAMOUS EPITHELIAL CELLS SEEN     NO ORGANISMS SEEN     Performed at [facility]   Culture     Final   Value: NO ANAEROBES ISOLATED; CULTURE IN PROGRESS FOR 5 DAYS     Performed at [facility]   Report Status PENDING   Incomplete  CULTURE, ROUTINE-ABSCESS     Status: None   Collection Time    08/18/13  3:13 PM      Result Value Range Status   Specimen Description ABSCESS LEFT SHOULDER   Final   Special  Requests PATIENT ON FOLLOWING VANC ZOSYN CLINDAMYCIN   Final   Gram Stain     Final   Value: FEW WBC PRESENT,BOTH PMN AND MONONUCLEAR     NO SQUAMOUS EPITHELIAL CELLS SEEN     NO ORGANISMS SEEN     Performed at [facility]    Culture     Final   Value: NO GROWTH 3 DAYS     Performed at [facility]   Report Status 08/21/2013 FINAL   Final  CULTURE, ROUTINE-ABSCESS     Status: None   Collection Time    08/18/13  3:20 PM      Result Value Range Status   Specimen Description ABSCESS LEFT FOREARM   Final   Special Requests PATIENT ON FOLLOWING VANCOMYCIN ZOSYN CLINDAMYCIN   Final   Gram Stain     Final   Value: MODERATE WBC PRESENT,BOTH PMN AND MONONUCLEAR     NO SQUAMOUS EPITHELIAL CELLS SEEN     NO ORGANISMS SEEN     Performed at [facility]   Culture     Final   Value: NO GROWTH 3 DAYS     Performed at [facility]   Report Status 08/21/2013 FINAL   Final  ANAEROBIC CULTURE     Status: None   Collection Time    08/18/13  3:20 PM      Result Value Range Status   Specimen Description ABSCESS LEFT FOREARM   Final   Special Requests PATIENT ON FOLLOWING VANCOMYCIN ZOSYN CLINDAMYCIN   Final   Gram Stain     Final   Value: FEW WBC PRESENT,BOTH PMN AND MONONUCLEAR     NO SQUAMOUS EPITHELIAL CELLS SEEN     NO ORGANISMS SEEN     Performed at [facility]   Culture     Final   Value: NO ANAEROBES ISOLATED; CULTURE IN PROGRESS FOR 5 DAYS     Performed at [facility]   Report Status PENDING   Incomplete    Studies/Results: No results found.    Assessment/Plan: Adrian Khan is a 37 y.o. 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  At the time attempts to aspirate the shoulder by IR were compromised by his size as were imaging such as MRI and Orthopedics at that time were not convinced for septic arthritis. Ultimately pt was DC with IV rocephin and finally an MRI performed at [facility] that showed large shoulder effusion with multiple small abscesses and pymyositis along with abscess in biceps muscle. Pt was taken to the OR on Saturday by Dr. Lajoyce Corners and had I &D  of the left shoulder and left arm. Cultures are without an organism  #1 Septic shoulder with pyomyositis also with arm abscess and Group B streptococcal bacteremia: Organism blood is undoubtedly same one that was responsible for his septic arthritis and pyomyositis. His antibiotics SHOULD have remained NARROW preoperatively to avoid further obfuscation. I am confident though that this is all Group B strep  ---continue Rocephin 2 g IV daily and finish SIX WEEKS  of postoperative abx, which puts stop date at [id number] --he will need weekly CBC and BMP faxed to me at [phone]   I spent greater than 45 minutes with the patient including greater than 50% of time in face to face counsel of the patient and in coordination of their care.  #  2 Foot ulcer: certainly could have been portal, needs close monitoring along with other foot  I will set him up with HSFU with [place]  I will otherwise sign off for now  Please call with further questions.   LOS: 5 days   Acey Lav 08/22/2013, 5:20 PM

## 2013-08-22 NOTE — Progress Notes (Signed)
Patient is refusing SNF even after much discussion. Clinical Social Worker will sign off for now as social work intervention is no longer needed. Please consult [place] again if new need arises.    Sabino Niemann, MSW, [employer] [phone]

## 2013-08-22 NOTE — Progress Notes (Signed)
[facility] PROGRESS NOTE  Adrian Khan ZOX:[id number] DOB: [birth year] DOA: 08/17/2013 PCP: Laurena Slimmer, MD  Brief narrative: 37 year old male with past medical history of morbid obesity, uncontrolled diabetes who presented to [facility] ED with complaints of left arm pain for past 5-6 weeks prior to this admission. Pt was seen by Dr. Daiva Eves of ID 10/16 who reported suspicion for septic arthritis. MRI was performed as an outpatient 08/17/2013 with findings of large shoulder joint effusion, multiple fluid collections concerning for small abscesses around the joint capsule, and pyomyositis going down his biceps area. Orthopedic surgery performed washout 08/18/2013.   Assessment/Plan:  Principal Problem:  Septic arthritis of shoulder, left S/P i&d: cultures NGTD, routine cultures NG final Per ID and ortho Refusing SNF, but too weak to get out of bed  DIABETES, TYPE 2, uncontrolled with complications of diabetic foot ulcer and renal manifestations CBGs ok currently  Anemia due to chronic illness Improved after transfusion  Extreme Morbid obesity, BMI over 41  Diabetic foot ulcer, stable. Continue wound care  HTN (hypertension) blood pressures mildly elevated, may be related to uncontrolled pain -  If still elevated today, consider starting ACEI tomorrow  CKD stage 3, creatinine near baseline 1.8-2 -  Minimize nephrotoxins -  Renally dose medications  OHS/OSA - CPAPat night  Mid back with stage II pressure sore, stable.  Continue air mattress  Constipation with abdominal pain.  Refuses abdominal exam.  Attributes pain to colace and refusing additional stool softeners -  Continue colace and patient may decline -  Bisacodyl and enemeez per rectum prn  Leukocytosis, resolving with IV antibiotics  Code Status: full code Family Communication: patient's mother at bedside Disposition Plan: refusing SNF, but too deconditioned to be cared for at home.     Consultants:  Orthopedic surgery (Dr. Lajoyce Corners)  Procedures:  Arthroscopic washout and  debridement 08/18/2013  Antibiotics:  Vanco 08/17/2013 -->11/3  Zosyn 08/17/2013 -->11/3  Rocephin 11/3  HPI/Subjective: Shoulder pain persists.  Does not want SNF and refusing to get out of bed because he is concerned it will hurt his shoulder.  Reassured him that we would not ask him to perform tasks that we thought would harm his chances for successful healing and that we want him to continue to improve.  Having some abdominal pain.   Objective: Filed Vitals:   08/21/13 0511 08/21/13 1427 08/21/13 2111 08/22/13 0619  BP: 150/83 145/67 140/62 145/68  Pulse: 96 104 82 80  Temp: 98.7 F (37.1 C) 97.7 F (36.5 C) 97.8 F (36.6 C) 98.6 F (37 C)  TempSrc:      Resp: 16 20 18 18   Height:      Weight:      SpO2: 99% 100% 99% 99%    Intake/Output Summary (Last 24 hours) at 08/22/13 0930 Last data filed at 08/22/13 0500  Gross per 24 hour  Intake    240 ml  Output   1500 ml  Net  -1260 ml    Exam:   General:  Awake, alert, AAM, NAD  HEENT:  NCAT, MMM  Cardiovascular: Regular rate and rhythm, S1/S2, no murmurs, no rubs, no gallops  Respiratory: Clear to auscultation bilaterally, no wheezing, no crackles, no rhonchi  Abdomen: Soft, non tender, non distended, bowel sounds present, no guarding  Extremities: left shoulder dressing c/d/i with soreness surrounding.  Left anterior arm incision well approximated, some serosanguinous drainage present and middle may have some mild dehiscence or may be where a drain was  removed per family.    Data Reviewed: Basic Metabolic Panel:  Recent Labs Lab 08/17/13 1850 08/18/13 0350 08/20/13 0515 08/22/13 0540  NA 135 136 137 139  K 3.8 3.9 4.2 4.2  CL 106 106 107 110  CO2 21 20 20 20   GLUCOSE 89 108* 153* 136*  BUN 13 14 19  24*  CREATININE 1.19 1.30 1.80* 1.91*  CALCIUM 8.3* 8.2* 7.9* 8.2*   Liver Function Tests: No results found  for this basename: AST, ALT, ALKPHOS, BILITOT, PROT, ALBUMIN,  in the last 168 hours No results found for this basename: LIPASE, AMYLASE,  in the last 168 hours No results found for this basename: AMMONIA,  in the last 168 hours CBC:  Recent Labs Lab 08/17/13 1850 08/18/13 0350 08/20/13 0515 08/22/13 0540  WBC 14.1* 15.3* 20.5* 16.4*  NEUTROABS 9.8*  --  15.6* 12.2*  HGB 7.3* 6.8* 8.4* 8.2*  HCT 23.3* 22.2* 26.4* 26.3*  MCV 84.7 85.4 86.0 87.1  PLT 607* 598* 503* 482*   Cardiac Enzymes: No results found for this basename: CKTOTAL, CKMB, CKMBINDEX, TROPONINI,  in the last 168 hours BNP: No components found with this basename: POCBNP,  CBG:  Recent Labs Lab 08/21/13 0648 08/21/13 1123 08/21/13 1633 08/21/13 2153 08/22/13 0627  GLUCAP 118* 134* 160* 162* 131*    MRSA PCR SCREENING     Status: None   Collection Time    08/18/13  2:45 AM      Result Value Range Status   MRSA by PCR NEGATIVE  NEGATIVE Final     Studies: Mr Outside Films Lower Extremity 08/17/2013   This examination belongs to an outside facility and is stored  here for comparison purposes only.  Contact the originating outside  institution for any associated report or interpretation.   Renae Fickle, MD  [facility] Pager [phone]  If 7PM-7AM, please contact night-coverage [email or web address] Password [facility] 08/19/2013, 11:53 AM

## 2013-08-22 NOTE — Progress Notes (Signed)
Dressing changes were done to bilateral lower extremeties and to left arm.  Left shoulder mepilex was CDI. Patient tolerated dressing changes very well.  He was pleasant and telling jokes.

## 2013-08-22 NOTE — Progress Notes (Signed)
Occupational Therapy Treatment Patient Details Name: Adrian Khan MRN: [id number] DOB: 03-16-1976 Today's Date: 08/22/2013 Time: 6578-4696 OT Time Calculation (min): 60 min  OT Assessment / Plan / Recommendation  History of present illness  s/p I & D of L shoulder and elbow, suspicious for septic arthritis.  Culture negative. PMH: morbid obesity, anemia, wounds on back, CKD, HTN, diabetic foot ulcer.   OT comments  Pt declining mobility other than ROM of L UE.  Allowing a little more movement of L elbow and shoulder and retrograde massage of L hand.  No functional use of L UE. Needs a squeeze ball.  Mom not available to instruct.  Follow Up Recommendations  SNF (pt is adamant he will do better at home)    Barriers to Discharge       Equipment Recommendations  3 in 1 bedside comode    Recommendations for Other Services    Frequency Min 2X/week   Progress towards OT Goals Progress towards OT goals: Not progressing toward goals - comment  Plan      Precautions / Restrictions Precautions Precautions: Fall Type of Shoulder Precautions: NWB L shoulder Restrictions Weight Bearing Restrictions: No (Simultaneous filing. User may not have seen previous data.) LUE Weight Bearing: Weight bearing as tolerated (Simultaneous filing. User may not have seen previous data.)   Pertinent Vitals/Pain L shoulder and back, did not rate, premedicated    ADL       OT Diagnosis:    OT Problem List:   OT Treatment Interventions:     OT Goals(current goals can now be found in the care plan section) Acute Rehab OT Goals Patient Stated Goal: To get back to his outdoor hobbies  Visit Information  Last OT Received On: 08/22/13 Assistance Needed: +3 or more    Subjective Data      Prior Functioning       Cognition  Cognition Arousal/Alertness: Awake/alert Behavior During Therapy: Anxious Overall Cognitive Status: Within Functional Limits for tasks assessed    Mobility        Exercises  General Exercises - Upper Extremity Shoulder Flexion: PROM;Left;5 reps;Supine Shoulder ABduction: PROM;Left;5 reps;Supine Elbow Flexion: PROM;Left;5 reps;Supine;AAROM Elbow Extension: PROM;AAROM;Left;5 reps;Supine Wrist Flexion: AROM;AAROM;Left;5 reps;Supine Wrist Extension: AROM;AAROM;Left;5 reps;Supine Digit Composite Flexion: AROM;Left;10 reps;Supine Composite Extension: AROM;Left;10 reps;Supine Other Exercises Other Exercises: Tolerated very slow PROM of R shoulder to 60* FF and abd washed under L arm and dried.Retrograde massage to L hand with lotion followed by positioning L UE on 2 pillows.   Balance     End of Session OT - End of Session Activity Tolerance: Patient limited by pain Patient left: in bed;with call bell/phone within reach;with nursing/sitter in room  GO     Evern Bio 08/22/2013, 11:45 AM [phone]

## 2013-08-22 NOTE — Progress Notes (Signed)
Patient ID: Adrian Khan, male   DOB: 1976/05/18, 37 y.o.   MRN: [id number] Cultures from left arm and left shoulder negative to date. Patient has improved function with his left arm however patient is unable to independently ambulate or transfer.

## 2013-08-22 NOTE — Progress Notes (Signed)
PT Cancellation Note  Patient Details Name: Adrian Khan MRN: [id number] DOB: 1976/07/23   Cancelled Treatment:    Reason Eval/Treat Not Completed: Pain limiting ability to participate. Attempted to coordinate session with pain medication and staff for a planned time this morning. Pt and mother request session at 2:30pm due to pain. Will attempt again this afternoon time permitting.   Ruthann Cancer 08/22/2013, 12:15 PM  Ruthann Cancer, PT, DPT [phone]

## 2013-08-22 NOTE — Progress Notes (Signed)
08/22/13 Spoke with patient, he still wants to go home instead of SNF. Set up [facility], PT, OT, aide, social worker and IV antibiotics. Contacted Darian at [facility] for alternating air mattress for his hospital bed at home.Informed patient that MD anticipating  discharge 08/23/13. Will continue to follow. Jacquelynn Cree RN, BSN, CCM

## 2013-08-23 ENCOUNTER — Emergency Department (HOSPITAL_COMMUNITY)
Admission: EM | Admit: 2013-08-23 | Discharge: 2013-08-23 | Disposition: A | Discharge status: Home / Self Care | Payer: PRIVATE HEALTH INSURANCE

## 2013-08-23 DIAGNOSIS — I10 Essential (primary) hypertension: Secondary | ICD-10-CM

## 2013-08-23 LAB — ANAEROBIC CULTURE

## 2013-08-23 LAB — GLUCOSE, CAPILLARY: Glucose-Capillary: 110 mg/dL — ABNORMAL HIGH (ref 70–99)

## 2013-08-23 MED ORDER — HYDRALAZINE HCL 25 MG PO TABS: 25.0000 mg | ORAL_TABLET | Freq: Three times a day (TID) | ORAL | Status: DC

## 2013-08-23 MED ORDER — INSULIN ASPART 100 UNIT/ML FLEXPEN: 0.0000 [IU] | PEN_INJECTOR | Freq: Three times a day (TID) | SUBCUTANEOUS | Status: DC

## 2013-08-23 MED ORDER — PRO-STAT SUGAR FREE PO LIQD: 30.0000 mL | Freq: Two times a day (BID) | ORAL | Status: DC

## 2013-08-23 MED ORDER — MORPHINE SULFATE ER 30 MG PO TBCR: 30.0000 mg | EXTENDED_RELEASE_TABLET | Freq: Two times a day (BID) | ORAL | Status: DC

## 2013-08-23 MED ORDER — OXYCODONE-ACETAMINOPHEN 5-325 MG PO TABS: 1.0000 | ORAL_TABLET | ORAL | Status: DC | PRN

## 2013-08-23 NOTE — ED Notes (Addendum)
Patient placed on pod c. Per ptar patient was discharged home and when they arrived there was no mattress on his bed. ptar attempted to return to the patient to his hospital room but the floor advised he had been discharged. Patient placed on hospital bed to await social worker to coordinate with [facility] and have mattress delivered to pt home tonight so he can be discharged. Pt denies any complaint. Pt to be held on pod c to await home medical equipment to be delivered

## 2013-08-23 NOTE — Progress Notes (Signed)
ED CM consulted by ED CSW. Pt was discharged from [address] [place] today transported by [employer]. DME Bariatric mattress was ordered to be delivered by [facility]. Between 4-6pm according to patient's mom. Pt arrived prior to mattress delivery and was [facility] drivers were told by mom that she could not accept him until the mattress arrives. [facility]AR was instructed to take him back to the hospital.

## 2013-08-23 NOTE — Progress Notes (Signed)
Inpatient Diabetes Program Recommendations  AACE/ADA: New Consensus Statement on Inpatient Glycemic Control (2013)  Target Ranges:  Prepandial:   less than 140 mg/dL      Peak postprandial:   less than 180 mg/dL (1-2 hours)      Critically ill patients:  140 - 180 mg/dL   Dr. Malachi Bonds paged regarding blood glucose meter for pt.  Pt told MD that someone had said that he would receive a glucose meter prior to discharge.  Inpatient Diabetes does not have meters available for pts and request MD to give prescription for meter at discharge. ? possilbility of case manager or [facility] RN may have spoke to pt about meter. Discussed with MD.  Thank you. Ailene Ards, RD, LDN, CDE Inpatient Diabetes Coordinator [phone]

## 2013-08-23 NOTE — Progress Notes (Signed)
08/23/2013 2020 W. Aundria Rud RN BSN  ED CM received message from [employer] on [address] that reported [facility] called Gel Overlay mattress Delivered at patient's home. Spoke with Nurse on Pod C to arrange [facility] for transport home. No further ED CM needs identified  08/23/2013 1930  W. Clydell Alberts RN BSN Spoke with Hospital doctor in Logistics at [facility] she will contact the [facility] driver to find out the status of delivery. Provided her with  Tobi Bastos RN  on [employer] C phone number to contact her with delivery status.    11/6/20141859 W. Aundria Rud RN BSN  ED CM attempted to contact [facility] to follow up on the Gel overlay mattress. Reached the Answering service, ED CM asked to have Director paged. Spoke with mom who voiced her concerns about the disposition process. Apologized and assured her that will attempt contact [facility] Director and get back to her.    08/23/2013 1825 W. Aundria Rud RN BSN  ED CM Attempted to contact  [facility] again. Unable to reach anyone by phone. Discussed  with Drinda Butts RN on Pod C. Will continue to call.   08/23/13 1804 W. Aundria Rud RN BSN ED CM  contacted Mom about Gel overlay delivery from [facility]. Mom reports no one has contacted her. Called placed by ED CM to [facility]  Spoke with Daphne at [phone] concerning the Gel Overlay delivery. She states, she was unaware and she will contact the delivery Department and notify ED CM.

## 2013-08-23 NOTE — Progress Notes (Signed)
08/23/13 Air overlay switched to gel overlay due to air overlay unable to support patient's weight. [facility] to deliver gel overlay to patient's home. Jacquelynn Cree RN, BSN, CCM    08/22/13 Spoke with patient, he still wants to go home instead of SNF. Set up [facility], PT, OT, aide, social worker and IV antibiotics. Contacted Darian at [facility] for alternating air mattress for his hospital bed at home.Informed patient that MD anticipating  discharge 08/23/13. Will continue to follow. Jacquelynn Cree RN, BSN, CCM   08/20/13 Spoke with patient about d/c plans, I explained that inpatient rehab is recommending therapy at a SNF. Patient states that he will not go to a SNF. We discussed how he needs more than one person to assist with transfer and his mother is the only assist he has at home. I explained that we can do home health RN, PT,OT and aide but they will not be able to stay with him. I explained that there are agencies that provide aides to stay with patients but it would not be covered by insurance. He has a hospital bed, wheelchair and rolling walker at home.He wants to see how he does with therapy tomorrow before deciding about d/c plan. I will f/u with patient 08/21/13. Contacted [facility], informed Pam that currently patient wants to return home with HHC. Jacquelynn Cree RN, BSN, [facility]  08/20/13 PT/OT recommended inpatient rehab. CIR recommended SNF. Referral made to CSW. Will continue to follow for d/c needs. Jacquelynn Cree RN.BSN, CCM  08/18/2013 1000 Pt active with [facility] and has hosptial bed, RW, and wheelchair at home. Isidoro Donning RN CCM Case Mgmt phone [phone]

## 2013-08-23 NOTE — Progress Notes (Signed)
ED CM phone Derrien at [facility] to check on prospective delivery time. He suggests we contact mom. Spoke with Mom she is still waiting on [facility] delivery. She was asked to contact me in the ED at [phone], as soon as [facility] arrives with the mattress, so we can contact [facility] for transport.  Mom verbalizes understanding.

## 2013-08-23 NOTE — ED Notes (Signed)
PER CASE MANAGER. [facility] IS ENROUTE TO PT HOME NOW

## 2013-08-23 NOTE — ED Notes (Signed)
Pt spoke with his mother. His mattress is set up at home.

## 2013-08-23 NOTE — ED Notes (Signed)
Medical equipment company called, and reported that the required mattress was on the way to the pt's house. Waiting for a call from the pt's mother to confirm that the mattress has been set up.

## 2013-08-23 NOTE — Progress Notes (Signed)
PT Cancellation Note  Patient Details Name: Adrian Khan MRN: [id number] DOB: 16-Nov-1975   Cancelled Treatment:    Reason Eval/Treat Not Completed: Patient declined, no reason specified. Pt is to d/c home today per chart.   Ruthann Cancer 08/23/2013, 1:52 PM  Ruthann Cancer, PT, DPT [phone]

## 2013-08-23 NOTE — Progress Notes (Signed)
Occupational Therapy Treatment Patient Details Name: VASILI FOK MRN: [id number] DOB: [birth year] Today's Date: 08/23/2013 Time: 7829-5621 OT Time Calculation (min): 50 min  OT Assessment / Plan / Recommendation  History of present illness [name], Bil heel ulcers, [name], obesity   OT comments  Pt with plans for home today. Improvement noted in L elbow extension and flexion, finger ROM and toleration of abduction of L shoulder for cleaning under arm.    Follow Up Recommendations  Home health OT;Supervision/Assistance - 24 hour    Barriers to Discharge       Equipment Recommendations       Recommendations for Other Services    Frequency     Progress towards OT Goals Progress towards OT goals: Progressing toward goals  Plan Discharge plan remains appropriate    Precautions / Restrictions Precautions Precautions: Fall Type of Shoulder Precautions: NWB L shoulder   Pertinent Vitals/Pain L shoulder with ROM, repositioned, premedicated    ADL  ADL Comments: encouraged pt to incorporate use of L UE in B activities, self ROM with cane for elbow flex and ext, IR/ER, instructed in edema management of L UE through elevation on at least 3 pillows, retrograde massage, and active muscle use, pt verbalized understanding of all.    OT Diagnosis:    OT Problem List:   OT Treatment Interventions:     OT Goals(current goals can now be found in the care plan section) Acute Rehab OT Goals Patient Stated Goal: To get back to his outdoor hobbies  Visit Information  Last OT Received On: 08/23/13 Assistance Needed: +3 or more History of Present Illness: ARTHROSCOPY SHOULDER irrigation and debridement and subacromial decompresion and debridement of abcess left arm with placement of Penrose  drain, Bil heel ulcers, [name], obesity    Subjective Data      Prior Functioning       Cognition  Cognition Arousal/Alertness: Awake/alert Behavior During Therapy: Anxious Overall Cognitive Status: Within Functional Limits for tasks assessed    Mobility  Bed Mobility Bed Mobility: Not assessed    Exercises  General Exercises - Upper Extremity Shoulder Flexion: PROM;Left;5 reps;Supine Shoulder ABduction: PROM;Left;5 reps;Supine Elbow Flexion: PROM;Left;5 reps;Supine;AAROM Elbow Extension: PROM;AAROM;Left;5 reps;Supine Wrist Flexion: AROM;AAROM;Left;5 reps;Supine Wrist Extension: AROM;AAROM;Left;5 reps;Supine Digit Composite Flexion: AROM;Left;10 reps;Supine Composite Extension: AROM;Left;10 reps;Supine Other Exercises Other Exercises: Tolerated very slow PROM of R shoulder to 60* FF and abd washed under L arm and dried.   Balance     End of Session OT - End of Session Activity Tolerance: Patient limited by pain Patient left: in bed;with call bell/phone within reach;with nursing/sitter in room  GO     Evern Bio 08/23/2013, 2:44 PM [phone]

## 2013-08-23 NOTE — ED Notes (Signed)
[facility] called to come pick up the pt.

## 2013-08-23 NOTE — Discharge Summary (Signed)
Physician Discharge Summary  Adrian Khan NFA:[id number] DOB: Aug 12, 1976 DOA: 08/17/2013  PCP: Adrian Slimmer, MD  Admit date: 08/17/2013 Discharge date: 08/23/2013  Recommendations for Outpatient Follow-up:  1. Home health PT/OT/RN/aid/SW 2. PICC line for administration of IV antibiotics through 09/28/2013.  Ceftriaxone 2gm IV once daiily 3. Weekly BMP and CBC to be faxed to Dr. Daiva Eves, infectious disease, [phone]. 4. Home equipment including bed with air mattress provided 5. Started aspart and hydralazine 6. Changed oxycontin to MS contin  7. PCP in 1-2 weeks.  Repeat iron studies with transferrin when health improved.  Check blood pressure.  Follow up final report of pending blood and wound cultures. 8. [facility] and Dr. Lajoyce Corners for follow up septic arthritis in 2 weeks  Discharge Diagnoses:  Principal Problem:   Septic arthritis of shoulder, left Active Problems:   Type II or unspecified type diabetes mellitus with renal manifestations, not stated as uncontrolled(250.40)   OBSTRUCTIVE SLEEP APNEA   Leukocytosis   Anemia due to chronic illness   Morbid obesity, BMI unknown   Diabetic foot ulcer   HTN (hypertension)   CKD (chronic kidney disease), stage III   Discharge Condition: stable, fair  Diet recommendation: diabetic diet  Wt Readings from Last 3 Encounters:  08/18/13 186.8 kg (411 lb 13.1 oz)  08/18/13 186.8 kg (411 lb 13.1 oz)  07/25/13 185.3 kg (408 lb 8.2 oz)   Wound care:   Silvadene dressing bilateral foot wounds, change and cleanse daily. Change dressing left arm daily or as needed for saturation:  Dry dressing with 4 x 4's ABDs and Ace wrap.  Okay to get wet but not soak.    Mepilex dressing to left shoulder arthroscopic portals change as needed.  Wound care to left elbow (unstageable pressure ulcer): Cleanse with NS, pat dry.  Cover with soft silicone foam dressing:  Allevyn Hart [place] [phone]).  Change every 3-4 days and PRN leakage/soiling of  dressing.  Wound care to partial thickness skin loss secondary to moisture on the midthoracic back:  Cleanse with NS, pat gently dry.  Cover with 1/4 piece of calcium alginate dressing Hart [place] [phone]), top with 5x5-inch soft silicone foam dressing.  Change calcium alginate daily by peeling back the silicone dressing.  Change the silicone dressing twice weekly on Sundays and Wednesdays and PRN.  History of present illness:   Adrian Khan is a 37 y.o. male who presents to the ED with ongoing complaints of L sided arm pain for the past 5-6 weeks. This pain has been present through his previous admission to the hospital when he was treated for GBS sepsis with likely foot as the initial source. Dr. Zenaida Niece Dam's last progress note dated on 10/16 documents that at that time there was a high degree of suspicion that he may also have septic arthritis of his shoulder as well, however due to patient obesity he would need to get MRI as outpatient. He was discharged on IV rocephin for GBS positive blood cultures which was sensitive to this.  MRI was performed as an outpatient today, and unfortunately it appears as though Dr. Daiva Eves is indeed correct. The patient has a large shoulder joint effusion, multiple fluid collections concerning for small abscesses around the joint capsule, and pyomyositis going down his biceps as well.  Hospital Course:   [name] was hospitalized with septic arthritis of the left shoulder.  Infectious disease and Orthopedics were consulted.  He had already been started on IV antibiotics during his previous admission  and he continued ceftriaxone during his stay.  He underwent I&D and washout of the left shoulder on 08/18/2013 by Dr. Lajoyce Corners including debridement of a left arm abscess with placement of a penrose drain.  His cultures remained negative, however, he had been receiving antibiotics prior to surgery.  It was presumed that his infectious was due to GBS given his previous bacteremia.   He had clinical improvement, alhthough he continued to have severe pain of the left shoulder and arm.  He remained afebrile.  His WBC trended down, however, remained elevated around 16K.  His penrose drain was removed and his incisions appeared to be healing well.  Infectious disease recommended continuing IV ceftriaxone for a total of 6 weeks.  They will follow up with him in clinic in a few weeks.  He will additionally follow up with Dr. Lajoyce Corners in 2 weeks.  He does not need any imaging prior to his follow up appointment.  He was seen by PT and OT who recommended SNF due to severe deconditioning, however, he declined SNF placement at this time.  For pain control, he was given oxycontin 30mg  BID with percocet for breakthrough pain.    T2DM, uncontrolled with diabetic foot ulcer and renal manifestations, A1c 07/25/2013 13.7.  He normally uses lantus 22 units BID, however, during this admission, he was given moderate dose SSI with fair diabetic control.  He would be at risk for hypoglycemia if he resumed his lantus at discharge, so recommended that he use aspart SSI with meals at home until his blood sugars start to recover.  His home health RN and PCP can assist with restarting his lantus at home when blood sugars tolerate.    Anemia of chronic disease and acute blood loss from surgery, his hemoglobin trended down to 6.8 and improved with blood transfusion.  His ferritin was elevated and iron levels low.  Transferrin was not obtained to confirm if he has iron deficiency.  Would recommend repeating tests after he has recovered from this acute illness.  B12 was wnl  Morbid obesity BMI > 41.  Recommend weight loss as outpatient.    Diabetic foot ulcers, stable. Silvadene once daily covered with dry dressing.  See above for wound care recommendations  Elevated blood pressures.  Trending down some as infection is treated and pain improves.  Was started on hydralazine three time daily for now.    CKD stage 3 due  to diabetes.  Weekly BMP to check kidney function post discharge  OSA/OHS, stable.  Continue CPAP nightly.  Abdominal pain with mild diarrhea.  Had 2 soft stools day prior to discharge.  Advised patient that he is at risk of c. Diff diarrhea given prolonged exposure to antibiotics.  He should return to the hospital if he develops copious watery diarrhea.    Leukocytosis, persistent.  Weekly CBC to be faxed to ID clinic.     Consultants:  Orthopedic surgery (Dr. Lajoyce Corners) Procedures:  Arthroscopic washout and debridement 08/18/2013 Antibiotics:  Vanco 08/17/2013 -->11/3  Zosyn 08/17/2013 -->11/3  Rocephin 11/3   Discharge Exam: Filed Vitals:   08/23/13 0511  BP: 145/62  Pulse: 94  Temp: 98.2 F (36.8 C)  Resp: 18   Filed Vitals:   08/22/13 1400 08/22/13 1828 08/22/13 2210 08/23/13 0511  BP:  153/77 141/74 145/62  Pulse: 89  108 94  Temp: 98.2 F (36.8 C)  97.3 F (36.3 C) 98.2 F (36.8 C)  TempSrc:   Oral Oral  Resp: 16  18  18  Height:      Weight:      SpO2: 98%  100% 99%    General: Awake, alert, AAM, NAD  HEENT: NCAT, MMM  Cardiovascular: Regular rate and rhythm, S1/S2, no murmurs, no rubs, no gallops  Respiratory: CTAB  Abdomen: Soft, non tender, non distended, bowel sounds present, no guarding  Extremities:  left shoulder dressing c/d/i with soreness surrounding but decreased from prior. Left anterior arm incision well approximated, some serosanguinous drainage present and middle may have some mild dehiscence or may be where a drain was removed per family.  Foot ulcers covered and not examined today   Discharge Instructions      Discharge Orders   Future Orders Complete By Expires   Call MD for:  difficulty breathing, headache or visual disturbances  As directed    Call MD for:  extreme fatigue  As directed    Call MD for:  hives  As directed    Call MD for:  persistant dizziness or light-headedness  As directed    Call MD for:  persistant nausea and  vomiting  As directed    Call MD for:  redness, tenderness, or signs of infection (pain, swelling, redness, odor or green/yellow discharge around incision site)  As directed    Call MD for:  severe uncontrolled pain  As directed    Call MD for:  temperature >100.4  As directed    Diet Carb Modified  As directed    Discharge instructions  As directed    Comments:     You were hospitalized with infection of the left shoulder joint and abscess of the left arm.  Please continue IV antibiotics for the next several weeks, through December 12th.  We will have a home health nurse assist you again with your antibiotics.  We will also arrange for a home health aid, physical and occupational therapist to assist you.  A social worker will assist with any additional needs that arise.  If you develop fevers, chills, swelling, increasing pain or redness, or increased discharge from any of your wounds, please seek immediate medical attention.  Additionally, if you develop copious watery diarrhea (5,6,7 times per day), please return to the hospital because this could be a sign of infectious diarrhea that needs treatment with antibiotics.   Discharge wound care:  As directed    Comments:     Silvadene dressing bilateral foot wounds, change and cleanse daily. Change dressing left arm daily or as needed for saturation:  Dry dressing with 4 x 4's ABDs and Ace wrap.  Okay to get wet but not soak.    Mepilex dressing to left shoulder arthroscopic portals change as needed.  Wound care to left elbow (unstageable pressure ulcer): Cleanse with NS, pat dry.  Cover with soft silicone foam dressing:  Allevyn Hart [place] [phone]).  Change every 3-4 days and PRN leakage/soiling of dressing.  Wound care to partial thickness skin loss secondary to moisture on the midthoracic back:  Cleanse with NS, pat gently dry.  Cover with 1/4 piece of calcium alginate dressing Hart [place] [phone]), top with 5x5-inch soft silicone foam dressing.  Change  calcium alginate daily by peeling back the silicone dressing.  Change the silicone dressing twice weekly on Sundays and Wednesdays and PRN   Increase activity slowly  As directed        Medication List    STOP taking these medications       insulin glargine 100 units/mL Soln  Commonly  known as:  LANTUS     OxyCODONE 15 mg T12a 12 hr tablet  Commonly known as:  OXYCONTIN      TAKE these medications       dextrose 5 % SOLN 50 mL with cefTRIAXone 2 G SOLR 2 g  Inject 2 g into the vein daily.     feeding supplement (PRO-STAT SUGAR FREE 64) Liqd  Take 30 mLs by mouth 2 (two) times daily.     hydrALAZINE 25 MG tablet  Commonly known as:  APRESOLINE  Take 1 tablet (25 mg total) by mouth 3 (three) times daily.     insulin aspart 100 UNIT/ML Sopn FlexPen  Commonly known as:  novoLOG  Inject 0-15 Units into the skin 3 (three) times daily with meals. Per sliding scale     morphine 30 MG 12 hr tablet  Commonly known as:  MS CONTIN  Take 1 tablet (30 mg total) by mouth every 12 (twelve) hours.     oxyCODONE-acetaminophen 5-325 MG per tablet  Commonly known as:  PERCOCET/ROXICET  Take 1-2 tablets by mouth every 4 (four) hours as needed for moderate pain.     senna-docusate 8.6-50 MG per tablet  Commonly known as:  Senokot-S  Take 1 tablet by mouth 2 (two) times daily as needed for constipation.     silver sulfADIAZINE 1 % cream  Commonly known as:  SILVADENE  Apply 1 application topically daily.       Follow-up Information   Follow up with DUDA,MARCUS V, MD In 2 weeks.   Specialty:  Orthopedic Surgery   Contact information:   [address] [place] [address] [phone]       Follow up with Acey Lav, MD. Schedule an appointment as soon as possible for a visit in 2 weeks.   Specialty:  Infectious Diseases   Contact information:   [address] [place] [address] [phone]       Follow up with Adrian Slimmer, MD.  Schedule an appointment as soon as possible for a visit in 2 weeks.   Specialty:  Internal Medicine   Contact information:   [address] [place] [address] [phone]       Follow up with [facility]. [facility] RN, Occupational Therapy, Physical Therapy, Social Worker and aide)    Contact information:   [address] [place] [address] [phone]        The results of significant diagnostics from this hospitalization (including imaging, microbiology, ancillary and laboratory) are listed below for reference.    Significant Diagnostic Studies: Ct Abdomen Pelvis Wo Contrast  07/25/2013   *RADIOLOGY REPORT*  Clinical Data: Back pain and shoulder pain.  CT ABDOMEN AND PELVIS WITHOUT CONTRAST  Technique:  Multidetector CT imaging of the abdomen and pelvis was performed following the standard protocol without intravenous contrast.  Comparison: Lumbar spine radiographs performed earlier today at 08:43 p.m.  Findings: Minimal bibasilar atelectasis is noted; the lung bases are not well assessed due to motion artifact.  The liver and spleen are unremarkable in appearance.  There is vicarious excretion of contrast within the gallbladder.  The gallbladder is unremarkable in appearance.  The pancreas and adrenal glands are unremarkable.  The kidneys are unremarkable in appearance.  There is no evidence of hydronephrosis.  No renal or ureteral stones are seen.  No perinephric stranding is appreciated.  No free fluid is identified.  The small bowel  is unremarkable in appearance.  The stomach is within normal limits.  No acute vascular abnormalities are seen.  The appendix is normal in caliber, without evidence for appendicitis.  The colon is unremarkable in appearance.  The bladder is mildly distended and grossly unremarkable in appearance.  The prostate remains normal in size.  No inguinal lymphadenopathy is seen.  No acute osseous abnormalities are  identified.  Mild vacuum phenomenon is noted at the left sacroiliac joint.  IMPRESSION: No acute abnormality seen in the abdomen or pelvis.   Original Report Authenticated By: Tonia Ghent, M.D.   Dg Chest 1 View  07/24/2013   CLINICAL DATA:  Akram Kissick of breath, back pain  EXAM: CHEST - 1 VIEW Unable to obtain lateral view secondary to patient body habitus.  COMPARISON:  Prior chest x-ray 09/12/2012  FINDINGS: Cardiomegaly with left ventricular prominence. Pulmonary vascular congestion without overt edema. No large pleural effusion. No pneumothorax. No acute osseous abnormality.  IMPRESSION: Limited study secondary to patient body habitus and inability to obtain a lateral view.  Mild cardiomegaly with left ventricular prominence and pulmonary vascular congestion without overt edema.   Electronically Signed   By: Malachy Moan M.D.   On: 07/24/2013 23:13   Dg Lumbar Spine Complete  07/24/2013   CLINICAL DATA:  Low back pain  EXAM: LUMBAR SPINE - COMPLETE 4+ VIEW  COMPARISON:  11/12/2005  FINDINGS: Limited mobility for imaging due to pain. Prominent stool throughout the colon favors constipation. Body habitus reduces diagnostic sensitivity and specificity. There is reduced intervertebral disc height at L5-S1. No fracture or acute subluxation in the lumbar spine is observed.  IMPRESSION: 1. No fracture or acute subluxation. 2. Reduced intervertebral disc height at L5-S1 suggesting degenerative disk disease. 3.  Prominent stool throughout the colon favors constipation. 4. Reduced sensitivity due to limited positioning mobility and body habitus.   Electronically Signed   By: Herbie Baltimore M.D.   On: 07/24/2013 21:09   Dg Shoulder Left  07/24/2013   CLINICAL DATA:  Left shoulder pain  EXAM: LEFT SHOULDER - 2+ VIEW  COMPARISON:  CT scan from 07/18/2013  FINDINGS: Suboptimal positioning due to the mobility, body habitus, and patient's discomfort.  Linear lucency in the acromion suggests os acromiale or less  likely acromial fracture. Acromion clavicular distance measured at 1.1 cm, mildly abnormally widened. Negative predictive value of today's exam for shoulder dislocation is low.  IMPRESSION: 1. Potential AC joint widening with suspected os acromiale or acromial fracture. Correlate with point tenderness over the [facility] joint. 2. Reduced sensitivity from difficulty positioning.   Electronically Signed   By: Herbie Baltimore M.D.   On: 07/24/2013 21:14   Dg Foot 2 Views Left  07/24/2013   CLINICAL DATA:  Ulceration at the base of the foot; concern for osteomyelitis.  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 A large ulceration is noted along the plantar surface of the foot, apparently extending to the osseous structures. Underlying osteomyelitis cannot be excluded.  Visualized joint spaces are grossly preserved.  IMPRESSION: Large ulceration along the plantar aspect of the left foot, apparently extending to the osseous structures. Underlying osteomyelitis cannot be excluded. MRI could be considered for further evaluation, when and as deemed clinically appropriate.   Electronically Signed   By: Roanna Raider M.D.   On: 07/24/2013 23:29   Mr Outside Films  Lower Extremity  08/17/2013   This examination belongs to an outside facility and is stored  here for comparison purposes only.  Contact the originating outside  institution for any associated report or interpretation.   Microbiology: Recent Results (from the past 240 hour(s))  MRSA PCR SCREENING     Status: None   Collection Time    08/18/13  2:45 AM      Result Value Range Status   MRSA by PCR NEGATIVE  NEGATIVE Final   Comment:            The GeneXpert MRSA Assay (FDA     approved for NASAL specimens     only), is one component of a     comprehensive MRSA colonization     surveillance program. It is not     intended  to diagnose MRSA     infection nor to guide or     monitor treatment for     MRSA infections.  CULTURE, BLOOD (ROUTINE X 2)     Status: None   Collection Time    08/18/13  3:50 AM      Result Value Range Status   Specimen Description BLOOD RIGHT HAND   Final   Special Requests BOTTLES DRAWN AEROBIC AND ANAEROBIC 10CC   Final   Culture  Setup Time     Final   Value: 08/18/2013 19:05     Performed at [facility]   Culture     Final   Value:        BLOOD CULTURE RECEIVED NO GROWTH TO DATE CULTURE WILL BE HELD FOR 5 DAYS BEFORE ISSUING A FINAL NEGATIVE REPORT     Performed at [facility]   Report Status PENDING   Incomplete  CULTURE, BLOOD (ROUTINE X 2)     Status: None   Collection Time    08/18/13  6:02 AM      Result Value Range Status   Specimen Description BLOOD RIGHT HAND   Final   Special Requests BOTTLES DRAWN AEROBIC ONLY 1 CC   Final   Culture  Setup Time     Final   Value: 08/18/2013 19:04     Performed at [facility]   Culture     Final   Value:        BLOOD CULTURE RECEIVED NO GROWTH TO DATE CULTURE WILL BE HELD FOR 5 DAYS BEFORE ISSUING A FINAL NEGATIVE REPORT     Performed at [facility]   Report Status PENDING   Incomplete  ANAEROBIC CULTURE     Status: None   Collection Time    08/18/13  3:13 PM      Result Value Range Status   Specimen Description ABSCESS LEFT SHOULDER   Final   Special Requests PATIENT ON FOLLOWING VANC ZOSYN CLINDAMYCIN   Final   Gram Stain     Final   Value: FEW WBC PRESENT,BOTH PMN AND MONONUCLEAR     NO SQUAMOUS EPITHELIAL CELLS SEEN     NO ORGANISMS SEEN     Performed at [facility]   Culture     Final   Value: NO ANAEROBES ISOLATED     Performed at [facility]   Report Status 08/23/2013 FINAL   Final  CULTURE, ROUTINE-ABSCESS     Status: None   Collection Time    08/18/13  3:13 PM      Result Value Range Status   Specimen Description ABSCESS LEFT  SHOULDER   Final   Special  Requests PATIENT ON FOLLOWING VANC ZOSYN CLINDAMYCIN   Final   Gram Stain     Final   Value: FEW WBC PRESENT,BOTH PMN AND MONONUCLEAR     NO SQUAMOUS EPITHELIAL CELLS SEEN     NO ORGANISMS SEEN     Performed at [facility]   Culture     Final   Value: NO GROWTH 3 DAYS     Performed at [facility]   Report Status 08/21/2013 FINAL   Final  CULTURE, ROUTINE-ABSCESS     Status: None   Collection Time    08/18/13  3:20 PM      Result Value Range Status   Specimen Description ABSCESS LEFT FOREARM   Final   Special Requests PATIENT ON FOLLOWING VANCOMYCIN ZOSYN CLINDAMYCIN   Final   Gram Stain     Final   Value: MODERATE WBC PRESENT,BOTH PMN AND MONONUCLEAR     NO SQUAMOUS EPITHELIAL CELLS SEEN     NO ORGANISMS SEEN     Performed at [facility]   Culture     Final   Value: NO GROWTH 3 DAYS     Performed at [facility]   Report Status 08/21/2013 FINAL   Final  ANAEROBIC CULTURE     Status: None   Collection Time    08/18/13  3:20 PM      Result Value Range Status   Specimen Description ABSCESS LEFT FOREARM   Final   Special Requests PATIENT ON FOLLOWING VANCOMYCIN ZOSYN CLINDAMYCIN   Final   Gram Stain     Final   Value: FEW WBC PRESENT,BOTH PMN AND MONONUCLEAR     NO SQUAMOUS EPITHELIAL CELLS SEEN     NO ORGANISMS SEEN     Performed at [facility]   Culture     Final   Value: NO ANAEROBES ISOLATED     Performed at [facility]   Report Status 08/23/2013 FINAL   Final     Labs: Basic Metabolic Panel:  Recent Labs Lab 08/17/13 1850 08/18/13 0350 08/20/13 0515 08/22/13 0540  NA 135 136 137 139  K 3.8 3.9 4.2 4.2  CL 106 106 107 110  CO2 21 20 20 20   GLUCOSE 89 108* 153* 136*  BUN 13 14 19  24*  CREATININE 1.19 1.30 1.80* 1.91*  CALCIUM 8.3* 8.2* 7.9* 8.2*   Liver Function Tests: No results found for this basename: AST, ALT, ALKPHOS, BILITOT, PROT, ALBUMIN,  in the last 168 hours No results found for this basename:  LIPASE, AMYLASE,  in the last 168 hours No results found for this basename: AMMONIA,  in the last 168 hours CBC:  Recent Labs Lab 08/17/13 1850 08/18/13 0350 08/20/13 0515 08/22/13 0540  WBC 14.1* 15.3* 20.5* 16.4*  NEUTROABS 9.8*  --  15.6* 12.2*  HGB 7.3* 6.8* 8.4* 8.2*  HCT 23.3* 22.2* 26.4* 26.3*  MCV 84.7 85.4 86.0 87.1  PLT 607* 598* 503* 482*   Cardiac Enzymes: No results found for this basename: CKTOTAL, CKMB, CKMBINDEX, TROPONINI,  in the last 168 hours BNP: BNP (last 3 results) No results found for this basename: PROBNP,  in the last 8760 hours CBG:  Recent Labs Lab 08/22/13 0627 08/22/13 1132 08/22/13 1617 08/22/13 2218 08/23/13 0559  GLUCAP 131* 126* 130* 142* 110*    Time coordinating discharge: 45 minutes  Signed:  Brownie Nehme  [facility] 08/23/2013, 11:55 AM

## 2013-08-23 NOTE — Progress Notes (Signed)
CSW (Clinical Child psychotherapist) informed by pt nurse that pt will need ambulance transport home and is ready for dc. CSW confirmed address with pt and arranged for non-emergent ambulance transport. CSW signing off.  Tallon Gertz, LCSWA [phone]

## 2013-08-23 NOTE — ED Notes (Signed)
This RN spoke with case management, and was told that the company supplying the pt's mattress to home would try and get the mattress delivered tonight. This RN spoke with Jeraldine Loots, MD to update him.

## 2013-08-24 LAB — CULTURE, BLOOD (ROUTINE X 2): Culture: NO GROWTH

## 2013-08-29 ENCOUNTER — Telehealth: Payer: Self-pay | Admitting: *Deleted

## 2013-08-29 NOTE — Telephone Encounter (Signed)
Ok by me to just wait again until next week.

## 2013-08-29 NOTE — Telephone Encounter (Signed)
Erie Noe from [employer] called and advised she was unable to draw labs on the patient yesterday. She advised the patient has a PICC in his R arm and has a procedure on his L shoulder and will not allow her to draw labs from that L arm due to it being to painful. She advised that she only goes out once a week but if Dr Daiva Eves wants her go back to try and get labs she will she just needs a verbal. She advised the patient lives in a very dirty home with trash all around she can not sit or place anything down as there is no where to sit it down. She also advised there is a pit bull in the living room which is where the patient's bed is.  She advised the patient has to use an ambulance to get around to appointments and does not appear to be taken care of very well as he is to much for his mother to handle. Advised her will let Dr Daiva Eves know and will call her back if he wants her to go out and try to redraw the labs on this patient. The patient follow up appt is with Dr Luciana Axe I will forward him this note also.

## 2013-08-29 NOTE — Telephone Encounter (Signed)
I agree. I basically knew he did not have good living conditions. His mom is also one of our pts. Much of this is due to his enormous BMI. I dont think there is much we can do except eventually consider DC IV and do po abx. He needs a gastric bypass

## 2013-08-30 NOTE — Telephone Encounter (Signed)
Called Erie Noe and advised her it is fine to draw patient on Tuesday 09/04/13 and we will see the patient 09/06/13 at his scheduled appt.

## 2013-09-03 ENCOUNTER — Telehealth: Payer: Self-pay | Admitting: *Deleted

## 2013-09-03 ENCOUNTER — Inpatient Hospital Stay: Payer: PRIVATE HEALTH INSURANCE | Admitting: Internal Medicine

## 2013-09-03 NOTE — Telephone Encounter (Signed)
[name] called, stating that [facility] cannot draw labs from pt's PICC.  Per mother, it would be $125 for [facility] to administer cathflow at home - Medicare only covers this at a medical facility.  [facility] does not have cathflow to administer at the patient's upcoming visit.  Patient can go to the ED or Shortstay to have the medication administered and have insurance cover the cost.  RN spoke with the patient's mother, she was not happy as the patient is stretcher bound and transporting him is very difficult.  RN spoke with Melissa at [facility] about payment options for the patient.  She will speak with her manager and contact the patient's mother tomorrow morning.  RN attempted to call back to let her know to expect the phone call, but phone is not in service.   Andree Coss, RN

## 2013-09-06 ENCOUNTER — Ambulatory Visit (INDEPENDENT_AMBULATORY_CARE_PROVIDER_SITE_OTHER): Payer: PRIVATE HEALTH INSURANCE | Admitting: Internal Medicine

## 2013-09-06 ENCOUNTER — Encounter: Payer: Self-pay | Admitting: Internal Medicine

## 2013-09-06 VITALS — BP 166/102 | HR 105 | Temp 98.2°F

## 2013-09-06 DIAGNOSIS — M009 Pyogenic arthritis, unspecified: Secondary | ICD-10-CM

## 2013-09-06 NOTE — Assessment & Plan Note (Signed)
The shoulder seems to getting better. He does have some induration at the site of the incision on his left arm around the biceps. He is going to see the surgeon now after this appointment so we'll continue him on the current antibiotics through December 12 and he will return at that time and reevaluate. If he has any further problems with his PICC line he may be changed to a oral antibiotics with Augmentin.

## 2013-09-06 NOTE — Progress Notes (Signed)
  Subjective:    Patient ID: Adrian Khan, male    DOB: Sep 16, 1976, 37 y.o.   MRN: [id number]  HPI He comes in for followup of his left shoulder and arm infection. He was initially hospitalized and had debridement of his foot and did grow group B streptococcus and his blood and was started on appropriate therapy with IV Rocephin. He did have some swelling of his left shoulder as well however he was too big to have an MRI and do to his morbid obesity was also unable to get an INR drainage to see if there is any infection. He was then sent out however did get an MRI which did show a fluid collection of his left shoulder and came back to the hospital and did undergo operative debridement by Dr. Lajoyce Corners. He was then started on IV antibiotics with a projected six-week course.  He otherwise has had no fever or chills. He does have some pain in the area. His PICC line initially had not worked last week over now it is working in both for infusion and for blood draws. He is at home with his mother who is at the bedside as well. No diarrhea, no rash. PICC line without any issues now.   Review of Systems  Constitutional: Negative for fever and chills.  Eyes: Negative for visual disturbance.  Respiratory: Negative for cough.   Cardiovascular: Negative for leg swelling.  Gastrointestinal: Negative for diarrhea.  Endocrine: Negative for polyuria.  Musculoskeletal: Negative for joint swelling.  Skin: Negative for rash.  Neurological: Negative for dizziness, light-headedness and headaches.  Hematological: Negative for adenopathy.  Psychiatric/Behavioral: Negative for dysphoric mood.       Objective:   Physical Exam  Constitutional: He is oriented to person, place, and time.  Morbidly obese  HENT:  Mouth/Throat: No oropharyngeal exudate.  Eyes: No scleral icterus.  Cardiovascular: Normal rate, regular rhythm and normal heart sounds.   No murmur heard. Musculoskeletal:  PICC line without any erythema  or  Neurological: He is alert and oriented to person, place, and time.  Skin: Skin is warm and dry. No rash noted.  Psychiatric: He has a normal mood and affect. His behavior is normal.          Assessment & Plan:

## 2013-09-07 ENCOUNTER — Other Ambulatory Visit (HOSPITAL_COMMUNITY): Payer: Self-pay | Admitting: Orthopedic Surgery

## 2013-09-18 ENCOUNTER — Encounter (HOSPITAL_COMMUNITY): Payer: Self-pay | Admitting: *Deleted

## 2013-09-18 MED ORDER — CHLORHEXIDINE GLUCONATE 4 % EX LIQD: 60.0000 mL | Freq: Once | CUTANEOUS | Status: DC

## 2013-09-18 MED ORDER — CEFAZOLIN SODIUM 10 G IJ SOLR
3.0000 g | INTRAMUSCULAR | Status: AC
  Administered 2013-09-19: 3 g via INTRAVENOUS
  Filled 2013-09-18: qty 3000

## 2013-09-19 ENCOUNTER — Encounter (HOSPITAL_COMMUNITY)
Admission: RE | Disposition: A | Discharge status: Home / Self Care | Payer: Self-pay | Source: Ambulatory Visit | Attending: Orthopedic Surgery

## 2013-09-19 ENCOUNTER — Encounter (HOSPITAL_COMMUNITY): Payer: PRIVATE HEALTH INSURANCE | Admitting: Certified Registered Nurse Anesthetist

## 2013-09-19 ENCOUNTER — Encounter (HOSPITAL_COMMUNITY): Payer: Self-pay | Admitting: *Deleted

## 2013-09-19 ENCOUNTER — Ambulatory Visit (HOSPITAL_COMMUNITY)
Admission: RE | Admit: 2013-09-19 | Discharge: 2013-09-20 | Disposition: A | Discharge status: Home / Self Care | Payer: PRIVATE HEALTH INSURANCE | Source: Ambulatory Visit | Attending: Orthopedic Surgery | Admitting: Orthopedic Surgery

## 2013-09-19 ENCOUNTER — Inpatient Hospital Stay (HOSPITAL_COMMUNITY): Payer: PRIVATE HEALTH INSURANCE | Admitting: Certified Registered Nurse Anesthetist

## 2013-09-19 DIAGNOSIS — M9080 Osteopathy in diseases classified elsewhere, unspecified site: Secondary | ICD-10-CM | POA: Insufficient documentation

## 2013-09-19 DIAGNOSIS — E1149 Type 2 diabetes mellitus with other diabetic neurological complication: Secondary | ICD-10-CM | POA: Insufficient documentation

## 2013-09-19 DIAGNOSIS — E1142 Type 2 diabetes mellitus with diabetic polyneuropathy: Secondary | ICD-10-CM | POA: Insufficient documentation

## 2013-09-19 DIAGNOSIS — M869 Osteomyelitis, unspecified: Secondary | ICD-10-CM | POA: Diagnosis present

## 2013-09-19 DIAGNOSIS — E1169 Type 2 diabetes mellitus with other specified complication: Principal | ICD-10-CM | POA: Insufficient documentation

## 2013-09-19 DIAGNOSIS — L97509 Non-pressure chronic ulcer of other part of unspecified foot with unspecified severity: Secondary | ICD-10-CM | POA: Insufficient documentation

## 2013-09-19 HISTORY — DX: Personal history of Methicillin resistant Staphylococcus aureus infection: Z86.14

## 2013-09-19 HISTORY — PX: AMPUTATION: SHX166

## 2013-09-19 HISTORY — DX: Obesity, unspecified: E66.9

## 2013-09-19 LAB — GLUCOSE, CAPILLARY
Glucose-Capillary: 107 mg/dL — ABNORMAL HIGH (ref 70–99)
Glucose-Capillary: 112 mg/dL — ABNORMAL HIGH (ref 70–99)
Glucose-Capillary: 119 mg/dL — ABNORMAL HIGH (ref 70–99)
Glucose-Capillary: 120 mg/dL — ABNORMAL HIGH (ref 70–99)

## 2013-09-19 LAB — CBC
Hemoglobin: 8.5 g/dL — ABNORMAL LOW (ref 13.0–17.0)
MCH: 26.8 pg (ref 26.0–34.0)
MCV: 84.5 fL (ref 78.0–100.0)
Platelets: 356 10*3/uL (ref 150–400)
RBC: 3.17 MIL/uL — ABNORMAL LOW (ref 4.22–5.81)
RDW: 14.4 % (ref 11.5–15.5)
WBC: 12.9 10*3/uL — ABNORMAL HIGH (ref 4.0–10.5)

## 2013-09-19 LAB — COMPREHENSIVE METABOLIC PANEL
ALT: <5 U/L (ref 0–53)
AST: 6 U/L (ref 0–37)
Albumin: 1.8 g/dL — ABNORMAL LOW (ref 3.5–5.2)
CO2: 21 mEq/L (ref 19–32)
Chloride: 107 mEq/L (ref 96–112)
Creatinine, Ser: 1.04 mg/dL (ref 0.50–1.35)
GFR calc non Af Amer: >90 mL/min (ref >90)
Potassium: 3.7 mEq/L (ref 3.5–5.1)
Sodium: 139 mEq/L (ref 135–145)
Total Bilirubin: 0.2 mg/dL — ABNORMAL LOW (ref 0.3–1.2)

## 2013-09-19 LAB — PROTIME-INR: INR: 1.12 (ref 0.00–1.49)

## 2013-09-19 SURGERY — AMPUTATION, FOOT, RAY
Anesthesia: General | Site: Foot | Laterality: Right

## 2013-09-19 MED ORDER — ARTIFICIAL TEARS OP OINT
TOPICAL_OINTMENT | OPHTHALMIC | Status: DC | PRN
  Administered 2013-09-19: 1 via OPHTHALMIC

## 2013-09-19 MED ORDER — INSULIN ASPART 100 UNIT/ML ~~LOC~~ SOLN: 0.0000 [IU] | Freq: Three times a day (TID) | SUBCUTANEOUS | Status: DC

## 2013-09-19 MED ORDER — SODIUM CHLORIDE 0.9 % IV SOLN: INTRAVENOUS | Status: DC

## 2013-09-19 MED ORDER — PROPOFOL 10 MG/ML IV BOLUS
INTRAVENOUS | Status: DC | PRN
  Administered 2013-09-19: 200 mg via INTRAVENOUS

## 2013-09-19 MED ORDER — 0.9 % SODIUM CHLORIDE (POUR BTL) OPTIME
TOPICAL | Status: DC | PRN
  Administered 2013-09-19: 1000 mL

## 2013-09-19 MED ORDER — ONDANSETRON HCL 4 MG PO TABS: 4.0000 mg | ORAL_TABLET | Freq: Four times a day (QID) | ORAL | Status: DC | PRN

## 2013-09-19 MED ORDER — ONDANSETRON HCL 4 MG/2ML IJ SOLN
INTRAMUSCULAR | Status: AC
  Administered 2013-09-19: 4 mg via INTRAVENOUS
  Filled 2013-09-19: qty 2

## 2013-09-19 MED ORDER — FENTANYL CITRATE 0.05 MG/ML IJ SOLN
INTRAMUSCULAR | Status: DC | PRN
  Administered 2013-09-19 (×2): 50 ug via INTRAVENOUS

## 2013-09-19 MED ORDER — LIDOCAINE HCL (CARDIAC) 20 MG/ML IV SOLN
INTRAVENOUS | Status: DC | PRN
  Administered 2013-09-19: 100 mg via INTRAVENOUS

## 2013-09-19 MED ORDER — METOCLOPRAMIDE HCL 10 MG PO TABS: 5.0000 mg | ORAL_TABLET | Freq: Three times a day (TID) | ORAL | Status: DC | PRN

## 2013-09-19 MED ORDER — LINAGLIPTIN 5 MG PO TABS
5.0000 mg | ORAL_TABLET | Freq: Every day | ORAL | Status: DC
  Administered 2013-09-19: 5 mg via ORAL
  Filled 2013-09-19 (×2): qty 1

## 2013-09-19 MED ORDER — SUCCINYLCHOLINE CHLORIDE 20 MG/ML IJ SOLN
INTRAMUSCULAR | Status: DC | PRN
  Administered 2013-09-19: 180 mg via INTRAVENOUS

## 2013-09-19 MED ORDER — ONDANSETRON HCL 4 MG/2ML IJ SOLN
INTRAMUSCULAR | Status: DC | PRN
  Administered 2013-09-19: 4 mg via INTRAVENOUS

## 2013-09-19 MED ORDER — METOCLOPRAMIDE HCL 5 MG/ML IJ SOLN: 5.0000 mg | Freq: Three times a day (TID) | INTRAMUSCULAR | Status: DC | PRN

## 2013-09-19 MED ORDER — OXYCODONE-ACETAMINOPHEN 5-325 MG PO TABS: 1.0000 | ORAL_TABLET | ORAL | Status: DC | PRN

## 2013-09-19 MED ORDER — SENNOSIDES-DOCUSATE SODIUM 8.6-50 MG PO TABS: 1.0000 | ORAL_TABLET | Freq: Two times a day (BID) | ORAL | Status: DC | PRN

## 2013-09-19 MED ORDER — MORPHINE SULFATE ER 15 MG PO TBCR
30.0000 mg | EXTENDED_RELEASE_TABLET | Freq: Two times a day (BID) | ORAL | Status: DC
  Administered 2013-09-19 – 2013-09-20 (×3): 30 mg via ORAL
  Filled 2013-09-19 (×3): qty 2

## 2013-09-19 MED ORDER — ASPIRIN EC 325 MG PO TBEC
325.0000 mg | DELAYED_RELEASE_TABLET | Freq: Every day | ORAL | Status: DC
  Administered 2013-09-19 – 2013-09-20 (×2): 325 mg via ORAL
  Filled 2013-09-19 (×2): qty 1

## 2013-09-19 MED ORDER — ONDANSETRON HCL 4 MG/2ML IJ SOLN
4.0000 mg | Freq: Once | INTRAMUSCULAR | Status: AC | PRN
  Administered 2013-09-19: 4 mg via INTRAVENOUS

## 2013-09-19 MED ORDER — HYDRALAZINE HCL 25 MG PO TABS
25.0000 mg | ORAL_TABLET | Freq: Three times a day (TID) | ORAL | Status: DC
  Administered 2013-09-19 – 2013-09-20 (×4): 25 mg via ORAL
  Filled 2013-09-19 (×5): qty 1

## 2013-09-19 MED ORDER — ONDANSETRON HCL 4 MG/2ML IJ SOLN: 4.0000 mg | Freq: Four times a day (QID) | INTRAMUSCULAR | Status: DC | PRN

## 2013-09-19 MED ORDER — OXYCODONE-ACETAMINOPHEN 5-325 MG PO TABS
1.0000 | ORAL_TABLET | ORAL | Status: DC | PRN
  Administered 2013-09-19 – 2013-09-20 (×3): 2 via ORAL
  Filled 2013-09-19 (×3): qty 2

## 2013-09-19 MED ORDER — LACTATED RINGERS IV SOLN
INTRAVENOUS | Status: DC | PRN
  Administered 2013-09-19: 11:00:00 via INTRAVENOUS

## 2013-09-19 MED ORDER — HYDROMORPHONE HCL PF 1 MG/ML IJ SOLN: 0.5000 mg | INTRAMUSCULAR | Status: DC | PRN

## 2013-09-19 MED ORDER — HYDROMORPHONE HCL PF 1 MG/ML IJ SOLN: 0.2500 mg | INTRAMUSCULAR | Status: DC | PRN

## 2013-09-19 MED ORDER — CEFAZOLIN SODIUM-DEXTROSE 2-3 GM-% IV SOLR
2.0000 g | Freq: Four times a day (QID) | INTRAVENOUS | Status: AC
  Administered 2013-09-19 – 2013-09-20 (×3): 2 g via INTRAVENOUS
  Filled 2013-09-19 (×3): qty 50

## 2013-09-19 SURGICAL SUPPLY — 39 items
BANDAGE ESMARK 6X9 LF (GAUZE/BANDAGES/DRESSINGS) IMPLANT
BANDAGE GAUZE ELAST BULKY 4 IN (GAUZE/BANDAGES/DRESSINGS) ×2 IMPLANT
BLADE SAW SGTL MED 73X18.5 STR (BLADE) IMPLANT
BNDG COHESIVE 4X5 TAN STRL (GAUZE/BANDAGES/DRESSINGS) ×2 IMPLANT
BNDG COHESIVE 6X5 TAN STRL LF (GAUZE/BANDAGES/DRESSINGS) ×2 IMPLANT
BNDG ESMARK 6X9 LF (GAUZE/BANDAGES/DRESSINGS)
BNDG GAUZE ELAST 4 BULKY (GAUZE/BANDAGES/DRESSINGS) ×2 IMPLANT
CLOTH BEACON ORANGE TIMEOUT ST (SAFETY) ×2 IMPLANT
CUFF TOURNIQUET SINGLE 34IN LL (TOURNIQUET CUFF) IMPLANT
CUFF TOURNIQUET SINGLE 44IN (TOURNIQUET CUFF) IMPLANT
DRAPE U-SHAPE 47X51 STRL (DRAPES) ×4 IMPLANT
DRSG ADAPTIC 3X8 NADH LF (GAUZE/BANDAGES/DRESSINGS) ×2 IMPLANT
DRSG PAD ABDOMINAL 8X10 ST (GAUZE/BANDAGES/DRESSINGS) ×2 IMPLANT
DURAPREP 26ML APPLICATOR (WOUND CARE) ×2 IMPLANT
ELECT REM PT RETURN 9FT ADLT (ELECTROSURGICAL) ×2
ELECTRODE REM PT RTRN 9FT ADLT (ELECTROSURGICAL) ×1 IMPLANT
GLOVE BIOGEL PI IND STRL 9 (GLOVE) ×1 IMPLANT
GLOVE BIOGEL PI INDICATOR 9 (GLOVE) ×1
GLOVE SURG ORTHO 9.0 STRL STRW (GLOVE) ×2 IMPLANT
GOWN PREVENTION PLUS XLARGE (GOWN DISPOSABLE) ×2 IMPLANT
GOWN SRG XL XLNG 56XLVL 4 (GOWN DISPOSABLE) ×1 IMPLANT
GOWN STRL NON-REIN XL XLG LVL4 (GOWN DISPOSABLE) ×2
KIT BASIN OR (CUSTOM PROCEDURE TRAY) ×2 IMPLANT
KIT ROOM TURNOVER OR (KITS) ×2 IMPLANT
MANIFOLD NEPTUNE II (INSTRUMENTS) ×2 IMPLANT
NS IRRIG 1000ML POUR BTL (IV SOLUTION) ×2 IMPLANT
PACK ORTHO EXTREMITY (CUSTOM PROCEDURE TRAY) ×2 IMPLANT
PAD ARMBOARD 7.5X6 YLW CONV (MISCELLANEOUS) ×4 IMPLANT
PAD CAST 4YDX4 CTTN HI CHSV (CAST SUPPLIES) ×1 IMPLANT
PADDING CAST COTTON 4X4 STRL (CAST SUPPLIES) ×1
SPONGE GAUZE 4X4 12PLY (GAUZE/BANDAGES/DRESSINGS) ×2 IMPLANT
SPONGE LAP 18X18 X RAY DECT (DISPOSABLE) ×2 IMPLANT
STOCKINETTE IMPERVIOUS LG (DRAPES) IMPLANT
SUT ETHILON 2 0 PSLX (SUTURE) ×4 IMPLANT
TOWEL OR 17X24 6PK STRL BLUE (TOWEL DISPOSABLE) ×2 IMPLANT
TOWEL OR 17X26 10 PK STRL BLUE (TOWEL DISPOSABLE) ×2 IMPLANT
TUBE CONNECTING 12X1/4 (SUCTIONS) ×2 IMPLANT
UNDERPAD 30X30 INCONTINENT (UNDERPADS AND DIAPERS) ×2 IMPLANT
WATER STERILE IRR 1000ML POUR (IV SOLUTION) ×2 IMPLANT

## 2013-09-19 NOTE — Op Note (Signed)
OPERATIVE REPORT  DATE OF SURGERY: 09/19/2013  PATIENT:  Adrian Khan,  37 y.o. male  PRE-OPERATIVE DIAGNOSIS:  Osteomyelitis Right Foot 5th Metatarsal  POST-OPERATIVE DIAGNOSIS:  Osteomyelitis Right Foot 5th Metatarsal  PROCEDURE:  Procedure(s): AMPUTATION RAY- right foot fifth ray  SURGEON:  Surgeon(s): Nadara Mustard, MD  ANESTHESIA:   general  EBL:  min ML  SPECIMEN:  Source of Specimen:  Left foot fifth toe  TOURNIQUET:  * No tourniquets in log *  PROCEDURE DETAILS: Patient is a 37 year old gentleman with diabetic insensate neuropathy status post midfoot dictation left status post irrigation debridement of an abscess the left arm and left shoulder and presents at this time with abscess ulcer osteomyelitis of the fifth metatarsal head right foot. Patient still conservative treatment and presents at this time for surgical intervention. Risks and benefits were discussed including infection nonhealing of the wound need for higher level amputation. Patient states he understands and wished to proceed at this time. Description of procedure patient was brought to the operating room and underwent a general anesthetic. After adequate levels and anesthesia obtained patient was maintained on his stretcher in the right lower extremity was prepped using DuraPrep and draped into a sterile field. Patient was too large transfer to the operating room table. A racquet incision was made around the ulcer and the toe the metatarsal in ulcerative tissue were resected in one block of tissue. The wound was irrigated with normal saline hemostasis was obtained. The wound was covered with Adaptic orthopedic sponges AB dressing Kerlix and Coban. Patient was extubated taken the PACU in stable condition plan for overnight observation  PLAN OF CARE: Admit for overnight observation  PATIENT DISPOSITION:  PACU - hemodynamically stable.   Nadara Mustard, MD 09/19/2013 11:16 AM

## 2013-09-19 NOTE — Transfer of Care (Signed)
Immediate Anesthesia Transfer of Care Note  Patient: Adrian Khan  Procedure(s) Performed: Procedure(s) with comments: AMPUTATION RAY- right (Right) - Right Foot 5th Ray Amputation  Patient Location: PACU  Anesthesia Type:General  Level of Consciousness: awake, alert , oriented and patient cooperative  Airway & Oxygen Therapy: Patient Spontanous Breathing and Patient connected to face mask oxygen  Post-op Assessment: Report given to PACU RN, Post -op Vital signs reviewed and stable and Patient moving all extremities X 4  Post vital signs: Reviewed and stable  Complications: No apparent anesthesia complications

## 2013-09-19 NOTE — H&P (Signed)
Adrian Khan is an 37 y.o. male.   Chief Complaint: Osteomyelitis abscess right foot fifth metatarsal head HPI: Patient is a 37 year old gentleman with diabetic insensate neuropathy morbid obesity who has developed chronic ulceration of the fifth metatarsal head right foot. Patient has failed conservative care has developed osteomyelitis and presents at this time for fifth ray amputation.  Past Medical History  Diagnosis Date  . Shortness of breath     with exertion  . PONV (postoperative nausea and vomiting)   . Diabetes mellitus without complication   . Anxiety   . Mental disorder   . Depression   . Sleep apnea     done at [facility].  Marland Kitchen Pneumonia   . GERD (gastroesophageal reflux disease)   . Constipation   . Neuropathy     feet, hands  . Anginal pain     chest pressure has increased in the past few days; patient denied  on 09/20/12    Past Surgical History  Procedure Laterality Date  . I&d extremity  12/22/2011    Procedure: IRRIGATION AND DEBRIDEMENT EXTREMITY;  Surgeon: Kennieth Rad, MD;  Location: [facility] OR;  Service: Orthopedics;  Laterality: Left;  . I&d extremity  12/24/2011    Procedure: IRRIGATION AND DEBRIDEMENT EXTREMITY;  Surgeon: Kennieth Rad, MD;  Location: [facility] OR;  Service: Orthopedics;  Laterality: Left;  Irrigation and debridement left foot.  . I&d extremity  12/26/2011    Procedure: IRRIGATION AND DEBRIDEMENT EXTREMITY;  Surgeon: Kennieth Rad, MD;  Location: [facility] OR;  Service: Orthopedics;  Laterality: Left;  . I&d extremity  12/28/2011    Procedure: IRRIGATION AND DEBRIDEMENT EXTREMITY;  Surgeon: Kennieth Rad, MD;  Location: [facility] OR;  Service: Orthopedics;  Laterality: Left;  I&D Left Foot;Application of  Wound Vac.to dorsal and plantar areas of left foot  . Eye surgery    . Toe surgery      Bone removed- 2 toe right  . Wisdom tooth extraction    . Amputation  09/22/2012    Procedure: AMPUTATION FOOT;  Surgeon: Nadara Mustard, MD;  Location: [facility] OR;  Service:  Orthopedics;  Laterality: Left;  Left Midfoot Amputation  . Shoulder arthroscopy Left 08/18/2013    Procedure: ARTHROSCOPY SHOULDER and subacromial decompresion and debreidment of abcess;  Surgeon: Nadara Mustard, MD;  Location: [facility] OR;  Service: Orthopedics;  Laterality: Left;    History reviewed. No pertinent family history. Social History:  reports that he has never smoked. He has never used smokeless tobacco. He reports that he does not drink alcohol or use illicit drugs.  Allergies:  Allergies  Allergen Reactions  . Sulfonamide Derivatives Other (See Comments)    cramps    No prescriptions prior to admission    No results found for this or any previous visit (from the past 48 hour(s)). No results found.  Review of Systems  All other systems reviewed and are negative.    Height 5\' 11"  (1.803 m), weight 404 lb (183.253 kg). Physical Exam  Examination patient has ulceration that extends down to bone fifth metatarsal head right foot. Assessment/Plan Assessment: Osteomyelitis abscess ulceration with diabetic insensate neuropathy right foot fifth metatarsal head.  Plan: Will plan for a fifth ray amputation. Risks and benefits were discussed including infection neurovascular injury nonhealing of the wound need for additional surgery. Patient states he understands and wished to proceed at this time.  Kenny Rea V 09/19/2013, 6:19 AM

## 2013-09-19 NOTE — Progress Notes (Signed)
[facility]  Patient Status: Active pt with [facility] up to this readmission  [facility] is providing the following services: HHRN, Home [facility], OT, PT, HA, OS, MS.  [facility] hospital team will follow pt and support transition to home when deemed appropriate by MD team.   If patient discharges after hours, please call [phone].   Sedalia Muta 09/19/2013, 5:57 PM

## 2013-09-19 NOTE — Anesthesia Preprocedure Evaluation (Signed)
Anesthesia Evaluation  Patient identified by MRN, date of birth, ID band Patient awake    Reviewed: Allergy & Precautions, H&P , NPO status , Patient's Chart, lab work & pertinent test results  Airway Mallampati: II TM Distance: >3 FB   Mouth opening: Limited Mouth Opening  Dental  (+) Teeth Intact and Dental Advisory Given   Pulmonary    + decreased breath sounds      Cardiovascular Rhythm:Regular Rate:Normal     Neuro/Psych    GI/Hepatic   Endo/Other    Renal/GU      Musculoskeletal   Abdominal (+) + obese,   Peds  Hematology   Anesthesia Other Findings   Reproductive/Obstetrics                           Anesthesia Physical Anesthesia Plan  ASA: III  Anesthesia Plan: General   Post-op Pain Management:    Induction: Intravenous  Airway Management Planned: Oral ETT  Additional Equipment:   Intra-op Plan:   Post-operative Plan: Extubation in OR  Informed Consent: I have reviewed the patients History and Physical, chart, labs and discussed the procedure including the risks, benefits and alternatives for the proposed anesthesia with the patient or authorized representative who has indicated his/her understanding and acceptance.   Dental advisory given  Plan Discussed with: CRNA and Anesthesiologist  Anesthesia Plan Comments: (Osteomyelitis R. Foot Morbid Obesity Type 2 DM glucose 119 Htn Anemia H/H 8.5/26.8  Plan GA with oral ETT  Kipp Brood, MD    Plan GA with oral ETT)        Anesthesia Quick Evaluation

## 2013-09-19 NOTE — Progress Notes (Signed)
Patient refused medications at scheduled times d/t being "too sleepy".  Given at a later time.

## 2013-09-19 NOTE — Preoperative (Signed)
Beta Blockers   Reason not to administer Beta Blockers:Not Applicable 

## 2013-09-20 ENCOUNTER — Encounter (HOSPITAL_COMMUNITY): Payer: Self-pay | Admitting: General Practice

## 2013-09-20 LAB — GLUCOSE, CAPILLARY: Glucose-Capillary: 94 mg/dL (ref 70–99)

## 2013-09-20 MED ORDER — HEPARIN SOD (PORK) LOCK FLUSH 100 UNIT/ML IV SOLN: 250.0000 [IU] | INTRAVENOUS | Status: DC | PRN

## 2013-09-20 MED ORDER — SODIUM CHLORIDE 0.9 % IJ SOLN: 10.0000 mL | Freq: Two times a day (BID) | INTRAMUSCULAR | Status: DC

## 2013-09-20 MED ORDER — SODIUM CHLORIDE 0.9 % IJ SOLN
10.0000 mL | INTRAMUSCULAR | Status: DC | PRN
  Administered 2013-09-20: 10 mL

## 2013-09-20 MED ORDER — HEPARIN SOD (PORK) LOCK FLUSH 100 UNIT/ML IV SOLN
250.0000 [IU] | INTRAVENOUS | Status: AC | PRN
  Administered 2013-09-20: 500 [IU]

## 2013-09-20 NOTE — Progress Notes (Signed)
PT Cancellation Note  Patient Details Name: Adrian Khan MRN: [id number] DOB: 1976-03-25   Cancelled Treatment:    Reason Eval/Treat Not Completed: Fatigue/lethargy limiting ability to participate (Nausea); patient reports plans on ambulance ride home.  Has HHPT already in place prior to admission.  Recommend resume HHPT at d/c.  Seems as if he has needed equipment (3:1, RW and power w/c); may need to get ramped entry into home.   Kaeleen Odom,CYNDI 09/20/2013, 9:05 AM

## 2013-09-20 NOTE — Care Management Note (Deleted)
Patient requesting ambulance ride home. Social worker is aware. Patient is active with [facility] will resume at discharge. Case Manager faxed orders for wound vac to [facility] with [email or web address]@ [phone]. Vac will be delivered to patient's room. Vance Peper, RN BSN

## 2013-09-20 NOTE — Anesthesia Postprocedure Evaluation (Signed)
  Anesthesia Post-op Note  Patient: Adrian Khan  Procedure(s) Performed: Procedure(s) with comments: AMPUTATION RAY- right (Right) - Right Foot 5th Ray Amputation  Patient Location: PACU  Anesthesia Type:General  Level of Consciousness: awake, alert  and oriented  Airway and Oxygen Therapy: Patient Spontanous Breathing  Post-op Pain: mild  Post-op Assessment: Post-op Vital signs reviewed, Patient's Cardiovascular Status Stable, Respiratory Function Stable, Patent Airway and Pain level controlled  Post-op Vital Signs: stable  Complications: No apparent anesthesia complications

## 2013-09-20 NOTE — Care Management Note (Signed)
Patient requesting ambulance to transport home. Social worker notified. Patient will discharge after mom calls to notify nurse that bed has been delivered to home. Vance Peper, RN BSN

## 2013-09-20 NOTE — Discharge Summary (Signed)
Physician Discharge Summary  Patient ID: Adrian Khan MRN: [id number] DOB/AGE: 1976/02/20 37 y.o.  Admit date: 09/19/2013 Discharge date: 09/20/2013  Admission Diagnoses: Osteomyelitis abscess right foot fifth metatarsal head  Discharge Diagnoses:  Active Problems:   Osteomyelitis of toe of right foot   Discharged Condition: stable  Hospital Course: Patient's hospital course was essentially unremarkable. He underwent a right foot fifth ray amputation. Postoperatively patient progressed well and was discharged to home in stable condition.  Consults: None  Significant Diagnostic Studies: labs: Routine labs  Treatments: surgery: See operative note  Discharge Exam: Blood pressure 145/76, pulse 78, temperature 98.5 F (36.9 C), temperature source Oral, resp. rate 18, height 5\' 11"  (1.803 m), weight 183.253 kg (404 lb), SpO2 98.00%. Incision/Wound: dressing clean dry and intact  Disposition: 01-Home or Self Care  Discharge Orders   Future Appointments Provider Department Dept Phone   09/27/2013 11:15 AM Randall Hiss, MD [facility] for Infectious Disease [phone]   Future Orders Complete By Expires   Call MD / Call 911  As directed    Comments:     If you experience chest pain or shortness of breath, CALL 911 and be transported to the hospital emergency room.  If you develope a fever above 101 F, pus (white drainage) or increased drainage or redness at the wound, or calf pain, call your surgeon's office.   Constipation Prevention  As directed    Comments:     Drink plenty of fluids.  Prune juice may be helpful.  You may use a stool softener, such as Colace (over the counter) 100 mg twice a day.  Use MiraLax (over the counter) for constipation as needed.   Diet - low sodium heart healthy  As directed    Increase activity slowly as tolerated  As directed        Medication List         cefTRIAXone 2 G injection  Commonly known as:  ROCEPHIN  2 g  daily. 2 gm in 20 ml of sterile water. Give IV push over 2 minutes.     hydrALAZINE 25 MG tablet  Commonly known as:  APRESOLINE  Take 1 tablet (25 mg total) by mouth 3 (three) times daily.     morphine 30 MG 12 hr tablet  Commonly known as:  MS CONTIN  Take 1 tablet (30 mg total) by mouth every 12 (twelve) hours.     oxyCODONE-acetaminophen 5-325 MG per tablet  Commonly known as:  PERCOCET/ROXICET  Take 1-2 tablets by mouth every 4 (four) hours as needed for moderate pain.     senna-docusate 8.6-50 MG per tablet  Commonly known as:  Senokot-S  Take 1 tablet by mouth 2 (two) times daily as needed for constipation.     silver sulfADIAZINE 1 % cream  Commonly known as:  SILVADENE  Apply 1 application topically daily.     sitaGLIPtin 100 MG tablet  Commonly known as:  JANUVIA  Take 100 mg by mouth daily.           Follow-up Information   Follow up with DUDA,MARCUS V, MD In 1 week.   Specialty:  Orthopedic Surgery   Contact information:   [address] [place] [address] [phone]       Signed: Nadara Mustard 09/20/2013, 6:36 AM

## 2013-09-21 ENCOUNTER — Encounter (HOSPITAL_COMMUNITY): Payer: Self-pay | Admitting: Orthopedic Surgery

## 2013-09-27 ENCOUNTER — Ambulatory Visit: Payer: PRIVATE HEALTH INSURANCE | Admitting: Infectious Disease

## 2013-09-28 ENCOUNTER — Telehealth: Payer: Self-pay | Admitting: *Deleted

## 2013-09-28 NOTE — Telephone Encounter (Signed)
Per Dr. Daiva Eves patient can have picc pulled today and antibiotics stopped.

## 2013-09-28 NOTE — Telephone Encounter (Signed)
Call from [facility], Facilities manager with [facility]; patients IV antibiotics have ended today. He had an appt yesterday with Dr. Daiva Eves and no showed. OT and PT are discharging patient today due to lack of progress. Patient has been offered PCS services and refused. Social Worker with [facility] feels patient's Mom is unable to adequately care for patient and has suggested a skilled nursing facility but mother has refused this also. Are IV antibiotics to be continued or is patient going to start oral antibiotics; and if IV is stop can they have an order to pull picc line? Call back [place] [phone], ext [phone]. Please respond to triage nurse pool as I will not be here this afternoon. Wendall Mola

## 2013-10-01 NOTE — Telephone Encounter (Signed)
Per Rene Kocher at [facility] patient's mother refused the nurse visit to pull the picc. She said he had a visit at Dr. Audrie Lia office this week and wanted it left in until then. [facility] explained that they could not take an order on the picc line from Dr. Lajoyce Corners because he was not the MD who ordered it. Wendall Mola CMA

## 2013-10-01 NOTE — Telephone Encounter (Signed)
Ok, it should be taken out as soon as possible.  thanks

## 2013-10-02 ENCOUNTER — Ambulatory Visit: Payer: PRIVATE HEALTH INSURANCE | Admitting: Infectious Disease

## 2013-10-02 ENCOUNTER — Encounter: Payer: Self-pay | Admitting: Infectious Disease

## 2013-10-03 ENCOUNTER — Ambulatory Visit (INDEPENDENT_AMBULATORY_CARE_PROVIDER_SITE_OTHER): Payer: PRIVATE HEALTH INSURANCE | Admitting: Infectious Disease

## 2013-10-03 ENCOUNTER — Encounter: Payer: Self-pay | Admitting: Infectious Disease

## 2013-10-03 DIAGNOSIS — E11621 Type 2 diabetes mellitus with foot ulcer: Secondary | ICD-10-CM

## 2013-10-03 DIAGNOSIS — E119 Type 2 diabetes mellitus without complications: Secondary | ICD-10-CM

## 2013-10-03 DIAGNOSIS — M009 Pyogenic arthritis, unspecified: Secondary | ICD-10-CM

## 2013-10-03 DIAGNOSIS — A491 Streptococcal infection, unspecified site: Secondary | ICD-10-CM

## 2013-10-03 DIAGNOSIS — R7881 Bacteremia: Secondary | ICD-10-CM

## 2013-10-03 DIAGNOSIS — M86179 Other acute osteomyelitis, unspecified ankle and foot: Secondary | ICD-10-CM

## 2013-10-03 DIAGNOSIS — E1169 Type 2 diabetes mellitus with other specified complication: Secondary | ICD-10-CM

## 2013-10-03 DIAGNOSIS — M60009 Infective myositis, unspecified site: Secondary | ICD-10-CM

## 2013-10-03 DIAGNOSIS — B951 Streptococcus, group B, as the cause of diseases classified elsewhere: Secondary | ICD-10-CM

## 2013-10-03 DIAGNOSIS — L97509 Non-pressure chronic ulcer of other part of unspecified foot with unspecified severity: Secondary | ICD-10-CM

## 2013-10-03 DIAGNOSIS — M86171 Other acute osteomyelitis, right ankle and foot: Secondary | ICD-10-CM

## 2013-10-03 MED ORDER — AMOXICILLIN-POT CLAVULANATE 875-125 MG PO TABS: 1.0000 | ORAL_TABLET | Freq: Two times a day (BID) | ORAL | Status: DC

## 2013-10-03 NOTE — Progress Notes (Signed)
Subjective:    Patient ID: Adrian Khan, male    DOB: 06/02/1976, 37 y.o.   MRN: [id number]  HPI  37 y.o. male whom I followed at [facility] for Group B streptococcal bacteremia with left sided shoulder pain and chronic ulcer at site of amputation on the left. At the time attempts to aspirate the shoulder by IR were compromised by his size as were imaging such as MRI and Orthopedics at that time were not convinced for septic arthritis. Ultimately pt was DC with IV rocephin and finally an MRI performed at [facility] that showed large shoulder effusion with multiple small abscesses and pymyositis along with abscess in biceps muscle. Pt was taken to the OR on [id number] by Dr. Lajoyce Corners and had I &D of the left shoulder and left arm. I had narrowed him to ceftriaxone postoperatively and I plan on giving him a six-week postoperative course. Ultimately was discharged the hospital but unfortunately his living conditions at home were not conducive to him continuing to receive the IV antibiotics with his housing apparently being very unclean. His IV antibiotics were stopped and his PICC line was stopped I've wanted him on oral antibiotics this was apparently never done. He had previously had a bad wound in his right foot as well and apparently he developed progression of infection in the foot with now Osteomyelitis abscess right foot fifth metatarsal head. He underwent right foot fifth ray and dictation on December 3 by Dr. Lajoyce Corners. He was discharged to home and is now being seen by [place] in followup. Since he was in the hospital he is continue to complain of pain in his biceps and still has some purulent discharge from arm.   He still remains largely bed bound and was brought in via stretcher for examination in the clinic.      Review of Systems  Constitutional: Positive for fatigue. Negative for fever, chills, diaphoresis, activity change, appetite change and unexpected weight change.  Eyes: Negative for photophobia and  visual disturbance.  Respiratory: Negative for cough and shortness of breath.   Cardiovascular: Negative for palpitations and leg swelling.  Gastrointestinal: Negative for nausea, vomiting, abdominal pain, diarrhea and constipation.  Musculoskeletal: Positive for arthralgias, back pain and myalgias.  Skin: Positive for wound.  Neurological: Positive for weakness. Negative for dizziness, tremors and light-headedness.  Psychiatric/Behavioral: Negative for behavioral problems, confusion, sleep disturbance, dysphoric mood, decreased concentration and agitation.       Objective:   Physical Exam  Constitutional: He is oriented to person, place, and time. He appears well-nourished. No distress.  HENT:  Head: Normocephalic and atraumatic.  Mouth/Throat: Oropharynx is clear and moist.  Eyes: Conjunctivae and EOM are normal.  Neck: Normal range of motion. Neck supple.  Cardiovascular: Normal rate and regular rhythm.  Exam reveals no friction rub.   Pulmonary/Chest: Effort normal. No respiratory distress. He has no wheezes.  Abdominal: Soft. He exhibits no distension.  Musculoskeletal: He exhibits edema.  Neurological: He is alert and oriented to person, place, and time. He exhibits normal muscle tone.  Skin: Skin is warm and dry. He is not diaphoretic. There is erythema. No pallor.  Psychiatric: He has a normal mood and affect. His behavior is normal. Judgment and thought content normal.   Soft tissue from proximal upper arm with purulence and induration      Distal upper arm also with induration, arm weak grossly  Right foot with sutures, some bleeding from proximal edge of wound  Assessment & Plan:   #1 Septic shoulder due to Group B strep (presumption made upon the fact that the patient was bacteremic when he first presented with this infected shoulder:  He is going to have an outpatient open MRI to reevaluate the shoulder and upper arm where he does have some  purulence coming through. I expect he will unfortunately need further exploration of that wound.  For now I like him to be on oral antibiotic, place him on Augmentin twice daily and have given him a month supply with refills.  I spent greater than 25 minutes with the patient including greater than 50% of time in face to face counsel of the patient and in coordination of their care.  #2 diabetic foot infection with osteomyelitis status post amputation: Will have him on Augmentin as mentioned the needle hopefully take good care of the wound and followup vigilantly with Dr. Lajoyce Corners.  #3 group B strep bacteremia: This is been more than adequately treated.  #4 morbid obesity: This is a normocytic issue for this patient he states that he and his mother been eating less carbohydrates fats  and more vegetables.  #5 diabetes mellitus he states is no longer on insulin or diabetic medications that his sugars have been well controlled with the diet.

## 2013-11-05 ENCOUNTER — Encounter: Payer: Self-pay | Admitting: Infectious Disease

## 2013-11-14 ENCOUNTER — Telehealth: Payer: Self-pay | Admitting: *Deleted

## 2013-11-14 NOTE — Telephone Encounter (Signed)
[facility] nurse called stating patient's left arm appears swollen and warm to the touch. He started the Augmentin but did not finish it, he will restart it. She also said his B/P was elevated; advised he needed to contact his PCP regarding B/P. Patient does not have a fever. She requested patient be given an appt, concerned infection may have worsened. Given appt with Dr. Linus Salmons for 11/22/13.

## 2013-11-22 ENCOUNTER — Encounter: Payer: Self-pay | Admitting: Internal Medicine

## 2013-11-22 ENCOUNTER — Ambulatory Visit (INDEPENDENT_AMBULATORY_CARE_PROVIDER_SITE_OTHER): Payer: PRIVATE HEALTH INSURANCE | Admitting: Internal Medicine

## 2013-11-22 VITALS — BP 168/101 | HR 98 | Temp 98.2°F

## 2013-11-22 DIAGNOSIS — M009 Pyogenic arthritis, unspecified: Secondary | ICD-10-CM

## 2013-11-22 NOTE — Assessment & Plan Note (Signed)
No signs of infection now on exam.  I reviewed ultrasound done at [facility] and fluid.  I discussed the case with Dr. Sharol Given and he is going to order lymphedema pump.  I told the patient and his mother that he can stop the Augmentin and the infection is resolved.  No drainage.    Return PRN

## 2013-11-22 NOTE — Progress Notes (Signed)
   Subjective:    Patient ID: Adrian Khan, male    DOB: 1975/12/05, 38 y.o.   MRN: [id number]  HPI Here for a work in visit for swelling of his arm.  He has been seen for Group B bactermia and abscess, septic arthritis and previously treated, though intermittent compliance.  He was supposed to be on Augmentin after deciding on his own to stop antibiotics through the IV.  He stopped that after a short time due to GI upset.  No fever or chills.  His home health aid was concerned with infection.  Also his BP has been elevated.     Review of Systems  Constitutional: Negative for fever and chills.  Gastrointestinal: Negative for abdominal pain.  Musculoskeletal:       Left arm swelling  Skin: Negative for rash.       Objective:   Physical Exam  Constitutional:  Morbidly obese  Eyes: No scleral icterus.  Musculoskeletal:  Left arm about 3 times the size of right arm.  No warmth, no induration, no tenderness.  Difficult exam otherwise due to size          Assessment & Plan:

## 2014-02-11 ENCOUNTER — Encounter: Payer: Self-pay | Admitting: Infectious Disease

## 2014-07-24 ENCOUNTER — Ambulatory Visit: Payer: PRIVATE HEALTH INSURANCE | Admitting: Infectious Disease

## 2014-08-16 ENCOUNTER — Encounter: Payer: Self-pay | Admitting: Infectious Disease

## 2014-08-16 ENCOUNTER — Ambulatory Visit (INDEPENDENT_AMBULATORY_CARE_PROVIDER_SITE_OTHER): Payer: PRIVATE HEALTH INSURANCE | Admitting: Infectious Disease

## 2014-08-16 VITALS — BP 169/89 | HR 102 | Temp 98.7°F

## 2014-08-16 DIAGNOSIS — R234 Changes in skin texture: Secondary | ICD-10-CM

## 2014-08-16 DIAGNOSIS — M009 Pyogenic arthritis, unspecified: Secondary | ICD-10-CM

## 2014-08-16 DIAGNOSIS — E11621 Type 2 diabetes mellitus with foot ulcer: Secondary | ICD-10-CM

## 2014-08-16 DIAGNOSIS — N183 Chronic kidney disease, stage 3 unspecified: Secondary | ICD-10-CM

## 2014-08-16 DIAGNOSIS — L97509 Non-pressure chronic ulcer of other part of unspecified foot with unspecified severity: Secondary | ICD-10-CM

## 2014-08-16 DIAGNOSIS — E1169 Type 2 diabetes mellitus with other specified complication: Secondary | ICD-10-CM

## 2014-08-16 DIAGNOSIS — R5381 Other malaise: Secondary | ICD-10-CM

## 2014-08-16 DIAGNOSIS — M869 Osteomyelitis, unspecified: Secondary | ICD-10-CM

## 2014-08-16 NOTE — Progress Notes (Signed)
Subjective:    Patient ID: Adrian Khan, male    DOB: [birth year], 38 y.o.   MRN: [id number]  HPI   37 y.o. 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 At the time attempts to aspirate the shoulder by IR were compromised by his size as were imaging such as MRI and Orthopedics at that time were not convinced for septic arthritis. Ultimately pt was DC with IV rocephin and finally an MRI performed at [facility] that showed large shoulder effusion with multiple small abscesses and pymyositis along with abscess in biceps muscle. Pt was taken to the OR on [id number] by Dr. Sharol Given and had I &D of the left shoulder and left arm. I had narrowed him to ceftriaxone postoperatively and I had planned  on giving him a six-week postoperative course. Ultimately was discharged the hospital but unfortunately his living conditions at home were not conducive to him continuing to receive the IV antibiotics with his housing apparently being very unclean. His IV antibiotics were stopped and his PICC line was stopped I've wanted him on oral antibiotics this was apparently not done initially though later placed him on Augmentin last time in December of 2014  He had some persistent pain and was seen by my partner Dr. Linus Salmons who felt that there is no evidence of recurrent infection   He had previously had a bad wound in his right foot as well and apparently he developed progression of infection in the foot with now Osteomyelitis abscess right foot fifth metatarsal head. He underwent right foot fifth ray and dictation on September 19 2013  by Dr. Sharol Given.  He was brought back in to see [place] via EMS --he cannot transport in car for evaluation of his overall deconditioning.   He still remains largely bed bound and after standing for a few moments is too weak to move even a few steps before he does sit down again.  Noted on  exam and his site of osteomyelitis on his foot during this visit as this was not chief complaint today. He states Dr. Sharol Given had tried to get pt into Inpatient Rehab but that his insurance at the time would not pay for it.  He is here to request referral to "inpatient rehab" now that he has new insurer.  He also has noted induration along his abdominal wall in the left side was an area that he seems to more frequent than the right. Isn't present for many months making me think that cellulitis is unlikely.      Review of Systems  Constitutional: Positive for fatigue. Negative for fever, chills, diaphoresis, activity change, appetite change and unexpected weight change.  Eyes: Negative for photophobia and visual disturbance.  Respiratory: Negative for cough and shortness of breath.   Cardiovascular: Negative for palpitations and leg swelling.  Gastrointestinal: Negative for nausea, vomiting, abdominal pain, diarrhea and constipation.  Musculoskeletal: Positive for back pain, gait problem and myalgias.  Skin: Positive for rash.  Neurological: Positive for weakness. Negative for dizziness, tremors and light-headedness.  Psychiatric/Behavioral: Negative for behavioral problems, confusion, sleep disturbance, dysphoric mood, decreased concentration and agitation.       Objective:   Physical Exam  Constitutional: He is oriented to person, place, and time. He appears well-nourished. No distress.  HENT:  Head: Normocephalic and atraumatic.  Mouth/Throat: Oropharynx is clear and moist.  Eyes: Conjunctivae  and EOM are normal.  Neck: Normal range of motion. Neck supple.  Cardiovascular: Normal rate and regular rhythm.  Exam reveals no friction rub.   Pulmonary/Chest: Effort normal. No respiratory distress. He has no wheezes.  Abdominal: Soft. He exhibits no distension.  Musculoskeletal: He exhibits edema.  Neurological: He is alert and oriented to person, place, and time. He exhibits normal  muscle tone.  Skin: Skin is warm and dry. He is not diaphoretic. There is erythema. No pallor.     Psychiatric: He has a normal mood and affect. His behavior is normal. Judgment and thought content normal.        Assessment & Plan:   #1 Deconditioning: still essentially bed bound. WIll put in referral to Rehab medicine to see if he could be admitted to inpatient rehab now. I wonder if he would not benefit also from bariatric surgery. I spent greater than 25 minutes with the patient including greater than 50% of time in face to face counsel of the patient and in coordination of their care.    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  #5 morbid obesity: I think he needs bariatric surgery ultimately  #6 Skin induration: unlikely to be bacterial cellulitis: could be fungal and advised trial of topical clotrimazole and trying to stay off that side more

## 2014-08-16 NOTE — Patient Instructions (Signed)
WE WILL REFER YOU TO REHAB DOCTORS

## 2014-08-29 ENCOUNTER — Ambulatory Visit: Payer: PRIVATE HEALTH INSURANCE | Admitting: Internal Medicine

## 2014-09-23 ENCOUNTER — Telehealth: Payer: Self-pay

## 2014-09-23 NOTE — Telephone Encounter (Signed)
I spoke with Adrian Khan regarding this patient.  Information shared regarding additional time needed due to multiple needs and obesity.  He will be accompanied by his Mother.  He needs to establish with a primary physician and discuss needs.   Appointment given  December, 15, 2015 @ 2:15 pm with Dr Eulas Post.   Laverle Patter, RN

## 2014-10-01 ENCOUNTER — Ambulatory Visit (INDEPENDENT_AMBULATORY_CARE_PROVIDER_SITE_OTHER): Payer: PRIVATE HEALTH INSURANCE | Admitting: Internal Medicine

## 2014-10-01 ENCOUNTER — Encounter: Payer: Self-pay | Admitting: Internal Medicine

## 2014-10-01 VITALS — BP 178/112 | HR 102 | Temp 98.0°F

## 2014-10-01 DIAGNOSIS — I10 Essential (primary) hypertension: Secondary | ICD-10-CM

## 2014-10-01 DIAGNOSIS — E1165 Type 2 diabetes mellitus with hyperglycemia: Secondary | ICD-10-CM

## 2014-10-01 DIAGNOSIS — IMO0002 Reserved for concepts with insufficient information to code with codable children: Secondary | ICD-10-CM

## 2014-10-01 DIAGNOSIS — R5381 Other malaise: Secondary | ICD-10-CM

## 2014-10-01 DIAGNOSIS — N184 Chronic kidney disease, stage 4 (severe): Secondary | ICD-10-CM

## 2014-10-01 DIAGNOSIS — E118 Type 2 diabetes mellitus with unspecified complications: Secondary | ICD-10-CM

## 2014-10-01 DIAGNOSIS — D72829 Elevated white blood cell count, unspecified: Secondary | ICD-10-CM

## 2014-10-01 LAB — COMPLETE METABOLIC PANEL WITH GFR
ALBUMIN: 2.4 g/dL — AB (ref 3.5–5.2)
AST: 8 U/L (ref 0–37)
Alkaline Phosphatase: 101 U/L (ref 39–117)
BUN: 37 mg/dL — AB (ref 6–23)
CALCIUM: 6.5 mg/dL — AB (ref 8.4–10.5)
CHLORIDE: 109 meq/L (ref 96–112)
CO2: 16 mEq/L — ABNORMAL LOW (ref 19–32)
Creat: 3.9 mg/dL — ABNORMAL HIGH (ref 0.50–1.35)
GFR, Est African American: 22 mL/min — ABNORMAL LOW
GFR, Est Non African American: 19 mL/min — ABNORMAL LOW
Glucose, Bld: 272 mg/dL — ABNORMAL HIGH (ref 70–99)
Potassium: 4.6 mEq/L (ref 3.5–5.3)
Sodium: 137 mEq/L (ref 135–145)
Total Bilirubin: 0.3 mg/dL (ref 0.2–1.2)
Total Protein: 6.5 g/dL (ref 6.0–8.3)

## 2014-10-01 LAB — LIPID PANEL
Cholesterol: 147 mg/dL (ref 0–200)
HDL: 35 mg/dL — AB (ref >39)
LDL CALC: 97 mg/dL (ref 0–99)
Total CHOL/HDL Ratio: 4.2 Ratio
Triglycerides: 76 mg/dL (ref <150)
VLDL: 15 mg/dL (ref 0–40)

## 2014-10-01 LAB — CBC
HCT: 33.5 % — ABNORMAL LOW (ref 39.0–52.0)
Hemoglobin: 10.8 g/dL — ABNORMAL LOW (ref 13.0–17.0)
MCH: 28.6 pg (ref 26.0–34.0)
MCHC: 32.2 g/dL (ref 30.0–36.0)
MCV: 88.6 fL (ref 78.0–100.0)
MPV: 10.6 fL (ref 9.4–12.4)
PLATELETS: 405 10*3/uL — AB (ref 150–400)
RBC: 3.78 MIL/uL — ABNORMAL LOW (ref 4.22–5.81)
RDW: 12.9 % (ref 11.5–15.5)
WBC: 17.1 10*3/uL — ABNORMAL HIGH (ref 4.0–10.5)

## 2014-10-01 LAB — GLUCOSE, CAPILLARY: Glucose-Capillary: 275 mg/dL — ABNORMAL HIGH (ref 70–99)

## 2014-10-01 LAB — POCT GLYCOSYLATED HEMOGLOBIN (HGB A1C): HEMOGLOBIN A1C: 10.4

## 2014-10-01 MED ORDER — ONETOUCH ULTRA MINI W/DEVICE KIT: PACK | Status: AC

## 2014-10-01 MED ORDER — GLUCOSE BLOOD VI STRP: ORAL_STRIP | Status: DC

## 2014-10-01 MED ORDER — AMLODIPINE BESYLATE 5 MG PO TABS: 5.0000 mg | ORAL_TABLET | Freq: Every day | ORAL | Status: DC

## 2014-10-01 MED ORDER — ONETOUCH DELICA LANCETS FINE MISC: Status: AC

## 2014-10-01 MED ORDER — METFORMIN HCL ER 500 MG PO TB24: 500.0000 mg | ORAL_TABLET | Freq: Every day | ORAL | Status: DC

## 2014-10-01 NOTE — Assessment & Plan Note (Addendum)
BP Readings from Last 3 Encounters:  10/01/14 178/112  08/16/14 169/89  11/22/13 168/101    Lab Results  Component Value Date   NA 139 09/19/2013   K 3.7 09/19/2013   CREATININE 1.04 09/19/2013    Assessment: Blood pressure control:  Poor Comments: Pt not on any blood pressure medicaitons. Previously was on Hydralazine 25mg  TID but has not taken it in approx 5 months.   Plan: Medications:  Starting Amlodipine 5mg . Avoiding diuretics at this time b/c he is bed bound and frequently needing to void would be difficult for the patient and his mother who is his caregiver Other plans: Checking CMP today to assess electrolytes and renal function           F/u in 2 weeks.

## 2014-10-01 NOTE — Assessment & Plan Note (Addendum)
In the setting of his morbid obesity the patient is bed bound. A referral has been placed to Inpatient Rehab. - Will f/u on Rehab referral - Consulting CSW for assistance for home health needs and possible rehab placement

## 2014-10-01 NOTE — Patient Instructions (Addendum)
**  Begin taking the Amlodipine 5mg  daily. This is for your blood pressure.  **Begin taking Metformin XR once a day. This has less GI side effects than the regular Metformin. If you have worsening diarrhea on the Metformin XR, please let us know at your net visit.   **Please call Dr. Jess Barters office about podiatry services to see if they offer them. Otherwise, please call the clinic and we will refer you to a Podiatrist for your foot care.   **Our social worker has been consulted to help with home health needs and possible placement.   **We are checking you electrolytes, kidney function, cholesterol levels, and you blood counts. We will review these with you at your next appt in January.     General Instructions:   Please bring your medicines with you each time you come to clinic.  Medicines may include prescription medications, over-the-counter medications, herbal remedies, eye drops, vitamins, or other pills.

## 2014-10-01 NOTE — Progress Notes (Signed)
Patient ID: Adrian Khan, male   DOB: [birth year], 38 y.o.   MRN: [id number]  Subjective:   Patient ID: Adrian Khan male   DOB: [birth year] 38 y.o.   MRN: [id number]  HPI: [name] is a 38 y.o. M w/ PMH morbid obesity, HTN, DM2, and group B strep bacteremia with concerns for a septic shoulder that was treated with IV --> po abx, and later developed osteomyelitis of the R foot requiring R foot 5th ray amputation 09/19/13 by Dr. Sharol Given, presents to establish care at [facility].  Pt is morbidly obese with his last weight measured 1 year ago as 404 lbs 09/20/13, but has not been checked since b/c the pt is bed bound. He has chronic aches and pains and was previously on MS Contin and Percocet.  Paperwork from his mother is from [employer], which is a service that has been coming to his house providing nursing care. His A1c was checked during one of those visits: A1c 11.0 08/12/14. At this visit, the NP noted that he needed to have his renal function checked, but I am not sure that it was done. He had been taking Januvia for his DM2, which he states did not help control his blood sugars.   He has a h/o HTN and was previously on hydralazine.  Of note, Dr. Tommy Medal placed a referral to Inpatient Rehab at his last [facility] visit 08/16/14. The referral looks like it is still pending.   Past Medical History  Diagnosis Date  . Shortness of breath     with exertion  . PONV (postoperative nausea and vomiting)   . Diabetes mellitus without complication   . Anxiety   . Mental disorder   . Depression   . Sleep apnea     done at [facility].  Marland Kitchen Pneumonia   . GERD (gastroesophageal reflux disease)   . Constipation   . Neuropathy     feet, hands  . Anginal pain     chest pressure has increased in the past few days; patient denied  on 09/20/12  . Obesity   . Hx MRSA infection    Current Outpatient Prescriptions  Medication Sig Dispense Refill  . amLODipine (NORVASC) 5 MG tablet Take 1 tablet (5 mg total)  by mouth daily. 30 tablet 1  . Blood Glucose Monitoring Suppl (ONE TOUCH ULTRA MINI) W/DEVICE KIT Check blood sugar one time a day 1 each 1  . glucose blood (ONE TOUCH ULTRA TEST) test strip Check blood sugar one time a day 100 each 8  . hydrALAZINE (APRESOLINE) 25 MG tablet Take 1 tablet (25 mg total) by mouth 3 (three) times daily. (Patient not taking: Reported on 10/01/2014) 90 tablet 0  . metFORMIN (GLUCOPHAGE XR) 500 MG 24 hr tablet Take 1 tablet (500 mg total) by mouth daily with breakfast. 30 tablet 1  . morphine (MS CONTIN) 30 MG 12 hr tablet Take 1 tablet (30 mg total) by mouth every 12 (twelve) hours. (Patient not taking: Reported on 10/01/2014) 60 tablet 0  . ONETOUCH DELICA LANCETS FINE MISC Check blood sugar one time a day 100 each 5  . oxyCODONE-acetaminophen (PERCOCET/ROXICET) 5-325 MG per tablet Take 1-2 tablets by mouth every 4 (four) hours as needed for moderate pain.    Marland Kitchen senna-docusate (SENOKOT-S) 8.6-50 MG per tablet Take 1 tablet by mouth 2 (two) times daily as needed for constipation.    . silver sulfADIAZINE (SILVADENE) 1 % cream Apply 1 application topically daily.    Marland Kitchen  sitaGLIPtin (JANUVIA) 100 MG tablet Take 100 mg by mouth daily.     No current facility-administered medications for this visit.   No family history on file. History   Social History  . Marital Status: Single    Spouse Name: N/A    Number of Children: N/A  . Years of Education: N/A   Social History Main Topics  . Smoking status: Never Smoker   . Smokeless tobacco: Never Used  . Alcohol Use: No  . Drug Use: No  . Sexual Activity: None   Other Topics Concern  . None   Social History Narrative   Review of Systems: Constitutional: Denies fever, chills, appetite change. +fatigue  HEENT: +hearing loss. Respiratory: Denies SOB, cough  Cardiovascular: Denies chest pain, palpitations Gastrointestinal: Denies nausea, vomiting. +abdominal pain and lose stools.  Genitourinary: Denies dysuria,  urgency, frequency Musculoskeletal: +myalgias and back pain and left hand with decreased ROM of fingers.  Skin: Denies rash or wound.  Neurological: Denies dizziness, seizures, syncope, numbness. +weakness of LUE and hand  Psychiatric/Behavioral: Denies mood changes or confusion  Objective:  Physical Exam: Filed Vitals:   10/01/14 1506 10/01/14 1551  BP: 189/99 178/112  Pulse: 98 102  Temp: 98 F (36.7 C)   TempSrc: Oral   SpO2: 100%   Physical exam limited 2/2 body habitus and b/c he is strapped tightly in the stretcher and if the straps are undone he will fall to the floor.  Constitutional: Vital signs reviewed.  Patient is a well-developed and well-nourished male in no acute distress and cooperative with exam. Alert and oriented x3.  Head: Normocephalic and atraumatic Mouth: No erythema or exudates, MMM Eyes: PERRL, EOMI. No scleral icterus.  Neck: Supple, trachea midline, normal ROM, No appreciable JVD.  Cardiovascular: RRR,  no MRG, 1+ DP pulse in RLE, unable to palpate DP pulse in LLE. Pulmonary/Chest: Normal respiratory effort, CTAB anteriorly with no wheezes, rales, or rhonchi. Unable to move patient from the stretcher, Abdominal: Soft, bowel sounds are normal, no appreciable masses or guarding present. Diffusely TTP, esp in LLQ with overlying skin slightly darker (per pt this skin changes have been present since childhood). Musculoskeletal: Pt able to move all 4 extremities but is not able to stand. Unable to perform ROM exercises in BLE. R foot s/p 5th ray amputation, left foot s/p complete amputation of all toes.  No ulcerations or breakdown appreciated. ROM intact in RUE, but diminished in the LUE with claw-like contractures of the 3rd and 4th digits. Neurological: A&O x3, Strength is normal and symmetric bilaterally, cranial nerve II-XII are grossly intact, no focal deficits, sensory intact to light touch bilaterally.  Skin: Appears warm, dry and intact anteriorly w/o rash or  breakdown. Unable to examine dorsum or groin.  Psychiatric: Normal mood and affect. Speech and behavior is normal.   Assessment & Plan:   Please refer to Problem List based Assessment and Plan

## 2014-10-01 NOTE — Assessment & Plan Note (Addendum)
Lab Results  Component Value Date   HGBA1C 10.4 10/01/2014   HGBA1C 13.7* 07/25/2013   HGBA1C 11.5* 09/22/2012    Pt with h/o DM2, diagnosed at 57 ys old. He has been on Januvia w/o improvement of his blood sugars, and had cramping with sulfonamide derivative drugs. He states he had diarrhea with Metformin. He has been off all medications for approx. 5 months. His las A1c was reported as 11.0 on 08/12/14. Pt's feet with scaling but no ulcerations or break down. - Repeating A1c today --> 10.4 - Checking lipid panel - Starting Metformin XR 500mg  daily, which has less GI side effects that the BID version.  - Glucometer, lancets, and strips ordered - Will need eye exam once able to stand/ or be examined - Pt to contact Dr. Sharol Given about Podiatry services; if they do not have them there, he is to call the clinic for Podiatry referral. - F/u in 2 weeks

## 2014-10-01 NOTE — Assessment & Plan Note (Addendum)
Last weight 404 lbs 09/20/13. He states that he used to weigh 525 lbs when he 1st move to [place]. Pt now is bed bound and is unable to stand at this time 2/2 physical deconditioning that seemed to begin after he became septic with group B strep. He is requesting inpatient rehab to build up his strength to help him begin to exercise. He states that he has lost weight working on portion control. However, his food choices still need some improvement: grilled ham and cheese for breakfast, hamburger for lunch, and baked shrimp and french fries vs Chinese pork fried rice for dinner. He declined nutritionist/diabetes education counseling with [facility] at this time. - Checking CMP to assess albumin level - Recommended continuing to cut back on po intake/calories - Referral placed to CSW for [facility] assistance and Rehab assistance. - May benefit from bariatric surgery

## 2014-10-01 NOTE — Assessment & Plan Note (Signed)
Hgb 8.5 09/20/14. Normocytic. Likely lower in teh post-op setting.  - Repeating CBC today to assess anemia.

## 2014-10-02 ENCOUNTER — Inpatient Hospital Stay (HOSPITAL_COMMUNITY)
Admission: AD | Admit: 2014-10-02 | Payer: PRIVATE HEALTH INSURANCE | Source: Ambulatory Visit | Admitting: Internal Medicine

## 2014-10-02 ENCOUNTER — Telehealth: Payer: Self-pay | Admitting: Licensed Clinical Social Worker

## 2014-10-02 LAB — MICROALBUMIN / CREATININE URINE RATIO
Creatinine, Urine: 57.8 mg/dL
Microalb Creat Ratio: 7743.9 mg/g — ABNORMAL HIGH (ref 0.0–30.0)
Microalb, Ur: 447.6 mg/dL — ABNORMAL HIGH (ref <2.0)

## 2014-10-02 NOTE — Telephone Encounter (Signed)
Mr. Adrian Khan was referred to CSW for Home Health services and rehab options.  CSW attempted to contact Mr. Adrian Khan however phone number listed is currently not working.  CSW was able to contact pt's mother, Adrian Khan on an alternate phone line.  Mother very terse with her replies, stating she only had 35 minutes left on this phone line.  CSW explained a working number is needed for Mr. Adrian Khan to set up [place] [facility] services.  Due to limited finances, minutes will not be added to [phone] until January 3rd.  Pt cell phone is current broke and non-operable.  CSW informed Adrian Khan home health services will be initiated after January 3rd and referral to [facility] will be made.  CSW will follow up after January 3rd for further service needs. According to pt's EMR, pt presents to [facility] to establish care.  Pt has home care agency, but unsure if this is [facility] or visiting physician program, "[facility]".

## 2014-10-02 NOTE — Telephone Encounter (Signed)
Pt was active with [facility] earlier this year.  Once [facility] is listed as PCP, referral to [facility] will be made.

## 2014-10-02 NOTE — Assessment & Plan Note (Signed)
Leukocytosis to 17.1. Last check 09/19/13 was 12.9, which was when he had his right foot fifth metatarsal amputation 2/2 osteomyelitis 2/2 diabetes and diabetic nephropathy. He also has a h/o group B strep bacteremia and was under care of [facility] in 2014. With this leukocytosis, I am concerned that he has another infection and could possibly be bacteremic. Pt was afebrile and vitals were not concerning for sepsis. His exam was limited due to body habitus and he was securely strapped to the stretcher with the fear that he would fall off the stretcher if unstrapped given his body habitus. Feet with scaling skin but no ulcerations or breakdown. In looking under skin folds anteriorly, there were no areas concerning for infection. - Directly admitting the patient to IMTS from home: pt called but no answer, left msg. Will continue to try to call. - Obtain blood cultures x2 and CXR - He will need a complete examination of his skin, esp the back, sacrum, and groin.

## 2014-10-02 NOTE — Assessment & Plan Note (Addendum)
Pt with Cr 1.04 09/20/13. CMP from 10/01/14 with severely worsened renal function with Cr 3.9, BUN 37 from 11, and bicarb 16 from 21. AG 12. Urine microalbumin/Cr 1743.9. No issues with urinary retention or poor po intake. Pt with uncontrolled HTN and DM2, which are likely contributing to his CKD.  Being that he is constantly lying down, possible that he could have rhabdomyolysis. With this acute worsening and in the setting of a leukocytosis to 17.1, calling the patient to come to the hospital for direct admission, especially given that outpatient work up would be complicated by transportation and communication issues.  - Will need Nephrology consult  - Check UA - Check CK levels - Pt will need renal ultrasound  Addendum: 10/02/14 4:35pm  Phone numbers listed for the patient have all been disconnected, except the "work" number listed for his mother. A message was left on this phone [phone]) that Nikki will need to be directly admitted for further evaluation for his kidney dysfunction and leukocytosis. We have another phone number from the ID clinic [phone]) which the mother told our clinic on 10/01/14 it is the only number that is working currently. Multiple messages have been left on this phone, and it is no longer accepting messages.   The bed has been held all day for this patient, but cannot be held any longer as it is needed for admissions. In the last message for the patient's mother, which is our only method of communicating with the patient, she was asked to call the clinic and if it is after 5pm, she will need to contact the resident on call and have the patient transported to the ED.   Addendum: 10/03/14 11:37am  The patient's mother called today and is going to call transportation to pick up the patient and bring him to the hospital for direct admission. Bed management and the admitting team have been notified.

## 2014-10-03 ENCOUNTER — Inpatient Hospital Stay (HOSPITAL_COMMUNITY)
Admission: AD | Admit: 2014-10-03 | Payer: PRIVATE HEALTH INSURANCE | Source: Ambulatory Visit | Admitting: Internal Medicine

## 2014-10-03 ENCOUNTER — Inpatient Hospital Stay (HOSPITAL_COMMUNITY): Payer: PRIVATE HEALTH INSURANCE

## 2014-10-03 ENCOUNTER — Inpatient Hospital Stay (HOSPITAL_COMMUNITY)
Admission: EM | Admit: 2014-10-03 | Discharge: 2014-10-07 | DRG: 682 | Disposition: A | Discharge status: Home Health | Payer: PRIVATE HEALTH INSURANCE | Attending: Internal Medicine | Admitting: Internal Medicine

## 2014-10-03 ENCOUNTER — Encounter (HOSPITAL_COMMUNITY): Payer: Self-pay | Admitting: Emergency Medicine

## 2014-10-03 DIAGNOSIS — E1165 Type 2 diabetes mellitus with hyperglycemia: Secondary | ICD-10-CM

## 2014-10-03 DIAGNOSIS — R1030 Lower abdominal pain, unspecified: Secondary | ICD-10-CM | POA: Diagnosis not present

## 2014-10-03 DIAGNOSIS — E1121 Type 2 diabetes mellitus with diabetic nephropathy: Secondary | ICD-10-CM | POA: Diagnosis present

## 2014-10-03 DIAGNOSIS — D638 Anemia in other chronic diseases classified elsewhere: Secondary | ICD-10-CM | POA: Diagnosis present

## 2014-10-03 DIAGNOSIS — J189 Pneumonia, unspecified organism: Secondary | ICD-10-CM | POA: Diagnosis present

## 2014-10-03 DIAGNOSIS — G8929 Other chronic pain: Secondary | ICD-10-CM

## 2014-10-03 DIAGNOSIS — I10 Essential (primary) hypertension: Secondary | ICD-10-CM | POA: Diagnosis present

## 2014-10-03 DIAGNOSIS — Z89421 Acquired absence of other right toe(s): Secondary | ICD-10-CM | POA: Diagnosis not present

## 2014-10-03 DIAGNOSIS — D473 Essential (hemorrhagic) thrombocythemia: Secondary | ICD-10-CM | POA: Diagnosis present

## 2014-10-03 DIAGNOSIS — N184 Chronic kidney disease, stage 4 (severe): Secondary | ICD-10-CM | POA: Diagnosis present

## 2014-10-03 DIAGNOSIS — G4733 Obstructive sleep apnea (adult) (pediatric): Secondary | ICD-10-CM | POA: Diagnosis present

## 2014-10-03 DIAGNOSIS — D72829 Elevated white blood cell count, unspecified: Secondary | ICD-10-CM | POA: Diagnosis present

## 2014-10-03 DIAGNOSIS — Z6841 Body Mass Index (BMI) 40.0 and over, adult: Secondary | ICD-10-CM | POA: Diagnosis not present

## 2014-10-03 DIAGNOSIS — F419 Anxiety disorder, unspecified: Secondary | ICD-10-CM | POA: Diagnosis present

## 2014-10-03 DIAGNOSIS — I739 Peripheral vascular disease, unspecified: Secondary | ICD-10-CM | POA: Diagnosis present

## 2014-10-03 DIAGNOSIS — M869 Osteomyelitis, unspecified: Secondary | ICD-10-CM

## 2014-10-03 DIAGNOSIS — N289 Disorder of kidney and ureter, unspecified: Secondary | ICD-10-CM

## 2014-10-03 DIAGNOSIS — E872 Acidosis: Secondary | ICD-10-CM | POA: Diagnosis present

## 2014-10-03 DIAGNOSIS — E119 Type 2 diabetes mellitus without complications: Secondary | ICD-10-CM

## 2014-10-03 DIAGNOSIS — F329 Major depressive disorder, single episode, unspecified: Secondary | ICD-10-CM | POA: Diagnosis present

## 2014-10-03 DIAGNOSIS — B999 Unspecified infectious disease: Secondary | ICD-10-CM

## 2014-10-03 DIAGNOSIS — E739 Lactose intolerance, unspecified: Secondary | ICD-10-CM | POA: Diagnosis present

## 2014-10-03 DIAGNOSIS — Z89432 Acquired absence of left foot: Secondary | ICD-10-CM

## 2014-10-03 DIAGNOSIS — G629 Polyneuropathy, unspecified: Secondary | ICD-10-CM | POA: Diagnosis present

## 2014-10-03 DIAGNOSIS — N179 Acute kidney failure, unspecified: Secondary | ICD-10-CM | POA: Diagnosis not present

## 2014-10-03 DIAGNOSIS — Z9119 Patient's noncompliance with other medical treatment and regimen: Secondary | ICD-10-CM | POA: Diagnosis present

## 2014-10-03 DIAGNOSIS — Z882 Allergy status to sulfonamides status: Secondary | ICD-10-CM | POA: Diagnosis not present

## 2014-10-03 DIAGNOSIS — I129 Hypertensive chronic kidney disease with stage 1 through stage 4 chronic kidney disease, or unspecified chronic kidney disease: Secondary | ICD-10-CM | POA: Diagnosis present

## 2014-10-03 DIAGNOSIS — IMO0002 Reserved for concepts with insufficient information to code with codable children: Secondary | ICD-10-CM | POA: Diagnosis present

## 2014-10-03 DIAGNOSIS — K219 Gastro-esophageal reflux disease without esophagitis: Secondary | ICD-10-CM | POA: Diagnosis present

## 2014-10-03 DIAGNOSIS — E213 Hyperparathyroidism, unspecified: Secondary | ICD-10-CM | POA: Diagnosis present

## 2014-10-03 DIAGNOSIS — E1129 Type 2 diabetes mellitus with other diabetic kidney complication: Secondary | ICD-10-CM | POA: Diagnosis present

## 2014-10-03 LAB — URINALYSIS, ROUTINE W REFLEX MICROSCOPIC
Bilirubin Urine: NEGATIVE
GLUCOSE, UA: 500 mg/dL — AB
Ketones, ur: NEGATIVE mg/dL
LEUKOCYTES UA: NEGATIVE
Nitrite: NEGATIVE
SPECIFIC GRAVITY, URINE: 1.018 (ref 1.005–1.030)
Urobilinogen, UA: 0.2 mg/dL (ref 0.0–1.0)
pH: 6.5 (ref 5.0–8.0)

## 2014-10-03 LAB — COMPREHENSIVE METABOLIC PANEL
ALBUMIN: 1.8 g/dL — AB (ref 3.5–5.2)
ALT: 7 U/L (ref 0–53)
AST: 8 U/L (ref 0–37)
Alkaline Phosphatase: 110 U/L (ref 39–117)
Anion gap: 13 (ref 5–15)
BUN: 37 mg/dL — ABNORMAL HIGH (ref 6–23)
CALCIUM: 6.4 mg/dL — AB (ref 8.4–10.5)
CHLORIDE: 106 meq/L (ref 96–112)
CO2: 17 mEq/L — ABNORMAL LOW (ref 19–32)
CREATININE: 3.83 mg/dL — AB (ref 0.50–1.35)
GFR calc Af Amer: 22 mL/min — ABNORMAL LOW (ref >90)
GFR calc non Af Amer: 19 mL/min — ABNORMAL LOW (ref >90)
Glucose, Bld: 304 mg/dL — ABNORMAL HIGH (ref 70–99)
Potassium: 4.7 mEq/L (ref 3.7–5.3)
SODIUM: 136 meq/L — AB (ref 137–147)
Total Protein: 7.4 g/dL (ref 6.0–8.3)

## 2014-10-03 LAB — CREATININE, URINE, RANDOM: CREATININE, URINE: 51.7 mg/dL

## 2014-10-03 LAB — CBC WITH DIFFERENTIAL/PLATELET
Basophils Absolute: 0 10*3/uL (ref 0.0–0.1)
Basophils Relative: 0 % (ref 0–1)
Eosinophils Absolute: 0.5 10*3/uL (ref 0.0–0.7)
Eosinophils Relative: 3 % (ref 0–5)
HCT: 29.5 % — ABNORMAL LOW (ref 39.0–52.0)
Hemoglobin: 9.4 g/dL — ABNORMAL LOW (ref 13.0–17.0)
Lymphocytes Relative: 10 % — ABNORMAL LOW (ref 12–46)
Lymphs Abs: 1.8 10*3/uL (ref 0.7–4.0)
MCH: 28.1 pg (ref 26.0–34.0)
MCHC: 31.9 g/dL (ref 30.0–36.0)
MCV: 88.1 fL (ref 78.0–100.0)
Monocytes Absolute: 0.9 10*3/uL (ref 0.1–1.0)
Monocytes Relative: 5 % (ref 3–12)
Neutro Abs: 14.6 10*3/uL — ABNORMAL HIGH (ref 1.7–7.7)
Neutrophils Relative %: 82 % — ABNORMAL HIGH (ref 43–77)
Platelets: 358 10*3/uL (ref 150–400)
RBC: 3.35 MIL/uL — ABNORMAL LOW (ref 4.22–5.81)
RDW: 12.9 % (ref 11.5–15.5)
WBC: 17.9 10*3/uL — ABNORMAL HIGH (ref 4.0–10.5)

## 2014-10-03 LAB — RAPID URINE DRUG SCREEN, HOSP PERFORMED
Amphetamines: NOT DETECTED
BARBITURATES: NOT DETECTED
Benzodiazepines: NOT DETECTED
Cocaine: NOT DETECTED
Opiates: NOT DETECTED
Tetrahydrocannabinol: NOT DETECTED

## 2014-10-03 LAB — SODIUM, URINE, RANDOM: SODIUM UR: 80 meq/L

## 2014-10-03 LAB — URINE MICROSCOPIC-ADD ON

## 2014-10-03 MED ORDER — HEPARIN SODIUM (PORCINE) 5000 UNIT/ML IJ SOLN
5000.0000 [IU] | Freq: Three times a day (TID) | INTRAMUSCULAR | Status: DC
  Filled 2014-10-03 (×12): qty 1

## 2014-10-03 MED ORDER — SENNOSIDES-DOCUSATE SODIUM 8.6-50 MG PO TABS: 1.0000 | ORAL_TABLET | Freq: Two times a day (BID) | ORAL | Status: DC | PRN

## 2014-10-03 MED ORDER — AMLODIPINE BESYLATE 5 MG PO TABS
5.0000 mg | ORAL_TABLET | Freq: Every day | ORAL | Status: DC
  Administered 2014-10-03: 5 mg via ORAL
  Filled 2014-10-03 (×2): qty 1

## 2014-10-03 MED ORDER — INSULIN ASPART 100 UNIT/ML ~~LOC~~ SOLN: 0.0000 [IU] | Freq: Every day | SUBCUTANEOUS | Status: DC

## 2014-10-03 MED ORDER — INSULIN ASPART 100 UNIT/ML ~~LOC~~ SOLN: 0.0000 [IU] | Freq: Three times a day (TID) | SUBCUTANEOUS | Status: DC

## 2014-10-03 MED ORDER — SODIUM CHLORIDE 0.9 % IV SOLN
INTRAVENOUS | Status: AC
  Administered 2014-10-03: 20:00:00 via INTRAVENOUS

## 2014-10-03 NOTE — ED Notes (Signed)
Pt requesting to speak with MD regarding admission status and whether he can eat.

## 2014-10-03 NOTE — Progress Notes (Signed)
Internal Medicine Clinic Attending  Case discussed with Dr. Eulas Post at the time of the visit.  We reviewed the resident's history and exam and pertinent patient test results.  I agree with the assessment, diagnosis, and plan of care documented in the resident's note.  Many attempts were made to get in touch with patient given derangements found on blood work.  Further attempts will be made until contact made.

## 2014-10-03 NOTE — Progress Notes (Signed)
Admission note:   Arrival Method: Admitted from ED. Mental Status: A&OX4. Telemetry: N/A.  Skin: Bilat feet dry/peeling/cracked. Patient refused an assessment of his back/buttocks/sacrum/scrotum, etc. States "I haven't had any skin problems since last year."  Tubes: N/A. IV: RH NSL 10/03/14. Pain: Denies.  Family: Mother at bedside. Living Situation: From home with mother. Safety Measures: Patient in bariatric bed. Siderails x4. 6E Orientation: Oriented to unit and surroundings. Call bell within reach.  Joellen Jersey, RN.

## 2014-10-03 NOTE — Telephone Encounter (Signed)
Pt is being admitted.   CSW will sign off.

## 2014-10-03 NOTE — ED Notes (Signed)
Placed on geriatric bed

## 2014-10-03 NOTE — H&P (Signed)
Date: 10/03/2014               Patient Name:  Adrian Khan MRN: [id number]  DOB: 03-Sep-1976 Age / Sex: 38 y.o., male   PCP: No primary care provider on file.         Medical Service: Internal Medicine Teaching Service         Attending Physician: Dr. Aldine Contes, MD    First Contact: Dr. Raelene Bott Pager: [phone]  Second Contact: Dr. Redmond Pulling Pager: [phone]       After Hours (After 5p/  First Contact Pager: [phone]  weekends / holidays): Second Contact Pager: [phone]   Chief Complaint: Leukocytosis and possible acute kidney injury on labs  History of Present Illness:   Patient is a 38 year old with a history of hypertension, morbid obesity, type 2 diabetes who presents with no major symptoms but with lab abnormalities including a leukocytosis and increased creatinine compared to one year prior. Patient was recently evaluated in clinic on 10/01/2014 to establish care with the internal medicine clinic. At that time, routine labs were drawn which were remarkable for elevated blood pressure, and increase in creatinine to 3.9 compared to prior of 1.04 on 09/20/2013, bicarbonate of 16 from a prior 21, a urine microalbumin of 1743.9, and a leukocytosis of 17.1. Patient was initially sent home from clinic and was called to come into the hospital for a direct admission for further workup. Patient overall reports no signs of infection. Patient denies any fevers, cough, skin lesions, purulent drainage from any skin lesions, nausea, or vomiting. Patient does report having some chronic diarrhea but denies any recent antibiotic use or residence in a healthcare facility recently.   Patient's major complaint has to do with muscle spasms that he feels throughout his abdomen. He states that this has been going on for around a year, almost as long as the episodes that he has had with abscesses and septic arthritis of his left shoulder. Patient also reporting some firmness in the left side of his abdomen  that has been going on in the same time, but denies any significant pain in the area. Patient denies any recurrent issues with pain or tenderness in his left shoulder or left arm. Otherwise, patient reports that he is very needle phobic and is initially refusing the use of subcutaneous insulin to control his diabetes.  Meds: Current Facility-Administered Medications  Medication Dose Route Frequency Provider Last Rate Last Dose  . amLODipine (NORVASC) tablet 5 mg  5 mg Oral Daily Francesca Oman, DO      . heparin injection 5,000 Units  5,000 Units Subcutaneous 3 times per day Francesca Oman, DO      . senna-docusate (Senokot-S) tablet 1 tablet  1 tablet Oral BID PRN Francesca Oman, DO        Past Medical History  Diagnosis Date  . Shortness of breath     with exertion  . PONV (postoperative nausea and vomiting)   . Diabetes mellitus without complication   . Anxiety   . Mental disorder   . Depression   . Sleep apnea     done at [facility].  Marland Kitchen Pneumonia   . GERD (gastroesophageal reflux disease)   . Constipation   . Neuropathy     feet, hands  . Anginal pain     chest pressure has increased in the past few days; patient denied  on 09/20/12  . Obesity   . Hx MRSA infection  Past Surgical History  Procedure Laterality Date  . I&d extremity  12/22/2011    Procedure: IRRIGATION AND DEBRIDEMENT EXTREMITY;  Surgeon: Sharmon Revere, MD;  Location: [place];  Service: Orthopedics;  Laterality: Left;  . I&d extremity  12/24/2011    Procedure: IRRIGATION AND DEBRIDEMENT EXTREMITY;  Surgeon: Sharmon Revere, MD;  Location: [place];  Service: Orthopedics;  Laterality: Left;  Irrigation and debridement left foot.  . I&d extremity  12/26/2011    Procedure: IRRIGATION AND DEBRIDEMENT EXTREMITY;  Surgeon: Sharmon Revere, MD;  Location: [place];  Service: Orthopedics;  Laterality: Left;  . I&d extremity  12/28/2011    Procedure: IRRIGATION AND DEBRIDEMENT EXTREMITY;  Surgeon: Sharmon Revere, MD;  Location: [place];   Service: Orthopedics;  Laterality: Left;  I&D Left Foot;Application of  Wound Vac.to dorsal and plantar areas of left foot  . Eye surgery    . Toe surgery      Bone removed- 2 toe right  . Wisdom tooth extraction    . Amputation  09/22/2012    Procedure: AMPUTATION FOOT;  Surgeon: Newt Minion, MD;  Location: [place];  Service: Orthopedics;  Laterality: Left;  Left Midfoot Amputation  . Shoulder arthroscopy Left 08/18/2013    Procedure: ARTHROSCOPY SHOULDER and subacromial decompresion and debreidment of abcess;  Surgeon: Newt Minion, MD;  Location: [place];  Service: Orthopedics;  Laterality: Left;  . Toe amputation Right 09/19/2012    RTFOOT 5TH TOE              DR DUDA  . Amputation Right 09/19/2013    Procedure: AMPUTATION RAY- right;  Surgeon: Newt Minion, MD;  Location: [place];  Service: Orthopedics;  Laterality: Right;  Right Foot 5th Ray Amputation    Allergies: Allergies as of 10/03/2014 - Review Complete 10/03/2014  Allergen Reaction Noted  . Sulfonamide derivatives Other (See Comments)    History reviewed. No pertinent family history. History   Social History  . Marital Status: Single    Spouse Name: N/A    Number of Children: N/A  . Years of Education: N/A   Occupational History  . Not on file.   Social History Main Topics  . Smoking status: Never Smoker   . Smokeless tobacco: Never Used  . Alcohol Use: No  . Drug Use: No  . Sexual Activity: Not on file   Other Topics Concern  . Not on file   Social History Narrative    Review of Systems: All pertinent ROS as stated in HPI.   Physical Exam: Blood pressure 165/98, pulse 98, temperature 98.6 F (37 C), temperature source Oral, resp. rate 24, height 5\' 11"  (1.803 m), weight 0 lb (0 kg), SpO2 100 %. General: resting in bed, extremely obese HEENT: PERRL, EOMI, no scleral icterus, unable to assess JVP Cardiac: RRR, no rubs, murmurs or gallops Pulm: clear to auscultation bilaterally, moving normal volumes of  air Abd: soft, nontender, patient refusing palpation on left side of abdomen (though not secondary to pain but rather generalized discomfort) Back: Skin tag with no areas of skin breakdown, exam of lower back limited given habitus Ext: warm and well perfused, no pedal edema, clawlike contractures of the left hand, transmetatarsal amputation of the left foot, right foot with fifth ray amputation Neuro: alert and oriented X3, cranial nerves II-XII grossly intact Skin: no rashes or lesions noted Psych: appropriate affect, somewhat tangential, good mood  Lab results: Basic Metabolic Panel:  Recent Labs  10/01/14  1557 10/03/14 1402  NA 137 136*  K 4.6 4.7  CL 109 106  CO2 16* 17*  GLUCOSE 272* 304*  BUN 37* 37*  CREATININE 3.90* 3.83*  CALCIUM 6.5* 6.4*   Liver Function Tests:  Recent Labs  10/01/14 1557 10/03/14 1402  AST 8 8  ALT <8 7  ALKPHOS 101 110  BILITOT 0.3 <0.2*  PROT 6.5 7.4  ALBUMIN 2.4* 1.8*   No results for input(s): LIPASE, AMYLASE in the last 72 hours. No results for input(s): AMMONIA in the last 72 hours. CBC:  Recent Labs  10/01/14 1557 10/03/14 1402  WBC 17.1* 17.9*  NEUTROABS  --  14.6*  HGB 10.8* 9.4*  HCT 33.5* 29.5*  MCV 88.6 88.1  PLT 405* 358   Cardiac Enzymes: No results for input(s): CKTOTAL, CKMB, CKMBINDEX, TROPONINI in the last 72 hours. BNP: No results for input(s): PROBNP in the last 72 hours. D-Dimer: No results for input(s): DDIMER in the last 72 hours. CBG:  Recent Labs  10/01/14 1558  GLUCAP 275*   Hemoglobin A1C:  Recent Labs  10/01/14 1606  HGBA1C 10.4   Fasting Lipid Panel:  Recent Labs  10/01/14 1557  CHOL 147  HDL 35*  LDLCALC 97  TRIG 76  CHOLHDL 4.2   Thyroid Function Tests: No results for input(s): TSH, T4TOTAL, FREET4, T3FREE, THYROIDAB in the last 72 hours. Anemia Panel: No results for input(s): VITAMINB12, FOLATE, FERRITIN, TIBC, IRON, RETICCTPCT in the last 72 hours. Coagulation: No  results for input(s): LABPROT, INR in the last 72 hours. Urine Drug Screen: Drugs of Abuse  No results found for: LABOPIA, COCAINSCRNUR, LABBENZ, AMPHETMU, THCU, LABBARB  Alcohol Level: No results for input(s): ETH in the last 72 hours. Urinalysis: No results for input(s): COLORURINE, LABSPEC, PHURINE, GLUCOSEU, HGBUR, BILIRUBINUR, KETONESUR, PROTEINUR, UROBILINOGEN, NITRITE, LEUKOCYTESUR in the last 72 hours.  Invalid input(s): APPERANCEUR  Imaging results:  No results found.  Assessment & Plan by Problem: Active Problems:   Renal insufficiency  Patient is a 38 year old male with a history of hypertension, type 1 diabetes, group B strep bacteremia, chronic kidney disease, osteomyelitis of the right foot, objective sleep apnea, anemia of chronic illness, and chronic pain who presents to the hospital as a direct admission for leukocytosis and acute renal insufficency.  Leukocytosis with suspected infection of unknown source: White count is 17.1 up from 12.9 one year ago, with a left shift. Concurrent thrombocythemia, likely reactive. It is unclear with the time course of this elevation in white count has been given his last white count was almost a year ago. Patient is afebrile. There are no clear signs of infection on exam or interview. Patient is reporting some chronic diarrhea which may represent community acquired C. difficile colitis given lack of contact with healthcare facilities or use of antibiotics recently. This may also be consistent with vague abdominal spasms that the patient has been reporting. However, further evaluation for other sources of infection will be limited by the patient's body habitus. -Chest x-ray pending -Urinalysis pending - blood cultures -C. difficile PCR  Suspected acute on chronic kidney disease: Creatinine from 10/01/2014 up at 3.9, up from 1.041 year ago. The time course of this elevation is also unclear. Renal ultrasound would be low yield given body  habitus. BUN/creatinine ratio more suggestive of an intrinsic renal process. Patient has had adequate access to food and drink. -Urine electrolytes pending -Nephrology consult in morning for further evaluation and possible biopsy -NS 75 ml/hr   Non-anion gap  metabolic acidosis: Bicarbonate of 16. Likely consistent with history of diarrhea.  Hypertension: Patient is on amlodipine 5 mg daily, hydralazine 25 mg 3 times a day, although patient not taking his home medications. Patient states that he does not have problems with hypertension at home and only has elevated blood pressures when in contact with healthcare. -Home amlodipine 5 mg daily -hold other home medications.  Type 2 diabetes: Diabetes since age 35. Patient is on metformin 500 mg daily, Januvia 100 mg. recently acquired hemoglobin A1c from December 2015 of 10.4. Patient states that he has been off his medications for approximately 5 months. Elevated urine microalbumin to creatinine ratio of 7700. Patient not on an ACE inhibitor as an outpatient. -Sliding-scale insulin with nighttime correction -Will further evaluate requirement for insulin and consider starting basal insulin tomorrow. -consult to diabetes coordinator  History of septic arthritis of left shoulder: Complicated by Group B streptococcal bacteremia. Large shoulder effusion with multiple small abscesses and pyomyositis along with an abscess in the biceps muscle. Status post I and D 08/17/2013 and continued on IV ceftriaxone and Augmentin. No current signs of infection upon exam.  Morbid obesity: Body habitus with major limitations on further diagnostic workup regarding sources of infection. Albumin of 1.8. -Consider option of bariatric surgery as an outpatient.  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 No signs of active disease on exam.  Diet: card modified Prophylaxis: heparin sq and scds Code:  full  Dispo: Disposition is deferred at this time, awaiting improvement of current medical problems. Anticipated discharge in approximately 3 day(s).   The patient does have a current PCP (No primary care provider on file.) ([facility]) and does need an [facility] hospital follow-up appointment after discharge.  The patient does have transportation limitations that hinder transportation to clinic appointments.  Signed: Luan Moore, M.D., Ph.D. Internal Medicine Teaching Service, PGY-1 10/03/2014, 6:30 PM

## 2014-10-03 NOTE — ED Notes (Signed)
Pt arrives EMs from home called to come to ED for evaluation of elevated WBC. Pt offers no compliants. Denies recent fever, cough, no open wounds. Pt awake, alert, oriented x4.

## 2014-10-03 NOTE — ED Provider Notes (Signed)
CSN: [id number]     Arrival date & time 10/03/14  1313 History   First MD Initiated Contact with Patient 10/03/14 1316     Chief Complaint  Patient presents with  . Abnormal Lab     HPI Patient has been in his normal state of health over the past several days.  He was seen in the primary care office yesterday and was noted to have a leukocytosis and new renal insufficiency.  He was called by the resident service to return to the hospital for hospital admission.  Patient denies nausea vomiting.  No fevers or chills.  Reports normal appetite.  No urinary complaints.  No abdominal pain or back pain.  He has no symptoms at all.   Past Medical History  Diagnosis Date  . Shortness of breath     with exertion  . PONV (postoperative nausea and vomiting)   . Diabetes mellitus without complication   . Anxiety   . Mental disorder   . Depression   . Sleep apnea     done at [facility].  Marland Kitchen Pneumonia   . GERD (gastroesophageal reflux disease)   . Constipation   . Neuropathy     feet, hands  . Anginal pain     chest pressure has increased in the past few days; patient denied  on 09/20/12  . Obesity   . Hx MRSA infection    Past Surgical History  Procedure Laterality Date  . I&d extremity  12/22/2011    Procedure: IRRIGATION AND DEBRIDEMENT EXTREMITY;  Surgeon: Sharmon Revere, MD;  Location: [place];  Service: Orthopedics;  Laterality: Left;  . I&d extremity  12/24/2011    Procedure: IRRIGATION AND DEBRIDEMENT EXTREMITY;  Surgeon: Sharmon Revere, MD;  Location: [place];  Service: Orthopedics;  Laterality: Left;  Irrigation and debridement left foot.  . I&d extremity  12/26/2011    Procedure: IRRIGATION AND DEBRIDEMENT EXTREMITY;  Surgeon: Sharmon Revere, MD;  Location: [place];  Service: Orthopedics;  Laterality: Left;  . I&d extremity  12/28/2011    Procedure: IRRIGATION AND DEBRIDEMENT EXTREMITY;  Surgeon: Sharmon Revere, MD;  Location: [place];  Service: Orthopedics;  Laterality: Left;  I&D Left  Foot;Application of  Wound Vac.to dorsal and plantar areas of left foot  . Eye surgery    . Toe surgery      Bone removed- 2 toe right  . Wisdom tooth extraction    . Amputation  09/22/2012    Procedure: AMPUTATION FOOT;  Surgeon: Newt Minion, MD;  Location: [place];  Service: Orthopedics;  Laterality: Left;  Left Midfoot Amputation  . Shoulder arthroscopy Left 08/18/2013    Procedure: ARTHROSCOPY SHOULDER and subacromial decompresion and debreidment of abcess;  Surgeon: Newt Minion, MD;  Location: [place];  Service: Orthopedics;  Laterality: Left;  . Toe amputation Right 09/19/2012    RTFOOT 5TH TOE              DR DUDA  . Amputation Right 09/19/2013    Procedure: AMPUTATION RAY- right;  Surgeon: Newt Minion, MD;  Location: [place];  Service: Orthopedics;  Laterality: Right;  Right Foot 5th Ray Amputation   History reviewed. No pertinent family history. History  Substance Use Topics  . Smoking status: Never Smoker   . Smokeless tobacco: Never Used  . Alcohol Use: No    Review of Systems  All other systems reviewed and are negative.     Allergies  Sulfonamide derivatives  Home  Medications   Prior to Admission medications   Medication Sig Start Date End Date Taking? Authorizing Provider  amLODipine (NORVASC) 5 MG tablet Take 1 tablet (5 mg total) by mouth daily. Patient not taking: Reported on 10/03/2014 10/01/14 10/01/15  Otho Bellows, MD  Blood Glucose Monitoring Suppl (ONE TOUCH ULTRA MINI) W/DEVICE KIT Check blood sugar one time a day 10/01/14   Otho Bellows, MD  glucose blood (ONE TOUCH ULTRA TEST) test strip Check blood sugar one time a day 10/01/14   Otho Bellows, MD  hydrALAZINE (APRESOLINE) 25 MG tablet Take 1 tablet (25 mg total) by mouth 3 (three) times daily. Patient not taking: Reported on 10/01/2014 08/23/13   Janece Canterbury, MD  metFORMIN (GLUCOPHAGE XR) 500 MG 24 hr tablet Take 1 tablet (500 mg total) by mouth daily with breakfast. Patient not taking:  Reported on 10/03/2014 10/01/14 10/01/15  Otho Bellows, MD  morphine (MS CONTIN) 30 MG 12 hr tablet Take 1 tablet (30 mg total) by mouth every 12 (twelve) hours. Patient not taking: Reported on 10/01/2014 08/23/13   Janece Canterbury, MD  [facility] LANCETS FINE MISC Check blood sugar one time a day 10/01/14   Otho Bellows, MD  oxyCODONE-acetaminophen (PERCOCET/ROXICET) 5-325 MG per tablet Take 1-2 tablets by mouth every 4 (four) hours as needed for moderate pain. 08/23/13   Janece Canterbury, MD  senna-docusate (SENOKOT-S) 8.6-50 MG per tablet Take 1 tablet by mouth 2 (two) times daily as needed for constipation. 08/02/13   Sheila Oats, MD  silver sulfADIAZINE (SILVADENE) 1 % cream Apply 1 application topically daily. 08/02/13   Sheila Oats, MD  sitaGLIPtin (JANUVIA) 100 MG tablet Take 100 mg by mouth daily.    Historical Provider, MD   BP 182/89 mmHg  Pulse 97  Temp(Src) 97.7 F (36.5 C) (Oral)  Resp 24  SpO2 100% Physical Exam  Constitutional: He is oriented to person, place, and time. He appears well-developed and well-nourished.  Morbidly obese  HENT:  Head: Normocephalic and atraumatic.  Eyes: EOM are normal.  Neck: Normal range of motion.  Cardiovascular: Normal rate, regular rhythm, normal heart sounds and intact distal pulses.   Pulmonary/Chest: Effort normal and breath sounds normal. No respiratory distress.  Abdominal: Soft. He exhibits no distension. There is no tenderness.  Musculoskeletal: Normal range of motion.  Neurological: He is alert and oriented to person, place, and time.  Skin: Skin is warm and dry.  Psychiatric: He has a normal mood and affect. Judgment normal.  Nursing note and vitals reviewed.   ED Course  Procedures (including critical care time) Labs Review Labs Reviewed  CBC WITH DIFFERENTIAL - Abnormal; Notable for the following:    WBC 17.9 (*)    RBC 3.35 (*)    Hemoglobin 9.4 (*)    HCT 29.5 (*)    Neutrophils Relative % 82 (*)     Neutro Abs 14.6 (*)    Lymphocytes Relative 10 (*)    All other components within normal limits  CULTURE, BLOOD (ROUTINE X 2)  CULTURE, BLOOD (ROUTINE X 2)  COMPREHENSIVE METABOLIC PANEL  CK  TSH   BUN  Date Value Ref Range Status  10/01/2014 37* 6 - 23 mg/dL Final  09/19/2013 11 6 - 23 mg/dL Final  08/22/2013 24* 6 - 23 mg/dL Final  08/20/2013 19 6 - 23 mg/dL Final   CREAT  Date Value Ref Range Status  10/01/2014 3.90* 0.50 - 1.35 mg/dL Final   CREATININE,  SER  Date Value Ref Range Status  09/19/2013 1.04 0.50 - 1.35 mg/dL Final  08/22/2013 1.91* 0.50 - 1.35 mg/dL Final  08/20/2013 1.80* 0.50 - 1.35 mg/dL Final  08/18/2013 1.30 0.50 - 1.35 mg/dL Final       Imaging Review No results found.   EKG Interpretation None      MDM   Final diagnoses:  Renal insufficiency    Patient and patient's family refusing any workup in emergency department.  They feels though there to be admitted to the hospital by the internal medicine service and are refusing all care until seen by the resident service.  We'll contact them for admission given his new renal insufficiency.    Hoy Morn, MD 10/03/14 [phone]

## 2014-10-03 NOTE — Addendum Note (Signed)
Addended by: Otho Bellows on: 10/03/2014 06:25 PM   Modules accepted: Orders, Medications

## 2014-10-04 ENCOUNTER — Inpatient Hospital Stay (HOSPITAL_COMMUNITY): Payer: PRIVATE HEALTH INSURANCE

## 2014-10-04 DIAGNOSIS — N179 Acute kidney failure, unspecified: Secondary | ICD-10-CM | POA: Diagnosis present

## 2014-10-04 DIAGNOSIS — J189 Pneumonia, unspecified organism: Secondary | ICD-10-CM

## 2014-10-04 LAB — RETICULOCYTES
RBC.: 3.29 MIL/uL — ABNORMAL LOW (ref 4.22–5.81)
RETIC COUNT ABSOLUTE: 49.4 10*3/uL (ref 19.0–186.0)
RETIC CT PCT: 1.5 % (ref 0.4–3.1)

## 2014-10-04 LAB — GLUCOSE, CAPILLARY
GLUCOSE-CAPILLARY: 333 mg/dL — AB (ref 70–99)
GLUCOSE-CAPILLARY: 285 mg/dL — AB (ref 70–99)
Glucose-Capillary: 243 mg/dL — ABNORMAL HIGH (ref 70–99)
Glucose-Capillary: 292 mg/dL — ABNORMAL HIGH (ref 70–99)
Glucose-Capillary: 338 mg/dL — ABNORMAL HIGH (ref 70–99)

## 2014-10-04 LAB — IRON AND TIBC
Iron: 29 ug/dL — ABNORMAL LOW (ref 42–135)
Saturation Ratios: 20 % (ref 20–55)
TIBC: 146 ug/dL — ABNORMAL LOW (ref 215–435)
UIBC: 117 ug/dL — AB (ref 125–400)

## 2014-10-04 LAB — CLOSTRIDIUM DIFFICILE BY PCR: Toxigenic C. Difficile by PCR: NEGATIVE

## 2014-10-04 MED ORDER — AZITHROMYCIN 500 MG PO TABS
500.0000 mg | ORAL_TABLET | Freq: Every day | ORAL | Status: DC
  Administered 2014-10-04 – 2014-10-07 (×4): 500 mg via ORAL
  Filled 2014-10-04 (×4): qty 1

## 2014-10-04 MED ORDER — AMLODIPINE BESYLATE 10 MG PO TABS
10.0000 mg | ORAL_TABLET | Freq: Every day | ORAL | Status: DC
  Administered 2014-10-04 – 2014-10-07 (×4): 10 mg via ORAL
  Filled 2014-10-04 (×4): qty 1

## 2014-10-04 MED ORDER — SODIUM BICARBONATE 650 MG PO TABS
1300.0000 mg | ORAL_TABLET | Freq: Two times a day (BID) | ORAL | Status: DC
  Administered 2014-10-04 – 2014-10-06 (×4): 1300 mg via ORAL
  Filled 2014-10-04 (×5): qty 2

## 2014-10-04 MED ORDER — DIPHENHYDRAMINE HCL 25 MG PO CAPS
25.0000 mg | ORAL_CAPSULE | Freq: Once | ORAL | Status: AC
  Administered 2014-10-04: 25 mg via ORAL
  Filled 2014-10-04: qty 1

## 2014-10-04 MED ORDER — CEFTRIAXONE SODIUM IN DEXTROSE 40 MG/ML IV SOLN
2.0000 g | INTRAVENOUS | Status: DC
  Administered 2014-10-04: 2 g via INTRAVENOUS
  Filled 2014-10-04: qty 50

## 2014-10-04 NOTE — Progress Notes (Addendum)
INITIAL NUTRITION ASSESSMENT  DOCUMENTATION CODES Per approved criteria  -Morbid Obesity   INTERVENTION: Encourage a healthful diet.  NUTRITION DIAGNOSIS: Food and nutrition-related knowledge deficit related to diet as evidenced by HgbA1C of 10.4 and weight trends.   Goal: Pt to meet >/= 90% of their estimated nutrition needs   Monitor:  PO intake, weight trends, labs, I/O's  Reason for Assessment: MD consult for assessment of nutrition requirements/status  38 y.o. male  Admitting Dx: Renal insufficiency  ASSESSMENT: Pt with PMH of HTN, morbid obesity, DM who p/w lab abnormalities from [facility] appointment. He was found to have a creatinine of 3.9 and leukocytosis of 17.1. Pt complains of abd pain- RLQ and LLQ which he describes as spasms.   Pt reports having a good appetite currently and PTA at home eating 3 full meals a day. Pt reports he has been seeing a dietitian at an outpatient clinic and has been trying to follow a healthful diet. Pt was educated on continuing a healthful diet emphasizing on fruits and vegetables, whole grains, fiber, and lean proteins. RD also encouraged strong compliance of a diabetic diet.  Low albumin was noted. When a patient presents with low albumin, it is likely skewed due to the acute inflammatory response.  Unless it is suspected that patient had poor PO intake or malnutrition prior to admission, then RD should not be consulted solely for low albumin. Note that low albumin is no longer used to diagnose malnutrition; [place] uses the new malnutrition guidelines published by the American Society for Parenteral and Enteral Nutrition (A.S.P.E.N.) and the Academy of Nutrition and Dietetics (AND).    Pt with no observed signs of significant fat or muscle mass loss.  Labs:Low sodium, CO2, calcium, and GFR. High BUN and creatinine.  Height: Ht Readings from Last 1 Encounters:  10/03/14 5\' 11"  (1.803 m)    Weight: Wt Readings from Last 1 Encounters:   10/03/14 460 lb (208.655 kg)    Ideal Body Weight: 169 lbs -- adjusted  % Ideal Body Weight: 267%  Wt Readings from Last 10 Encounters:  10/03/14 460 lb (208.655 kg)  09/20/13 404 lb (183.253 kg)  08/18/13 411 lb 13.1 oz (186.8 kg)  07/25/13 408 lb 8.2 oz (185.3 kg)  09/23/12 404 lb 5.2 oz (183.4 kg)  09/21/12 404 lb 4.8 oz (183.389 kg)  12/25/11 384 lb 14.8 oz (174.6 kg)  08/19/10 368 lb (166.924 kg)  07/03/10 362 lb 2.1 oz (164.262 kg)  02/23/10 385 lb (174.635 kg)    Usual Body Weight: 400 lbs  % Usual Body Weight: 115%  BMI:  Body mass index is 64.19 kg/(m^2). 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 amLODipine  5 mg Oral Daily  . azithromycin  500 mg Oral Daily  . cefTRIAXone (ROCEPHIN)  IV  2 g Intravenous Q24H  . heparin  5,000 Units Subcutaneous 3 times per day  . insulin aspart  0-5 Units Subcutaneous QHS  . insulin aspart  0-9 Units Subcutaneous TID WC    Continuous Infusions:   Past Medical History  Diagnosis Date  . Shortness of breath     with exertion  . PONV (postoperative nausea and vomiting)   . Diabetes mellitus without complication   . Anxiety   . Mental disorder   . Depression   . Sleep apnea     done at [facility].   Marland Kitchen Pneumonia   . GERD (gastroesophageal reflux disease)   . Constipation   . Neuropathy     feet, hands  . Anginal pain     chest pressure has increased in the past few days; patient denied  on 09/20/12  . Obesity   . Hx MRSA infection     Past Surgical History  Procedure Laterality Date  . I&d extremity  12/22/2011    Procedure: IRRIGATION AND DEBRIDEMENT EXTREMITY;  Surgeon: Sharmon Revere, MD;  Location: [place];  Service: Orthopedics;  Laterality: Left;  . I&d extremity  12/24/2011    Procedure: IRRIGATION AND DEBRIDEMENT EXTREMITY;  Surgeon: Sharmon Revere, MD;  Location: [place];  Service: Orthopedics;  Laterality: Left;  Irrigation and debridement left foot.  . I&d extremity  12/26/2011    Procedure: IRRIGATION AND DEBRIDEMENT EXTREMITY;  Surgeon: Sharmon Revere, MD;  Location: [place];  Service: Orthopedics;  Laterality: Left;  . I&d extremity  12/28/2011    Procedure: IRRIGATION AND DEBRIDEMENT EXTREMITY;  Surgeon: Sharmon Revere, MD;  Location: [place];  Service: Orthopedics;  Laterality: Left;  I&D Left Foot;Application of  Wound Vac.to dorsal and plantar areas of left foot  . Eye surgery    . Toe surgery      Bone removed- 2 toe right  . Wisdom tooth extraction    . Amputation  09/22/2012    Procedure: AMPUTATION FOOT;  Surgeon: Newt Minion, MD;  Location: [place];  Service: Orthopedics;  Laterality: Left;  Left Midfoot Amputation  . Shoulder arthroscopy Left 08/18/2013    Procedure: ARTHROSCOPY SHOULDER and subacromial decompresion and debreidment of abcess;  Surgeon: Newt Minion, MD;  Location: [place];  Service: Orthopedics;  Laterality: Left;  . Toe amputation Right 09/19/2012    RTFOOT 5TH TOE              DR DUDA  . Amputation Right 09/19/2013    Procedure: AMPUTATION RAY- right;  Surgeon: Newt Minion, MD;  Location: [place];  Service: Orthopedics;  Laterality: Right;  Right Foot 5th Ray Amputation    Kallie Locks, MS, RD, LDN Pager # [phone] After hours/ weekend pager #  [phone]

## 2014-10-04 NOTE — Progress Notes (Signed)
Inpatient Diabetes Program Recommendations  AACE/ADA: New Consensus Statement on Inpatient Glycemic Control (2013)  Target Ranges:  Prepandial:   less than 140 mg/dL      Peak postprandial:   less than 180 mg/dL (1-2 hours)      Critically ill patients:  140 - 180 mg/dL    Results for BAYLER, GEHRIG (MRN [id number]) as of 10/04/2014 12:23  Ref. Range 10/01/2014 15:58 10/03/2014 23:16 10/04/2014 07:30 10/04/2014 11:33  Glucose-Capillary Latest Range: 70-99 mg/dL 275 (H) 333 (H) 285 (H) 243 (H)   Met with patient and his mother.  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 He stated "my sugars are coming down".  He states that the reason he hasn't taken diabetes medications recently is because he couldn't get refills because he didn't have a family doctor that he could get to.  He states he's now seeing family practice at [facility] and is able to see the MDs here because he can be transported by ambulance. "My blood sugars will come down when I start taking meds".  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   Gentry Fitz, RN, BA, MHA, CDE Diabetes Coordinator Inpatient Diabetes Program  [phone] (Team Pager) [phone] [facility]) 10/04/2014 12:35 PM

## 2014-10-04 NOTE — Progress Notes (Signed)
Utilization review completed. Camauri Fleece, RN, BSN. 

## 2014-10-04 NOTE — Progress Notes (Signed)
Approximately 10 min after completion of Rocephin infusion pt began to c/o itching. Writer noticed fine red bumps down the middle of pt's back, no further distress or discomfort noted. Paged Dr. Raelene Bott to alert him of issue. Will wait for further orders.

## 2014-10-04 NOTE — Evaluation (Addendum)
Occupational Therapy Evaluation Patient Details Name: Adrian Khan MRN: [id number] DOB: 1975-11-03 Today's Date: 10/04/2014    History of Present Illness 38 y/o male with PMH of HTN, morbid obesity, DM who p/w lab abnormalities from [facility] appointment. Pt was seen in [facility] yesterday and was establishing care. He was found to have a creatinine of 3.9 and leukocytosis of 17.1. Patient denies fevers, cough, vomiting. Pt did complain of diarrhea * 2 weeks. Pt is lactose intolerant but continues to eat dairy. Pt complains of abd pain- which he describes as spasms.    Clinical Impression   Patient admitted with above. Patient set-up for UB ADLs and up to total assist for LB ADLs PTA. Patient currently functioning at a +2 level for BADLs. Please see OT problem list below. Feel patient will benefit from acute OT to increase overall independence in the areas of ADLs & functional mobility and in order to safely discharge to venue listed below.     Follow Up Recommendations  CIR    Equipment Recommendations   (Defer to next level/venue of care)    Recommendations for Other Services Rehab consult     Precautions / Restrictions Precautions Precautions: Fall Restrictions Weight Bearing Restrictions: No      Mobility Bed Mobility  See PT evaluation  Transfers  Recommend mechanical lift at this time    Balance  See PT evaluation     ADL Overall ADL's : Needs assistance/impaired Eating/Feeding: Set up   Grooming: Set up Grooming Details (indicate cue type and reason): bed level Upper Body Bathing: Total assistance   Lower Body Bathing: +2 for physical assistance Lower Body Bathing Details (indicate cue type and reason): +2 for bed mobility of rolling left <> right Upper Body Dressing : Total assistance Upper Body Dressing Details (indicate cue type and reason): Up to +2 for safety while seated EOB or for bed mobility if supine Lower Body Dressing: +2 for physical assistance      Toilet Transfer Details (indicate cue type and reason): activity did not occur, believe patient will require mechanical lift for transfers at this time       Tub/Shower Transfer Details (indicate cue type and reason): activity did not occur, believe patient will require mechanical lift for transfers at this time   General ADL Comments: Patient overall +2 for ADL tasks. Patient limited by increased pain and decreased overall strength and endurance. PTA, patient required up to total assist for LB ADLs, that which his mother would assist him with. Patient states he was able to perform UB ADLs with set-up assistance. Patient presented with decreased sitting balance/tolerance/endurance EOB. Believe patient will benefit from additional CIR.      Vision  No apparent deficits. Patient states he wears glasses for driving.    Perception Perception Perception Tested?: No   Praxis Praxis Praxis tested?: Within functional limits    Pertinent Vitals/Pain Pain Assessment: 0-10 Pain Score: 7  Pain Location: middle of back Pain Descriptors / Indicators: Constant Pain Intervention(s): Monitored during session     Hand Dominance  (Patient states he is ambidextrous)   Extremity/Trunk Assessment Upper Extremity Assessment Upper Extremity Assessment: Generalized weakness;LUE deficits/detail LUE Deficits / Details: Patient with difficulty performing grip and release, patient with complaints of some discomfort in LUE/hand   Lower Extremity Assessment Lower Extremity Assessment: Defer to PT evaluation   Cervical / Trunk Assessment Cervical / Trunk Assessment:  (difficult to assess secondary to air mattress, patient presented with left lateral lean and was  unable to fully sit up on EOB. May be due to body habitus as well? )   Communication Communication Communication: No difficulties   Cognition Arousal/Alertness: Awake/alert Behavior During Therapy: WFL for tasks assessed/performed Overall  Cognitive Status: Within Functional Limits for tasks assessed              Home Living Family/patient expects to be discharged to:: Private residence Living Arrangements: Parent Available Help at Discharge: Family;Available 24 hours/day Type of Home: House Home Access: Stairs to enter [employer] of Steps: 3 Entrance Stairs-Rails: Can reach both Home Layout: Two level;Able to live on main level with bedroom/bathroom     Bathroom Shower/Tub: Tub/shower unit;Curtain   Bathroom Toilet: Standard Bathroom Accessibility: No   Home Equipment: Environmental consultant - 2 wheels;Wheelchair - power;Bedside commode;Hand held shower head          Prior Functioning/Environment Level of Independence: Needs assistance  Gait / Transfers Assistance Needed: supervision>occasional min assist (according to patient he was this level ~2 months ago) ADL's / Homemaking Assistance Needed: Supervision for UB ADLs, up to total for LB ADLs, Up to min assist for functional transfers Communication / Swallowing Assistance Needed: Independent      OT Diagnosis: Generalized weakness;Acute pain   OT Problem List: Decreased strength;Decreased activity tolerance;Impaired balance (sitting and/or standing);Decreased coordination;Decreased safety awareness;Decreased knowledge of use of DME or AE;Obesity;Pain   OT Treatment/Interventions: Self-care/ADL training;Therapeutic exercise;Energy conservation;DME and/or AE instruction;Therapeutic activities;Patient/family education;Balance training    OT Goals(Current goals can be found in the care plan section) Acute Rehab OT Goals Patient Stated Goal: none stated OT Goal Formulation: With patient/family Time For Goal Achievement: 10/18/14 Potential to Achieve Goals: Good ADL Goals Pt Will Perform Grooming: with supervision;sitting (EOB) Pt Will Perform Upper Body Bathing: with supervision;sitting (EOB) Pt Will Perform Upper Body Dressing: with supervision;sitting  (EOB) Pt Will Transfer to Toilet: with min assist;bedside commode;stand pivot transfer  OT Frequency: Min 2X/week   Barriers to D/C:    none known at this time        (Addendum): Co-Treatment completed secondary to complexity of impairments and for patient and therapists safety.    End of Session    Activity Tolerance: Patient limited by pain Patient left: in bed;with family/visitor present   Time: 1594-5859 OT Time Calculation (min): 38 min Charges:  OT General Charges $OT Visit: 1 Procedure OT Evaluation $Initial OT Evaluation Tier I: 1 Procedure OT Treatments $Self Care/Home Management : 8-22 mins Ermine Spofford 10/04/2014, 11:55 AM

## 2014-10-04 NOTE — Consult Note (Signed)
Reason for Consult:Elevated Cr Referring Physician: Dr. Marzella Schlein is an 38 y.o. male.  HPI: 37 yr male with hx 23 hr DM, massive obesity, hs PVD with L TMA, R2nd and 5 th toe amp,OSA, Depression, anxiety, hx septic arthritis L shoulder, neuropathy, now with admit after found to have ^ WBC, and Cr of 3.9.  Does not know of hx of kidney dz. And no FH of renal dz.  Hx of 1 renal stone about 2 yr ago, and several UTIs.  No prob with voiding.  Does have ankle edema, but denies SOB with exertion, or hs.  No fevers or chills but chronically weak.  Denies NSAID or bp meds.  Allergic to sulfa and Ceftriaxone (just found out).   No blood in urine.  Had V x 3 in a mon.  No D.  L sided abdm pain.  Hx of Cr in 2s in 10/14 but was 1.04 in 12/14.  Cr 46yrago was in .5 to .8 range.  Has metalbolic acidosis also. Constitutional: weak, recently . 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 Shortness of breath     with exertion  . PONV (postoperative nausea and vomiting)   . Diabetes mellitus without complication   . Anxiety   . Mental disorder   . Depression   . [facility]     [facility]  .Marland KitchenPneumonia   . GERD (gastroesophageal reflux disease)   . Constipation   . Neuropathy     feet, hands  . Anginal pain     chest pressure has increased in the past few days; patient denied  on 09/20/12  . Obesity   . Hx MRSA infection     Past Surgical History  Procedure  Laterality Date  . I&d extremity  12/22/2011    Procedure: IRRIGATION AND DEBRIDEMENT EXTREMITY;  Surgeon: ASharmon Revere MD;  [facility]  Service: [place]opedics;  Laterality: Left;  . I&d extremity  12/24/2011    Procedure: IRRIGATION AND DEBRIDEMENT EXTREMITY;  Surgeon: ASharmon Revere MD;  [place] [facility]  Service: Orthopedics;  Laterality: Left;  Irrigation and debridement left foot.  . I&d extremity  12/26/2011    Procedure: IRRIGATION AND DEBRIDEMENT EXTREMITY;  Surgeon: ASharmon Revere MD;  [place] [facility]  Service: Orthopedics;  Laterality: Left;  . I&d extremity  12/28/2011    Procedure: IRRIGATION AND DEBRIDEMENT EXTREMITY;  Surgeon: ASharmon Revere MD;  [facility] [facility]  Service: Orthopedics;  Laterality: Left;  I&D Left Foot;Application of  Wound Vac.to dorsal and plantar areas of left foot  . Eye surgery    . Toe surgery      Bone removed- 2 toe right  . Wisdom tooth extraction    . Amputation  09/22/2012    Procedure: AMPUTATION FOOT;  Surgeon: MNewt Minion MD;  [place] [facility]  Service: Orthopedics;  Laterality: Left;  Left Midfoot Amputation  . Shoulder arthroscopy Left 08/18/2013    Procedure: ARTHROSCOPY SHOULDER and subacromial decompresion and debreidment of abcess;  Surgeon: MIllene Regulus  Sharol Given, MD;  Location: [place];  Service: Orthopedics;  Laterality: Left;  . Toe amputation Right 09/19/2012    RTFOOT 5TH TOE              DR DUDA  . Amputation Right 09/19/2013    Procedure: AMPUTATION RAY- right;  Surgeon: Newt Minion, MD;  Location: [place];  Service: Orthopedics;  Laterality: Right;  Right Foot 5th Ray Amputation    History reviewed. No pertinent family history.  Social History:  reports that he has never smoked. He has never used smokeless tobacco. He reports that he does not drink alcohol or use illicit drugs.  Allergies:  Allergies  Allergen Reactions  . Sulfonamide Derivatives Other (See Comments)    cramps  . Rocephin [Ceftriaxone] Hives and Itching     Medications:  I have reviewed the patient's current medications. Prior to Admission:  Prescriptions prior to admission  Medication Sig Dispense Refill Last Dose  . amLODipine (NORVASC) 5 MG tablet Take 1 tablet (5 mg total) by mouth daily. (Patient not taking: Reported on 10/03/2014) 30 tablet 1 Not Taking at Unknown time  . Blood Glucose Monitoring Suppl (ONE TOUCH ULTRA MINI) W/DEVICE KIT Check blood sugar one time a day 1 each 1   . glucose blood (ONE TOUCH ULTRA TEST) test strip Check blood sugar one time a day 100 each 8   . metFORMIN (GLUCOPHAGE XR) 500 MG 24 hr tablet Take 1 tablet (500 mg total) by mouth daily with breakfast. (Patient not taking: Reported on 10/03/2014) 30 tablet 1 Not Taking at Unknown time  . ONETOUCH DELICA LANCETS FINE MISC Check blood sugar one time a day 100 each 5   . senna-docusate (SENOKOT-S) 8.6-50 MG per tablet Take 1 tablet by mouth 2 (two) times daily as needed for constipation.   Not Taking at Unknown time  . silver sulfADIAZINE (SILVADENE) 1 % cream Apply 1 application topically daily.   Not Taking at Unknown time     Results for orders placed or performed during the hospital encounter of 10/03/14 (from the past 48 hour(s))  Blood culture (routine x 2)     Status: None (Preliminary result)   Collection Time: 10/03/14  2:00 PM  Result Value Ref Range   Specimen Description BLOOD RIGHT ANTECUBITAL    Special Requests BOTTLES DRAWN AEROBIC AND ANAEROBIC 10CC    Culture  Setup Time      10/03/2014 18:21 Performed at [facility] TO DATE CULTURE WILL BE HELD FOR 5 DAYS BEFORE ISSUING A FINAL NEGATIVE REPORT Performed at [facility]    Report Status PENDING   CBC with Differential     Status: Abnormal   Collection Time: 10/03/14  2:02 PM  Result Value Ref Range   WBC 17.9 (H) 4.0 - 10.5 K/uL   RBC 3.35 (L) 4.22 - 5.81 MIL/uL   Hemoglobin 9.4 (L) 13.0 - 17.0 g/dL   HCT  29.5 (L) 39.0 - 52.0 %   MCV 88.1 78.0 - 100.0 fL   MCH 28.1 26.0 - 34.0 pg   MCHC 31.9 30.0 - 36.0 g/dL   RDW 12.9 11.5 - 15.5 %   Platelets 358 150 - 400 K/uL   Neutrophils Relative % 82 (H) 43 - 77 %   Neutro Abs 14.6 (H) 1.7 - 7.7 K/uL   Lymphocytes Relative 10 (L) 12 -  46 %   Lymphs Abs 1.8 0.7 - 4.0 K/uL   Monocytes Relative 5 3 - 12 %   Monocytes Absolute 0.9 0.1 - 1.0 K/uL   Eosinophils Relative 3 0 - 5 %   Eosinophils Absolute 0.5 0.0 - 0.7 K/uL   Basophils Relative 0 0 - 1 %   Basophils Absolute 0.0 0.0 - 0.1 K/uL  Comprehensive metabolic panel     Status: Abnormal   Collection Time: 10/03/14  2:02 PM  Result Value Ref Range   Sodium 136 (L) 137 - 147 mEq/L   Potassium 4.7 3.7 - 5.3 mEq/L   Chloride 106 96 - 112 mEq/L   CO2 17 (L) 19 - 32 mEq/L   Glucose, Bld 304 (H) 70 - 99 mg/dL   BUN 37 (H) 6 - 23 mg/dL   Creatinine, Ser 3.83 (H) 0.50 - 1.35 mg/dL   Calcium 6.4 (LL) 8.4 - 10.5 mg/dL    Comment: CRITICAL RESULT CALLED TO, READ BACK BY AND VERIFIED WITH: A REABOLD,RN 1527 10/03/14 D BRADLEY    Total Protein 7.4 6.0 - 8.3 g/dL   Albumin 1.8 (L) 3.5 - 5.2 g/dL   AST 8 0 - 37 U/L   ALT 7 0 - 53 U/L   Alkaline Phosphatase 110 39 - 117 U/L   Total Bilirubin <0.2 (L) 0.3 - 1.2 mg/dL   GFR calc non Af Amer 19 (L) >90 mL/min   GFR calc Af Amer 22 (L) >90 mL/min    Comment: (NOTE) The eGFR has been calculated using the CKD EPI equation. This calculation has not been validated in all clinical situations. eGFR's persistently <90 mL/min signify possible Chronic Kidney Disease.    Anion gap 13 5 - 15  Blood culture (routine x 2)     Status: None (Preliminary result)   Collection Time: 10/03/14  2:15 PM  Result Value Ref Range   Specimen Description BLOOD RIGHT HAND    Special Requests BOTTLES DRAWN AEROBIC AND ANAEROBIC 5CC    Culture  Setup Time      10/03/2014 18:21 Performed at [facility] NO GROWTH TO DATE  CULTURE WILL BE HELD FOR 5 DAYS BEFORE ISSUING A FINAL NEGATIVE REPORT Performed at [facility]    Report Status PENDING   Urinalysis, Routine w reflex microscopic     Status: Abnormal   Collection Time: 10/03/14  6:02 PM  Result Value Ref Range   Color, Urine YELLOW YELLOW   APPearance CLEAR CLEAR   Specific Gravity, Urine 1.018 1.005 - 1.030   pH 6.5 5.0 - 8.0   Glucose, UA 500 (A) NEGATIVE mg/dL   Hgb urine dipstick SMALL (A) NEGATIVE   Bilirubin Urine NEGATIVE NEGATIVE   Ketones, ur NEGATIVE NEGATIVE mg/dL   Protein, ur >300 (A) NEGATIVE mg/dL   Urobilinogen, UA 0.2 0.0 - 1.0 mg/dL   Nitrite NEGATIVE NEGATIVE   Leukocytes, UA NEGATIVE NEGATIVE  Sodium, urine, random     Status: None   Collection Time: 10/03/14  6:02 PM  Result Value Ref Range   Sodium, Ur 80 mEq/L  Creatinine, urine, random     Status: None   Collection Time: 10/03/14  6:02 PM  Result Value Ref Range   Creatinine, Urine 51.70 mg/dL  Urine rapid drug screen (hosp performed)     Status: None   Collection Time: 10/03/14  6:02 PM  Result Value Ref Range   Opiates NONE DETECTED NONE DETECTED   Cocaine NONE DETECTED NONE DETECTED   Benzodiazepines NONE DETECTED NONE DETECTED   Amphetamines NONE DETECTED NONE DETECTED   Tetrahydrocannabinol NONE DETECTED NONE DETECTED   Barbiturates NONE DETECTED NONE DETECTED    Comment:        DRUG SCREEN FOR MEDICAL PURPOSES ONLY.  IF CONFIRMATION IS NEEDED FOR ANY PURPOSE, NOTIFY LAB WITHIN 5 DAYS.        LOWEST DETECTABLE LIMITS FOR URINE DRUG SCREEN Drug Class       Cutoff (ng/mL) Amphetamine      1000 Barbiturate      200 Benzodiazepine   449 Tricyclics       675 Opiates          300 Cocaine          300 THC              50   Urine microscopic-add on     Status: Abnormal   Collection Time: 10/03/14  6:02 PM  Result Value Ref Range   Squamous Epithelial / LPF RARE RARE   WBC, UA 0-2 <3 WBC/hpf   RBC / HPF 3-6 <3 RBC/hpf   Bacteria, UA RARE RARE    Casts HYALINE CASTS (A) NEGATIVE  Glucose, capillary     Status: Abnormal   Collection Time: 10/03/14 11:16 PM  Result Value Ref Range   Glucose-Capillary 333 (H) 70 - 99 mg/dL  Glucose, capillary     Status: Abnormal   Collection Time: 10/04/14  7:30 AM  Result Value Ref Range   Glucose-Capillary 285 (H) 70 - 99 mg/dL  Clostridium Difficile by PCR     Status: None   Collection Time: 10/04/14 10:00 AM  Result Value Ref Range   C difficile by pcr NEGATIVE NEGATIVE  Glucose, capillary     Status: Abnormal   Collection Time: 10/04/14 11:33 AM  Result Value Ref Range   Glucose-Capillary 243 (H) 70 - 99 mg/dL    Dg Chest 2 View  10/03/2014   CLINICAL DATA:  Leukocytosis.  EXAM: CHEST  2 VIEW  COMPARISON:  07/24/2013.  FINDINGS: Exam detail is significantly diminished secondary to patient's body habitus. The heart size and mediastinal contours are within normal limits. Both lungs are clear. The visualized skeletal structures are unremarkable.  IMPRESSION: Significantly diminished exam detail due to patient's body habitus.  No evidence for pneumonia.   Electronically Signed   By: Kerby Moors M.D.   On: 10/03/2014 22:42    ROS Blood pressure 170/72, pulse 99, temperature 97.9 F (36.6 C), temperature source Oral, resp. rate 16, height 5' 11"  (1.803 m), weight 208.655 kg (460 lb), SpO2 96 %. Physical Exam Physical Examination: General appearance - lethargic, reticent to answer questions Mental status - drowsy, as above Eyes - funduscopic exam abnormal DM retinopathy with H&E and scarrs Mouth - mucous membranes moist, pharynx normal without lesions Neck - adenopathy noted PCL Lymphatics - posterior cervical nodes Chest - decreased air entry noted bialt Heart - S1 and S2 normal, systolic murmur FF6/3 at 2nd left intercostal space Abdomen - massive obesity, pos bs, hardened large >30 cm dia area L side abdm Musculoskeletal - L TMA, R 2nd toe gone distally, R 5th ray gone Extremities -  as above, 3+ edema Skin - evidence recent scratching, trophic changes in feet, many dark macules  Assessment/Plan: 1 Elevated Cr  Not sure this is acute.  Suspect chronic.  Has  long term nephrotic level proteinuria.  No evidence to inflam in urine.  Most likely this is DM vs FSG of obesity and I favor DM.  May have component of ATN with NS and that would be only reversibility.  Need to image kidneys.  Would try U/S and if not able, CT without contrast.  Need to ^ hs bicarb and would add Na bicarb 1300 mg po bid.  Has some vol xs but would watch at this time.  Acidemia and low HB indirect indic of chronic dz 2 Abdm induration superficially lateral ?? Calciphylaxis need to check PTH 3 Massive obesity need to address 4. Anemia  Please check Fe.  Suspect is renal in origin 5. Anxiety/depression  Unless overcomes some of issues, not candidate for long term therapy Rec:  Try U/S to  eval kidneys, if cannot do, do CT PTH, Fe/TIBC, Start Na bicarb 1365m po bid, Instruct in 2gm Na, 2 gm K, low phos diet.  Get SPEP, UPEP  Shataya Winkles L 10/04/2014, 2:38 PM

## 2014-10-04 NOTE — Consult Note (Signed)
ANTIBIOTIC CONSULT NOTE-Preliminary  Pharmacy Consult for  Ceftriaxone Indication:  HCAP vs Bacteremia  Allergies  Allergen Reactions  . Sulfonamide Derivatives Other (See Comments)    cramps    Patient Measurements: Height: 5' 11"  (180.3 cm) (stated) Weight: (!) 460 lb (208.655 kg) IBW/kg (Calculated) : 75.3  Body mass index is 64.19 kg/(m^2).   Vital Signs: Temp: 98 F (36.7 C) (12/18 0500) Temp Source: Oral (12/18 0500) BP: 160/77 mmHg (12/18 0500) Pulse Rate: 94 (12/18 0500)  Labs:  Recent Labs  10/01/14 1543 10/01/14 1557 10/03/14 1402 10/03/14 1802  WBC  --  17.1* 17.9*  --   HGB  --  10.8* 9.4*  --   PLT  --  405* 358  --   LABCREA 57.8  --   --  51.70  CREATININE  --  3.90* 3.83*  --     Estimated Creatinine Clearance: 48.5 mL/min (by C-G formula based on Cr of 3.83).  Microbiology: Recent Results (from the past 720 hour(s))  Blood culture (routine x 2)     Status: None (Preliminary result)   Collection Time: 10/03/14  2:00 PM  Result Value Ref Range Status   Specimen Description BLOOD RIGHT ANTECUBITAL  Final   Special Requests BOTTLES DRAWN AEROBIC AND ANAEROBIC 10CC  Final   Culture  Setup Time   Final    10/03/2014 18:21 Performed at [facility]    Culture   Final           BLOOD CULTURE RECEIVED NO GROWTH TO DATE CULTURE WILL BE HELD FOR 5 DAYS BEFORE ISSUING A FINAL NEGATIVE REPORT Performed at [facility]    Report Status PENDING  Incomplete  Blood culture (routine x 2)     Status: None (Preliminary result)   Collection Time: 10/03/14  2:15 PM  Result Value Ref Range Status   Specimen Description BLOOD RIGHT HAND  Final   Special Requests BOTTLES DRAWN AEROBIC AND ANAEROBIC 5CC  Final   Culture  Setup Time   Final    10/03/2014 18:21 Performed at [facility]    Culture   Final           BLOOD CULTURE RECEIVED NO GROWTH TO DATE CULTURE WILL BE HELD FOR 5 DAYS BEFORE ISSUING A FINAL NEGATIVE REPORT Performed  at [facility]    Report Status PENDING  Incomplete   Medical History: Past Medical History  Diagnosis Date  . Shortness of breath     with exertion  . PONV (postoperative nausea and vomiting)   . Diabetes mellitus without complication   . Anxiety   . Mental disorder   . Depression   . Sleep apnea     done at [facility]  Marland Kitchen Pneumonia   . GERD (gastroesophageal reflux disease)   . Constipation   . Neuropathy     feet, hands  . Anginal pain     chest pressure has increased in the past few days; patient denied  on 09/20/12  . Obesity   . Hx MRSA infection     Current Medication[s] Include: Medication PTA: Prescriptions prior to admission  Medication Sig Dispense Refill Last Dose  . amLODipine (NORVASC) 5 MG tablet Take 1 tablet (5 mg total) by mouth daily. (Patient not taking: Reported on 10/03/2014) 30 tablet 1 Not Taking at Unknown time  . Blood Glucose Monitoring Suppl (ONE TOUCH ULTRA MINI) W/DEVICE KIT Check blood sugar one time a day 1 each 1   .  glucose blood (ONE TOUCH ULTRA TEST) test strip Check blood sugar one time a day 100 each 8   . metFORMIN (GLUCOPHAGE XR) 500 MG 24 hr tablet Take 1 tablet (500 mg total) by mouth daily with breakfast. (Patient not taking: Reported on 10/03/2014) 30 tablet 1 Not Taking at Unknown time  . ONETOUCH DELICA LANCETS FINE MISC Check blood sugar one time a day 100 each 5   . senna-docusate (SENOKOT-S) 8.6-50 MG per tablet Take 1 tablet by mouth 2 (two) times daily as needed for constipation.   Not Taking at Unknown time  . silver sulfADIAZINE (SILVADENE) 1 % cream Apply 1 application topically daily.   Not Taking at Unknown time    Scheduled:  Scheduled:  . amLODipine  5 mg Oral Daily  . azithromycin  500 mg Oral Daily  . heparin  5,000 Units Subcutaneous 3 times per day  . insulin aspart  0-5 Units Subcutaneous QHS  . insulin aspart  0-9 Units Subcutaneous TID WC   Infusion[s]: Infusions:   Antibiotic[s]: Anti-infectives     Start     Dose/Rate Route Frequency Ordered Stop   10/04/14 1000  azithromycin (ZITHROMAX) tablet 500 mg     500 mg Oral Daily 10/04/14 0843        Assessment:  38 y/o M w/PMH of HTN, DM, Morbid Obesity [208.7 kg, BMI 64], Scr 3.9, WBC 17.1. Admitted w/AKI and suspected infection of unk etiology.  Patient will be treated for suspected HCAP vs Bacteremia.  Patient will receive Azithromycin 500 mg PO daily.  Pharmacy asked to begin Ceftriaxone.  Goal of Therapy:  Ceftriaxone dosing for indication, patient parameters, and renal function:   eradication of infection.  Plan:  1. Begin Ceftriaxone 2 gm IV q 24 hours. 2. Monitor renal function, WBC, fever curve, any cultures/sensitivities, and clinical progression.  Cherylyn Sundby, Craig Guess,  Pharm.D     10/04/2014,8:58 AM

## 2014-10-04 NOTE — Progress Notes (Signed)
Received prescreen request from OT for possible inpatient rehab consult and I have reviewed pt's case. Pt has [employer] and based on pt's current diagnosis, it is not likely that his insurance would give authorization for inpatient rehab. We recommend pursuing home with home health support or skilled nursing if needed.   Thanks.  Nanetta Batty, PT Rehabilitation Admissions Coordinator [phone]

## 2014-10-04 NOTE — Progress Notes (Signed)
Subjective:  Patient with no acute complaints this morning. Patient does state that he has nearing the end of this patient's regarding phlebotomy and other injections. 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  Objective: Vital signs in last 24 hours: Filed Vitals:   10/03/14 1742 10/03/14 2025 10/04/14 0500 10/04/14 0910  BP:  172/78 160/77 172/83  Pulse:  105 94 98  Temp:  97.9 F (36.6 C) 98 F (36.7 C) 98.4 F (36.9 C)  TempSrc:  Oral Oral Oral  Resp:  18 18 14   Height: 5\' 11"  (1.803 m)     Weight:  460 lb (208.655 kg)    SpO2:  99% 99% 98%   Weight change:   Intake/Output Summary (Last 24 hours) at 10/04/14 1129 Last data filed at 10/04/14 1100  Gross per 24 hour  Intake    240 ml  Output   1550 ml  Net  -1310 ml   General: resting in bed, extremely obese HEENT: PERRL, EOMI, no scleral icterus, unable to assess JVP Cardiac: RRR, no rubs, murmurs or gallops Pulm: clear to auscultation bilaterally, moving normal volumes of air Abd: soft, nontender, obese Ext: warm and well perfused, no pedal edema Neuro: alert and oriented X3, cranial nerves II-XII grossly intact Skin: no rashes or lesions noted Psych: appropriate affect, somewhat tangential  Lab Results: Basic Metabolic Panel:  Recent Labs Lab 10/01/14 1557 10/03/14 1402  NA 137 136*  K 4.6 4.7  CL 109 106  CO2 16* 17*  GLUCOSE 272* 304*  BUN 37* 37*  CREATININE 3.90* 3.83*  CALCIUM 6.5* 6.4*   Liver Function Tests:  Recent Labs Lab 10/01/14 1557 10/03/14 1402  AST 8 8  ALT <8 7  ALKPHOS 101 110  BILITOT 0.3 <0.2*  PROT 6.5 7.4  ALBUMIN 2.4* 1.8*   No results for input(s): LIPASE, AMYLASE in the last 168 hours. No results for input(s): AMMONIA in the last 168 hours. CBC:  Recent Labs Lab 10/01/14 1557 10/03/14 1402  WBC 17.1* 17.9*  NEUTROABS  --  14.6*  HGB 10.8* 9.4*  HCT 33.5* 29.5*  MCV 88.6 88.1    PLT 405* 358   Cardiac Enzymes: No results for input(s): CKTOTAL, CKMB, CKMBINDEX, TROPONINI in the last 168 hours. BNP: No results for input(s): PROBNP in the last 168 hours. D-Dimer: No results for input(s): DDIMER in the last 168 hours. CBG:  Recent Labs Lab 10/01/14 1558 10/03/14 2316 10/04/14 0730  GLUCAP 275* 333* 285*   Hemoglobin A1C:  Recent Labs Lab 10/01/14 1606  HGBA1C 10.4   Fasting Lipid Panel:  Recent Labs Lab 10/01/14 1557  CHOL 147  HDL 35*  LDLCALC 97  TRIG 76  CHOLHDL 4.2   Thyroid Function Tests: No results for input(s): TSH, T4TOTAL, FREET4, T3FREE, THYROIDAB in the last 168 hours. Coagulation: No results for input(s): LABPROT, INR in the last 168 hours. Anemia Panel: No results for input(s): VITAMINB12, FOLATE, FERRITIN, TIBC, IRON, RETICCTPCT in the last 168 hours. Urine Drug Screen: Drugs of Abuse     Component Value Date/Time   LABOPIA NONE DETECTED 10/03/2014 1802   COCAINSCRNUR NONE DETECTED 10/03/2014 1802   LABBENZ NONE DETECTED 10/03/2014 1802   AMPHETMU NONE DETECTED 10/03/2014 1802   THCU NONE DETECTED 10/03/2014 1802   LABBARB NONE DETECTED 10/03/2014 1802    Alcohol Level: No results for input(s): ETH in the last 168 hours. Urinalysis:  Recent Labs  Lab 10/03/14 1802  COLORURINE YELLOW  LABSPEC 1.018  PHURINE 6.5  GLUCOSEU 500*  HGBUR SMALL*  BILIRUBINUR NEGATIVE  KETONESUR NEGATIVE  PROTEINUR >300*  UROBILINOGEN 0.2  NITRITE NEGATIVE  LEUKOCYTESUR NEGATIVE   Micro Results: Recent Results (from the past 240 hour(s))  Blood culture (routine x 2)     Status: None (Preliminary result)   Collection Time: 10/03/14  2:00 PM  Result Value Ref Range Status   Specimen Description BLOOD RIGHT ANTECUBITAL  Final   Special Requests BOTTLES DRAWN AEROBIC AND ANAEROBIC 10CC  Final   Culture  Setup Time   Final    10/03/2014 18:21 Performed at [facility]    Culture   Final           BLOOD CULTURE RECEIVED  NO GROWTH TO DATE CULTURE WILL BE HELD FOR 5 DAYS BEFORE ISSUING A FINAL NEGATIVE REPORT Performed at [facility]    Report Status PENDING  Incomplete  Blood culture (routine x 2)     Status: None (Preliminary result)   Collection Time: 10/03/14  2:15 PM  Result Value Ref Range Status   Specimen Description BLOOD RIGHT HAND  Final   Special Requests BOTTLES DRAWN AEROBIC AND ANAEROBIC 5CC  Final   Culture  Setup Time   Final    10/03/2014 18:21 Performed at [facility]    Culture   Final           BLOOD CULTURE RECEIVED NO GROWTH TO DATE CULTURE WILL BE HELD FOR 5 DAYS BEFORE ISSUING A FINAL NEGATIVE REPORT Performed at [facility]    Report Status PENDING  Incomplete   Studies/Results: Dg Chest 2 View  10/03/2014   CLINICAL DATA:  Leukocytosis.  EXAM: CHEST  2 VIEW  COMPARISON:  07/24/2013.  FINDINGS: Exam detail is significantly diminished secondary to patient's body habitus. The heart size and mediastinal contours are within normal limits. Both lungs are clear. The visualized skeletal structures are unremarkable.  IMPRESSION: Significantly diminished exam detail due to patient's body habitus.  No evidence for pneumonia.   Electronically Signed   By: Kerby Moors M.D.   On: 10/03/2014 22:42   Medications: I have reviewed the patient's current medications. Scheduled Meds: . amLODipine  10 mg Oral Daily  . azithromycin  500 mg Oral Daily  . cefTRIAXone (ROCEPHIN)  IV  2 g Intravenous Q24H  . heparin  5,000 Units Subcutaneous 3 times per day  . insulin aspart  0-5 Units Subcutaneous QHS  . insulin aspart  0-9 Units Subcutaneous TID WC   Continuous Infusions:  PRN Meds:.senna-docusate Assessment/Plan: Principal Problem:   Renal insufficiency Active Problems:   Type II diabetes mellitus with complication, uncontrolled   Leukocytosis   Morbid obesity, BMI unknown   HTN (hypertension)   AKI (acute kidney injury)  Patient is a 38 year old male with  a history of hypertension, type 1 diabetes, group B strep bacteremia, chronic kidney disease, osteomyelitis of the right foot, objective sleep apnea, anemia of chronic illness, and chronic pain who presents to the hospital as a direct admission for leukocytosis and acute renal insufficency.  Leukocytosis potentially from community acquired pneumonia: White count is 17.1 up from 12.9 one year ago, with a left shift. Concurrent thrombocythemia, likely reactive. Patient's diarrhea has somewhat resolved since admission making C. difficile colitis less likely. Patient is reporting some cough and dyspnea, though chest x-ray was limited for evaluation because of body habitus. Urinalysis unremarkable for infection.  -Will empirically  treat patient for community-acquired pneumonia with ceftriaxone and azithromycin -Blood cultures with no growth to date -C. difficile PCR still pending  Suspected acute on chronic kidney disease: Creatinine from 10/01/2014 up at 3.9, up from 1.041 year ago. The time course of this elevation is also unclear. Renal ultrasound would be low yield given body habitus. BUN/creatinine ratio more suggestive of an intrinsic renal process. FENa is 4.4%. -Nephrology consult -NS 75 ml/hr   Non-anion gap metabolic acidosis: Bicarbonate of 16. Likely consistent with history of diarrhea.  Hypertension: Patient is on amlodipine 5 mg daily, hydralazine 25 mg 3 times a day, although patient not taking his home medications. Patient states that he does not have problems with hypertension at home and only has elevated blood pressures when in contact with healthcare. Patient has been in the 410V to 013H systolic since admission.  -Increase home amlodipine to 10 mg daily -hold other home medications  Type 2 diabetes: Diabetes since age 29. Patient is on metformin 500 mg daily, Januvia 100 mg. Recently acquired hemoglobin A1c from December 2015 of 10.4. Patient states that he has been off his  medications for approximately 5 months. Elevated urine microalbumin to creatinine ratio of 7700. Patient not on an ACE inhibitor as an outpatient. -Sliding-scale insulin with nighttime correction -Will further evaluate requirement for insulin and consider starting basal insulin tomorrow. -consult to diabetes coordinator  History of septic arthritis of left shoulder: Complicated by Group B streptococcal bacteremia. Large shoulder effusion with multiple small abscesses and pyomyositis along with an abscess in the biceps muscle. Status post I and D 08/17/2013 and continued on IV ceftriaxone and Augmentin. No current signs of infection upon exam.  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 No signs of active disease on exam.  Morbid obesity: Body habitus with major limitations on further diagnostic workup regarding sources of infection. Albumin of 1.8. -Consider option of bariatric surgery as an outpatient.  Diet: card modified Prophylaxis: heparin sq and scds Code: full  Dispo: Disposition is deferred at this time, awaiting improvement of current medical problems. Anticipated discharge in approximately 2 day(s).   The patient does have a current PCP (No primary care provider on file.) ([place]) and does need an [facility] hospital follow-up appointment after discharge.  The patient does have transportation limitations that hinder transportation to clinic appointments.  .Services Needed at time of discharge: Y = Yes, Blank = No PT:   OT:   RN:   Equipment:   Other:     LOS: 1 day   Luan Moore, MD 10/04/2014, 11:29 AM

## 2014-10-04 NOTE — Evaluation (Signed)
Physical Therapy Evaluation Patient Details Name: Adrian Khan MRN: [id number] DOB: 17-Aug-1976 Today's Date: 10/04/2014   History of Present Illness  38 y/o male with PMH of HTN, morbid obesity, DM who p/w lab abnormalities from [facility] appointment. Pt was seen in [facility] yesterday and was establishing care. He was found to have a creatinine of 3.9 and leukocytosis of 17.1. Patient denies fevers, cough, vomiting. Pt did complain of diarrhea * 2 weeks. Pt is lactose intolerant but continues to eat dairy. Pt complains of abd pain- which he describes as spasms.   Clinical Impression  Pt admitted with above diagnosis. Pt currently with functional limitations due to the deficits listed below (see PT Problem List). At the time of PT eval pt was able to perform transfers to/from EOB with +2 physical assist as well as safety. Pt declined pursuing further mobility OOB. Recommended a bariatric recliner and was delivered at end of session. Pt will benefit from skilled PT to increase their independence and safety with mobility to allow discharge to the venue listed below.      Follow Up Recommendations CIR;Supervision/Assistance - 24 hour    Equipment Recommendations  None recommended by PT    Recommendations for Other Services Rehab consult     Precautions / Restrictions Precautions Precautions: Fall Restrictions Weight Bearing Restrictions: No      Mobility  Bed Mobility Overal bed mobility: Needs Assistance;+2 for physical assistance Bed Mobility: Rolling;Supine to Sit;Sit to Supine Rolling: Mod assist   Supine to sit: Max assist;+2 for physical assistance;+2 for safety/equipment Sit to supine: Max assist;+2 for physical assistance;+2 for safety/equipment   General bed mobility comments: Pt was able to perform rolling in bed with use of bed rails and HHA for linen change under him. HOB elevated and heavy use of bed rails for supine to sit transfer. +2 assist for scooting of R hip out with  draw sheet.   Transfers                 General transfer comment: Pt declined further mobility at this time.   Ambulation/Gait                Stairs            Wheelchair Mobility    Modified Rankin (Stroke Patients Only)       Balance Overall balance assessment: Needs assistance Sitting-balance support: Feet supported;No upper extremity supported Sitting balance-Leahy Scale: Poor Sitting balance - Comments: Pt requires UE support for seated balance. Pt states that he leans to the right because of pressure and pain in his abdomen.  Postural control: Right lateral lean;Posterior lean                                   Pertinent Vitals/Pain Pain Assessment: 0-10 Pain Score: 7  Pain Location: Middle of back Pain Descriptors / Indicators: Constant Pain Intervention(s): Monitored during session    Home Living Family/patient expects to be discharged to:: Private residence Living Arrangements: Parent Available Help at Discharge: Family;Available 24 hours/day Type of Home: House Home Access: Stairs to enter Entrance Stairs-Rails: Can reach both Entrance Stairs-Number of Steps: 3 Home Layout: Two level;Able to live on main level with bedroom/bathroom Home Equipment: Gilford Rile - 2 wheels;Wheelchair - power;Bedside commode;Hand held shower head      Prior Function Level of Independence: Needs assistance   Gait / Transfers Assistance Needed: supervision>occasional min assist (according to  patient he was this level ~2 months ago)  ADL's / Homemaking Assistance Needed: Supervision for UB ADLs, up to total for LB ADLs, Up to min assist for functional transfers        Hand Dominance   Dominant Hand:  (Pt states he is ambidextrous)    Extremity/Trunk Assessment   Upper Extremity Assessment: Defer to OT evaluation       LUE Deficits / Details: Patient with difficulty performing grip and release, patient with complaints of some discomfort in  LUE/hand   Lower Extremity Assessment: Generalized weakness (Appears to have transmet amp on L and some toe/ray amp on R)      Cervical / Trunk Assessment: Normal;Other exceptions  Communication   Communication: No difficulties  Cognition Arousal/Alertness: Awake/alert Behavior During Therapy: WFL for tasks assessed/performed Overall Cognitive Status: Within Functional Limits for tasks assessed                      General Comments      Exercises        Assessment/Plan    PT Assessment Patient needs continued PT services  PT Diagnosis Difficulty walking;Generalized weakness;Acute pain   PT Problem List Decreased strength;Decreased range of motion;Decreased activity tolerance;Decreased balance;Decreased mobility;Decreased safety awareness;Decreased knowledge of use of DME;Decreased knowledge of precautions;Obesity;Pain;Decreased skin integrity  PT Treatment Interventions DME instruction;Gait training;Stair training;Functional mobility training;Therapeutic activities;Therapeutic exercise;Neuromuscular re-education;Patient/family education   PT Goals (Current goals can be found in the Care Plan section) Acute Rehab PT Goals Patient Stated Goal: none stated PT Goal Formulation: With patient/family Time For Goal Achievement: 10/18/14 Potential to Achieve Goals: Fair    Frequency Min 3X/week   Barriers to discharge   Pt and his mother have been managing at home with supplemental support coming in - HHPT/OT/RN some over the past year    Co-evaluation PT/OT/SLP Co-Evaluation/Treatment: Yes Reason for Co-Treatment: Complexity of the patient's impairments (multi-system involvement);For patient/therapist safety PT goals addressed during session: Mobility/safety with mobility;Balance         End of Session   Activity Tolerance: Patient limited by pain Patient left: in bed;with call bell/phone within reach;with family/visitor present Nurse Communication: Mobility  status;Need for lift equipment         Time: 1013-1051 PT Time Calculation (min) (ACUTE ONLY): 38 min   Charges:   PT Evaluation $Initial PT Evaluation Tier I: 1 Procedure PT Treatments $Therapeutic Activity: 8-22 mins   PT G Codes:          Rolinda Roan 10/04/2014, 2:29 PM  Rolinda Roan, PT, DPT Acute Rehabilitation Services Pager: [phone]

## 2014-10-05 ENCOUNTER — Inpatient Hospital Stay (HOSPITAL_COMMUNITY): Payer: PRIVATE HEALTH INSURANCE

## 2014-10-05 LAB — CBC WITH DIFFERENTIAL/PLATELET
BASOS ABS: 0 10*3/uL (ref 0.0–0.1)
Basophils Relative: 0 % (ref 0–1)
EOS ABS: 0.4 10*3/uL (ref 0.0–0.7)
EOS PCT: 3 % (ref 0–5)
HEMATOCRIT: 30.4 % — AB (ref 39.0–52.0)
Hemoglobin: 9.6 g/dL — ABNORMAL LOW (ref 13.0–17.0)
LYMPHS ABS: 1.7 10*3/uL (ref 0.7–4.0)
LYMPHS PCT: 11 % — AB (ref 12–46)
MCH: 28.6 pg (ref 26.0–34.0)
MCHC: 31.6 g/dL (ref 30.0–36.0)
MCV: 90.5 fL (ref 78.0–100.0)
MONO ABS: 0.7 10*3/uL (ref 0.1–1.0)
Monocytes Relative: 5 % (ref 3–12)
Neutro Abs: 12.8 10*3/uL — ABNORMAL HIGH (ref 1.7–7.7)
Neutrophils Relative %: 81 % — ABNORMAL HIGH (ref 43–77)
Platelets: 327 10*3/uL (ref 150–400)
RBC: 3.36 MIL/uL — ABNORMAL LOW (ref 4.22–5.81)
RDW: 13 % (ref 11.5–15.5)
WBC: 15.6 10*3/uL — AB (ref 4.0–10.5)

## 2014-10-05 LAB — BASIC METABOLIC PANEL
Anion gap: 14 (ref 5–15)
BUN: 39 mg/dL — ABNORMAL HIGH (ref 6–23)
CALCIUM: 6.7 mg/dL — AB (ref 8.4–10.5)
CO2: 16 meq/L — AB (ref 19–32)
CREATININE: 3.97 mg/dL — AB (ref 0.50–1.35)
Chloride: 106 mEq/L (ref 96–112)
GFR calc Af Amer: 21 mL/min — ABNORMAL LOW (ref >90)
GFR, EST NON AFRICAN AMERICAN: 18 mL/min — AB (ref >90)
GLUCOSE: 292 mg/dL — AB (ref 70–99)
Potassium: 4.7 mEq/L (ref 3.7–5.3)
Sodium: 136 mEq/L — ABNORMAL LOW (ref 137–147)

## 2014-10-05 LAB — GLUCOSE, CAPILLARY
GLUCOSE-CAPILLARY: 283 mg/dL — AB (ref 70–99)
GLUCOSE-CAPILLARY: 260 mg/dL — AB (ref 70–99)
Glucose-Capillary: 291 mg/dL — ABNORMAL HIGH (ref 70–99)
Glucose-Capillary: 263 mg/dL — ABNORMAL HIGH (ref 70–99)

## 2014-10-05 LAB — FERRITIN: Ferritin: 251 ng/mL (ref 22–322)

## 2014-10-05 MED ORDER — INSULIN ASPART 100 UNIT/ML ~~LOC~~ SOLN: 5.0000 [IU] | Freq: Three times a day (TID) | SUBCUTANEOUS | Status: DC

## 2014-10-05 MED ORDER — INSULIN ASPART 100 UNIT/ML FLEXPEN
5.0000 [IU] | PEN_INJECTOR | Freq: Three times a day (TID) | SUBCUTANEOUS | Status: DC
  Administered 2014-10-05 – 2014-10-07 (×6): 5 [IU] via SUBCUTANEOUS
  Filled 2014-10-05: qty 3

## 2014-10-05 MED ORDER — INSULIN GLARGINE 100 UNITS/ML SOLOSTAR PEN
20.0000 [IU] | PEN_INJECTOR | Freq: Every day | SUBCUTANEOUS | Status: DC
  Administered 2014-10-05: 20 [IU] via SUBCUTANEOUS
  Filled 2014-10-05: qty 3

## 2014-10-05 NOTE — Progress Notes (Signed)
Subjective: Interval History: has no complaint .  Objective: Vital signs in last 24 hours: Temp:  [97.6 F (36.4 C)-98.8 F (37.1 C)] 98.4 F (36.9 C) (12/19 0414) Pulse Rate:  [90-99] 98 (12/19 0414) Resp:  [16-18] 18 (12/19 0414) BP: (158-170)/(70-89) 158/89 mmHg (12/19 0414) SpO2:  [96 %-100 %] 100 % (12/19 0414) Weight:  [208.655 kg (460 lb)] 208.655 kg (460 lb) (12/18 2129) Weight change: 0 kg (0 lb)  Intake/Output from previous day: 12/18 0701 - 12/19 0700 In: 720 [P.O.:720] Out: 2975 [Urine:2975] Intake/Output this shift:    General appearance: alert and morbidly obese Resp: diminished breath sounds bilaterally Cardio: S1, S2 normal and systolic murmur: holosystolic 2/6, blowing at apex GI: massive with pannus.  induration and hyperpig L lat Extremities: edema 2+ edema  Multiple toe amp on R , TMA on L  Lab Results:  Recent Labs  10/03/14 1402 10/05/14 0420  WBC 17.9* 15.6*  HGB 9.4* 9.6*  HCT 29.5* 30.4*  PLT 358 327   BMET:  Recent Labs  10/03/14 1402 10/05/14 0420  NA 136* 136*  K 4.7 4.7  CL 106 106  CO2 17* 16*  GLUCOSE 304* 292*  BUN 37* 39*  CREATININE 3.83* 3.97*  CALCIUM 6.4* 6.7*   No results for input(s): PTH in the last 72 hours. Iron Studies:  Recent Labs  10/04/14 1550  IRON 29*  TIBC 146*  FERRITIN 251    Studies/Results: Dg Chest 2 View  10/03/2014   CLINICAL DATA:  Leukocytosis.  EXAM: CHEST  2 VIEW  COMPARISON:  07/24/2013.  FINDINGS: Exam detail is significantly diminished secondary to patient's body habitus. The heart size and mediastinal contours are within normal limits. Both lungs are clear. The visualized skeletal structures are unremarkable.  IMPRESSION: Significantly diminished exam detail due to patient's body habitus.  No evidence for pneumonia.   Electronically Signed   By: Kerby Moors M.D.   On: 10/03/2014 22:42    I have reviewed the patient's current medications.  Assessment/Plan: 1 CKD/AKI need to  image, vol xs, mild to mod  bp ^ useful to indic not low vol 2 DM needs insulin, discussed with him 3 massive obesity 4 Anemia Fe pending, will prob need epo 5 HPTH pending 6 Anxiety/depression  P W/U as discussed and outlined. Rec diet instruct in low Na, low phos, CHO restrict.    LOS: 2 days   Quanasia Defino L 10/05/2014,9:30 AM

## 2014-10-05 NOTE — Progress Notes (Signed)
Inpatient Diabetes Program Recommendations  AACE/ADA: New Consensus Statement on Inpatient Glycemic Control (2013)  Target Ranges:  Prepandial:   less than 140 mg/dL      Peak postprandial:   less than 180 mg/dL (1-2 hours)      Critically ill patients:  140 - 180 mg/dL  Results for Adrian Khan, Adrian Khan (MRN [id number]) as of 10/05/2014 12:56  Ref. Range 10/04/2014 11:33 10/04/2014 16:57 10/04/2014 21:34 10/05/2014 07:50 10/05/2014 12:24  Glucose-Capillary Latest Range: 70-99 mg/dL 243 (H) 292 (H) 338 (H) 283 (H) 260 (H)    Inpatient Diabetes Program Recommendations Insulin - Basal: add Lantus 20 units daily Correction (SSI): Novolog sensitive scale TID +HS scale Thank you  Raoul Pitch BSN, RN,CDE Inpatient Diabetes Coordinator [phone] (team pager)

## 2014-10-05 NOTE — Progress Notes (Signed)
Subjective: Pt seen and examined this AM.  No acute complaints.  Appetite is good.  Bladder function normal.  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  He is requesting insulin pens instead of needles.    Objective: Vital signs in last 24 hours: Filed Vitals:   10/04/14 1241 10/04/14 1714 10/04/14 2129 10/05/14 0414  BP: 170/72 161/70 163/79 158/89  Pulse: 99 90 98 98  Temp: 97.9 F (36.6 C) 97.6 F (36.4 C) 98.8 F (37.1 C) 98.4 F (36.9 C)  TempSrc: Oral Oral Oral Oral  Resp: 16 17 18 18   Height:      Weight:   208.655 kg (460 lb)   SpO2: 96% 98% 98% 100%   Weight change: 0 kg (0 lb)  Intake/Output Summary (Last 24 hours) at 10/05/14 0741 Last data filed at 10/05/14 0419  Gross per 24 hour  Intake    720 ml  Output   2975 ml  Net  -2255 ml   General: resting in bed, extremely obese Cardiac: RRR, no rubs, murmurs or gallops Pulm: clear to auscultation bilaterally, moving normal volumes of air Abd: soft, nontender, obese, area of induration on left side of abdomen Ext: warm and well perfused, no pedal edema Neuro: alert and oriented X3, responding appropriately, able to move voluntarily but movement limited by body habitus  Lab Results: Basic Metabolic Panel:  Recent Labs Lab 10/03/14 1402 10/05/14 0420  NA 136* 136*  K 4.7 4.7  CL 106 106  CO2 17* 16*  GLUCOSE 304* 292*  BUN 37* 39*  CREATININE 3.83* 3.97*  CALCIUM 6.4* 6.7*   Liver Function Tests:  Recent Labs Lab 10/01/14 1557 10/03/14 1402  AST 8 8  ALT <8 7  ALKPHOS 101 110  BILITOT 0.3 <0.2*  PROT 6.5 7.4  ALBUMIN 2.4* 1.8*   CBC:  Recent Labs Lab 10/03/14 1402 10/05/14 0420  WBC 17.9* 15.6*  NEUTROABS 14.6* 12.8*  HGB 9.4* 9.6*  HCT 29.5* 30.4*  MCV 88.1 90.5  PLT 358 327   CBG:  Recent Labs Lab 10/01/14 1558 10/03/14 2316 10/04/14 0730 10/04/14 1133 10/04/14 1657 10/04/14 2134  GLUCAP 275* 333* 285* 243* 292* 338*   Hemoglobin  A1C:  Recent Labs Lab 10/01/14 1606  HGBA1C 10.4   Fasting Lipid Panel:  Recent Labs Lab 10/01/14 1557  CHOL 147  HDL 35*  LDLCALC 97  TRIG 76  CHOLHDL 4.2   Anemia Panel:  Recent Labs Lab 10/04/14 1550  FERRITIN 251  TIBC 146*  IRON 29*  RETICCTPCT 1.5   Micro Results: Recent Results (from the past 240 hour(s))  Blood culture (routine x 2)     Status: None (Preliminary result)   Collection Time: 10/03/14  2:00 PM  Result Value Ref Range Status   Specimen Description BLOOD RIGHT ANTECUBITAL  Final   Special Requests BOTTLES DRAWN AEROBIC AND ANAEROBIC 10CC  Final   Culture  Setup Time   Final    10/03/2014 18:21 Performed at [facility]    Culture   Final           BLOOD CULTURE RECEIVED NO GROWTH TO DATE CULTURE WILL BE HELD FOR 5 DAYS BEFORE ISSUING A FINAL NEGATIVE REPORT Performed at [facility]    Report Status PENDING  Incomplete  Blood culture (routine x 2)     Status: None (Preliminary result)   Collection Time: 10/03/14  2:15 PM  Result Value Ref Range  Status   Specimen Description BLOOD RIGHT HAND  Final   Special Requests BOTTLES DRAWN AEROBIC AND ANAEROBIC 5CC  Final   Culture  Setup Time   Final    10/03/2014 18:21 Performed at [facility]    Culture   Final           BLOOD CULTURE RECEIVED NO GROWTH TO DATE CULTURE WILL BE HELD FOR 5 DAYS BEFORE ISSUING A FINAL NEGATIVE REPORT Performed at [facility]    Report Status PENDING  Incomplete  Clostridium Difficile by PCR     Status: None   Collection Time: 10/04/14 10:00 AM  Result Value Ref Range Status   C difficile by pcr NEGATIVE NEGATIVE Final   Studies/Results: Dg Chest 2 View  10/03/2014   CLINICAL DATA:  Leukocytosis.  EXAM: CHEST  2 VIEW  COMPARISON:  07/24/2013.  FINDINGS: Exam detail is significantly diminished secondary to patient's body habitus. The heart size and mediastinal contours are within normal limits. Both lungs are clear. The  visualized skeletal structures are unremarkable.  IMPRESSION: Significantly diminished exam detail due to patient's body habitus.  No evidence for pneumonia.   Electronically Signed   By: Kerby Moors M.D.   On: 10/03/2014 22:42   Medications: I have reviewed the patient's current medications. Scheduled Meds: . amLODipine  10 mg Oral Daily  . azithromycin  500 mg Oral Daily  . heparin  5,000 Units Subcutaneous 3 times per day  . insulin aspart  0-5 Units Subcutaneous QHS  . insulin aspart  0-9 Units Subcutaneous TID WC  . sodium bicarbonate  1,300 mg Oral BID   Continuous Infusions:  PRN Meds:.senna-docusate   Assessment/Plan: Patient is a 38 year old male with a history of hypertension, type 1 diabetes, group B strep bacteremia, chronic kidney disease, osteomyelitis of the right foot, objective sleep apnea, anemia of chronic illness, and chronic pain who presents to the hospital as a direct admission for leukocytosis and acute renal insufficency.  Leukocytosis potentially from community acquired pneumonia: WBC improving from 17 --> 15 after initiating abx.  Blood cx NGTD.  Afebrile.  Patient's diarrhea has somewhat resolved and cdiff negative.  Patient is reporting some cough and dyspnea, though chest x-ray was limited for evaluation because of body habitus. azithro and ceftriaxone started 12/18 for possible CAP but d/c ceftriaxone due to allergic rxn (hives and itching). - continue azithromycin - follow-up final blood cx - GI pathogen panel pending - monitor WBC and for development of fever  Acute on chronic kidney disease: Creatinine now 3.97, up from 1.041 year ago. The time course of this elevation is also unclear.  BUN/creatinine ratio more suggestive of an intrinsic renal process, however FENa is 4.4% suggestive of post-renal/obstructive etiology.  Renal US completed but limited due to body habitus.  Only the right kidney was visualized and it was unremarkable w/o hydronephrosis.   Appreciate nephrology recommendations.  - CT abd/pelvis w/o contrast ordered to attempt visualization of renal system - requesting nutrition education, specifically on limiting Na, K, phos and carbohydrates in the diet  Non-anion gap metabolic acidosis: Bicarbonate of 16. - sodium bicarb 1300mg  po BID  Anemia of chronic disease:  Fe 29 (low), TIBC 146 (low), Ferritin 251 (normal) c/w AoCD.   - may need EPO  Hypertension: Patient is on amlodipine 5 mg daily, hydralazine 25 mg 3 times a day, although patient not taking his home medications. Patient states that he does not have problems with hypertension at home and only  has elevated blood pressures when in contact with healthcare.  -Increase home amlodipine to 10 mg daily -can resume hydral if he continues to be hypertensive  Type 2 diabetes: Diabetes since age 3. A1c 10.4.  He has been on insulin in the past but oral medications recently.  He was not taking oral meds for several months prior to admission.  CBGs 260-283 today.  He is refusing the SSI that was ordered for him because of fear of needles. He is requesting insulin pens because "you can't see the needle."  I have spoken with DM educator and recommendations are appreciated. - start Lantus Solostar 20 units qHS tonight (very conservative 0.1 units/kg) and Novolog Flexpen 5 units TID wc (based on SSI-sensitive requirements for CBGs 250-300 which is where his CBGs have been) - continue CBG checks ac/hs - DM educator following  History of septic arthritis of left shoulder: Complicated by Group B streptococcal bacteremia. Large shoulder effusion with multiple small abscesses and pyomyositis along with an abscess in the biceps muscle. Status post I and D 08/17/2013 and continued on IV ceftriaxone and Augmentin. No current signs of infection upon exam.  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 No signs of active disease on  exam.  Morbid obesity: Body habitus with major limitations on further diagnostic workup regarding sources of infection. Albumin of 1.8.  Weight is 460 pounds. -Consider option of bariatric surgery as an outpatient.  Diet: renal/carb mod Prophylaxis: heparin sq (he is refusing injection) and SCDs Code: Full  Dispo: Disposition is deferred at this time, awaiting improvement of current medical problems. Anticipated discharge in approximately 2 day(s).   The patient does have a current PCP (No primary care provider on file.) ([place]) and does need an [facility] hospital follow-up appointment after discharge.  The patient does have transportation limitations that hinder transportation to clinic appointments.  .Services Needed at time of discharge: Y = Yes, Blank = No PT:   OT:   RN:   Equipment:   Other:     LOS: 2 days   Francesca Oman, DO 10/05/2014, 7:41 AM

## 2014-10-06 LAB — GLUCOSE, CAPILLARY
Glucose-Capillary: 156 mg/dL — ABNORMAL HIGH (ref 70–99)
Glucose-Capillary: 198 mg/dL — ABNORMAL HIGH (ref 70–99)
Glucose-Capillary: 237 mg/dL — ABNORMAL HIGH (ref 70–99)
Glucose-Capillary: 236 mg/dL — ABNORMAL HIGH (ref 70–99)

## 2014-10-06 MED ORDER — SODIUM CHLORIDE 0.9 % IV SOLN
510.0000 mg | Freq: Once | INTRAVENOUS | Status: AC
  Administered 2014-10-06: 510 mg via INTRAVENOUS
  Filled 2014-10-06: qty 17

## 2014-10-06 MED ORDER — INSULIN GLARGINE 100 UNITS/ML SOLOSTAR PEN
24.0000 [IU] | PEN_INJECTOR | Freq: Every day | SUBCUTANEOUS | Status: DC
  Administered 2014-10-06: 24 [IU] via SUBCUTANEOUS

## 2014-10-06 MED ORDER — SODIUM BICARBONATE 650 MG PO TABS
1300.0000 mg | ORAL_TABLET | Freq: Three times a day (TID) | ORAL | Status: DC
  Administered 2014-10-06 – 2014-10-07 (×4): 1300 mg via ORAL
  Filled 2014-10-06 (×5): qty 2

## 2014-10-06 NOTE — Progress Notes (Addendum)
Subjective: Pt seen and examined this AM.   He refused labs this AM because he says he only agreed to two days of lab draws.  He would prefer PICC.  Appetite/bowel/bladder normal.  Diarrhea has resolved.  He feels well.  Objective: Vital signs in last 24 hours: Filed Vitals:   10/05/14 0414 10/05/14 1800 10/05/14 2024 10/06/14 0500  BP: 158/89 161/81 144/81 146/66  Pulse: 98 101 61 94  Temp: 98.4 F (36.9 C) 98.5 F (36.9 C) 97.9 F (36.6 C) 97.5 F (36.4 C)  TempSrc: Oral Axillary Oral Oral  Resp: 18 21 18 18   Height:      Weight:   207.702 kg (457 lb 14.4 oz)   SpO2: 100% 100% 96% 94%   Weight change: -0.953 kg (-2 lb 1.6 oz)  Intake/Output Summary (Last 24 hours) at 10/06/14 1151 Last data filed at 10/06/14 1022  Gross per 24 hour  Intake    240 ml  Output   1775 ml  Net  -1535 ml   General: resting in bed, extremely obese; body habitus limits exam Cardiac: RRR, no rubs, murmurs or gallops Pulm: clear to auscultation bilaterally, moving normal volumes of air Abd: soft, nontender, obese, area of induration on left side of abdomen Ext: warm and well perfused, no pedal edema Neuro: alert and oriented X3, responding appropriately, able to move voluntarily but movement limited by body habitus  Lab Results: Basic Metabolic Panel:  Recent Labs Lab 10/03/14 1402 10/05/14 0420  NA 136* 136*  K 4.7 4.7  CL 106 106  CO2 17* 16*  GLUCOSE 304* 292*  BUN 37* 39*  CREATININE 3.83* 3.97*  CALCIUM 6.4* 6.7*   Liver Function Tests:  Recent Labs Lab 10/01/14 1557 10/03/14 1402  AST 8 8  ALT <8 7  ALKPHOS 101 110  BILITOT 0.3 <0.2*  PROT 6.5 7.4  ALBUMIN 2.4* 1.8*   CBC:  Recent Labs Lab 10/03/14 1402 10/05/14 0420  WBC 17.9* 15.6*  NEUTROABS 14.6* 12.8*  HGB 9.4* 9.6*  HCT 29.5* 30.4*  MCV 88.1 90.5  PLT 358 327   CBG:  Recent Labs Lab 10/05/14 0750 10/05/14 1224 10/05/14 1745 10/05/14 2021 10/06/14 0817 10/06/14 1141  GLUCAP 283* 260* 291*  263* 156* 198*   Hemoglobin A1C:  Recent Labs Lab 10/01/14 1606  HGBA1C 10.4   Fasting Lipid Panel:  Recent Labs Lab 10/01/14 1557  CHOL 147  HDL 35*  LDLCALC 97  TRIG 76  CHOLHDL 4.2   Anemia Panel:  Recent Labs Lab 10/04/14 1550  FERRITIN 251  TIBC 146*  IRON 29*  RETICCTPCT 1.5   Micro Results: Recent Results (from the past 240 hour(s))  Blood culture (routine x 2)     Status: None (Preliminary result)   Collection Time: 10/03/14  2:00 PM  Result Value Ref Range Status   Specimen Description BLOOD RIGHT ANTECUBITAL  Final   Special Requests BOTTLES DRAWN AEROBIC AND ANAEROBIC 10CC  Final   Culture  Setup Time   Final    10/03/2014 18:21 Performed at [facility]    Culture   Final           BLOOD CULTURE RECEIVED NO GROWTH TO DATE CULTURE WILL BE HELD FOR 5 DAYS BEFORE ISSUING A FINAL NEGATIVE REPORT Performed at [facility]    Report Status PENDING  Incomplete  Blood culture (routine x 2)     Status: None (Preliminary result)   Collection Time: 10/03/14  2:15 PM  Result Value Ref Range Status   Specimen Description BLOOD RIGHT HAND  Final   Special Requests BOTTLES DRAWN AEROBIC AND ANAEROBIC 5CC  Final   Culture  Setup Time   Final    10/03/2014 18:21 Performed at [facility]    Culture   Final           BLOOD CULTURE RECEIVED NO GROWTH TO DATE CULTURE WILL BE HELD FOR 5 DAYS BEFORE ISSUING A FINAL NEGATIVE REPORT Performed at [facility]    Report Status PENDING  Incomplete  Clostridium Difficile by PCR     Status: None   Collection Time: 10/04/14 10:00 AM  Result Value Ref Range Status   C difficile by pcr NEGATIVE NEGATIVE Final   Studies/Results: Ct Abdomen Pelvis Wo Contrast  10/05/2014   CLINICAL DATA:  Bilateral flank pain.  High creatinine.  EXAM: CT ABDOMEN AND PELVIS WITHOUT CONTRAST  TECHNIQUE: Multidetector CT imaging of the abdomen and pelvis was performed following the standard protocol without  IV contrast.  COMPARISON:  CT abdomen 07/24/2013  FINDINGS: Exam is suboptimal due to body habitus lack of IV contrast and patient motion.  Lower chest:  Lung bases are clear.  No pericardial fluid.  Hepatobiliary: Non IV contrast images demonstrate no focal hepatic lesion.  Pancreas: Pancreas is normal. No ductal dilatation. No pancreatic inflammation.  Spleen: Normal spleen.  Adrenals/urinary tract: No nephrolithiasis or ureterolithiasis. No obstructive uropathy.  Stomach/Bowel: Stomach, small bowel, and colon are normal.  Vascular/Lymphatic: Abdominal aorta is normal caliber. There is no retroperitoneal or periportal lymphadenopathy. No pelvic lymphadenopathy. There is atherosclerotic calcification of the SMA  Reproductive: Prostate gland is normal.  Musculoskeletal: No aggressive osseous lesion.  Other: No hernia.  No free fluid or abscess  IMPRESSION: 1. Suboptimal exam. 2. No nephrolithiasis, ureterolithiasis or obstructive uropathy identified. 3. No acute findings in the abdomen or pelvis.   Electronically Signed   By: Suzy Bouchard M.D.   On: 10/05/2014 19:50   US Renal Port  10/05/2014   CLINICAL DATA:  Acute renal failure.  EXAM: RENAL/URINARY TRACT ULTRASOUND COMPLETE  COMPARISON:  CT 07/24/2013  FINDINGS: Limited due to body habitus.  Right Kidney:  Length: 14.0 cm.  Poorly visualized.  No hydronephrosis.  Left Kidney:  Not able to be adequately visualized.  Bladder:  Appears normal for degree of bladder distention.  IMPRESSION: Limited exam due to body habitus.  The right kidney is unremarkable without hydronephrosis.  The left kidney is not able to be visualized.   Electronically Signed   By: Lovey Newcomer M.D.   On: 10/05/2014 12:17   Medications: I have reviewed the patient's current medications. Scheduled Meds: . amLODipine  10 mg Oral Daily  . azithromycin  500 mg Oral Daily  . heparin  5,000 Units Subcutaneous 3 times per day  . insulin aspart  5 Units Subcutaneous TID WC  . insulin  glargine  20 Units Subcutaneous Q2200  . sodium bicarbonate  1,300 mg Oral BID   Continuous Infusions:  PRN Meds:.senna-docusate   Assessment/Plan: Patient is a 38 year old male with a history of hypertension, type 1 diabetes, group B strep bacteremia, chronic kidney disease, osteomyelitis of the right foot, objective sleep apnea, anemia of chronic illness, and chronic pain who presents to the hospital as a direct admission for leukocytosis and acute renal insufficency.  Leukocytosis potentially from community acquired pneumonia: WBC improving, but he refused labs this AM.  Blood cx NGTD.  Afebrile.  Patient's diarrhea has  resolved and cdiff negative.  Continuing with azithro for possible CAP (stopped Rocephin due to allergic symptoms). - continue azithromycin - follow-up final blood cx - GI pathogen panel pending - monitor WBC and for development of fever  Acute on chronic kidney disease: Creatinine up to 3.97, up from 1.041 year ago. The time course of this elevation is also unclear.  BUN/creatinine ratio more suggestive of an intrinsic renal process, however FENa is 4.4% suggestive of post-renal/obstructive etiology.  Renal US completed but limited due to body habitus.  CT abdomen/pelvis - suboptimal exam, no stones or obstruction.   Appreciate nephrology recommendations.  - addressing DM, BP and diet - monitor BMP and I&Os - requesting nutrition education, specifically on limiting Na, K, phos and carbohydrates in the diet  Non-anion gap metabolic acidosis: most recent bicarb 16. - increase sodium bicarb 1300mg  po BID --> TID  Anemia of chronic disease:  Fe 29 (low), TIBC 146 (low), Ferritin 251 (normal) c/w AoCD.   - 510mg  IV x 1 per nephro recs; likely will need EPO - monitor hgb (though patient now refusing blood draws); nephrology says central PICC in the IJ is an option moving forward  Hypertension: Home meds include amlodipine 5 mg daily, hydralazine 25 mg 3 times a day, although  patient not taking his home medications. Patient states that he does not have problems with hypertension at home and only has elevated blood pressures when in contact with healthcare.  - continue with higher dose of amlodipine (10 mg daily) - can resume hydral if hypertensive  Type 2 diabetes: Diabetes since age 58. A1c 10.4.  He has been on insulin in the past but oral medications recently.  He was not taking oral meds for several months prior to admission.  CBGs 156-198 today.  He requested insulin pens because "you can't see the needle."  I spoke with DM educator and recommendations are appreciated. - increase Lantus Solostar from 20 units qHS to 24 units tonight and continue Novolog Flexpen 5 units TID wc (based on SSI-sensitive requirements for CBGs 250-300 which is where his CBGs have been) - continue CBG checks ac/hs - DM educator following  History of septic arthritis of left shoulder: Complicated by Group B streptococcal bacteremia. Large shoulder effusion with multiple small abscesses and pyomyositis along with an abscess in the biceps muscle. Status post I and D 08/17/2013 and continued on IV ceftriaxone and Augmentin. No current signs of infection upon exam.  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 No signs of active disease on exam.  Morbid obesity: Body habitus with major limitations on further diagnostic workup regarding sources of infection. Albumin of 1.8.  Weight is 460 pounds at admission. -Consider option of bariatric surgery as an outpatient.  Diet: renal/carb mod; dietician to see for education  Prophylaxis: heparin sq (he is refusing injection) and SCDs Code: Full  Dispo: Disposition is deferred at this time, awaiting improvement of current medical problems. Anticipated discharge in approximately 2 day(s).   The patient does have a current PCP (No primary care provider on file.) ([place]) and does need an [facility] hospital follow-up appointment after discharge.  The patient does have transportation limitations that hinder transportation to clinic appointments.  .Services Needed at time of discharge: Y = Yes, Blank = No PT:   OT:   RN:   Equipment:   Other:     LOS: 3 days   Myrtis Hopping  [place], DO 10/06/2014, 11:51 AM

## 2014-10-06 NOTE — Progress Notes (Signed)
Subjective: Interval History: has complaints wants to know more about diet and needs to exercise.  Objective: Vital signs in last 24 hours: Temp:  [97.5 F (36.4 C)-98.5 F (36.9 C)] 97.5 F (36.4 C) (12/20 0500) Pulse Rate:  [61-101] 94 (12/20 0500) Resp:  [18-21] 18 (12/20 0500) BP: (144-161)/(66-81) 146/66 mmHg (12/20 0500) SpO2:  [94 %-100 %] 94 % (12/20 0500) Weight:  [207.702 kg (457 lb 14.4 oz)] 207.702 kg (457 lb 14.4 oz) (12/19 2024) Weight change: -0.953 kg (-2 lb 1.6 oz)  Intake/Output from previous day: 12/19 0701 - 12/20 0700 In: 360 [P.O.:360] Out: 2325 [Urine:2325] Intake/Output this shift: Total I/O In: -  Out: 500 [Urine:500]  General appearance: alert, cooperative and morbidly obese Resp: diminished breath sounds bilaterally Cardio: S1, S2 normal and systolic murmur: holosystolic 2/6, blowing at apex GI: massive,pos bs, induration, hyperpig lat on L Extremities: edema 2+ and LTMA,  2 toes off on R  Lab Results:  Recent Labs  10/03/14 1402 10/05/14 0420  WBC 17.9* 15.6*  HGB 9.4* 9.6*  HCT 29.5* 30.4*  PLT 358 327   BMET:  Recent Labs  10/03/14 1402 10/05/14 0420  NA 136* 136*  K 4.7 4.7  CL 106 106  CO2 17* 16*  GLUCOSE 304* 292*  BUN 37* 39*  CREATININE 3.83* 3.97*  CALCIUM 6.4* 6.7*   No results for input(s): PTH in the last 72 hours. Iron Studies:  Recent Labs  10/04/14 1550  IRON 29*  TIBC 146*  FERRITIN 251    Studies/Results: Ct Abdomen Pelvis Wo Contrast  10/05/2014   CLINICAL DATA:  Bilateral flank pain.  High creatinine.  EXAM: CT ABDOMEN AND PELVIS WITHOUT CONTRAST  TECHNIQUE: Multidetector CT imaging of the abdomen and pelvis was performed following the standard protocol without IV contrast.  COMPARISON:  CT abdomen 07/24/2013  FINDINGS: Exam is suboptimal due to body habitus lack of IV contrast and patient motion.  Lower chest:  Lung bases are clear.  No pericardial fluid.  Hepatobiliary: Non IV contrast images  demonstrate no focal hepatic lesion.  Pancreas: Pancreas is normal. No ductal dilatation. No pancreatic inflammation.  Spleen: Normal spleen.  Adrenals/urinary tract: No nephrolithiasis or ureterolithiasis. No obstructive uropathy.  Stomach/Bowel: Stomach, small bowel, and colon are normal.  Vascular/Lymphatic: Abdominal aorta is normal caliber. There is no retroperitoneal or periportal lymphadenopathy. No pelvic lymphadenopathy. There is atherosclerotic calcification of the SMA  Reproductive: Prostate gland is normal.  Musculoskeletal: No aggressive osseous lesion.  Other: No hernia.  No free fluid or abscess  IMPRESSION: 1. Suboptimal exam. 2. No nephrolithiasis, ureterolithiasis or obstructive uropathy identified. 3. No acute findings in the abdomen or pelvis.   Electronically Signed   By: Suzy Bouchard M.D.   On: 10/05/2014 19:50   US Renal Port  10/05/2014   CLINICAL DATA:  Acute renal failure.  EXAM: RENAL/URINARY TRACT ULTRASOUND COMPLETE  COMPARISON:  CT 07/24/2013  FINDINGS: Limited due to body habitus.  Right Kidney:  Length: 14.0 cm.  Poorly visualized.  No hydronephrosis.  Left Kidney:  Not able to be adequately visualized.  Bladder:  Appears normal for degree of bladder distention.  IMPRESSION: Limited exam due to body habitus.  The right kidney is unremarkable without hydronephrosis.  The left kidney is not able to be visualized.   Electronically Signed   By: Lovey Newcomer M.D.   On: 10/05/2014 12:17    I have reviewed the patient's current medications.  Assessment/Plan: 1  CKD 4 doubt reversibility unless  some component aTN from NS.  Normal sized and no obstruction on CT/U/s.  Needs diet/BP control, control of acid/base, BS control.  Tx of anemia, 2 Morbid obesity ?? eval for Gastric bypass 3 PVD 4 DM needs tight control 5 Anemia needs epo , will need a dose IV FE 6 Probable HPTH Rec  ^ Na bicarb to 1300mg  tid, diet education, BS control, f/u PTH, Give Feraheme 510mg  iv x 1   LOS: 3  days   Brailon Don L 10/06/2014,10:25 AM

## 2014-10-06 NOTE — Progress Notes (Signed)
Patient refused labs this AM. Stated that he is still waiting on them to approve for him to get a picc line. Will inform MD and Day shift RN. Will continue to monitor and assess the patient throughout the rest of the shift.   [address] RN-BC, [place] [address] Phone [phone]

## 2014-10-07 LAB — CBC WITH DIFFERENTIAL/PLATELET
Basophils Absolute: 0 10*3/uL (ref 0.0–0.1)
Basophils Relative: 0 % (ref 0–1)
EOS ABS: 0.6 10*3/uL (ref 0.0–0.7)
Eosinophils Relative: 4 % (ref 0–5)
HCT: 29.8 % — ABNORMAL LOW (ref 39.0–52.0)
HEMOGLOBIN: 9.4 g/dL — AB (ref 13.0–17.0)
LYMPHS ABS: 1.7 10*3/uL (ref 0.7–4.0)
LYMPHS PCT: 11 % — AB (ref 12–46)
MCH: 27.9 pg (ref 26.0–34.0)
MCHC: 31.5 g/dL (ref 30.0–36.0)
MCV: 88.4 fL (ref 78.0–100.0)
MONOS PCT: 5 % (ref 3–12)
Monocytes Absolute: 0.8 10*3/uL (ref 0.1–1.0)
NEUTROS PCT: 80 % — AB (ref 43–77)
Neutro Abs: 12.4 10*3/uL — ABNORMAL HIGH (ref 1.7–7.7)
Platelets: 357 10*3/uL (ref 150–400)
RBC: 3.37 MIL/uL — AB (ref 4.22–5.81)
RDW: 13.1 % (ref 11.5–15.5)
WBC: 15.4 10*3/uL — ABNORMAL HIGH (ref 4.0–10.5)

## 2014-10-07 LAB — BASIC METABOLIC PANEL
Anion gap: 13 (ref 5–15)
BUN: 38 mg/dL — ABNORMAL HIGH (ref 6–23)
CHLORIDE: 107 meq/L (ref 96–112)
CO2: 19 mEq/L (ref 19–32)
Calcium: 7 mg/dL — ABNORMAL LOW (ref 8.4–10.5)
Creatinine, Ser: 3.96 mg/dL — ABNORMAL HIGH (ref 0.50–1.35)
GFR calc Af Amer: 21 mL/min — ABNORMAL LOW (ref >90)
GFR calc non Af Amer: 18 mL/min — ABNORMAL LOW (ref >90)
GLUCOSE: 203 mg/dL — AB (ref 70–99)
POTASSIUM: 4.7 meq/L (ref 3.7–5.3)
Sodium: 139 mEq/L (ref 137–147)

## 2014-10-07 LAB — GLUCOSE, CAPILLARY
GLUCOSE-CAPILLARY: 146 mg/dL — AB (ref 70–99)
GLUCOSE-CAPILLARY: 161 mg/dL — AB (ref 70–99)
Glucose-Capillary: 201 mg/dL — ABNORMAL HIGH (ref 70–99)

## 2014-10-07 LAB — PTH, INTACT AND CALCIUM
Calcium, Total (PTH): 6.6 mg/dL — ABNORMAL LOW (ref 8.4–10.5)
PTH: 257 pg/mL — ABNORMAL HIGH (ref 14–64)

## 2014-10-07 LAB — PHOSPHORUS: Phosphorus: 5.2 mg/dL — ABNORMAL HIGH (ref 2.3–4.6)

## 2014-10-07 MED ORDER — AZITHROMYCIN 500 MG PO TABS: 500.0000 mg | ORAL_TABLET | Freq: Every day | ORAL | Status: DC

## 2014-10-07 MED ORDER — INSULIN ASPART 100 UNIT/ML FLEXPEN: 5.0000 [IU] | PEN_INJECTOR | Freq: Three times a day (TID) | SUBCUTANEOUS | Status: DC

## 2014-10-07 MED ORDER — SODIUM BICARBONATE 650 MG PO TABS: 1300.0000 mg | ORAL_TABLET | Freq: Three times a day (TID) | ORAL | Status: DC

## 2014-10-07 MED ORDER — FLUTICASONE PROPIONATE 50 MCG/ACT NA SUSP: 1.0000 | Freq: Every day | NASAL | Status: AC

## 2014-10-07 MED ORDER — INSULIN GLARGINE 100 UNITS/ML SOLOSTAR PEN: 24.0000 [IU] | PEN_INJECTOR | Freq: Every day | SUBCUTANEOUS | Status: DC

## 2014-10-07 MED ORDER — AMLODIPINE BESYLATE 10 MG PO TABS: 10.0000 mg | ORAL_TABLET | Freq: Every day | ORAL | Status: DC

## 2014-10-07 MED ORDER — HYDRALAZINE HCL 10 MG PO TABS: 10.0000 mg | ORAL_TABLET | Freq: Three times a day (TID) | ORAL | Status: DC

## 2014-10-07 MED ORDER — INSULIN STARTER KIT- PEN NEEDLES (ENGLISH)
1.0000 | Freq: Once | Status: AC
  Administered 2014-10-07: 1
  Filled 2014-10-07: qty 1

## 2014-10-07 NOTE — Progress Notes (Signed)
Spoke with mother this morning.  Patient will not give himself insulin due to fear of needles.  Mother will be administering insulin.  Mother stated that she take insulin at home via pen and knows how to give insulin via pen to son.  Spoke with mother on the phone and discussed insulin starter kit.   

## 2014-10-07 NOTE — Progress Notes (Signed)
Inpatient Diabetes Program Recommendations  AACE/ADA: New Consensus Statement on Inpatient Glycemic Control (2013)  Target Ranges:  Prepandial:   less than 140 mg/dL      Peak postprandial:   less than 180 mg/dL (1-2 hours)      Critically ill patients:  140 - 180 mg/dL   Reason for Assessment:  Results for CLEMENT, DENEAULT (MRN [id number]) as of 10/07/2014 14:18  Ref. Range 10/06/2014 11:41 10/06/2014 16:47 10/06/2014 21:49 10/07/2014 07:45 10/07/2014 11:45  Glucose-Capillary Latest Range: 70-99 mg/dL 198 (H) 237 (H) 236 (H) 146 (H) 161 (H)    Diabetes history: Type 2 since age 55 Outpatient Diabetes medications: Metformin 500 mg daily Current orders for Inpatient glycemic control:  Lantus 24 units, Novolog 5 units tid with meals.    Note that patient will be discharged home today.  Discussed insulin pen use.  He states that he has been on insulin in the past and that his mother administers b/c the only place that he can take injections is in the backs of his arms.  His mother is also on insulin and uses an insulin pen.  When I inquired about why he can't take injections in his arms, he states that he has swelling in his stomach and he does not want to chance it.   I asked if his mother would always be with him and he states "most of the time".  He will also have a Home health RN.  Encouraged him to monitor and write down his blood sugars 3-4 times daily.  Explained that this would allow patient to take glucose diary to MD so that medications can be adjusted.  He states that in the past when he was on insulin, he had some lows.  Discussed symptoms of low blood glucose and proper treatment including 1/2 cup of juice or soda, glucose tablets, etc.  He states that when he was on insulin in the past, he had some lows and was familiar with the symptoms and treatment. Reviewed with patient again.  Ordered insulin pen starter kit for patient also.  No further recs. At this time.  Thanks, Adah Perl, RN, BC-ADM Inpatient Diabetes Coordinator Pager [phone]

## 2014-10-07 NOTE — Progress Notes (Signed)
Subjective:  Patient was agreeable to lab draws this morning. Patient states that his appetite, bowel, bladder are normal. Does not report any repeated episodes of diarrhea. Patient states that he is feeling well and is asking whether he is ready to be discharged.  Objective: Vital signs in last 24 hours: Filed Vitals:   10/06/14 1000 10/06/14 1855 10/06/14 2100 10/07/14 0500  BP: 150/67 144/62 140/74 146/74  Pulse: 89 99 97 95  Temp: 98 F (36.7 C) 98.4 F (36.9 C) 98 F (36.7 C) 97.7 F (36.5 C)  TempSrc: Oral Oral Oral Oral  Resp: 17 18 18 18   Height:      Weight:   455 lb 4 oz (206.5 kg)   SpO2: 95% 100% 100% 98%   Weight change: -2 lb 10.4 oz (-1.202 kg)  Intake/Output Summary (Last 24 hours) at 10/07/14 0836 Last data filed at 10/07/14 0500  Gross per 24 hour  Intake    600 ml  Output   2300 ml  Net  -1700 ml   General: resting in bed, extremely obese; body habitus limits exam Cardiac: RRR, no rubs, murmurs or gallops Pulm: clear to auscultation bilaterally, moving normal volumes of air Abd: soft, nontender, obese, area of induration on left side of abdomen Ext: warm and well perfused, no pedal edema Neuro: alert and oriented X3, responding appropriately, able to move voluntarily but movement limited by body habitus  Lab Results: Basic Metabolic Panel:  Recent Labs Lab 10/05/14 0420 10/07/14 0445  NA 136* 139  K 4.7 4.7  CL 106 107  CO2 16* 19  GLUCOSE 292* 203*  BUN 39* 38*  CREATININE 3.97* 3.96*  CALCIUM 6.7* 7.0*   Liver Function Tests:  Recent Labs Lab 10/01/14 1557 10/03/14 1402  AST 8 8  ALT <8 7  ALKPHOS 101 110  BILITOT 0.3 <0.2*  PROT 6.5 7.4  ALBUMIN 2.4* 1.8*   CBC:  Recent Labs Lab 10/05/14 0420 10/07/14 0445  WBC 15.6* 15.4*  NEUTROABS 12.8* 12.4*  HGB 9.6* 9.4*  HCT 30.4* 29.8*  MCV 90.5 88.4  PLT 327 357   CBG:  Recent Labs Lab 10/05/14 2021 10/06/14 0817 10/06/14 1141 10/06/14 1647 10/06/14 2149  10/07/14 0745  GLUCAP 263* 156* 198* 237* 236* 146*   Hemoglobin A1C:  Recent Labs Lab 10/01/14 1606  HGBA1C 10.4   Fasting Lipid Panel:  Recent Labs Lab 10/01/14 1557  CHOL 147  HDL 35*  LDLCALC 97  TRIG 76  CHOLHDL 4.2   Anemia Panel:  Recent Labs Lab 10/04/14 1550  FERRITIN 251  TIBC 146*  IRON 29*  RETICCTPCT 1.5   Micro Results: Recent Results (from the past 240 hour(s))  Blood culture (routine x 2)     Status: None (Preliminary result)   Collection Time: 10/03/14  2:00 PM  Result Value Ref Range Status   Specimen Description BLOOD RIGHT ANTECUBITAL  Final   Special Requests BOTTLES DRAWN AEROBIC AND ANAEROBIC 10CC  Final   Culture  Setup Time   Final    10/03/2014 18:21 Performed at [facility]    Culture   Final           BLOOD CULTURE RECEIVED NO GROWTH TO DATE CULTURE WILL BE HELD FOR 5 DAYS BEFORE ISSUING A FINAL NEGATIVE REPORT Performed at [facility]    Report Status PENDING  Incomplete  Blood culture (routine x 2)     Status: None (Preliminary result)   Collection Time: 10/03/14  2:15 PM  Result Value Ref Range Status   Specimen Description BLOOD RIGHT HAND  Final   Special Requests BOTTLES DRAWN AEROBIC AND ANAEROBIC 5CC  Final   Culture  Setup Time   Final    10/03/2014 18:21 Performed at [facility]    Culture   Final           BLOOD CULTURE RECEIVED NO GROWTH TO DATE CULTURE WILL BE HELD FOR 5 DAYS BEFORE ISSUING A FINAL NEGATIVE REPORT Performed at [facility]    Report Status PENDING  Incomplete  Clostridium Difficile by PCR     Status: None   Collection Time: 10/04/14 10:00 AM  Result Value Ref Range Status   C difficile by pcr NEGATIVE NEGATIVE Final   Studies/Results: Ct Abdomen Pelvis Wo Contrast  10/05/2014   CLINICAL DATA:  Bilateral flank pain.  High creatinine.  EXAM: CT ABDOMEN AND PELVIS WITHOUT CONTRAST  TECHNIQUE: Multidetector CT imaging of the abdomen and pelvis was performed  following the standard protocol without IV contrast.  COMPARISON:  CT abdomen 07/24/2013  FINDINGS: Exam is suboptimal due to body habitus lack of IV contrast and patient motion.  Lower chest:  Lung bases are clear.  No pericardial fluid.  Hepatobiliary: Non IV contrast images demonstrate no focal hepatic lesion.  Pancreas: Pancreas is normal. No ductal dilatation. No pancreatic inflammation.  Spleen: Normal spleen.  Adrenals/urinary tract: No nephrolithiasis or ureterolithiasis. No obstructive uropathy.  Stomach/Bowel: Stomach, small bowel, and colon are normal.  Vascular/Lymphatic: Abdominal aorta is normal caliber. There is no retroperitoneal or periportal lymphadenopathy. No pelvic lymphadenopathy. There is atherosclerotic calcification of the SMA  Reproductive: Prostate gland is normal.  Musculoskeletal: No aggressive osseous lesion.  Other: No hernia.  No free fluid or abscess  IMPRESSION: 1. Suboptimal exam. 2. No nephrolithiasis, ureterolithiasis or obstructive uropathy identified. 3. No acute findings in the abdomen or pelvis.   Electronically Signed   By: Suzy Bouchard M.D.   On: 10/05/2014 19:50   US Renal Port  10/05/2014   CLINICAL DATA:  Acute renal failure.  EXAM: RENAL/URINARY TRACT ULTRASOUND COMPLETE  COMPARISON:  CT 07/24/2013  FINDINGS: Limited due to body habitus.  Right Kidney:  Length: 14.0 cm.  Poorly visualized.  No hydronephrosis.  Left Kidney:  Not able to be adequately visualized.  Bladder:  Appears normal for degree of bladder distention.  IMPRESSION: Limited exam due to body habitus.  The right kidney is unremarkable without hydronephrosis.  The left kidney is not able to be visualized.   Electronically Signed   By: Lovey Newcomer M.D.   On: 10/05/2014 12:17   Medications: I have reviewed the patient's current medications. Scheduled Meds: . amLODipine  10 mg Oral Daily  . azithromycin  500 mg Oral Daily  . heparin  5,000 Units Subcutaneous 3 times per day  . insulin aspart  5  Units Subcutaneous TID WC  . insulin glargine  24 Units Subcutaneous Q2200  . sodium bicarbonate  1,300 mg Oral TID   Continuous Infusions:  PRN Meds:.senna-docusate   Assessment/Plan: Patient is a 38 year old male with a history of hypertension, type 2 diabetes, group B strep bacteremia, chronic kidney disease, osteomyelitis of the right foot, objective sleep apnea, anemia of chronic illness, and chronic pain who presents to the hospital as a direct admission leukocytosis presumed to be secondary to community-acquired pneumonia along with newly diagnosed stage IV chronic kidney disease.  Leukocytosis likely secondary to community acquired pneumonia: White count slightly  trended down to 15.4 from 15.6 yesterday, admission of 17.9. Patient remains afebrile. Patient's diarrhea has resolved and C. difficile was negative. Reports that cough and sputum production are improved since mission. -Continue azithromycin (start date 10/04/2014) -Patient with a rash to ceftriaxone -GI pathogen panel still pending  Acute on chronic kidney disease: Creatinine has been stable at 3.96 since admission, up from 1.041 a year ago. It is likely that this this represents a chronic process given his stability here in the hospital. Patient has a history of poorly controlled hypertension and diabetes. CT abdomen pelvis with no evidence of stones or obstruction. -Appreciate nephrology recommendations -Educated patient on limiting potassium, sodium, phosphorus, and carbohydrates in diet  Type 2 diabetes: Diabetes since age 59. A1c 10.4.  He has been on insulin in the past but oral medications recently.  He was not taking oral meds for several months prior to admission.  CBGs 156-198 today.  Patient has had a needle phobia and has been managed with insulin pens in the hospital. Blood glucoses have remained from 146-236 over the last 24 hours. -Continue Lantus Solostar from 24 units and continue with NovoLog flex pen 5 units 3  times a day with meals  Hypertension: Patient noncompliant with amlodipine 5 mg daily and hydralazine 25 mg 3 times a day at home. Patient states that he does not have problems with hypertension at home and only has elevated blood pressures when in contact with healthcare. Patient has been in the 628B to 151V systolic. -Continue with amlodipine 10 mg daily (up from home dose of 5 mg) -Consider adding back hydralazine as patient has remained slightly hypertensive in the 616W systolic.  Non-anion gap metabolic acidosis: Patient now status post sodium bicarbonate supplementation, acidosis is resolved. -Continue sodium bicarbonate 1300 mg by mouth 3 times a day  Anemia of chronic disease:  Iron studies consistent with anemia of chronic disease. Patient now status post 1 dose of IV Feraheme on 10/06/2014. -Appreciate nephrology recommendations -Consider erythropoietin as needed -Continue to follow blood counts as an outpatient  History of septic arthritis of left shoulder: Complicated by Group B streptococcal bacteremia. Large shoulder effusion with multiple small abscesses and pyomyositis along with an abscess in the biceps muscle. Status post I and D 08/17/2013 and continued on IV ceftriaxone and Augmentin. No current signs of infection upon exam.  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 No signs of active disease on exam.  Morbid obesity: Body habitus with major limitations on further diagnostic workup regarding sources of infection. Albumin of 1.8.  Weight is 460 pounds at admission. -Consider option of bariatric surgery as an outpatient.  Diet: renal/carb mod; dietician to see for education  Prophylaxis: heparin sq (he is refusing injection) and SCDs Code: Full  Dispo: Disposition is deferred at this time, awaiting improvement of current medical problems. Anticipated discharge in approximately 2 day(s).   The patient does  have a current PCP (No primary care provider on file.) ([place]) and does need an [facility] hospital follow-up appointment after discharge.  The patient does have transportation limitations that hinder transportation to clinic appointments.  .Services Needed at time of discharge: Y = Yes, Blank = No PT:   OT:   RN:   Equipment:   Other:     LOS: 4 days   Luan Moore, MD 10/07/2014, 8:36 AM

## 2014-10-07 NOTE — Plan of Care (Signed)
Problem: Food- and Nutrition-Related Knowledge Deficit (NB-1.1) Goal: Nutrition education Formal process to instruct or train a patient/client in a skill or to impart knowledge to help patients/clients voluntarily manage or modify food choices and eating behavior to maintain or improve health. Outcome: Completed/Met Date Met:  10/07/14 Nutrition Education Note  RD consulted for Renal Education. Provided "Healthy Eating with Kidney Disease" handout to pt. Reviewed food groups and provided written recommended serving sizes specifically determined for patient's current nutritional status.   Explained why diet restrictions are needed and provided lists of foods to limit/avoid that are high potassium, sodium, and phosphorus. Provided specific recommendations on safer alternatives of these foods. Strongly encouraged compliance of this diet.   Discussed importance of protein intake at each meal and snack. Discussed renal friendly drink options. RD contact information given. Teach back method used.  Expect good compliance.  Body mass index is 63.52 kg/(m^2). Pt meets criteria for morbid obesity based on current BMI.  Kallie Locks, MS, RD, LDN Pager # [phone] After hours/ weekend pager # [phone]

## 2014-10-07 NOTE — Progress Notes (Signed)
Physical Therapy Treatment Patient Details Name: Adrian Khan MRN: [id number] DOB: 11/27/1975 Today's Date: 10/07/2014    History of Present Illness 38 y/o male with PMH of HTN, morbid obesity, DM who p/w lab abnormalities from [facility] appointment. Pt was seen in [facility] yesterday and was establishing care. He was found to have a creatinine of 3.9 and leukocytosis of 17.1. Patient denies fevers, cough, vomiting. Pt did complain of diarrhea * 2 weeks. Pt is lactose intolerant but continues to eat dairy. Pt complains of abd pain- which he describes as spasms.     PT Comments    Patient performed exercise program today.  Declined sitting EOB due to air bed (painful when sitting on deflated bed).  Per PM&R note, patient not candidate for CIR.  Will need HHPT to continue therapy at discharge.  Follow Up Recommendations  Home health PT;Supervision/Assistance - 24 hour (Per PM&R note, patient not eligible for CIR.)     Equipment Recommendations  None recommended by PT    Recommendations for Other Services       Precautions / Restrictions Precautions Precautions: Fall Restrictions Weight Bearing Restrictions: No    Mobility  Bed Mobility Overal bed mobility: Needs Assistance             General bed mobility comments: Patient declined working with PT/OT on sitting EOB.  Patient able to scoot to [facility] using trendelenberg position of bed and pullin on headboard with RUE.  Assist to position hips for exercises.  Transfers                    Ambulation/Gait                 Stairs            Wheelchair Mobility    Modified Rankin (Stroke Patients Only)       Balance                                    Cognition Arousal/Alertness: Awake/alert Behavior During Therapy: WFL for tasks assessed/performed Overall Cognitive Status: Within Functional Limits for tasks assessed                      Exercises General Exercises - Upper  Extremity Shoulder Flexion: AROM;Right;10 reps;Supine;AAROM;Left General Exercises - Lower Extremity Ankle Circles/Pumps: AROM;Both;10 reps;Supine Quad Sets: AROM;Both;10 reps;Supine Short Arc Quad: AROM;Both;10 reps;Supine Heel Slides: AROM;Both;10 reps;Supine Hip ABduction/ADduction: AROM;Both;10 reps;Supine Straight Leg Raises: AROM;Both;10 reps;Supine    General Comments        Pertinent Vitals/Pain Pain Assessment: 0-10 Pain Score: 4  Pain Location: Back Pain Descriptors / Indicators: Constant;Aching Pain Intervention(s): Monitored during session;Repositioned    Home Living                      Prior Function            PT Goals (current goals can now be found in the care plan section) Progress towards PT goals: Progressing toward goals    Frequency  Min 3X/week    PT Plan Discharge plan needs to be updated    Co-evaluation             End of Session   Activity Tolerance: Patient limited by fatigue;Patient limited by pain Patient left: in bed;with call bell/phone within reach     Time: 1122-1149 PT Time Calculation (min) (ACUTE  ONLY): 27 min  Charges:  $Therapeutic Exercise: 23-37 mins                    G Codes:      Despina Pole 10/16/2014, 12:03 PM Carita Pian. Sanjuana Kava, [place] Pager [phone]

## 2014-10-07 NOTE — Discharge Summary (Signed)
Name: Adrian Khan MRN: [id number] DOB: [birth year] 38 y.o. PCP: No primary care provider on file.  Date of Admission: 10/03/2014  1:13 PM Date of Discharge: 10/07/2014 Attending Physician: Aldine Contes, MD  Discharge Diagnosis: Principal Problem:   Renal insufficiency Active Problems:   Type II diabetes mellitus with complication, uncontrolled   Leukocytosis   Morbid obesity, BMI unknown   HTN (hypertension)   AKI (acute kidney injury)  Discharge Medications:   Medication List    TAKE these medications        amLODipine 10 MG tablet  Commonly known as:  NORVASC  Take 1 tablet (10 mg total) by mouth daily.     azithromycin 500 MG tablet  Commonly known as:  ZITHROMAX  Take 1 tablet (500 mg total) by mouth daily.     fluticasone 50 MCG/ACT nasal spray  Commonly known as:  FLONASE  Place 1 spray into both nostrils daily.     glucose blood test strip  Commonly known as:  ONE TOUCH ULTRA TEST  Check blood sugar one time a day     insulin aspart 100 UNIT/ML FlexPen  Commonly known as:  NOVOLOG FLEXPEN  Inject 5 Units into the skin 3 (three) times daily with meals.     insulin glargine 100 unit/mL Sopn  Commonly known as:  LANTUS  Inject 0.24 mLs (24 Units total) into the skin daily at 10 pm.     metFORMIN 500 MG 24 hr tablet  Commonly known as:  GLUCOPHAGE XR  Take 1 tablet (500 mg total) by mouth daily with breakfast.     ONE TOUCH ULTRA MINI W/DEVICE Kit  Check blood sugar one time a day     ONETOUCH DELICA LANCETS FINE Misc  Check blood sugar one time a day     senna-docusate 8.6-50 MG per tablet  Commonly known as:  Senokot-S  Take 1 tablet by mouth 2 (two) times daily as needed for constipation.     silver sulfADIAZINE 1 % cream  Commonly known as:  SILVADENE  Apply 1 application topically daily.     sodium bicarbonate 650 MG tablet  Take 2 tablets (1,300 mg total) by mouth 3 (three) times daily.        Disposition and follow-up:     [name] was discharged from [facility] in Stable condition.  At the hospital follow up visit please address:  1.  Resolution of leukocytosis.  Completion of azithromycin (end date of 10/08/2014).  Response of anemia to Feraheme infusion as inpatient.  Compliance to a low sodium, potassium, phosphorous diets (elevated phosphorus noted on inpatient)  Diet for weight control and future consideration for bariatric surgery.   2.  Labs / imaging needed at time of follow-up: Follow-up basic metabolic panel and CBC  3.  Pending labs/ test needing follow-up: GI pathogen panel  Follow-up Appointments:     Follow-up Information    Follow up with Blain Pais, MD.   Specialty:  Internal Medicine   Why:  October 16, 2014 at 9:15 AM   Contact information:   [place] [id number] [phone]       Follow up with [facility] In 2 months.   Contact information:   [place]  [id number] [phone]       Discharge Instructions: Discharge Instructions    Call MD for:  difficulty breathing, headache or visual disturbances    Complete by:  As directed  Call MD for:  extreme fatigue    Complete by:  As directed      Call MD for:  hives    Complete by:  As directed      Call MD for:  persistant dizziness or light-headedness    Complete by:  As directed      Call MD for:  persistant nausea and vomiting    Complete by:  As directed      Call MD for:  redness, tenderness, or signs of infection (pain, swelling, redness, odor or green/yellow discharge around incision site)    Complete by:  As directed      Call MD for:  severe uncontrolled pain    Complete by:  As directed      Call MD for:  temperature >100.4    Complete by:  As directed      Diet - low sodium heart healthy    Complete by:  As directed      Increase activity slowly    Complete by:  As directed            Consultations: Treatment Team:  Placido Sou, MD  Procedures Performed:  Ct Abdomen Pelvis Wo Contrast  10/05/2014   CLINICAL DATA:  Bilateral flank pain.  High creatinine.  EXAM: CT ABDOMEN AND PELVIS WITHOUT CONTRAST  TECHNIQUE: Multidetector CT imaging of the abdomen and pelvis was performed following the standard protocol without IV contrast.  COMPARISON:  CT abdomen 07/24/2013  FINDINGS: Exam is suboptimal due to body habitus lack of IV contrast and patient motion.  Lower chest:  Lung bases are clear.  No pericardial fluid.  Hepatobiliary: Non IV contrast images demonstrate no focal hepatic lesion.  Pancreas: Pancreas is normal. No ductal dilatation. No pancreatic inflammation.  Spleen: Normal spleen.  Adrenals/urinary tract: No nephrolithiasis or ureterolithiasis. No obstructive uropathy.  Stomach/Bowel: Stomach, small bowel, and colon are normal.  Vascular/Lymphatic: Abdominal aorta is normal caliber. There is no retroperitoneal or periportal lymphadenopathy. No pelvic lymphadenopathy. There is atherosclerotic calcification of the SMA  Reproductive: Prostate gland is normal.  Musculoskeletal: No aggressive osseous lesion.  Other: No hernia.  No free fluid or abscess  IMPRESSION: 1. Suboptimal exam. 2. No nephrolithiasis, ureterolithiasis or obstructive uropathy identified. 3. No acute findings in the abdomen or pelvis.   Electronically Signed   By: Suzy Bouchard M.D.   On: 10/05/2014 19:50   Dg Chest 2 View  10/03/2014   CLINICAL DATA:  Leukocytosis.  EXAM: CHEST  2 VIEW  COMPARISON:  07/24/2013.  FINDINGS: Exam detail is significantly diminished secondary to patient's body habitus. The heart size and mediastinal contours are within normal limits. Both lungs are clear. The visualized skeletal structures are unremarkable.  IMPRESSION: Significantly diminished exam detail due to patient's body habitus.  No evidence for pneumonia.   Electronically Signed   By: Kerby Moors M.D.   On: 10/03/2014 22:42   US Renal  Port  10/05/2014   CLINICAL DATA:  Acute renal failure.  EXAM: RENAL/URINARY TRACT ULTRASOUND COMPLETE  COMPARISON:  CT 07/24/2013  FINDINGS: Limited due to body habitus.  Right Kidney:  Length: 14.0 cm.  Poorly visualized.  No hydronephrosis.  Left Kidney:  Not able to be adequately visualized.  Bladder:  Appears normal for degree of bladder distention.  IMPRESSION: Limited exam due to body habitus.  The right kidney is unremarkable without hydronephrosis.  The left kidney is not able to be visualized.   Electronically Signed  By: Lovey Newcomer M.D.   On: 10/05/2014 12:17   Admission HPI:   Patient is a 38 year old with a history of hypertension, morbid obesity, type 2 diabetes who presents with no major symptoms but with lab abnormalities including a leukocytosis and increased creatinine compared to one year prior. Patient was recently evaluated in clinic on 10/01/2014 to establish care with the internal medicine clinic. At that time, routine labs were drawn which were remarkable for elevated blood pressure, and increase in creatinine to 3.9 compared to prior of 1.04 on 09/20/2013, bicarbonate of 16 from a prior 21, a urine microalbumin of 1743.9, and a leukocytosis of 17.1. Patient was initially sent home from clinic and was called to come into the hospital for a direct admission for further workup. Patient overall reports no signs of infection. Patient denies any fevers, cough, skin lesions, purulent drainage from any skin lesions, nausea, or vomiting. Patient does report having some chronic diarrhea but denies any recent antibiotic use or residence in a healthcare facility recently.   Patient's major complaint has to do with muscle spasms that he feels throughout his abdomen. He states that this has been going on for around a year, almost as long as the episodes that he has had with abscesses and septic arthritis of his left shoulder. Patient also reporting some firmness in the left side of his abdomen  that has been going on in the same time, but denies any significant pain in the area. Patient denies any recurrent issues with pain or tenderness in his left shoulder or left arm. Otherwise, patient reports that he is very needle phobic and is initially refusing the use of subcutaneous insulin to control his diabetes.  Hospital Course by problem list: Principal Problem:   Renal insufficiency Active Problems:   Type II diabetes mellitus with complication, uncontrolled   Leukocytosis   Morbid obesity, BMI unknown   HTN (hypertension)   AKI (acute kidney injury)   Leukocytosis likely secondary to community acquired pneumonia: Patient was initially admitted with a white count of 17.9. Patient denied any skin lesions or rashes. No fevers or chills at home. Patient did report some diarrhea that have been persistent for several weeks, and worsened by eating dairy products stating that he was lactose intolerant. By the day after admission, patient stated that his diarrhea had resolved and a C. difficile PCR was negative. Patient did report a productive cough of clear sputum since admission. Chest x-ray was suboptimal given body habitus, though with no evidence of consolidation. No evidence of recurrent infection in the patient's left shoulder. Patient was started on a course of antibiotics for community-acquired pneumonia with a start date of 10/04/2014. Patient was initially started on ceftriaxone and azithromycin. Patient developed a rash after his first dose of ceftriaxone on his back and it was discontinued. Patient was planned for a continuation of his azithromycin for a total of 5 days with an end date of 10/08/2014.  Chronic kidney disease: Patient with a creatinine of 3.96 upon admission up from 1.04 from a year before presentation. It was unclear whether this represented an acute or chronic process but stable creatinine throughout his admission suggested that this was a chronic process. Nephrology was  consulted and favoring chronic kidney disease secondary to diabetes. CT abdomen pelvis with no evidence of stones or obstruction. Patient was educated on the role of limiting potassium, sodium, phosphorus, and carbohydrates in his diet. Patient is to follow-up with nephrology in 1-2 months as an outpatient.  Type 2 diabetes: Diabetes since age 39. A1c 10.4. Patient was noncompliant with his oral medications for several months prior to admission. He states that he does not have elevated blood sugars at home and only has issues with blood sugars once he is in contact with the healthcare system. 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 Patient was started on Lantus Solostar pens at 24 units daily at bedtime and NovoLog flex pen 5 units 3 times a day with meals.  Hypertension: Patient has been noncompliant with his home regimen of medications: Amlodipine 5 mg daily hydralazine 25 mg 3 times a day. Patient states that he does not have problems with hypertension at home. Patient had remained in the 585I to 778E systolic during his admission. Patient was initially started on amlodipine 5 mg daily and titrate up to 10 mg daily. Upon discharge, patient was started on 10 mg hydralazine 3 times a day for persistently elevated blood pressures in the 423N to 361W systolic.  Non-anion gap metabolic acidosis: Patient was started on sodium bicarbonate supplementation and his acidosis resolved. He was started on sodium bicarbonate 1300 mg 3 times a day.  Anemia of chronic disease:Patient was given 1 dose of IV Feraheme on 10/06/2014. Patient will continue be followed as an outpatient with nephrology with no indication for erythropoetin at this time.  History of septic arthritis of left shoulder: Complicated by Group B streptococcal bacteremia. Large shoulder effusion with multiple small abscesses and pyomyositis along with an  abscess in the biceps muscle. Status post I and D 08/17/2013 and continued on IV ceftriaxone and Augmentin. No current signs of infection upon exam.  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 No signs of active disease on exam.  Morbid obesity: Body habitus with major limitations on further diagnostic workup regarding sources of infection. Albumin of 1.8. Weight is 460 pounds at admission. Would consider patient's eligibility for bariatric surgery at some point in the future.   Discharge Vitals:   BP 152/104 mmHg  Pulse 96  Temp(Src) 98 F (36.7 C) (Oral)  Resp 16  Ht 5' 11"  (1.803 m)  Wt 455 lb 4 oz (206.5 kg)  BMI 63.52 kg/m2  SpO2 98%  Discharge Labs:  Results for orders placed or performed during the hospital encounter of 10/03/14 (from the past 24 hour(s))  Glucose, capillary     Status: Abnormal   Collection Time: 10/06/14  4:47 PM  Result Value Ref Range   Glucose-Capillary 237 (H) 70 - 99 mg/dL  Glucose, capillary     Status: Abnormal   Collection Time: 10/06/14  9:49 PM  Result Value Ref Range   Glucose-Capillary 236 (H) 70 - 99 mg/dL  CBC with Differential     Status: Abnormal   Collection Time: 10/07/14  4:45 AM  Result Value Ref Range   WBC 15.4 (H) 4.0 - 10.5 K/uL   RBC 3.37 (L) 4.22 - 5.81 MIL/uL   Hemoglobin 9.4 (L) 13.0 - 17.0 g/dL   HCT 29.8 (L) 39.0 - 52.0 %   MCV 88.4 78.0 - 100.0 fL   MCH 27.9 26.0 - 34.0 pg   MCHC 31.5 30.0 - 36.0 g/dL   RDW 13.1 11.5 - 15.5 %   Platelets 357 150 - 400 K/uL   Neutrophils Relative % 80 (H) 43 - 77 %   Neutro Abs 12.4 (H) 1.7 -  7.7 K/uL   Lymphocytes Relative 11 (L) 12 - 46 %   Lymphs Abs 1.7 0.7 - 4.0 K/uL   Monocytes Relative 5 3 - 12 %   Monocytes Absolute 0.8 0.1 - 1.0 K/uL   Eosinophils Relative 4 0 - 5 %   Eosinophils Absolute 0.6 0.0 - 0.7 K/uL   Basophils Relative 0 0 - 1 %   Basophils Absolute 0.0 0.0 - 0.1 K/uL  Basic metabolic panel      Status: Abnormal   Collection Time: 10/07/14  4:45 AM  Result Value Ref Range   Sodium 139 137 - 147 mEq/L   Potassium 4.7 3.7 - 5.3 mEq/L   Chloride 107 96 - 112 mEq/L   CO2 19 19 - 32 mEq/L   Glucose, Bld 203 (H) 70 - 99 mg/dL   BUN 38 (H) 6 - 23 mg/dL   Creatinine, Ser 3.96 (H) 0.50 - 1.35 mg/dL   Calcium 7.0 (L) 8.4 - 10.5 mg/dL   GFR calc non Af Amer 18 (L) >90 mL/min   GFR calc Af Amer 21 (L) >90 mL/min   Anion gap 13 5 - 15  Phosphorus     Status: Abnormal   Collection Time: 10/07/14  4:45 AM  Result Value Ref Range   Phosphorus 5.2 (H) 2.3 - 4.6 mg/dL  Glucose, capillary     Status: Abnormal   Collection Time: 10/07/14  7:45 AM  Result Value Ref Range   Glucose-Capillary 146 (H) 70 - 99 mg/dL  Glucose, capillary     Status: Abnormal   Collection Time: 10/07/14 11:45 AM  Result Value Ref Range   Glucose-Capillary 161 (H) 70 - 99 mg/dL    Signed: Luan Moore, MD 10/07/2014, 2:27 PM    Services Ordered on Discharge: home health PT Equipment Ordered on Discharge: none

## 2014-10-07 NOTE — Clinical Social Work Note (Signed)
Patient medically stable for discharge home today. Ambulance transport requested and called to take patient home.  Bassel Gaskill Givens, MSW, LCSW Licensed Clinical Social Worker [place] [phone]

## 2014-10-07 NOTE — Care Management Note (Signed)
CARE MANAGEMENT NOTE 10/07/2014  Patient:  EDIBERTO, SENS   Account Number:  [id number]  Date Initiated:  10/07/2014  Documentation initiated by:  Elexius Minar  Subjective/Objective Assessment:   CM following for progression and d/c planning.     Action/Plan:   Met with pt who states that he has [place] however is unable to recall the name of the agency, this CM unable to reach pt Mom, to obtain this information, will continue to try to reach his Mom and arrange HHPT services.   Anticipated DC Date:  10/07/2014   Anticipated DC Plan:  [place]         Choice offered to / List presented to:          [facility] arranged  [place].   Status of service:   Medicare Important Message given?  YES (If response is "NO", the following Medicare IM given date fields will be blank) Date Medicare IM given:  10/07/2014 Medicare IM given by:  Abdirizak Richison Date Additional Medicare IM given:   Additional Medicare IM given by:    Discharge Disposition:  [place]  Per UR Regulation:    If discussed at Long Length of Stay Meetings, dates discussed:    Comments:

## 2014-10-07 NOTE — Discharge Instructions (Signed)
Please follow up in our Internal Medicine Clinic on October 16, 2014 at 9:15 AM with Dr. Hayes Ludwig. Please have follow up with nephrology as well in 1-2 months.    End-Stage Kidney Disease The kidneys are two organs that lie on either side of the spine between the middle of the back and the front of the abdomen. The kidneys:   Remove wastes and extra water from the blood.   Produce important hormones. These help keep bones strong, regulate blood pressure, and help create red blood cells.   Balance the fluids and chemicals in the blood and tissues. End-stage kidney disease occurs when the kidneys are so damaged that they cannot do their job. When the kidneys cannot do their job, life-threatening problems occur. The body cannot stay clean and strong without the help of the kidneys. In end-stage kidney disease, the kidneys cannot get better.You need a new kidney or treatments to do some of the work healthy kidneys do in order to stay alive. CAUSES  End-stage kidney disease usually occurs when a long-lasting (chronic) kidney disease gets worse. It may also occur after the kidneys are suddenly damaged (acute kidney injury).  SYMPTOMS   Swelling (edema) of the legs, ankles, or feet.   Tiredness (lethargy).   Nausea or vomiting.   Confusion.   Problems with urination, such as:   Decreased urine production.   Frequent urination, especially at night.   Frequent accidents in children who are potty trained.   Muscle twitches and cramps.   Persistent itchiness.   Loss of appetite.   Headaches.   Abnormally dark or light skin.   Numbness in the hands or feet.   Easy bruising.   Frequent hiccups.   Menstruation stops. DIAGNOSIS  Your health care provider will measure your blood pressure and take some tests. These may include:   Urine tests.   Blood tests.   Imaging tests, such as:   An ultrasound exam.   Computed tomography (CT).  A kidney  biopsy. TREATMENT  There are two treatments for end-stage kidney disease:   A procedure that removes toxic wastes from the body (dialysis).   Receiving a new kidney (kidney transplant). Both of these treatments have serious risks and consequences. Your health care provider will help you determine which treatment is best for you based on your health, age, and other factors. In addition to having dialysis or a kidney transplant, you may need to take medicines to control high blood pressure (hypertension) and cholesterol and to decrease phosphorus levels in your blood.  HOME CARE INSTRUCTIONS  Follow your prescribed diet.   Take medicines only as directed by your health care provider.   Do not take any new medicines (prescription, over-the-counter, or nutritional supplements) unless approved by your health care provider. Many medicines can worsen your kidney damage or need to have the dose adjusted.   Keep all follow-up visits as directed by your health care provider. MAKE SURE YOU:  Understand these instructions.  Will watch your condition.  Will get help right away if you are not doing well or get worse. Document Released: 12/25/2003 Document Revised: 02/18/2014 Document Reviewed: 06/02/2012 [facility] Patient Information 2015 [employer], [place]. This information is not intended to replace advice given to you by your health care provider. Make sure you discuss any questions you have with your health care provider.

## 2014-10-07 NOTE — Progress Notes (Signed)
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   Subjective: Interval History: No new issues  Objective: Vital signs in last 24 hours: Temp:  [97.7 F (36.5 Khan)-98.4 F (36.9 Khan)] 98 F (36.7 Khan) (12/21 0900) Pulse Rate:  [95-99] 96 (12/21 0900) Resp:  [16-18] 16 (12/21 0900) BP: (140-152)/(62-104) 152/104 mmHg (12/21 0900) SpO2:  [98 %-100 %] 98 % (12/21 0900) Weight:  [206.5 kg (455 lb 4 oz)] 206.5 kg (455 lb 4 oz) (12/20 2100) Weight change: -1.202 kg (-2 lb 10.4 oz)  Intake/Output from previous day: 12/20 0701 - 12/21 0700 In: 600 [P.O.:600] Out: 2300 [Urine:2300] Intake/Output this shift: Total I/O In: 240 [P.O.:240] Out: 550 [Urine:550]  General appearance: alert and cooperative  Morbidly obese male 1-2+ edema  Lab Results:  Recent Labs  10/05/14 0420 10/07/14 0445  WBC 15.6* 15.4*  HGB 9.6* 9.4*  HCT 30.4* 29.8*  PLT 327 357   BMET:  Recent Labs  10/05/14 0420 10/07/14 0445  NA 136* 139  K 4.7 4.7  CL 106 107  CO2 16* 19  GLUCOSE 292* 203*  BUN 39* 38*  CREATININE 3.97* 3.96*  CALCIUM 6.7* 7.0*   No results for input(s): PTH in the last 72 hours. Iron Studies:  Recent Labs  10/04/14 1550  IRON 29*  TIBC 146*  FERRITIN 251   Studies/Results: Ct Abdomen Pelvis Wo Contrast  10/05/2014   CLINICAL DATA:  Bilateral flank pain.  High creatinine.  EXAM: CT ABDOMEN AND PELVIS WITHOUT CONTRAST  TECHNIQUE: Multidetector CT imaging of the abdomen and pelvis was performed following the standard protocol without IV contrast.  COMPARISON:  CT abdomen 07/24/2013  FINDINGS: Exam is suboptimal due to body habitus lack of IV contrast and patient motion.  Lower chest:  Lung bases are clear.  No pericardial fluid.  Hepatobiliary: Non IV contrast images demonstrate no focal hepatic  lesion.  Pancreas: Pancreas is normal. No ductal dilatation. No pancreatic inflammation.  Spleen: Normal spleen.  Adrenals/urinary tract: No nephrolithiasis or ureterolithiasis. No obstructive uropathy.  Stomach/Bowel: Stomach, small bowel, and colon are normal.  Vascular/Lymphatic: Abdominal aorta is normal caliber. There is no retroperitoneal or periportal lymphadenopathy. No pelvic lymphadenopathy. There is atherosclerotic calcification of the SMA  Reproductive: Prostate gland is normal.  Musculoskeletal: No aggressive osseous lesion.  Other: No hernia.  No free fluid or abscess  IMPRESSION: 1. Suboptimal exam. 2. No nephrolithiasis, ureterolithiasis or obstructive uropathy identified. 3. No acute findings in the abdomen or pelvis.   Electronically Signed   By: Suzy Bouchard M.D.   On: 10/05/2014 19:50   US Renal Port  10/05/2014   CLINICAL DATA:  Acute renal failure.  EXAM: RENAL/URINARY TRACT ULTRASOUND COMPLETE  COMPARISON:  CT 07/24/2013  FINDINGS: Limited due to body habitus.  Right Kidney:  Length: 14.0 cm.  Poorly visualized.  No hydronephrosis.  Left Kidney:  Not able to be adequately visualized.  Bladder:  Appears normal for degree of bladder distention.  IMPRESSION: Limited exam due to body habitus.  The right kidney is unremarkable without hydronephrosis.  The left kidney is not able to be visualized.   Electronically Signed   By: Lovey Newcomer M.D.   On: 10/05/2014 12:17   Scheduled: . amLODipine  10 mg  Oral Daily  . azithromycin  500 mg Oral Daily  . heparin  5,000 Units Subcutaneous 3 times per day  . insulin aspart  5 Units Subcutaneous TID WC  . insulin glargine  24 Units Subcutaneous Q2200  . sodium bicarbonate  1,300 mg Oral TID    LOS: 4 days   Adrian Khan 10/07/2014,10:27 AM

## 2014-10-07 NOTE — Progress Notes (Signed)
OT Cancellation Note  Patient Details Name: Adrian Khan MRN: [id number] DOB: 08/17/1976   Cancelled Treatment:    Reason Eval/Treat Not Completed: Other (comment). Attempted to co-treat with PT but pt refusing to sit EOB due to mattress (PT stayed in session for exercises). OT explained purpose/benefit of air mattress. Pt discussing d/c plans.   Benito Mccreedy OTR/L [phone] 10/07/2014, 11:41 AM

## 2014-10-07 NOTE — Progress Notes (Signed)
Discharge instructions and medications discussed with patient.  Prescription, diabetic kit, Novolog, & Lantus insulin pen given to patient.  All questions answered.

## 2014-10-08 LAB — GI PATHOGEN PANEL BY PCR, STOOL
C difficile toxin A/B: NEGATIVE
CRYPTOSPORIDIUM BY PCR: NEGATIVE
Campylobacter by PCR: NEGATIVE
E COLI (ETEC) LT/ST: NEGATIVE
E COLI (STEC): NEGATIVE
E COLI 0157 BY PCR: NEGATIVE
G lamblia by PCR: NEGATIVE
NOROVIRUS G1/G2: NEGATIVE
Rotavirus A by PCR: NEGATIVE
SALMONELLA BY PCR: NEGATIVE
SHIGELLA BY PCR: NEGATIVE

## 2014-10-08 LAB — PROTEIN ELECTROPHORESIS, SERUM
ALPHA-1-GLOBULIN: 5.4 % — AB (ref 2.9–4.9)
Albumin ELP: 31.9 % — ABNORMAL LOW (ref 55.8–66.1)
Alpha-2-Globulin: 24.6 % — ABNORMAL HIGH (ref 7.1–11.8)
BETA 2: 8.3 % — AB (ref 3.2–6.5)
Beta Globulin: 4.9 % (ref 4.7–7.2)
Gamma Globulin: 24.9 % — ABNORMAL HIGH (ref 11.1–18.8)
M-SPIKE, %: NOT DETECTED g/dL
Total Protein ELP: 6.1 g/dL (ref 6.0–8.3)

## 2014-10-09 LAB — CULTURE, BLOOD (ROUTINE X 2)
CULTURE: NO GROWTH
Culture: NO GROWTH

## 2014-10-16 ENCOUNTER — Encounter: Payer: Self-pay | Admitting: Dietician

## 2014-10-16 ENCOUNTER — Encounter: Payer: Self-pay | Admitting: Internal Medicine

## 2014-10-16 ENCOUNTER — Ambulatory Visit (INDEPENDENT_AMBULATORY_CARE_PROVIDER_SITE_OTHER): Payer: PRIVATE HEALTH INSURANCE | Admitting: Dietician

## 2014-10-16 ENCOUNTER — Other Ambulatory Visit: Payer: Self-pay | Admitting: Oncology

## 2014-10-16 ENCOUNTER — Encounter: Payer: Self-pay | Admitting: Licensed Clinical Social Worker

## 2014-10-16 ENCOUNTER — Telehealth: Payer: Self-pay | Admitting: Licensed Clinical Social Worker

## 2014-10-16 ENCOUNTER — Ambulatory Visit (INDEPENDENT_AMBULATORY_CARE_PROVIDER_SITE_OTHER): Payer: PRIVATE HEALTH INSURANCE | Admitting: Internal Medicine

## 2014-10-16 VITALS — BP 156/76 | HR 95 | Temp 97.7°F | Ht 71.0 in

## 2014-10-16 DIAGNOSIS — N179 Acute kidney failure, unspecified: Secondary | ICD-10-CM

## 2014-10-16 DIAGNOSIS — D72829 Elevated white blood cell count, unspecified: Secondary | ICD-10-CM

## 2014-10-16 DIAGNOSIS — IMO0002 Reserved for concepts with insufficient information to code with codable children: Secondary | ICD-10-CM

## 2014-10-16 DIAGNOSIS — E1165 Type 2 diabetes mellitus with hyperglycemia: Secondary | ICD-10-CM

## 2014-10-16 DIAGNOSIS — Z6841 Body Mass Index (BMI) 40.0 and over, adult: Secondary | ICD-10-CM

## 2014-10-16 DIAGNOSIS — R5381 Other malaise: Secondary | ICD-10-CM

## 2014-10-16 DIAGNOSIS — E118 Type 2 diabetes mellitus with unspecified complications: Principal | ICD-10-CM

## 2014-10-16 DIAGNOSIS — D638 Anemia in other chronic diseases classified elsewhere: Secondary | ICD-10-CM

## 2014-10-16 DIAGNOSIS — I10 Essential (primary) hypertension: Secondary | ICD-10-CM

## 2014-10-16 DIAGNOSIS — L304 Erythema intertrigo: Secondary | ICD-10-CM

## 2014-10-16 LAB — GLUCOSE, CAPILLARY: Glucose-Capillary: 206 mg/dL — ABNORMAL HIGH (ref 70–99)

## 2014-10-16 LAB — CBC WITH DIFFERENTIAL/PLATELET
BASOS ABS: 0 10*3/uL (ref 0.0–0.1)
Basophils Relative: 0 % (ref 0–1)
EOS PCT: 4 % (ref 0–5)
Eosinophils Absolute: 0.6 10*3/uL (ref 0.0–0.7)
HEMATOCRIT: 34.1 % — AB (ref 39.0–52.0)
HEMOGLOBIN: 11 g/dL — AB (ref 13.0–17.0)
LYMPHS ABS: 1.5 10*3/uL (ref 0.7–4.0)
Lymphocytes Relative: 10 % — ABNORMAL LOW (ref 12–46)
MCH: 29.2 pg (ref 26.0–34.0)
MCHC: 32.3 g/dL (ref 30.0–36.0)
MCV: 90.5 fL (ref 78.0–100.0)
MONOS PCT: 7 % (ref 3–12)
Monocytes Absolute: 1 10*3/uL (ref 0.1–1.0)
Neutro Abs: 11.3 10*3/uL — ABNORMAL HIGH (ref 1.7–7.7)
Neutrophils Relative %: 79 % — ABNORMAL HIGH (ref 43–77)
Platelets: 366 10*3/uL (ref 150–400)
RBC: 3.77 MIL/uL — ABNORMAL LOW (ref 4.22–5.81)
RDW: 13.2 % (ref 11.5–15.5)
WBC: 14.3 10*3/uL — ABNORMAL HIGH (ref 4.0–10.5)

## 2014-10-16 LAB — BASIC METABOLIC PANEL
Anion gap: 7 (ref 5–15)
BUN: 39 mg/dL — ABNORMAL HIGH (ref 6–23)
CALCIUM: 7.2 mg/dL — AB (ref 8.4–10.5)
CO2: 19 mmol/L (ref 19–32)
Chloride: 111 mEq/L (ref 96–112)
Creatinine, Ser: 4.24 mg/dL — ABNORMAL HIGH (ref 0.50–1.35)
GFR calc Af Amer: 19 mL/min — ABNORMAL LOW (ref >90)
GFR calc non Af Amer: 17 mL/min — ABNORMAL LOW (ref >90)
GLUCOSE: 228 mg/dL — AB (ref 70–99)
POTASSIUM: 4.9 mmol/L (ref 3.5–5.1)
Sodium: 137 mmol/L (ref 135–145)

## 2014-10-16 LAB — CK: CK TOTAL: 102 U/L (ref 7–232)

## 2014-10-16 LAB — PHOSPHORUS: PHOSPHORUS: 5.5 mg/dL — AB (ref 2.3–4.6)

## 2014-10-16 MED ORDER — NYSTATIN 100000 UNIT/GM EX POWD: CUTANEOUS | Status: AC

## 2014-10-16 NOTE — Patient Instructions (Addendum)
General Instructions: -Follow up with the Kidney doctor.  -Follow up with [place] next week.    Thank you for bringing your medicines today. This helps [place] keep you safe from mistakes.   Progress Toward Treatment Goals:  No flowsheet data found.  Self Care Goals & Plans:  Self Care Goal 10/16/2014  Manage my medications take my medicines as prescribed; bring my medications to every visit; refill my medications on time  Monitor my health keep track of my blood glucose; bring my glucose meter and log to each visit  Eat healthy foods drink diet soda or water instead of juice or soda; eat more vegetables; eat foods that are low in salt; eat baked foods instead of fried foods; eat fruit for snacks and desserts    No flowsheet data found.   Care Management & Community Referrals:  No flowsheet data found.

## 2014-10-16 NOTE — Progress Notes (Addendum)
CSW met with Mr. Garlitz and his mother during his scheduled [facility] appointment.  [name] is primary caregiver and is requesting SNF placement and assistance with applying for gov't cell phone.  Mother states he has spoken with [facility] RN care manager and was told they would cover SNF.  Pt is current with [facility] for home health services.  Mother has looked into [facility] and would like referral to be sent the them.  CSW discussed referral to SNF with Mr. Wicklund and discussed need to participate in PT/OT unless medically contraindicated.  In addition to SNF benefits, CSW discuss PCS benefits under [place] [employer].  Mother was unaware of PCS benefits.  Mother informed PCS and be requested upon d/c from SNF.   CSW placed call to [facility] and spoke with admissions, Santiago Glad.  Santiago Glad requested referral information to be faxed.  FL-2 completed, order for admit to SNF, eval and treat PT/OT/ST faxed. Awaiting response from [facility] regarding availability and acceptance. CSW initiated the application for the subsidized cell phone, however, they currently already have household cell phone from [employer].  Application was not completed.

## 2014-10-16 NOTE — Progress Notes (Signed)
Diabetes Self-Management Education  Visit Type:    Appt. Start Time: 1000 Appt. End Time: 3664  10/16/2014  Mr. Adrian Khan, identified by name and date of birth, is a 38 y.o. male with a diagnosis of Diabetes: Type 2.  Other people present during visit:  Parent   ASSESSMENT  Vitals in office visit note. There is no weight on file to calculate BMI.Last weight 455#, BMI ~ 63.   Initial Visit Information:  Are you currently following a meal plan?: No   Are you taking your medications as prescribed?: Yes Are you checking your feet?: No   How often do you need to have someone help you when you read instructions, pamphlets, or other written materials from your doctor or pharmacy?: 4 - Often    Psychosocial:   Patient Belief/Attitude about Diabetes: Afraid Self-care barriers: Debilitated state due to current medical condition, Lack of material resources, Other (comment) Self-management support: Family Other persons present: Parent Patient Concerns: Medication Special Needs: Simplified materials, Verbal instruction, Instruct caregiver Preferred Learning Style: Auditory Learning Readiness: Not Ready  Complications:   How often do you check your blood sugar?: 1-2 times/day Have you had a dilated eye exam in the past 12 months?: No Have you had a dental exam in the past 12 months?: No  Diet Intake:  note reviewed today, not appropriate.   Exercise:  Exercise:  (patient bedbound,unable to do ADLs, placement pending)  Individualized Plan for Diabetes Self-Management Training:   Learning Objective:  Patient will have a greater understanding of diabetes self-management. Patient education plan per assessed needs and concerns is to demonstrate how to give injections properly and state purpose and side affects of current diabetes medications     Education Topics Reviewed with Patient Today:    Taught/reviewed insulin injection, site rotation, insulin storage and needle  disposal., Reviewed patients medication for diabetes, action, purpose, timing of dose and side effects.     PATIENTS GOALS/Plan (Developed by the patient):  Medications: take my medication as prescribed  Plan:   Reassessment diabetes self management needs at next visit and ongoing  Expected Outcomes:  Demonstrated limited interest in learning.  Expect minimal changes Education material provided: BD insulin injections and site selection booklet and 4 mm pen needles If problems or questions, patient to contact team via:  Phone Future DSME appointment: 4-6 wks

## 2014-10-16 NOTE — Telephone Encounter (Signed)
CSW placed call to provided working phone number for [name], [phone].  Other number: [phone] will be turned on 10/17/14.  Pt and family notified [facility] has completed all required paperwork.  Facility has updated pt/family contact to discuss potential admission.  Will discuss PCS once determination of SNF is made.

## 2014-10-16 NOTE — Telephone Encounter (Signed)
Facility notified [facility] will be closed 12/31 & 1/1 and that pt has a [facility] RN Case Freight forwarder that informed them SNF would be approved.  Facility to contact mother to obtain name of [facility] RN Case Manager to help facilitate approval.

## 2014-10-17 DIAGNOSIS — L304 Erythema intertrigo: Secondary | ICD-10-CM | POA: Insufficient documentation

## 2014-10-17 NOTE — Progress Notes (Signed)
Medicine attending: Medical history, presenting problems, physical findings, and medications, reviewed with Dr Hayes Ludwig on the day of the patient encounter and I concur with her evaluation and management plan. I personally examined this patient. 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 He has had surgery on his left shoulder for a septic joint. He is currently back to his baseline but still elevated white count with a persistent shift to the left. No early myeloid cells reported on the blood film. He is currently afebrile. No obvious focal source for ongoing infection but I suspect that chronic inflammation secondary to intertrigo with Candida in the skin folds of his inguinal region may be a contributing factor to his chronic leukocytosis. We will treat topically for now but I have a low threshold for putting him on a trial of fluconazole.

## 2014-10-17 NOTE — Assessment & Plan Note (Signed)
Lab Results  Component Value Date   HGBA1C 10.4 10/01/2014   HGBA1C 13.7* 07/25/2013   HGBA1C 11.5* 09/22/2012     Assessment: Diabetes control:  not controlled Progress toward A1C goal:   not at goal Comments: Recently started on insulin pen. On Lantus 14 u qHS, Novolog 5 u TIC ac, Will likely need increase in his insulin while at SNF. 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 Butch Penny Plyler met with him and his mother for further education on insulin injection sites.   Plan: Medications:  continue current medications Home glucose monitoring: Frequency:   Timing:   Instruction/counseling given: reminded to bring blood glucose meter & log to each visit and reminded to bring medications to each visit Educational resources provided: brochure (has information) Self management tools provided: copy of home glucose meter download Other plans: Follow up in 1 week.

## 2014-10-17 NOTE — Assessment & Plan Note (Signed)
BP Readings from Last 3 Encounters:  10/16/14 156/76  10/07/14 151/71  10/01/14 178/112    Lab Results  Component Value Date   NA 137 10/16/2014   K 4.9 10/16/2014   CREATININE 4.24* 10/16/2014    Assessment: Blood pressure control:  not controlled Progress toward BP goal:   not at goal Comments: On hydralazine 10mg  TID, amlodipine 10mg  daily, Will need more closed monitoring of BP. Hydralazine may be increased if needed.   Plan: Medications:  continue current medications Educational resources provided: brochure Self management tools provided:   Other plans: Follow up in 1 week.

## 2014-10-17 NOTE — Assessment & Plan Note (Addendum)
Under his panus. Rx Nystatin powder per mother's preference

## 2014-10-17 NOTE — Assessment & Plan Note (Signed)
He is completely dependent of ADLs with multiple medical problems and would benefit from Rehab for strengthening.  Referred to Admission for SNF Appreciate Groveton Grady's assistance with placement for this pt--FL2 faxed to facility, they will contact pt if his request is approved and a bed is available.

## 2014-10-17 NOTE — Assessment & Plan Note (Signed)
Ordered admission to SNF, see above for further details.

## 2014-10-17 NOTE — Assessment & Plan Note (Signed)
Ordered STAT BMET which shows worsening of his Cr since his hospitalization with unremarkable BUN--he is tolerating PO intake well. His phosphorus is slightly elevated with non dietary compliance per his mother's report. His acidosis has improved with sodium bicarbonate.  -Referred to Nephrology--urgent request, spoke with [facility] office, they will send his case for review prior to scheduling his appointment that was requested for the next week.

## 2014-10-17 NOTE — Progress Notes (Signed)
   Subjective:    Patient ID: Adrian Khan, male    DOB: 11/20/1975, 38 y.o.   MRN: [id number]  HPI Adrian Khan is a 38 yr man with PMH significant for morbid obesity, decreased mobility, uncontrolled DM2, hx of osteomyelitis of right foot s/p ray amputation, HTN, CKD, who presents via stretcher/ambulance ride for HFU. He is accompanied by his mother who is his sole caregiver. He was hospitalized recently due to renal insufficiency with discharge on 10/07/14. He was seen by Nephrology during that hospitalization and was thought to have worsening CKD instead of acute renal insufficiency. He had not had medical follow up for over 1 year prior to his recent hospitalization and was just started on insulin.  He has a needle phobia and is still getting adjusted to the insulin pen injections.  He and his mother are very interested in placement into a rehab facility as he continues to be completely dependent of his ADLs, unable to ambulate. His mother complains of a rash on his right arm that started after insulin use and is now improving.    Review of Systems  Constitutional: Negative for fever, chills, diaphoresis, appetite change and fatigue.  Respiratory: Negative for cough.   Cardiovascular: Negative for chest pain.  Gastrointestinal: Negative for abdominal pain.  Genitourinary: Negative for dysuria and flank pain.  Skin: Positive for rash.  Neurological: Negative for dizziness and light-headedness.  Psychiatric/Behavioral: Negative for agitation.       Objective:   Physical Exam  Constitutional: He is oriented to person, place, and time. No distress.  On stretcher, morbidly obese  HENT:  Head: Normocephalic and atraumatic.  Eyes: Conjunctivae are normal. No scleral icterus.  Cardiovascular: Normal rate and regular rhythm.   Pulmonary/Chest: Effort normal. No respiratory distress. He has no wheezes. He has no rales.  Abdominal: Soft. There is no tenderness. There is no rebound.    Has a ~10cm round area of induration on his left abdomen  Musculoskeletal: He exhibits edema. He exhibits no tenderness.  Trace pitting edema bilaterally.  Right foot s/p ray amputation with no ulcers, left foot with no ulcers.  Left shoulder with well healed surgical scar, no wound/ulcer  Neurological: He is alert and oriented to person, place, and time.  Skin: Rash noted. He is not diaphoretic.  Faintly erythematous macular rash under his panus and in the groin area  Psychiatric: He has a normal mood and affect.  Nursing note and vitals reviewed.         Assessment & Plan:

## 2014-10-17 NOTE — Assessment & Plan Note (Signed)
He has had chronic leucocytosis since 2009. No active infectious process seen except for mild intertrigo under his panus. No fever/chills, does not appear toxic.  Will treat intertrigo--nystatin powder per mother's preference.

## 2014-10-17 NOTE — Assessment & Plan Note (Signed)
His Hg improved after iron infusion during his hospitalization.  Will need monitoring by Nephrology.

## 2014-10-21 NOTE — Progress Notes (Signed)
CSW spoke with RN Liaison, Dorian Pod regarding Mr. Battie.  Generally facility weight limit is 450lbs, pt will need home visit by admission coordinator.  CSW provided pt's address and phone numbers.

## 2014-10-21 NOTE — Telephone Encounter (Signed)
CSW received message from [facility].  Due to pt's weight, SNF RN Liaison will need to complete community assessment prior to acceptance.  RN Liaison will not be back until 10/21/14.

## 2014-10-22 DIAGNOSIS — M25512 Pain in left shoulder: Secondary | ICD-10-CM | POA: Diagnosis not present

## 2014-10-22 DIAGNOSIS — M25571 Pain in right ankle and joints of right foot: Secondary | ICD-10-CM | POA: Diagnosis not present

## 2014-10-22 DIAGNOSIS — N189 Chronic kidney disease, unspecified: Secondary | ICD-10-CM | POA: Diagnosis not present

## 2014-10-22 DIAGNOSIS — G8929 Other chronic pain: Secondary | ICD-10-CM | POA: Diagnosis not present

## 2014-10-22 DIAGNOSIS — E119 Type 2 diabetes mellitus without complications: Secondary | ICD-10-CM | POA: Diagnosis not present

## 2014-10-22 DIAGNOSIS — I129 Hypertensive chronic kidney disease with stage 1 through stage 4 chronic kidney disease, or unspecified chronic kidney disease: Secondary | ICD-10-CM | POA: Diagnosis not present

## 2014-10-22 DIAGNOSIS — Z794 Long term (current) use of insulin: Secondary | ICD-10-CM | POA: Diagnosis not present

## 2014-10-23 DIAGNOSIS — I129 Hypertensive chronic kidney disease with stage 1 through stage 4 chronic kidney disease, or unspecified chronic kidney disease: Secondary | ICD-10-CM | POA: Diagnosis not present

## 2014-10-23 DIAGNOSIS — M25571 Pain in right ankle and joints of right foot: Secondary | ICD-10-CM | POA: Diagnosis not present

## 2014-10-23 DIAGNOSIS — N189 Chronic kidney disease, unspecified: Secondary | ICD-10-CM | POA: Diagnosis not present

## 2014-10-23 DIAGNOSIS — G8929 Other chronic pain: Secondary | ICD-10-CM | POA: Diagnosis not present

## 2014-10-23 DIAGNOSIS — M25512 Pain in left shoulder: Secondary | ICD-10-CM | POA: Diagnosis not present

## 2014-10-23 DIAGNOSIS — E119 Type 2 diabetes mellitus without complications: Secondary | ICD-10-CM | POA: Diagnosis not present

## 2014-10-23 DIAGNOSIS — Z794 Long term (current) use of insulin: Secondary | ICD-10-CM | POA: Diagnosis not present

## 2014-10-24 ENCOUNTER — Ambulatory Visit (INDEPENDENT_AMBULATORY_CARE_PROVIDER_SITE_OTHER): Payer: Medicare Other | Admitting: Internal Medicine

## 2014-10-24 ENCOUNTER — Encounter: Payer: Self-pay | Admitting: Internal Medicine

## 2014-10-24 VITALS — BP 165/99 | HR 95 | Temp 97.6°F

## 2014-10-24 DIAGNOSIS — Z794 Long term (current) use of insulin: Secondary | ICD-10-CM

## 2014-10-24 DIAGNOSIS — N189 Chronic kidney disease, unspecified: Secondary | ICD-10-CM | POA: Diagnosis not present

## 2014-10-24 DIAGNOSIS — E118 Type 2 diabetes mellitus with unspecified complications: Secondary | ICD-10-CM

## 2014-10-24 DIAGNOSIS — Z6841 Body Mass Index (BMI) 40.0 and over, adult: Secondary | ICD-10-CM

## 2014-10-24 DIAGNOSIS — I152 Hypertension secondary to endocrine disorders: Secondary | ICD-10-CM

## 2014-10-24 DIAGNOSIS — E1122 Type 2 diabetes mellitus with diabetic chronic kidney disease: Secondary | ICD-10-CM

## 2014-10-24 DIAGNOSIS — R5381 Other malaise: Secondary | ICD-10-CM | POA: Diagnosis not present

## 2014-10-24 DIAGNOSIS — M79601 Pain in right arm: Secondary | ICD-10-CM

## 2014-10-24 DIAGNOSIS — N289 Disorder of kidney and ureter, unspecified: Secondary | ICD-10-CM | POA: Diagnosis not present

## 2014-10-24 DIAGNOSIS — E1165 Type 2 diabetes mellitus with hyperglycemia: Secondary | ICD-10-CM | POA: Diagnosis not present

## 2014-10-24 DIAGNOSIS — I129 Hypertensive chronic kidney disease with stage 1 through stage 4 chronic kidney disease, or unspecified chronic kidney disease: Secondary | ICD-10-CM | POA: Diagnosis not present

## 2014-10-24 DIAGNOSIS — IMO0002 Reserved for concepts with insufficient information to code with codable children: Secondary | ICD-10-CM

## 2014-10-24 DIAGNOSIS — N184 Chronic kidney disease, stage 4 (severe): Secondary | ICD-10-CM

## 2014-10-24 LAB — GLUCOSE, CAPILLARY: GLUCOSE-CAPILLARY: 181 mg/dL — AB (ref 70–99)

## 2014-10-24 MED ORDER — INSULIN GLARGINE 100 UNITS/ML SOLOSTAR PEN: 30.0000 [IU] | PEN_INJECTOR | Freq: Every day | SUBCUTANEOUS | Status: DC

## 2014-10-24 MED ORDER — INSULIN ASPART 100 UNIT/ML FLEXPEN: 10.0000 [IU] | PEN_INJECTOR | Freq: Every day | SUBCUTANEOUS | Status: DC

## 2014-10-24 MED ORDER — HYDRALAZINE HCL 25 MG PO TABS: 25.0000 mg | ORAL_TABLET | Freq: Three times a day (TID) | ORAL | Status: DC

## 2014-10-24 NOTE — Assessment & Plan Note (Signed)
Lab Results  Component Value Date   CREATININE 4.24* 10/16/2014   CREATININE 3.96* 10/07/2014   CREATININE 3.97* 10/05/2014   Rise in creatinine does not meet criteria for AKI and likely within normal variation for someone with this poor kidney function.  Referral to nephrology in place, and I agree follow-up with nephrology would be helpful.  He is refusing blood draw today due to his right arm pain.  Continues to have difficulty with low phosphorus diet. -Continue referral to nephrology. -Recheck BMP, P, CBC at next visit.

## 2014-10-24 NOTE — Assessment & Plan Note (Addendum)
Arm pain appears to be due to bruising from previous blood draw.  He has a significant phobia of needles and low pain threshold.  In addition, he has been using his arm more frequently with physical therapy.  No evidence of thrombophlebitis. -Tylenol 650 mg q4h for pain.

## 2014-10-24 NOTE — Assessment & Plan Note (Signed)
Severely obese and unable to care for self.  Limited by previous osteomyelitis of left arm.  I agree that a SNF would be ideal to improve his strength and independence and to start him towards weight loss.  Per Edwena Blow, SNF plans to send admissions coordinator to assess his ability to come to the facility.  Mother has not heard form SNF. -Sent message to Edwena Blow to make sure SNF can get in touch with patient's mother.

## 2014-10-24 NOTE — Progress Notes (Signed)
Subjective:    Patient ID: Adrian Khan, male    DOB: May 25, 1976, 39 y.o.   MRN: [id number]  HPI Adrian Khan is a 39 year old man with history of severe obesity, uncontrolled DM2 with nephropathy and hx of osteomyelitis s/p R foot ray amputation, and HTN presenting for follow up.  He was brought from home via EMS due to his severe obesity, and the history was obtained from him and his mother.  He was recently hospitalized with community acquired pneumonia and noted to have creatinine elevated from his baseline, which was thought to be due to worsening of his chronic kidney disease.  He followed up last week in [facility], and he was noted to have elevated blood pressure and blood sugars.  In addition, his creatinine was noted to be slightly above discharge at 4.24 from 3.96.  He was referred to nephrology and for admission to a SNF for continued rehabilitation.  He has yet to see nephrology and the SNF admission coordinator will need to visit him at home because his weight is above the facility weight limit.  He reports getting new insulin needles that have been less painful.  He has been taking Lantus 24 units at bedtime, but he has only been getting Novolog once per day, typically with dinner.  His mother has also been applying a sliding scale of up to 25 units of Novolog with dinner, but his blood sugars have remained mostly in the 300s.  His blood pressure has been running around the 387F systolic at home as measured by his mother, and his home health aide.  He reports having pain in his right arm for the past two days.  He thinks it was related to where he had blood drawn at his last clinic visit.  He also can exclusively use his R arm for activity because of previous osteomyelitis of his L shoulder.  He has been working with PT to increase his mobility and work on getting dressed on his own.  He has some congestion, but he denies shortness of breath, chest pain, fevers, chills, or  diaphoresis.  Review of Systems  Constitutional: Negative for fever and chills.  HENT: Positive for congestion, rhinorrhea and sneezing. Negative for sore throat.   Respiratory: Negative for cough, chest tightness and shortness of breath.   Cardiovascular: Negative for chest pain.  Gastrointestinal: Negative for nausea, vomiting, abdominal pain, diarrhea and constipation.  Genitourinary: Negative for difficulty urinating.  Musculoskeletal: Positive for myalgias and arthralgias. Negative for back pain.  Skin: Positive for rash (Dark skin on R arm.).  Neurological: Negative for dizziness, weakness and numbness.       Objective:   Physical Exam  Constitutional: He is oriented to person, place, and time. No distress.  Severely obese, unable to get off of EMS stretcher.  HENT:  Head: Normocephalic and atraumatic.  Mouth/Throat: Oropharyngeal exudate present.  Eyes: Conjunctivae and EOM are normal. Pupils are equal, round, and reactive to light. No scleral icterus.  Cardiovascular: Normal rate, regular rhythm and normal heart sounds.   Pulmonary/Chest: Effort normal and breath sounds normal. No respiratory distress. He has no wheezes. He exhibits no tenderness.  Some stridor due to body habitus.  Abdominal: Soft. Bowel sounds are normal. He exhibits no distension. There is no tenderness.  Musculoskeletal: Normal range of motion. He exhibits tenderness (Right anterior elbow at sight of previous blood draw.). He exhibits no edema.  Range of motion limited in left arm due to previous  osteomyelitis.  Neurological: He is alert and oriented to person, place, and time. He exhibits normal muscle tone.  Skin: Skin is warm and dry.  Two 1 cm areas of bruising on anterior elbow at sight of previous blood draw.  No evidence of swelling, clotting, or inflammation.          Assessment & Plan:  Please see problem-based assessment and plan.

## 2014-10-24 NOTE — Assessment & Plan Note (Signed)
Lab Results  Component Value Date   HGBA1C 10.4 10/01/2014   HGBA1C 13.7* 07/25/2013   HGBA1C 11.5* 09/22/2012     Assessment: Diabetes control: poor control (HgbA1C >9%) Progress toward A1C goal:  Improved from prior Comments: Blood sugars continue to be elevated.  Patient has severe phobia of needles, and he is having trouble adjusting to insulin injections.  He is only getting Novolog one time per day, and his mother is using a sliding scale not prescribed.  Will change regimen to make it more reasonable for compliance.  He has tolerated up to 25 units of Novolog in addition to his current Lantus.  Plan: Medications:  Increase Lantus to 30 units at bedtime, change Novolog to 10 units with largest meal. Home glucose monitoring: Frequency: once a day Timing: before breakfast Instruction/counseling given: reminded to bring blood glucose meter & log to each visit, reminded to bring medications to each visit, discussed the need for weight loss and discussed diet Other plans: Return to clinic in 4-6 weeks to monitor glucose control.

## 2014-10-24 NOTE — Patient Instructions (Signed)
Thank you for coming to clinic today Adrian Khan.  General instructions: -Your arm pain is probably from some bruising due to your blood draw last week.  This is likely aggravated by having to use it so frequently. -You can take acetaminophen 650 mg every 4 hours to help with the pain. -I changed your insulin doses.  Instead of taking Novolog three times per day, just take 10 units with your biggest meal (dinner).  Also, increase your Lantus to 30 units at bedtime. -To help with your blood pressure, I increased your hydralazine to 25 mg three times per day. -Please make a follow up appointment to return to clinic in 4-6 weeks.  Thank you for bringing your medicines today. This helps [place] keep you safe from mistakes.

## 2014-10-24 NOTE — Assessment & Plan Note (Signed)
BP Readings from Last 3 Encounters:  10/24/14 165/99  10/16/14 156/76  10/07/14 151/71    Lab Results  Component Value Date   NA 137 10/16/2014   K 4.9 10/16/2014   CREATININE 4.24* 10/16/2014    Assessment: Blood pressure control: moderately elevated Progress toward BP goal:  unchanged Comments: Consistently elevated since hospital discharge.  No benefit from ACE inhibitor with current kidney function.  Plan: Medications:  Increase hydralazine to 25 mg TID, continue amlodipine 10 mg daily. Other plans: Consider adding beta blocker if BP not controlled with increased hydralazine.

## 2014-10-25 ENCOUNTER — Telehealth: Payer: Self-pay | Admitting: Licensed Clinical Social Worker

## 2014-10-25 ENCOUNTER — Telehealth: Payer: Self-pay | Admitting: *Deleted

## 2014-10-25 DIAGNOSIS — I129 Hypertensive chronic kidney disease with stage 1 through stage 4 chronic kidney disease, or unspecified chronic kidney disease: Secondary | ICD-10-CM | POA: Diagnosis not present

## 2014-10-25 DIAGNOSIS — G8929 Other chronic pain: Secondary | ICD-10-CM | POA: Diagnosis not present

## 2014-10-25 DIAGNOSIS — E119 Type 2 diabetes mellitus without complications: Secondary | ICD-10-CM | POA: Diagnosis not present

## 2014-10-25 DIAGNOSIS — N189 Chronic kidney disease, unspecified: Secondary | ICD-10-CM | POA: Diagnosis not present

## 2014-10-25 DIAGNOSIS — Z794 Long term (current) use of insulin: Secondary | ICD-10-CM | POA: Diagnosis not present

## 2014-10-25 DIAGNOSIS — M25512 Pain in left shoulder: Secondary | ICD-10-CM | POA: Diagnosis not present

## 2014-10-25 DIAGNOSIS — M25571 Pain in right ankle and joints of right foot: Secondary | ICD-10-CM | POA: Diagnosis not present

## 2014-10-25 NOTE — Telephone Encounter (Signed)
That is fine 

## 2014-10-25 NOTE — Progress Notes (Signed)
Case discussed with Dr. Trudee Kuster at the time of the visit.  We reviewed the resident's history and exam and pertinent patient test results.  I agree with the assessment, diagnosis and plan of care documented in the resident's note.

## 2014-10-25 NOTE — Telephone Encounter (Signed)
[name] left several voice mails for CSW regarding pt's admission to [facility].  CSW returned call today.  Mother notified, [facility] has completed all necessary paperwork.  Last conversation CSW had with SNF, SNF needed to come out and complete a community assessment.  CSW provided Ms. Tinoco with contact information for [facility], encourage mother to contact and schedule assessment.

## 2014-10-25 NOTE — Telephone Encounter (Signed)
[facility] informed 

## 2014-10-25 NOTE — Telephone Encounter (Signed)
Call from [place], Physical Therapist with [facility] [phone]  PT is asking for Verbal Orders to continue home PT and OT twice a week for 2 more weeks. Will this be okay with you?

## 2014-10-28 DIAGNOSIS — E119 Type 2 diabetes mellitus without complications: Secondary | ICD-10-CM | POA: Diagnosis not present

## 2014-10-28 DIAGNOSIS — M25571 Pain in right ankle and joints of right foot: Secondary | ICD-10-CM | POA: Diagnosis not present

## 2014-10-28 DIAGNOSIS — Z794 Long term (current) use of insulin: Secondary | ICD-10-CM | POA: Diagnosis not present

## 2014-10-28 DIAGNOSIS — I129 Hypertensive chronic kidney disease with stage 1 through stage 4 chronic kidney disease, or unspecified chronic kidney disease: Secondary | ICD-10-CM | POA: Diagnosis not present

## 2014-10-28 DIAGNOSIS — N189 Chronic kidney disease, unspecified: Secondary | ICD-10-CM | POA: Diagnosis not present

## 2014-10-28 DIAGNOSIS — G8929 Other chronic pain: Secondary | ICD-10-CM | POA: Diagnosis not present

## 2014-10-28 DIAGNOSIS — M25512 Pain in left shoulder: Secondary | ICD-10-CM | POA: Diagnosis not present

## 2014-10-29 DIAGNOSIS — I129 Hypertensive chronic kidney disease with stage 1 through stage 4 chronic kidney disease, or unspecified chronic kidney disease: Secondary | ICD-10-CM | POA: Diagnosis not present

## 2014-10-29 DIAGNOSIS — G8929 Other chronic pain: Secondary | ICD-10-CM | POA: Diagnosis not present

## 2014-10-29 DIAGNOSIS — E119 Type 2 diabetes mellitus without complications: Secondary | ICD-10-CM | POA: Diagnosis not present

## 2014-10-29 DIAGNOSIS — M25571 Pain in right ankle and joints of right foot: Secondary | ICD-10-CM | POA: Diagnosis not present

## 2014-10-29 DIAGNOSIS — Z794 Long term (current) use of insulin: Secondary | ICD-10-CM | POA: Diagnosis not present

## 2014-10-29 DIAGNOSIS — N189 Chronic kidney disease, unspecified: Secondary | ICD-10-CM | POA: Diagnosis not present

## 2014-10-29 DIAGNOSIS — M25512 Pain in left shoulder: Secondary | ICD-10-CM | POA: Diagnosis not present

## 2014-11-01 ENCOUNTER — Telehealth: Payer: Self-pay | Admitting: *Deleted

## 2014-11-01 DIAGNOSIS — I129 Hypertensive chronic kidney disease with stage 1 through stage 4 chronic kidney disease, or unspecified chronic kidney disease: Secondary | ICD-10-CM | POA: Diagnosis not present

## 2014-11-01 DIAGNOSIS — M25512 Pain in left shoulder: Secondary | ICD-10-CM | POA: Diagnosis not present

## 2014-11-01 DIAGNOSIS — G8929 Other chronic pain: Secondary | ICD-10-CM | POA: Diagnosis not present

## 2014-11-01 DIAGNOSIS — Z794 Long term (current) use of insulin: Secondary | ICD-10-CM | POA: Diagnosis not present

## 2014-11-01 DIAGNOSIS — N189 Chronic kidney disease, unspecified: Secondary | ICD-10-CM | POA: Diagnosis not present

## 2014-11-01 DIAGNOSIS — M25571 Pain in right ankle and joints of right foot: Secondary | ICD-10-CM | POA: Diagnosis not present

## 2014-11-01 DIAGNOSIS — E119 Type 2 diabetes mellitus without complications: Secondary | ICD-10-CM | POA: Diagnosis not present

## 2014-11-01 NOTE — Telephone Encounter (Signed)
That would be fine 

## 2014-11-01 NOTE — Telephone Encounter (Signed)
Call from [place], [place] with [facility] [phone]  Physical Therapist is calling to ask for Verbal Order for Social Worker. She realizes we were working  On placement to rehab .  Home assessment was done and patient dose not qualify. Pt was d/c on 12/21 and they would like to work on getting him into a inpatient facility so he can get out of the house. Will a Haematologist be OK?

## 2014-11-01 NOTE — Telephone Encounter (Signed)
Message left for social worker to consult.

## 2014-11-04 DIAGNOSIS — N189 Chronic kidney disease, unspecified: Secondary | ICD-10-CM | POA: Diagnosis not present

## 2014-11-04 DIAGNOSIS — M25571 Pain in right ankle and joints of right foot: Secondary | ICD-10-CM | POA: Diagnosis not present

## 2014-11-04 DIAGNOSIS — M25512 Pain in left shoulder: Secondary | ICD-10-CM | POA: Diagnosis not present

## 2014-11-04 DIAGNOSIS — G8929 Other chronic pain: Secondary | ICD-10-CM | POA: Diagnosis not present

## 2014-11-04 DIAGNOSIS — Z794 Long term (current) use of insulin: Secondary | ICD-10-CM | POA: Diagnosis not present

## 2014-11-04 DIAGNOSIS — I129 Hypertensive chronic kidney disease with stage 1 through stage 4 chronic kidney disease, or unspecified chronic kidney disease: Secondary | ICD-10-CM | POA: Diagnosis not present

## 2014-11-04 DIAGNOSIS — E119 Type 2 diabetes mellitus without complications: Secondary | ICD-10-CM | POA: Diagnosis not present

## 2014-11-05 DIAGNOSIS — E119 Type 2 diabetes mellitus without complications: Secondary | ICD-10-CM | POA: Diagnosis not present

## 2014-11-05 DIAGNOSIS — I129 Hypertensive chronic kidney disease with stage 1 through stage 4 chronic kidney disease, or unspecified chronic kidney disease: Secondary | ICD-10-CM | POA: Diagnosis not present

## 2014-11-05 DIAGNOSIS — G8929 Other chronic pain: Secondary | ICD-10-CM | POA: Diagnosis not present

## 2014-11-05 DIAGNOSIS — M25512 Pain in left shoulder: Secondary | ICD-10-CM | POA: Diagnosis not present

## 2014-11-05 DIAGNOSIS — Z794 Long term (current) use of insulin: Secondary | ICD-10-CM | POA: Diagnosis not present

## 2014-11-05 DIAGNOSIS — N189 Chronic kidney disease, unspecified: Secondary | ICD-10-CM | POA: Diagnosis not present

## 2014-11-05 DIAGNOSIS — M25571 Pain in right ankle and joints of right foot: Secondary | ICD-10-CM | POA: Diagnosis not present

## 2014-11-06 ENCOUNTER — Telehealth: Payer: Self-pay | Admitting: Licensed Clinical Social Worker

## 2014-11-06 DIAGNOSIS — I129 Hypertensive chronic kidney disease with stage 1 through stage 4 chronic kidney disease, or unspecified chronic kidney disease: Secondary | ICD-10-CM | POA: Diagnosis not present

## 2014-11-06 DIAGNOSIS — G8929 Other chronic pain: Secondary | ICD-10-CM | POA: Diagnosis not present

## 2014-11-06 DIAGNOSIS — N189 Chronic kidney disease, unspecified: Secondary | ICD-10-CM | POA: Diagnosis not present

## 2014-11-06 DIAGNOSIS — E119 Type 2 diabetes mellitus without complications: Secondary | ICD-10-CM | POA: Diagnosis not present

## 2014-11-06 DIAGNOSIS — Z794 Long term (current) use of insulin: Secondary | ICD-10-CM | POA: Diagnosis not present

## 2014-11-06 DIAGNOSIS — M25512 Pain in left shoulder: Secondary | ICD-10-CM | POA: Diagnosis not present

## 2014-11-06 DIAGNOSIS — M25571 Pain in right ankle and joints of right foot: Secondary | ICD-10-CM | POA: Diagnosis not present

## 2014-11-06 NOTE — Telephone Encounter (Signed)
Discussion between [facility] SW and this worker.  [facility] requesting CSW to fax FL-2 to [place] for review.  In addition, to [facility], requesting FL-2 faxed directly to [facility] SW to facilitate attempt at other agencies for admission.  CSW faxed FL-2 to both [facility] and [facility] SW. [facility] SW requesting PCS form to be initiated.  Family initially had wanted to wait for PCS until after d/c from SNF, however, with delay in placement options; form initiated.

## 2014-11-06 NOTE — Telephone Encounter (Signed)
CSW received voicemail from [facility] SW Navigator inquiring if FL-2 had been sent to multiple SNF's form placement.  CSW returned call and left message stating this CSW only sent information to [facility] per mother's request.

## 2014-11-07 DIAGNOSIS — I129 Hypertensive chronic kidney disease with stage 1 through stage 4 chronic kidney disease, or unspecified chronic kidney disease: Secondary | ICD-10-CM | POA: Diagnosis not present

## 2014-11-07 DIAGNOSIS — M25571 Pain in right ankle and joints of right foot: Secondary | ICD-10-CM | POA: Diagnosis not present

## 2014-11-07 DIAGNOSIS — M25512 Pain in left shoulder: Secondary | ICD-10-CM | POA: Diagnosis not present

## 2014-11-07 DIAGNOSIS — E119 Type 2 diabetes mellitus without complications: Secondary | ICD-10-CM | POA: Diagnosis not present

## 2014-11-07 DIAGNOSIS — G8929 Other chronic pain: Secondary | ICD-10-CM | POA: Diagnosis not present

## 2014-11-07 DIAGNOSIS — Z794 Long term (current) use of insulin: Secondary | ICD-10-CM | POA: Diagnosis not present

## 2014-11-07 DIAGNOSIS — N189 Chronic kidney disease, unspecified: Secondary | ICD-10-CM | POA: Diagnosis not present

## 2014-11-12 ENCOUNTER — Telehealth: Payer: Self-pay | Admitting: *Deleted

## 2014-11-12 DIAGNOSIS — B351 Tinea unguium: Secondary | ICD-10-CM | POA: Diagnosis not present

## 2014-11-12 DIAGNOSIS — E1142 Type 2 diabetes mellitus with diabetic polyneuropathy: Secondary | ICD-10-CM | POA: Diagnosis not present

## 2014-11-12 DIAGNOSIS — S91302A Unspecified open wound, left foot, initial encounter: Secondary | ICD-10-CM | POA: Diagnosis not present

## 2014-11-12 DIAGNOSIS — M79672 Pain in left foot: Secondary | ICD-10-CM | POA: Diagnosis not present

## 2014-11-12 NOTE — Telephone Encounter (Signed)
Call from [place] - # [phone]  OT is asking for Verbal Order to continue OT 2 times a week for 3 more weeks. They are waiting for pt to be admitting to skilled nursing for continued  Rehab.but until then she wants to continue at home.  I gave the order, is this okay with you?

## 2014-11-12 NOTE — Telephone Encounter (Signed)
That's fine. Thanks!

## 2014-11-13 DIAGNOSIS — N189 Chronic kidney disease, unspecified: Secondary | ICD-10-CM | POA: Diagnosis not present

## 2014-11-13 DIAGNOSIS — M25512 Pain in left shoulder: Secondary | ICD-10-CM | POA: Diagnosis not present

## 2014-11-13 DIAGNOSIS — G8929 Other chronic pain: Secondary | ICD-10-CM | POA: Diagnosis not present

## 2014-11-13 DIAGNOSIS — E119 Type 2 diabetes mellitus without complications: Secondary | ICD-10-CM | POA: Diagnosis not present

## 2014-11-13 DIAGNOSIS — M25571 Pain in right ankle and joints of right foot: Secondary | ICD-10-CM | POA: Diagnosis not present

## 2014-11-13 DIAGNOSIS — Z794 Long term (current) use of insulin: Secondary | ICD-10-CM | POA: Diagnosis not present

## 2014-11-13 DIAGNOSIS — I129 Hypertensive chronic kidney disease with stage 1 through stage 4 chronic kidney disease, or unspecified chronic kidney disease: Secondary | ICD-10-CM | POA: Diagnosis not present

## 2014-11-14 ENCOUNTER — Other Ambulatory Visit: Payer: Self-pay | Admitting: Internal Medicine

## 2014-11-15 DIAGNOSIS — M25512 Pain in left shoulder: Secondary | ICD-10-CM | POA: Diagnosis not present

## 2014-11-15 DIAGNOSIS — E119 Type 2 diabetes mellitus without complications: Secondary | ICD-10-CM | POA: Diagnosis not present

## 2014-11-15 DIAGNOSIS — Z794 Long term (current) use of insulin: Secondary | ICD-10-CM | POA: Diagnosis not present

## 2014-11-15 DIAGNOSIS — M25571 Pain in right ankle and joints of right foot: Secondary | ICD-10-CM | POA: Diagnosis not present

## 2014-11-15 DIAGNOSIS — I129 Hypertensive chronic kidney disease with stage 1 through stage 4 chronic kidney disease, or unspecified chronic kidney disease: Secondary | ICD-10-CM | POA: Diagnosis not present

## 2014-11-15 DIAGNOSIS — G8929 Other chronic pain: Secondary | ICD-10-CM | POA: Diagnosis not present

## 2014-11-15 DIAGNOSIS — N189 Chronic kidney disease, unspecified: Secondary | ICD-10-CM | POA: Diagnosis not present

## 2014-11-19 DIAGNOSIS — M25571 Pain in right ankle and joints of right foot: Secondary | ICD-10-CM | POA: Diagnosis not present

## 2014-11-19 DIAGNOSIS — N189 Chronic kidney disease, unspecified: Secondary | ICD-10-CM | POA: Diagnosis not present

## 2014-11-19 DIAGNOSIS — Z794 Long term (current) use of insulin: Secondary | ICD-10-CM | POA: Diagnosis not present

## 2014-11-19 DIAGNOSIS — I129 Hypertensive chronic kidney disease with stage 1 through stage 4 chronic kidney disease, or unspecified chronic kidney disease: Secondary | ICD-10-CM | POA: Diagnosis not present

## 2014-11-19 DIAGNOSIS — G8929 Other chronic pain: Secondary | ICD-10-CM | POA: Diagnosis not present

## 2014-11-19 DIAGNOSIS — E119 Type 2 diabetes mellitus without complications: Secondary | ICD-10-CM | POA: Diagnosis not present

## 2014-11-19 DIAGNOSIS — M25512 Pain in left shoulder: Secondary | ICD-10-CM | POA: Diagnosis not present

## 2014-11-20 DIAGNOSIS — M25512 Pain in left shoulder: Secondary | ICD-10-CM | POA: Diagnosis not present

## 2014-11-20 DIAGNOSIS — M25571 Pain in right ankle and joints of right foot: Secondary | ICD-10-CM | POA: Diagnosis not present

## 2014-11-20 DIAGNOSIS — G8929 Other chronic pain: Secondary | ICD-10-CM | POA: Diagnosis not present

## 2014-11-20 DIAGNOSIS — Z794 Long term (current) use of insulin: Secondary | ICD-10-CM | POA: Diagnosis not present

## 2014-11-20 DIAGNOSIS — E119 Type 2 diabetes mellitus without complications: Secondary | ICD-10-CM | POA: Diagnosis not present

## 2014-11-20 DIAGNOSIS — N189 Chronic kidney disease, unspecified: Secondary | ICD-10-CM | POA: Diagnosis not present

## 2014-11-20 DIAGNOSIS — I129 Hypertensive chronic kidney disease with stage 1 through stage 4 chronic kidney disease, or unspecified chronic kidney disease: Secondary | ICD-10-CM | POA: Diagnosis not present

## 2014-11-22 DIAGNOSIS — M25571 Pain in right ankle and joints of right foot: Secondary | ICD-10-CM | POA: Diagnosis not present

## 2014-11-22 DIAGNOSIS — N189 Chronic kidney disease, unspecified: Secondary | ICD-10-CM | POA: Diagnosis not present

## 2014-11-22 DIAGNOSIS — E119 Type 2 diabetes mellitus without complications: Secondary | ICD-10-CM | POA: Diagnosis not present

## 2014-11-22 DIAGNOSIS — Z794 Long term (current) use of insulin: Secondary | ICD-10-CM | POA: Diagnosis not present

## 2014-11-22 DIAGNOSIS — I129 Hypertensive chronic kidney disease with stage 1 through stage 4 chronic kidney disease, or unspecified chronic kidney disease: Secondary | ICD-10-CM | POA: Diagnosis not present

## 2014-11-22 DIAGNOSIS — G8929 Other chronic pain: Secondary | ICD-10-CM | POA: Diagnosis not present

## 2014-11-22 DIAGNOSIS — M25512 Pain in left shoulder: Secondary | ICD-10-CM | POA: Diagnosis not present

## 2014-11-25 ENCOUNTER — Ambulatory Visit (INDEPENDENT_AMBULATORY_CARE_PROVIDER_SITE_OTHER): Payer: Medicare Other | Admitting: Internal Medicine

## 2014-11-25 ENCOUNTER — Encounter: Payer: Self-pay | Admitting: Internal Medicine

## 2014-11-25 VITALS — BP 175/100 | HR 94 | Temp 98.1°F

## 2014-11-25 DIAGNOSIS — E118 Type 2 diabetes mellitus with unspecified complications: Principal | ICD-10-CM

## 2014-11-25 DIAGNOSIS — N184 Chronic kidney disease, stage 4 (severe): Secondary | ICD-10-CM

## 2014-11-25 DIAGNOSIS — Z794 Long term (current) use of insulin: Secondary | ICD-10-CM

## 2014-11-25 DIAGNOSIS — I129 Hypertensive chronic kidney disease with stage 1 through stage 4 chronic kidney disease, or unspecified chronic kidney disease: Secondary | ICD-10-CM | POA: Diagnosis not present

## 2014-11-25 DIAGNOSIS — R5381 Other malaise: Secondary | ICD-10-CM

## 2014-11-25 DIAGNOSIS — E1122 Type 2 diabetes mellitus with diabetic chronic kidney disease: Secondary | ICD-10-CM

## 2014-11-25 DIAGNOSIS — IMO0002 Reserved for concepts with insufficient information to code with codable children: Secondary | ICD-10-CM

## 2014-11-25 DIAGNOSIS — Z0000 Encounter for general adult medical examination without abnormal findings: Secondary | ICD-10-CM | POA: Diagnosis not present

## 2014-11-25 DIAGNOSIS — E1165 Type 2 diabetes mellitus with hyperglycemia: Secondary | ICD-10-CM | POA: Diagnosis not present

## 2014-11-25 DIAGNOSIS — G589 Mononeuropathy, unspecified: Secondary | ICD-10-CM | POA: Diagnosis not present

## 2014-11-25 DIAGNOSIS — I152 Hypertension secondary to endocrine disorders: Secondary | ICD-10-CM

## 2014-11-25 LAB — CBC
HCT: 33.4 % — ABNORMAL LOW (ref 39.0–52.0)
Hemoglobin: 10.6 g/dL — ABNORMAL LOW (ref 13.0–17.0)
MCH: 28.5 pg (ref 26.0–34.0)
MCHC: 31.7 g/dL (ref 30.0–36.0)
MCV: 89.8 fL (ref 78.0–100.0)
MPV: 10.8 fL (ref 8.6–12.4)
Platelets: 355 10*3/uL (ref 150–400)
RBC: 3.72 MIL/uL — ABNORMAL LOW (ref 4.22–5.81)
RDW: 12.9 % (ref 11.5–15.5)
WBC: 16 10*3/uL — AB (ref 4.0–10.5)

## 2014-11-25 LAB — POCT GLYCOSYLATED HEMOGLOBIN (HGB A1C): Hemoglobin A1C: 8.6

## 2014-11-25 NOTE — Progress Notes (Signed)
Internal Medicine Clinic Attending  Case discussed with Dr. Patel at the time of the visit.  We reviewed the resident's history and exam and pertinent patient test results.  I agree with the assessment, diagnosis, and plan of care documented in the resident's note.  

## 2014-11-25 NOTE — Assessment & Plan Note (Signed)
Lab Results  Component Value Date   HGBA1C 8.6 11/25/2014   HGBA1C 10.4 10/01/2014   HGBA1C 13.7* 07/25/2013     Assessment: Diabetes control: fair control Progress toward A1C goal:  improved Comments:  -He reports that he is currently trying to work on eating better though had a piece of cake, chicken nuggets last night.  -Does not take insulin every day because it causes him to have an itching reaction on his right arm.  -Not agreeable to having injections in his abdomen or his thigh.  -Mother checks his sugar once a day and notes that it mainly runs in the 200s.  -Reports taking insulin every other day.  -Does not understand the link between his poor glycemic control and his kidney dysfunction.  -Would like to transition off insulin to an oral agent. -Glucometer shows mostly values trending in the 200s  Plan: Medications:  Lantus 30 units daily at bedtime, NovoLog 10 units with meals Home glucose monitoring: Frequency: once a day Timing: before breakfast Instruction/counseling given: reminded to bring blood glucose meter & log to each visit Educational resources provided:   Self management tools provided:   Other plans:  -Defer changing his current insulin regimen until the next appointment -Advised him that he needs to take his insulin regularly  -Follow-up in 4-6 weeks

## 2014-11-25 NOTE — Patient Instructions (Addendum)
Thank you for bringing your medicines today. This helps [place] keep you safe from mistakes.  For your kidneys, it is important you control your blood pressure and blood sugar levels.   Please continue taking your insulin three times a day with meals as well as with your Lantus.   For your blood pressure, please keep a log so that we can review your readings here.  We will touch base with Adrian Khan about your rehab referral.

## 2014-11-25 NOTE — Assessment & Plan Note (Addendum)
-  He was agreeable to having his blood drawn today to check his BMET, CBC, phosphorus. -His mother was confused that she had artery seen the nephrologist here in the [facility] clinic though I explained that we do not have any nephrologist on staff.  ADDENDUM 11/28/2014  1:48 PM:  -PTH 257 is elevated above the range corresponding with CKD Stage IV, 70-110 pg/mL, per KDOQI guidelines.  -Corrected calcium 8.2 as of December 2015 though phosphorus is elevated at 5.5.  -No Vitamin D on file and will need to be checked at the next visit to assess for the need to supplement with ergocalciferol.

## 2014-11-25 NOTE — Assessment & Plan Note (Signed)
BP Readings from Last 3 Encounters:  11/25/14 175/100  10/24/14 165/99  10/16/14 156/76    Lab Results  Component Value Date   NA 137 10/16/2014   K 4.9 10/16/2014   CREATININE 4.24* 10/16/2014    Assessment: Blood pressure control: moderately elevated (Readings are much better at home per the machine that they have brought with them, most recent reading was 135/79) Progress toward BP goal:  unable to assess Comments:  -Blood pressure markedly elevated today though per mother does not accurately reflect the readings she gets at home -Home machine takes blood pressure from wrist which may confound the values that it gets -Unable to compare directly with clinic blood pressure machines and home blood pressure machine has home blood pressure machine had run out of batteries during the visit  Plan: Medications:  Hydralazine 25 mg 3 times a day, Norvasc 10 mg Educational resources provided:   Self management tools provided:   Other plans:  -Advised patient and his mother to keep track of blood pressure using the provided log -Plan to follow-up in 4-6 weeks for reassessment

## 2014-11-25 NOTE — Progress Notes (Signed)
   Subjective:    Patient ID: Adrian Khan, male    DOB: [birth year], 39 y.o.   MRN: [id number]  HPI Mr. Rowe is a 39 year old male with type 2 diabetes, hypertension, chronic kidney disease stage IV, morbid obesity who presents today for follow-up visit. Please see assessment & plan for documentation of each problem.   Review of Systems  Constitutional: Negative for fever.  Respiratory: Negative for shortness of breath.   Cardiovascular: Negative for chest pain.  Gastrointestinal: Negative for nausea, vomiting, abdominal pain and diarrhea.       Objective:   Physical Exam Constitutional: He is oriented to person, place, and time. Morbidly obese.Sitting in stretcher. Malodorous. HENT:  Head: Normocephalic and atraumatic.  Eyes: Conjunctivae are normal.  Cardiovascular: Normal rate, regular rhythm and normal heart sounds.  Exam reveals no gallop and no friction rub.   No murmur heard. Pulmonary/Chest: Clear to auscultation bilaterally in the anterior lung fields. Abdominal: Soft. Bowel sounds are normal. He exhibits no distension. There is no tenderness.  Neurological: He is alert and oriented to person, place, and time.  Extremities: Feet covered with socks, left foot status post metatarsal amputations. Skin: Skin is warm and dry. He is not diaphoretic.  Psychiatric: His behavior is normal.          Assessment & Plan:

## 2014-11-25 NOTE — Assessment & Plan Note (Signed)
Overview -Mother's interested in knowing if her son would be able to complete physical therapy here in the hospital as an outpatient as the skilled nursing facilities that they are currently looking are at least 45 minutes away from their home.  -Feels that he is able to do much more than just being bedbound.  -Reported that fit this home physical therapist was pleased to see him able to transfer out of bed to wheelchair which she feels that he is able to do anyhow.  Assessment -Unable to assess of his physical deconditioning has improved or not without a prior baseline  Plan -Speak with Ms. Marguarite Arbour (social work) about the options for staying local for physical therapy -Complete FL2 renewal form today

## 2014-11-26 ENCOUNTER — Encounter: Payer: Self-pay | Admitting: Licensed Clinical Social Worker

## 2014-11-26 DIAGNOSIS — E119 Type 2 diabetes mellitus without complications: Secondary | ICD-10-CM | POA: Diagnosis not present

## 2014-11-26 DIAGNOSIS — G8929 Other chronic pain: Secondary | ICD-10-CM | POA: Diagnosis not present

## 2014-11-26 DIAGNOSIS — N189 Chronic kidney disease, unspecified: Secondary | ICD-10-CM | POA: Diagnosis not present

## 2014-11-26 DIAGNOSIS — M25571 Pain in right ankle and joints of right foot: Secondary | ICD-10-CM | POA: Diagnosis not present

## 2014-11-26 DIAGNOSIS — M25512 Pain in left shoulder: Secondary | ICD-10-CM | POA: Diagnosis not present

## 2014-11-26 DIAGNOSIS — I129 Hypertensive chronic kidney disease with stage 1 through stage 4 chronic kidney disease, or unspecified chronic kidney disease: Secondary | ICD-10-CM | POA: Diagnosis not present

## 2014-11-26 DIAGNOSIS — Z794 Long term (current) use of insulin: Secondary | ICD-10-CM | POA: Diagnosis not present

## 2014-11-26 LAB — BASIC METABOLIC PANEL
BUN: 44 mg/dL — AB (ref 6–23)
CHLORIDE: 108 meq/L (ref 96–112)
CO2: 18 mEq/L — ABNORMAL LOW (ref 19–32)
CREATININE: 4.4 mg/dL — AB (ref 0.50–1.35)
Calcium: 6.6 mg/dL — ABNORMAL LOW (ref 8.4–10.5)
GLUCOSE: 189 mg/dL — AB (ref 70–99)
POTASSIUM: 5 meq/L (ref 3.5–5.3)
SODIUM: 138 meq/L (ref 135–145)

## 2014-11-26 LAB — PHOSPHORUS: Phosphorus: 5.5 mg/dL — ABNORMAL HIGH (ref 2.3–4.6)

## 2014-11-26 LAB — GLUCOSE, CAPILLARY: Glucose-Capillary: 181 mg/dL — ABNORMAL HIGH (ref 70–99)

## 2014-11-26 NOTE — Progress Notes (Addendum)
Patient ID: Adrian Khan, male   DOB: 04/20/1976, 39 y.o.   MRN: [id number] [place] is currently working on obtaining SNF placement for [name].  SW in need of FL-2 within 30 days. [name] was seen in office on 11/25/14.  Updated chart information and FL-2 faxed to [facility] per request at [facility] [phone].

## 2014-11-27 DIAGNOSIS — R5381 Other malaise: Secondary | ICD-10-CM | POA: Diagnosis not present

## 2014-11-27 DIAGNOSIS — N184 Chronic kidney disease, stage 4 (severe): Secondary | ICD-10-CM | POA: Diagnosis not present

## 2014-11-27 DIAGNOSIS — I152 Hypertension secondary to endocrine disorders: Secondary | ICD-10-CM | POA: Diagnosis not present

## 2014-11-27 DIAGNOSIS — N189 Chronic kidney disease, unspecified: Secondary | ICD-10-CM | POA: Diagnosis not present

## 2014-11-27 DIAGNOSIS — E0841 Diabetes mellitus due to underlying condition with diabetic mononeuropathy: Secondary | ICD-10-CM | POA: Diagnosis not present

## 2014-11-27 DIAGNOSIS — E1121 Type 2 diabetes mellitus with diabetic nephropathy: Secondary | ICD-10-CM | POA: Diagnosis not present

## 2014-11-27 DIAGNOSIS — L299 Pruritus, unspecified: Secondary | ICD-10-CM | POA: Diagnosis not present

## 2014-11-27 DIAGNOSIS — E118 Type 2 diabetes mellitus with unspecified complications: Secondary | ICD-10-CM | POA: Diagnosis not present

## 2014-11-27 DIAGNOSIS — I10 Essential (primary) hypertension: Secondary | ICD-10-CM | POA: Diagnosis not present

## 2014-11-27 DIAGNOSIS — E119 Type 2 diabetes mellitus without complications: Secondary | ICD-10-CM | POA: Diagnosis not present

## 2014-11-27 DIAGNOSIS — R600 Localized edema: Secondary | ICD-10-CM | POA: Diagnosis not present

## 2014-11-28 DIAGNOSIS — N184 Chronic kidney disease, stage 4 (severe): Secondary | ICD-10-CM | POA: Diagnosis not present

## 2014-11-28 DIAGNOSIS — I10 Essential (primary) hypertension: Secondary | ICD-10-CM | POA: Diagnosis not present

## 2014-11-28 DIAGNOSIS — E118 Type 2 diabetes mellitus with unspecified complications: Secondary | ICD-10-CM | POA: Diagnosis not present

## 2014-11-28 DIAGNOSIS — R600 Localized edema: Secondary | ICD-10-CM | POA: Diagnosis not present

## 2014-11-28 NOTE — Addendum Note (Signed)
Addended by: Riccardo Dubin on: 11/28/2014 01:49 PM   Modules accepted: Orders

## 2014-12-02 DIAGNOSIS — N184 Chronic kidney disease, stage 4 (severe): Secondary | ICD-10-CM | POA: Diagnosis not present

## 2014-12-02 DIAGNOSIS — E119 Type 2 diabetes mellitus without complications: Secondary | ICD-10-CM | POA: Diagnosis not present

## 2014-12-09 DIAGNOSIS — N184 Chronic kidney disease, stage 4 (severe): Secondary | ICD-10-CM | POA: Diagnosis not present

## 2014-12-09 DIAGNOSIS — L299 Pruritus, unspecified: Secondary | ICD-10-CM | POA: Diagnosis not present

## 2014-12-09 DIAGNOSIS — E119 Type 2 diabetes mellitus without complications: Secondary | ICD-10-CM | POA: Diagnosis not present

## 2014-12-13 DIAGNOSIS — E119 Type 2 diabetes mellitus without complications: Secondary | ICD-10-CM | POA: Diagnosis not present

## 2014-12-13 DIAGNOSIS — I10 Essential (primary) hypertension: Secondary | ICD-10-CM | POA: Diagnosis not present

## 2014-12-13 DIAGNOSIS — N184 Chronic kidney disease, stage 4 (severe): Secondary | ICD-10-CM | POA: Diagnosis not present

## 2014-12-23 ENCOUNTER — Encounter: Payer: Self-pay | Admitting: Internal Medicine

## 2014-12-23 ENCOUNTER — Ambulatory Visit (INDEPENDENT_AMBULATORY_CARE_PROVIDER_SITE_OTHER): Payer: Medicare Other | Admitting: Internal Medicine

## 2014-12-23 VITALS — BP 155/77 | HR 98 | Wt >= 6400 oz

## 2014-12-23 DIAGNOSIS — N184 Chronic kidney disease, stage 4 (severe): Secondary | ICD-10-CM

## 2014-12-23 DIAGNOSIS — M79601 Pain in right arm: Secondary | ICD-10-CM | POA: Diagnosis not present

## 2014-12-23 DIAGNOSIS — E118 Type 2 diabetes mellitus with unspecified complications: Secondary | ICD-10-CM

## 2014-12-23 DIAGNOSIS — IMO0002 Reserved for concepts with insufficient information to code with codable children: Secondary | ICD-10-CM

## 2014-12-23 DIAGNOSIS — I10 Essential (primary) hypertension: Secondary | ICD-10-CM

## 2014-12-23 DIAGNOSIS — E1165 Type 2 diabetes mellitus with hyperglycemia: Secondary | ICD-10-CM

## 2014-12-23 DIAGNOSIS — M1990 Unspecified osteoarthritis, unspecified site: Secondary | ICD-10-CM | POA: Diagnosis not present

## 2014-12-23 LAB — BASIC METABOLIC PANEL
ANION GAP: 10 (ref 5–15)
BUN: 56 mg/dL — ABNORMAL HIGH (ref 6–23)
CALCIUM: 6.6 mg/dL — AB (ref 8.4–10.5)
CO2: 20 mmol/L (ref 19–32)
Chloride: 111 mmol/L (ref 96–112)
Creatinine, Ser: 5.19 mg/dL — ABNORMAL HIGH (ref 0.50–1.35)
GFR calc non Af Amer: 13 mL/min — ABNORMAL LOW (ref >90)
GFR, EST AFRICAN AMERICAN: 15 mL/min — AB (ref >90)
Glucose, Bld: 100 mg/dL — ABNORMAL HIGH (ref 70–99)
Potassium: 5.6 mmol/L — ABNORMAL HIGH (ref 3.5–5.1)
SODIUM: 141 mmol/L (ref 135–145)

## 2014-12-23 LAB — GLUCOSE, CAPILLARY: Glucose-Capillary: 93 mg/dL (ref 70–99)

## 2014-12-23 MED ORDER — FUROSEMIDE 40 MG PO TABS: 40.0000 mg | ORAL_TABLET | Freq: Every day | ORAL | Status: DC

## 2014-12-23 NOTE — Assessment & Plan Note (Signed)
on hydralazine 25mg  TID + norvasc 10mg  daily.   BP remains elevated today. will address this later. continue same regimen for now.  asked to eat healthy diet and avoid salt.

## 2014-12-23 NOTE — Patient Instructions (Signed)
Please take lasix 40mg  daily for your water buildup.  Follow up on Friday.  Will check your kidney function today.

## 2014-12-23 NOTE — Progress Notes (Signed)
   Subjective:    Patient ID: Adrian Khan, male    DOB: 16-Aug-1976, 39 y.o.   MRN: [id number]  HPI  39 yo male with CKD Stage IV, uncontrolled HTN and DM II, morbid obesity here with complaint of cough with some sputum production and feeling like he has water build up everywhere in his body. He was recently admitted to SNF for 3 weeks because of his physical deconditioning to improve some of his independence. Was discharged on 12/15/14 without much improvement and feels more distended everywhere for last 1 week.   See problem based a&p.   He states cough has been for 2.5 weeks. Had URI symptoms initiially, now having cough with some sputum production. Denies any SOB at rest, but gets SOB with exertion. Is able to lie down without SOB. Has abdomen tightness and pain around chest and abdomen muscles from coughing per patient. Having good BM. No bleeding. Passing gas. No n/v.  Review of Systems  Constitutional: Positive for chills and fatigue. Negative for fever.  HENT: Positive for congestion. Negative for hearing loss, nosebleeds, sinus pressure, sneezing and sore throat.   Eyes: Negative.   Respiratory: Positive for cough. Negative for chest tightness, shortness of breath and wheezing.   Cardiovascular: Positive for leg swelling. Negative for chest pain and palpitations.  Gastrointestinal: Positive for abdominal pain and abdominal distention. Negative for nausea, vomiting and diarrhea.  Endocrine: Negative.   Genitourinary: Negative for dysuria.  Musculoskeletal: Positive for myalgias.  Skin: Negative.   Neurological: Negative.   Hematological: Negative.   Psychiatric/Behavioral: Negative.        Objective:   Physical Exam  Constitutional: He appears distressed.  Morbidly obese male, in distress. Lying on EMS bed.  HENT:  Head: Normocephalic and atraumatic.  Right Ear: External ear normal.  Left Ear: External ear normal.  Mouth/Throat: No oropharyngeal exudate.  Eyes:  Conjunctivae and EOM are normal. Pupils are equal, round, and reactive to light.  Neck: No JVD present.  Cardiovascular: Normal rate and regular rhythm.  Exam reveals no gallop and no friction rub.   No murmur heard. Pulmonary/Chest: No respiratory distress. He has no wheezes. He exhibits no tenderness.  Abdominal: Soft.  Skin: He is not diaphoretic.          Assessment & Plan:  See problem based a&p.

## 2014-12-23 NOTE — Assessment & Plan Note (Addendum)
  Patient is having volume overload likely from CKD. He had some dietary noncompliance due to being a rehab per patient but not eating more healthy diet prepared at home by his mother.  Lungs sound clear anteriorly but does have some coughing, so could have pulm edema due to renal failure. CXR would not help as it would be unclear due to his morbid obesity.   I doubt he has PE given no hypoxia and no SOB, it's mainly edema he is concerned about.  some of his SOB could be from OHS as well.   I will try treating with lasix 36m daily and have him come back follow up in 4 days.  will check BMET as baseline.   still waiting for nephrology referral appt. he definitely needs to see nephrology as his CKD is progressively worsening.  BMP Latest Ref Rng 12/23/2014 11/25/2014 10/16/2014  Glucose 70 - 99 mg/dL 100(H) 189(H) 228(H)  BUN 6 - 23 mg/dL 56(H) 44(H) 39(H)  Creatinine 0.50 - 1.35 mg/dL 5.19(H) 4.40(H) 4.24(H)  Sodium 135 - 145 mmol/L 141 138 137  Potassium 3.5 - 5.1 mmol/L 5.6(H) 5.0 4.9  Chloride 96 - 112 mmol/L 111 108 111  CO2 19 - 32 mmol/L 20 18(L) 19  Calcium 8.4 - 10.5 mg/dL 6.6(L) 6.6(L) 7.2(L)     Continue bicarb for met acidosis.   BMET shows K+ 5.6. Will discuss with attending about whether to bring him to ED or just re-check on Friday. Lasix shouldhelp too.

## 2014-12-23 NOTE — Assessment & Plan Note (Signed)
on lantus 30 qhs + novolog 10 units with meal time. average sugar 198. running mostly higher than 150's mother is trying to provide better diet.   continue same regimen. will address this further on later visit since he has more acute issue now.  hgba1c has overall improved recently.  Lab Results  Component Value Date   HGBA1C 8.6 11/25/2014

## 2014-12-24 ENCOUNTER — Inpatient Hospital Stay (HOSPITAL_COMMUNITY)
Admission: AD | Admit: 2014-12-24 | Discharge: 2014-12-27 | DRG: 683 | Disposition: A | Discharge status: Home / Self Care | Payer: Medicare Other | Source: Ambulatory Visit | Attending: Infectious Disease | Admitting: Infectious Disease

## 2014-12-24 ENCOUNTER — Encounter (HOSPITAL_COMMUNITY): Payer: Self-pay | Admitting: General Practice

## 2014-12-24 DIAGNOSIS — Z9119 Patient's noncompliance with other medical treatment and regimen: Secondary | ICD-10-CM

## 2014-12-24 DIAGNOSIS — E8770 Fluid overload, unspecified: Secondary | ICD-10-CM | POA: Diagnosis present

## 2014-12-24 DIAGNOSIS — I10 Essential (primary) hypertension: Secondary | ICD-10-CM | POA: Diagnosis present

## 2014-12-24 DIAGNOSIS — M00819 Arthritis due to other bacteria, unspecified shoulder: Secondary | ICD-10-CM | POA: Diagnosis not present

## 2014-12-24 DIAGNOSIS — R5381 Other malaise: Secondary | ICD-10-CM | POA: Diagnosis present

## 2014-12-24 DIAGNOSIS — Z794 Long term (current) use of insulin: Secondary | ICD-10-CM

## 2014-12-24 DIAGNOSIS — E8809 Other disorders of plasma-protein metabolism, not elsewhere classified: Secondary | ICD-10-CM | POA: Diagnosis not present

## 2014-12-24 DIAGNOSIS — Z89432 Acquired absence of left foot: Secondary | ICD-10-CM

## 2014-12-24 DIAGNOSIS — Z6841 Body Mass Index (BMI) 40.0 and over, adult: Secondary | ICD-10-CM | POA: Diagnosis not present

## 2014-12-24 DIAGNOSIS — Z7401 Bed confinement status: Secondary | ICD-10-CM

## 2014-12-24 DIAGNOSIS — N184 Chronic kidney disease, stage 4 (severe): Secondary | ICD-10-CM | POA: Diagnosis not present

## 2014-12-24 DIAGNOSIS — Z882 Allergy status to sulfonamides status: Secondary | ICD-10-CM | POA: Diagnosis not present

## 2014-12-24 DIAGNOSIS — N189 Chronic kidney disease, unspecified: Secondary | ICD-10-CM | POA: Diagnosis not present

## 2014-12-24 DIAGNOSIS — F40232 Fear of other medical care: Secondary | ICD-10-CM

## 2014-12-24 DIAGNOSIS — E875 Hyperkalemia: Secondary | ICD-10-CM | POA: Diagnosis not present

## 2014-12-24 DIAGNOSIS — D631 Anemia in chronic kidney disease: Secondary | ICD-10-CM | POA: Diagnosis present

## 2014-12-24 DIAGNOSIS — F409 Phobic anxiety disorder, unspecified: Secondary | ICD-10-CM | POA: Diagnosis not present

## 2014-12-24 DIAGNOSIS — Z89431 Acquired absence of right foot: Secondary | ICD-10-CM

## 2014-12-24 DIAGNOSIS — G4733 Obstructive sleep apnea (adult) (pediatric): Secondary | ICD-10-CM | POA: Diagnosis not present

## 2014-12-24 DIAGNOSIS — E1122 Type 2 diabetes mellitus with diabetic chronic kidney disease: Secondary | ICD-10-CM | POA: Diagnosis not present

## 2014-12-24 DIAGNOSIS — D509 Iron deficiency anemia, unspecified: Secondary | ICD-10-CM | POA: Diagnosis not present

## 2014-12-24 DIAGNOSIS — Z8711 Personal history of peptic ulcer disease: Secondary | ICD-10-CM | POA: Diagnosis not present

## 2014-12-24 DIAGNOSIS — N289 Disorder of kidney and ureter, unspecified: Secondary | ICD-10-CM | POA: Diagnosis not present

## 2014-12-24 DIAGNOSIS — E1129 Type 2 diabetes mellitus with other diabetic kidney complication: Secondary | ICD-10-CM | POA: Diagnosis present

## 2014-12-24 DIAGNOSIS — N179 Acute kidney failure, unspecified: Secondary | ICD-10-CM | POA: Diagnosis not present

## 2014-12-24 DIAGNOSIS — E1165 Type 2 diabetes mellitus with hyperglycemia: Secondary | ICD-10-CM

## 2014-12-24 DIAGNOSIS — N049 Nephrotic syndrome with unspecified morphologic changes: Secondary | ICD-10-CM | POA: Diagnosis present

## 2014-12-24 DIAGNOSIS — G589 Mononeuropathy, unspecified: Secondary | ICD-10-CM | POA: Diagnosis not present

## 2014-12-24 DIAGNOSIS — E8779 Other fluid overload: Secondary | ICD-10-CM

## 2014-12-24 DIAGNOSIS — D72829 Elevated white blood cell count, unspecified: Secondary | ICD-10-CM | POA: Diagnosis not present

## 2014-12-24 DIAGNOSIS — Z881 Allergy status to other antibiotic agents status: Secondary | ICD-10-CM | POA: Diagnosis not present

## 2014-12-24 DIAGNOSIS — E559 Vitamin D deficiency, unspecified: Secondary | ICD-10-CM | POA: Diagnosis present

## 2014-12-24 DIAGNOSIS — I129 Hypertensive chronic kidney disease with stage 1 through stage 4 chronic kidney disease, or unspecified chronic kidney disease: Secondary | ICD-10-CM | POA: Diagnosis not present

## 2014-12-24 DIAGNOSIS — F418 Other specified anxiety disorders: Secondary | ICD-10-CM | POA: Diagnosis not present

## 2014-12-24 DIAGNOSIS — Z5329 Procedure and treatment not carried out because of patient's decision for other reasons: Secondary | ICD-10-CM | POA: Diagnosis present

## 2014-12-24 DIAGNOSIS — I509 Heart failure, unspecified: Secondary | ICD-10-CM | POA: Diagnosis not present

## 2014-12-24 DIAGNOSIS — N186 End stage renal disease: Secondary | ICD-10-CM | POA: Diagnosis not present

## 2014-12-24 DIAGNOSIS — F419 Anxiety disorder, unspecified: Secondary | ICD-10-CM | POA: Diagnosis not present

## 2014-12-24 DIAGNOSIS — M25519 Pain in unspecified shoulder: Secondary | ICD-10-CM | POA: Insufficient documentation

## 2014-12-24 DIAGNOSIS — I120 Hypertensive chronic kidney disease with stage 5 chronic kidney disease or end stage renal disease: Secondary | ICD-10-CM | POA: Diagnosis not present

## 2014-12-24 DIAGNOSIS — E11319 Type 2 diabetes mellitus with unspecified diabetic retinopathy without macular edema: Secondary | ICD-10-CM | POA: Diagnosis present

## 2014-12-24 DIAGNOSIS — K219 Gastro-esophageal reflux disease without esophagitis: Secondary | ICD-10-CM | POA: Diagnosis present

## 2014-12-24 DIAGNOSIS — R7881 Bacteremia: Secondary | ICD-10-CM | POA: Diagnosis not present

## 2014-12-24 DIAGNOSIS — IMO0002 Reserved for concepts with insufficient information to code with codable children: Secondary | ICD-10-CM | POA: Diagnosis present

## 2014-12-24 DIAGNOSIS — L03119 Cellulitis of unspecified part of limb: Secondary | ICD-10-CM | POA: Diagnosis not present

## 2014-12-24 HISTORY — DX: Personal history of peptic ulcer disease: Z87.11

## 2014-12-24 HISTORY — DX: Personal history of other diseases of the digestive system: Z87.19

## 2014-12-24 HISTORY — DX: Personal history of other medical treatment: Z92.89

## 2014-12-24 HISTORY — DX: Pyogenic arthritis, unspecified: M00.9

## 2014-12-24 HISTORY — DX: Chronic kidney disease, unspecified: N18.9

## 2014-12-24 LAB — GLUCOSE, CAPILLARY
GLUCOSE-CAPILLARY: 131 mg/dL — AB (ref 70–99)
Glucose-Capillary: 139 mg/dL — ABNORMAL HIGH (ref 70–99)

## 2014-12-24 LAB — VITAMIN D 25 HYDROXY (VIT D DEFICIENCY, FRACTURES): Vit D, 25-Hydroxy: <4 ng/mL — ABNORMAL LOW (ref 30–100)

## 2014-12-24 MED ORDER — FUROSEMIDE 80 MG PO TABS
80.0000 mg | ORAL_TABLET | Freq: Two times a day (BID) | ORAL | Status: DC
  Administered 2014-12-24 – 2014-12-27 (×6): 80 mg via ORAL
  Filled 2014-12-24 (×9): qty 1

## 2014-12-24 MED ORDER — INSULIN GLARGINE 300 UNIT/ML ~~LOC~~ SOPN: 30.0000 [IU] | PEN_INJECTOR | Freq: Every day | SUBCUTANEOUS | Status: DC

## 2014-12-24 MED ORDER — CALCIUM ACETATE (PHOS BINDER) 667 MG PO CAPS
1334.0000 mg | ORAL_CAPSULE | Freq: Every day | ORAL | Status: DC
  Administered 2014-12-25 – 2014-12-26 (×2): 1334 mg via ORAL
  Filled 2014-12-24 (×3): qty 2

## 2014-12-24 MED ORDER — ACETAMINOPHEN 325 MG PO TABS
650.0000 mg | ORAL_TABLET | Freq: Four times a day (QID) | ORAL | Status: DC | PRN
Start: 2014-12-24 — End: 2014-12-27

## 2014-12-24 MED ORDER — INSULIN GLARGINE 100 UNIT/ML ~~LOC~~ SOLN
30.0000 [IU] | Freq: Every day | SUBCUTANEOUS | Status: DC
  Filled 2014-12-24 (×2): qty 0.3

## 2014-12-24 MED ORDER — SODIUM BICARBONATE 650 MG PO TABS
1300.0000 mg | ORAL_TABLET | Freq: Three times a day (TID) | ORAL | Status: DC
  Administered 2014-12-24 – 2014-12-27 (×8): 1300 mg via ORAL
  Filled 2014-12-24 (×10): qty 2

## 2014-12-24 MED ORDER — SENNOSIDES-DOCUSATE SODIUM 8.6-50 MG PO TABS: 1.0000 | ORAL_TABLET | Freq: Two times a day (BID) | ORAL | Status: DC | PRN

## 2014-12-24 MED ORDER — SODIUM CHLORIDE 0.9 % IJ SOLN: 3.0000 mL | Freq: Two times a day (BID) | INTRAMUSCULAR | Status: DC

## 2014-12-24 MED ORDER — ACETAMINOPHEN 650 MG RE SUPP: 650.0000 mg | Freq: Four times a day (QID) | RECTAL | Status: DC | PRN

## 2014-12-24 MED ORDER — VITAMIN D (ERGOCALCIFEROL) 1.25 MG (50000 UNIT) PO CAPS: 50000.0000 [IU] | ORAL_CAPSULE | ORAL | Status: DC

## 2014-12-24 MED ORDER — INSULIN ASPART 100 UNIT/ML ~~LOC~~ SOLN
6.0000 [IU] | Freq: Three times a day (TID) | SUBCUTANEOUS | Status: DC
  Administered 2014-12-25: 7 [IU] via SUBCUTANEOUS

## 2014-12-24 MED ORDER — INSULIN ASPART 100 UNIT/ML ~~LOC~~ SOLN
0.0000 [IU] | Freq: Three times a day (TID) | SUBCUTANEOUS | Status: DC
  Administered 2014-12-25: 1 [IU] via SUBCUTANEOUS

## 2014-12-24 MED ORDER — HYDRALAZINE HCL 25 MG PO TABS
25.0000 mg | ORAL_TABLET | Freq: Three times a day (TID) | ORAL | Status: DC
  Administered 2014-12-24 – 2014-12-27 (×8): 25 mg via ORAL
  Filled 2014-12-24 (×10): qty 1

## 2014-12-24 MED ORDER — FLUTICASONE PROPIONATE 50 MCG/ACT NA SUSP
1.0000 | Freq: Every day | NASAL | Status: DC
  Administered 2014-12-25 – 2014-12-27 (×2): 1 via NASAL
  Filled 2014-12-24: qty 16

## 2014-12-24 MED ORDER — AMLODIPINE BESYLATE 10 MG PO TABS
10.0000 mg | ORAL_TABLET | Freq: Every day | ORAL | Status: DC
  Administered 2014-12-25 – 2014-12-27 (×3): 10 mg via ORAL
  Filled 2014-12-24 (×3): qty 1

## 2014-12-24 MED ORDER — VITAMIN D (ERGOCALCIFEROL) 1.25 MG (50000 UNIT) PO CAPS
50000.0000 [IU] | ORAL_CAPSULE | ORAL | Status: DC
  Administered 2014-12-24: 50000 [IU] via ORAL
  Filled 2014-12-24: qty 1

## 2014-12-24 NOTE — Progress Notes (Signed)
Date: 12/24/2014               Patient Name:  Adrian Khan MRN: [id number]  DOB: 1976/02/07 Age / Sex: 39 y.o., male   PCP: Doreen Salvage, MD              Medical Service: Internal Medicine Teaching Service              Attending Physician: Dr. Truman Hayward, MD    First Contact: Dr. Raelene Bott Pager: [phone]  Second Contact: Dr. Heber [place] Pager: [phone]       After Hours (After 5p/  First Contact Pager: [phone]  weekends / holidays): Second Contact Pager: [phone]                                          Chief Complaint:  Hyperkaliemia   HPI: Adrian Khan is a 39 y.o. male with 39 yo male with CKD Stage IV, uncontrolled HTN and DM II, morbid obesity  Who was recently admitted to a SNF for deconditioning and discharged on 12/15/14. Coming today because of elevated potassium of 5.6  and creatinine level of 5.19 , those lab were collected on 12/23/14 during routine visit at the internal medicine clinic. Pt reports that during his stay  at the SNF his diet was not respected that's what explains the worsening of his condition.  Pt understands the risk and reality of his condition but refuses to have an IV access . Pt stated that he has fear of needle starting from his childhood, the only way we could have blood drown and receive IV medication would be if he gets a PICC line. Pt maintains his position after a long talk with the team.   Review of Systems:  Constitutional: Denies fever, chills, diaphoresis, appetite change  HEENT: Denies photophobia, eye pain, redness, hearing loss, ear pain, congestion, sore throat, rhinorrhea, sneezing, mouth sores, trouble swallowing, neck pain, neck stiffness and tinnitus.  Cardiovascular: Denies palpitations and leg swelling.  Gastrointestinal: Denies nausea, vomiting, abdominal pain, diarrhea, constipation, blood in stool and abdominal distention.  Genitourinary: Denies dysuria, urgency, frequency, hematuria,  flank pain and difficulty urinating.  Musculoskeletal: Denies myalgias, back pain, joint swelling, arthralgias and gait problem.  Skin: Denies pallor, rash and wound.  Neurological: Denies dizziness, seizures, syncope, weakness, lightheadedness, numbness and headaches.  Hematological: Denies adenopathy. Easy bruising, personal or family bleeding history  Psychiatric/Behavioral: Denies suicidal ideation, mood changes, confusion, nervousness, sleep disturbance and agitation   Allergies: Sulfonamide derivatives and Rocephin  Medications:  @HMEDSABBREVIATED @  Medical History: Past Medical History  Diagnosis Date  . Shortness of breath     with exertion  . PONV (postoperative nausea and vomiting)   . Anxiety   . Mental disorder   . Depression   . GERD (gastroesophageal reflux disease)   . Constipation   . Neuropathy     feet, hands  . Anginal pain     chest pressure has increased in the past few days; patient denied  on 09/20/12  . Obesity   . Hx MRSA infection   . Hypertension   . Walking pneumonia   . Sleep apnea     "does not wear mask" (12/24/2014)  . 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 Type II diabetes mellitus   . History of blood transfusion     "  related to foot surgery"  . History of stomach ulcers   . Septic arthritis of shoulder, left   . Chronic kidney disease     "here w/kidney problems today" (12/24/2014)    Surgical History: Past Surgical History  Procedure Laterality Date  . I&d extremity  12/22/2011    Procedure: IRRIGATION AND DEBRIDEMENT EXTREMITY;  Surgeon: Sharmon Revere, MD;  Location: [place];  Service: Orthopedics;  Laterality: Left;  . I&d extremity  12/24/2011    Procedure: IRRIGATION AND DEBRIDEMENT EXTREMITY;  Surgeon: Sharmon Revere, MD;  Location: [place];  Service: Orthopedics;  Laterality: Left;  Irrigation and debridement left foot.  . I&d extremity  12/26/2011    Procedure: IRRIGATION AND DEBRIDEMENT  EXTREMITY;  Surgeon: Sharmon Revere, MD;  Location: [place];  Service: Orthopedics;  Laterality: Left;  . I&d extremity  12/28/2011    Procedure: IRRIGATION AND DEBRIDEMENT EXTREMITY;  Surgeon: Sharmon Revere, MD;  Location: [place];  Service: Orthopedics;  Laterality: Left;  I&D Left Foot;Application of  Wound Vac.to dorsal and plantar areas of left foot  . Eye surgery Bilateral     "bleeding behind my eyes"  . Toe surgery Right     Bone removed- 2 toe right  . Wisdom tooth extraction    . Amputation  09/22/2012    Procedure: AMPUTATION FOOT;  Surgeon: Newt Minion, MD;  Location: [place];  Service: Orthopedics;  Laterality: Left;  Left Midfoot Amputation  . Shoulder arthroscopy Left 08/18/2013    Procedure: ARTHROSCOPY SHOULDER and subacromial decompresion and debreidment of abcess;  Surgeon: Newt Minion, MD;  Location: [place];  Service: Orthopedics;  Laterality: Left;  . Amputation Right 09/19/2013    Procedure: AMPUTATION RAY- right;  Surgeon: Newt Minion, MD;  Location: [place];  Service: Orthopedics;  Laterality: Right;  Right Foot 5th Ray Amputation  . Foot fracture surgery Bilateral 2000's    Social History: History   Social History  . Marital Status: Single    Spouse Name: N/A  . Number of Children: N/A  . Years of Education: N/A   Occupational History  . Not on file.   Social History Main Topics  . Smoking status: Never Smoker   . Smokeless tobacco: Never Used  . Alcohol Use: No  . Drug Use: No  . Sexual Activity: Not Currently   Other Topics Concern  . Not on file   Social History Narrative    Family History: History reviewed. No pertinent family history.     Labs/Studies: @VSSPO2RANGES @,  Intake/Output Summary (Last 24 hours) at 12/24/14 1819 Last data filed at 12/24/14 1700  Gross per 24 hour  Intake      0 ml  Output      0 ml  Net      0 ml    Physical Exam: General: WDWN, healthy, cooperative, awake and alert,  No acute distress. Pt very talkative.   Skin: Skin color, texture, turgor normal. No rashes or lesions. HEENT:  Head : Atraumatic, normocephalic. 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  Nose: no nasal congestion.patent, no discharge Throat: No tonsillar erythema, exudates or enlargement. Mouth: Moist mucus, no lesions, dentition good. Neck: supple, no LAD Heart: Pulse regular rate and rhythm, normal S1S2, no murmurs appreciated.  Chest/ Lung: Gynecomasty noted.  Pt is tachypneic .  No tenderness to palpation Clear lungs sounds No rales, rhonchi, wheezing, or rubs on auscultation but examen limited by pt habitus.  Abdomen: Obese. Extremities:  3+ Edema both lower extremities, left toes amputated.  Neuro: CN III-XII grossly intact, sensation intact, spontaneous movement of all limbs, no obvious deficits  Assessment/Plan:    CREED KAIL is a 39 y.o. male with PMHx of CKD Stage IV, uncontrolled HTN and DM II, morbid obesity that presented with hyperkaliemia during routine visit to the internal medicine clinic.   # Hyperkaliemia:  K+=5.6 , pt has known chronic kidney disease stage IV, pt last potassium value on 11/25/14 was  5.0 . Hyperkaliemia is more likely due to a worsening of his chronic kidney disease The ideal would be to give  IV lasix but he is adamantly refusing IV access. - discuss the possibility of placing PICC line  -lasix 80 mg po BID - monitor K and kidney function.   # Volume overload state: pt has generalized edema  and he gained weight as he weight 472 from 460 from last recorded weight. Volume overload more likely due to chronic kidney disease stage IV. Pt morbid obesity makes lung auscultation less reliable but, lower extremities edema are impressive. Again, pt fear of needle makes difficult to pursue further testing and therapy.  - lasix 80 mg po BID -daily weight -Strict I &O   # Hypertension: Patient is on amlodipine 10 mg daily, furosemide 40mg  daily, hydralazine 25 mg 3 times a day at home. Last  recorded BP is 155/77.  -Continue Hydralazine 25mg  3x/day -Continue Amlodipine 10mg  daily.  -  # Type 2 diabetes: Patient's last hemoglobin A1c of 8.6 on February 2016. Home regiment consists of Insuline Lantus 30 units daily at bedtime and NovoLog 10 units 3 times a day before meals. Pt insists that he is compliant with his diabetes medications.  -Continue home insuline Lantus 30 U daily -sliding-scale insuline.    # Morbid obesity: pt current weight is 472 pounds. Pt is very interested to know the type of diet he needs to be on to avoid further health issues.  -consult dietician -PT and OT evaluation.   # Low vitamin D: Ergocalciferol 50,000 units every 2 weeks for 12 weeks  Diet: Carb modified, renal Prophylaxis: SCD. Code: Full   This is a Careers information officer Note.  The care of the patient was discussed with Dr. Raelene Bott and the assessment and plan was formulated with their assistance.  Please see their note for official documentation of the patient encounter.

## 2014-12-24 NOTE — Progress Notes (Signed)
Paged by RN as patient was refusing Lantus. I called his mother and spoke with her by phone. She reports that he had an allergic reaction to that when he was at the SNF. He is currently on Toujeo at home though they do not have this with them at the time as they are aware that you're not supposed bring her medicines in to the hospital. Mother also reports that he has not been eating as well since he is been in hospital given the dietary restrictions and thus is not concerned of hyperglycemia. She reports that even if she had her home medications, she would not give her some insulin given his current CBG of 131 as she feels it would drop too low overnight. I explained to her the risks of letting her his sugars escalate and she acknowledges understanding and acceptance of the consequences of her actions.

## 2014-12-24 NOTE — Progress Notes (Signed)
Internal Medicine Clinic Attending  I saw and evaluated the patient.  I personally confirmed the key portions of the history and exam documented by Dr. Genene Churn and I reviewed pertinent patient test results.  The assessment, diagnosis, and plan were formulated together and I agree with the documentation in the resident's note. Reviewed labs this Am. K 5.6 and Cr up to >5. I am concerned about diuresing pt as outpt bc I do not know if K will normalize with lasix or whether the Cr will worsen with diureses, further driving up the K. I spoke to the mother (was present at yesterday appt). She states he is already talking about not wanting to come to Friday's [facility] FU appt and understands concern and need for inpt observation. 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 She agrees on pt behalf to direct admit. I called Dr Alice Rieger who will arrange for direct admit. Dx Hyperkalemia and renal failure. Dr Tommy Medal attending. Tele bed.

## 2014-12-24 NOTE — Progress Notes (Signed)
New Admission Note:   Arrival Method: Stretcher/EMS Mental Orientation: A&O  Telemetry: Initiated  Assessment: Completed Skin: dry, flaky  IV: none  Pain: none  Tubes: none  Safety Measures: Safety Fall Prevention Plan has been given, discussed and signed Admission: Completed [address] Orientation: Patient has been orientated to the room, unit and staff.  Family: Mother at bedside   MD made aware of pt's arrival to the unit, will come to admit the patient. Will continue to monitor the patient. Call light has been placed within reach and bed alarm has been activated.   Alcide Evener BSN, RN Phone number: [phone]

## 2014-12-24 NOTE — Addendum Note (Signed)
Addended by: Dellia Nims on: 12/24/2014 01:35 PM   Modules accepted: Level of Service

## 2014-12-24 NOTE — H&P (Signed)
Date: 12/24/2014               Patient Name:  Adrian Khan MRN: [id number]  DOB: 10-08-1976 Age / Sex: 39 y.o., male   PCP: Dellia Nims, MD         Medical Service: Internal Medicine Teaching Service         Attending Physician: Dr. Truman Hayward, MD    First Contact: Dr. Raelene Bott Pager: [phone]  Second Contact: Dr. Heber Woodburn Pager: [phone]       After Hours (After 5p/  First Contact Pager: [phone]  weekends / holidays): Second Contact Pager: [phone]   Chief Complaint: hyperkalemia   History of Present Illness:   Patient is a 39 year old with a history of hypertension, diabetes, stage IV chronic kidney disease who presents to the hospital after a lab draw from clinic showing a hyperkalemia of 5.6 and a slightly increased creatinine to 5.2, in addition to some signs of fluid overload. There are some concerns in clinic that diuresis of the patient which may help the hyperkalemia and volume overload may concurrently lead to increased kidney injury. Patient was admitted to the hospital for closer monitoring in the setting of diuresis.  Patient is reporting no complaints to bring him to the hospital. Patient does report that his diet may have not been ideal while he was at the rehabilitation center, and that his diet may have had increased potassium at that point. However, patient states that he has been at home throughout the last week. His mother states that she prepares meals that are low in salt and potassium overall. Patient denies any recent decrease in oral intake or any gastrointestinal symptoms. Patient feels that he is at his baseline and does not understand why his labs are abnormal. Patient only states that he came to the hospital since he was called about the abnormal lab results from his last clinic visit. The patient strongly expresses refusal for any diagnostics or therapeutics that require needles. Patient has a history of chronic kidney disease and has not yet been to  nephrology clinic. It seems like it has been at least 3 months since his outpatient providers have been attempting to get him to the nephrologist.   Meds: Medications Prior to Admission  Medication Sig Dispense Refill  . amLODipine (NORVASC) 10 MG tablet TAKE 1 TABLET (10 MG TOTAL) BY MOUTH DAILY. 30 tablet 0  . B-D ULTRAFINE III SHORT PEN 31G X 8 MM MISC USE AS DIRECTED WITH FLEXPEN 100 each 0  . Blood Glucose Monitoring Suppl (ONE TOUCH ULTRA MINI) W/DEVICE KIT Check blood sugar one time a day 1 each 1  . fluticasone (FLONASE) 50 MCG/ACT nasal spray Place 1 spray into both nostrils daily. 16 g 2  . furosemide (LASIX) 40 MG tablet Take 1 tablet (40 mg total) by mouth daily. 30 tablet 0  . glucose blood (ONE TOUCH ULTRA TEST) test strip Check blood sugar one time a day 100 each 8  . HUMALOG KWIKPEN 100 UNIT/ML KiwkPen Inject 10 Units as directed daily after supper.  0  . hydrALAZINE (APRESOLINE) 25 MG tablet Take 1 tablet (25 mg total) by mouth 3 (three) times daily. 90 tablet 2  . ONETOUCH DELICA LANCETS FINE MISC Check blood sugar one time a day 100 each 5  . senna-docusate (SENOKOT-S) 8.6-50 MG per tablet Take 1 tablet by mouth 2 (two) times daily as needed for constipation.    . sodium bicarbonate 650 MG tablet TAKE  2 TABLETS (1,300 MG TOTAL) BY MOUTH 3 (THREE) TIMES DAILY. 90 tablet 0  . TOUJEO SOLOSTAR 300 UNIT/ML SOPN Inject 30 Units as directed at bedtime.  0  . azithromycin (ZITHROMAX) 500 MG tablet Take 1 tablet (500 mg total) by mouth daily. (Patient not taking: Reported on 12/24/2014) 2 tablet 0  . insulin aspart (NOVOLOG FLEXPEN) 100 UNIT/ML FlexPen Inject 10 Units into the skin daily with supper. (Patient not taking: Reported on 12/24/2014) 15 mL 11  . nystatin (MYCOSTATIN/NYSTOP) 100000 UNIT/GM POWD Apply to affected areas on the lower abdomen/groin area twice daily. Keep skin dry. (Patient not taking: Reported on 12/24/2014) 30 g 0   Current Facility-Administered Medications    Medication Dose Route Frequency Provider Last Rate Last Dose  . acetaminophen (TYLENOL) tablet 650 mg  650 mg Oral Q6H PRN Lucious Groves, DO       Or  . acetaminophen (TYLENOL) suppository 650 mg  650 mg Rectal Q6H PRN Lucious Groves, DO      . [START ON 12/25/2014] amLODipine (NORVASC) tablet 10 mg  10 mg Oral Daily Lucious Groves, DO      . fluticasone (FLONASE) 50 MCG/ACT nasal spray 1 spray  1 spray Each Nare Daily Lucious Groves, DO      . furosemide (LASIX) tablet 80 mg  80 mg Oral BID Lucious Groves, DO      . hydrALAZINE (APRESOLINE) tablet 25 mg  25 mg Oral TID Lucious Groves, DO      . Insulin Glargine SOPN 30 Units  30 Units Injection QHS Lucious Groves, DO      . senna-docusate (Senokot-S) tablet 1 tablet  1 tablet Oral BID PRN Lucious Groves, DO      . sodium bicarbonate tablet 1,300 mg  1,300 mg Oral TID Lucious Groves, DO      . sodium chloride 0.9 % injection 3 mL  3 mL Intravenous Q12H Lucious Groves, DO        Past Medical History  Diagnosis Date  . Shortness of breath     with exertion  . PONV (postoperative nausea and vomiting)   . Anxiety   . Mental disorder   . Depression   . GERD (gastroesophageal reflux disease)   . Constipation   . Neuropathy     feet, hands  . Anginal pain     chest pressure has increased in the past few days; patient denied  on 09/20/12  . Obesity   . Hx MRSA infection   . Hypertension   . Walking pneumonia   . Sleep apnea     "does not wear mask" (12/24/2014)  . 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 Type II diabetes mellitus   . History of blood transfusion     "related to foot surgery"  . History of stomach ulcers   . Septic arthritis of shoulder, left   . Chronic kidney disease     "here w/kidney problems today" (12/24/2014)    Past Surgical History  Procedure Laterality Date  . I&d extremity  12/22/2011    Procedure: IRRIGATION AND DEBRIDEMENT EXTREMITY;  Surgeon: Sharmon Revere, MD;   Location: [place];  Service: Orthopedics;  Laterality: Left;  . I&d extremity  12/24/2011    Procedure: IRRIGATION AND DEBRIDEMENT EXTREMITY;  Surgeon: Sharmon Revere, MD;  Location: [place];  Service: Orthopedics;  Laterality: Left;  Irrigation and debridement left foot.  Marland Kitchen  I&d extremity  12/26/2011    Procedure: IRRIGATION AND DEBRIDEMENT EXTREMITY;  Surgeon: Sharmon Revere, MD;  Location: [place];  Service: Orthopedics;  Laterality: Left;  . I&d extremity  12/28/2011    Procedure: IRRIGATION AND DEBRIDEMENT EXTREMITY;  Surgeon: Sharmon Revere, MD;  Location: [place];  Service: Orthopedics;  Laterality: Left;  I&D Left Foot;Application of  Wound Vac.to dorsal and plantar areas of left foot  . Eye surgery Bilateral     "bleeding behind my eyes"  . Toe surgery Right     Bone removed- 2 toe right  . Wisdom tooth extraction    . Amputation  09/22/2012    Procedure: AMPUTATION FOOT;  Surgeon: Newt Minion, MD;  Location: [place];  Service: Orthopedics;  Laterality: Left;  Left Midfoot Amputation  . Shoulder arthroscopy Left 08/18/2013    Procedure: ARTHROSCOPY SHOULDER and subacromial decompresion and debreidment of abcess;  Surgeon: Newt Minion, MD;  Location: [place];  Service: Orthopedics;  Laterality: Left;  . Amputation Right 09/19/2013    Procedure: AMPUTATION RAY- right;  Surgeon: Newt Minion, MD;  Location: [place];  Service: Orthopedics;  Laterality: Right;  Right Foot 5th Ray Amputation  . Foot fracture surgery Bilateral 2000's    Allergies: Allergies as of 12/24/2014 - Review Complete 12/24/2014  Allergen Reaction Noted  . Sulfonamide derivatives Other (See Comments)   . Rocephin [ceftriaxone] Hives and Itching 10/04/2014    History reviewed. No pertinent family history.  History   Social History  . Marital Status: Single    Spouse Name: N/A  . Number of Children: N/A  . Years of Education: N/A   Occupational History  . Not on file.   Social History Main Topics  . Smoking status:  Never Smoker   . Smokeless tobacco: Never Used  . Alcohol Use: No  . Drug Use: No  . Sexual Activity: Not Currently   Other Topics Concern  . Not on file   Social History Narrative    Review of Systems: All pertinent ROS as stated in HPI.   Physical Exam: Blood pressure 142/82, pulse 95, temperature 98.2 F (36.8 C), temperature source Oral, resp. rate 20, weight 472 lb 10.7 oz (214.4 kg), SpO2 96 %. General: resting in bed, morbidly obese, bedbound, exam overall limited by body habitus HEENT: PERRL, EOMI, no scleral icterus Cardiac: RRR, no rubs, murmurs or gallops Pulm: clear to auscultation bilaterally, moving normal volumes of air Abd: Obese, soft, nontender, nondistended Ext: warm and well perfused, mild edema bilateral lower extremities Neuro: alert and oriented X3, cranial nerves II-XII grossly intact Skin: no rashes or lesions noted Psych: appropriate affect and cognition  Lab results: Basic Metabolic Panel:  Recent Labs  12/23/14 1438  NA 141  K 5.6*  CL 111  CO2 20  GLUCOSE 100*  BUN 56*  CREATININE 5.19*  CALCIUM 6.6*   Liver Function Tests: No results for input(s): AST, ALT, ALKPHOS, BILITOT, PROT, ALBUMIN in the last 72 hours. No results for input(s): LIPASE, AMYLASE in the last 72 hours. No results for input(s): AMMONIA in the last 72 hours. CBC: No results for input(s): WBC, NEUTROABS, HGB, HCT, MCV, PLT in the last 72 hours. Cardiac Enzymes: No results for input(s): CKTOTAL, CKMB, CKMBINDEX, TROPONINI in the last 72 hours. BNP: No results for input(s): PROBNP in the last 72 hours. D-Dimer: No results for input(s): DDIMER in the last 72 hours. CBG:  Recent Labs  12/23/14 1420 12/24/14 1714  GLUCAP  93 139*   Hemoglobin A1C: No results for input(s): HGBA1C in the last 72 hours. Fasting Lipid Panel: No results for input(s): CHOL, HDL, LDLCALC, TRIG, CHOLHDL, LDLDIRECT in the last 72 hours. Thyroid Function Tests: No results for input(s):  TSH, T4TOTAL, FREET4, T3FREE, THYROIDAB in the last 72 hours. Anemia Panel: No results for input(s): VITAMINB12, FOLATE, FERRITIN, TIBC, IRON, RETICCTPCT in the last 72 hours. Coagulation: No results for input(s): LABPROT, INR in the last 72 hours. Urine Drug Screen: Drugs of Abuse     Component Value Date/Time   LABOPIA NONE DETECTED 10/03/2014 1802   COCAINSCRNUR NONE DETECTED 10/03/2014 1802   LABBENZ NONE DETECTED 10/03/2014 1802   AMPHETMU NONE DETECTED 10/03/2014 1802   THCU NONE DETECTED 10/03/2014 1802   LABBARB NONE DETECTED 10/03/2014 1802    Alcohol Level: No results for input(s): ETH in the last 72 hours. Urinalysis: No results for input(s): COLORURINE, LABSPEC, PHURINE, GLUCOSEU, HGBUR, BILIRUBINUR, KETONESUR, PROTEINUR, UROBILINOGEN, NITRITE, LEUKOCYTESUR in the last 72 hours.  Invalid input(s): APPERANCEUR  Imaging results:  No results found.   Assessment & Plan by Problem: Active Problems:   Type II diabetes mellitus with renal manifestations, uncontrolled   Obstructive sleep apnea   Anemia in CKD (chronic kidney disease)   Morbid obesity, BMI unknown   Essential hypertension   Chronic kidney disease (CKD), stage IV (severe)   Physical deconditioning   Hyperkalemia   Vitamin D deficiency   Volume overload   Hypoalbuminemia   Hyperkalemia, diminished renal excretion  Patient is a 39 year old with a history of type 2 diabetes, hypertension, stage IV chronic kidney disease who presents with mild fluid overload, hyperkalemia, slightly increased creatinine.  Hyperkalemia and fluid overload in the setting of stage IV chronic kidney disease: Patient presented to clinic with a potassium of 5.6. Creatinine slightly up to 5.19 from 4.4 in 11/25/2014. Patient with increased weight of 472 from around 460 baseline from prior weights. There is some evidence of edema peripherally although lung exam was limited by body habitus. EKG pending. Patient states that he mostly  adheres to a low potassium diet. Hyperkalemia likely secondary to his acute on chronic stage IV chronic kidney disease. Extensive diagnostics and therapy limited by patient's extreme fear of needles. Please see below for more details. -Lasix 80 mg by mouth twice a day given patient's refusal of IV interventions. -Patient refusing serial lab draws. Patient was extensively counseled on the life-threatening dangers of high potassium and patient continues to refuse.  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 Patient is refusing any lab draws in addition to subcutaneous heparin injection. Patient is requesting that a PICC line be placed, although this is contraindicated in the setting of advanced renal disease. This would also be disproportionately high risk for his current clinical status. Patient was counseled on the importance of further diagnostics and therapy, noting that his hyperkalemia and renal failure could be life-threatening conditions. Patient voiced understanding of his current clinical situation and stated that he would rather die than face these diagnostics and interventions. Patient has had a long history of this fear and has intermittently allowed for lab draws in the past. However, patient seems to be quite stubborn regarding lab draws at this point. We will continue to treat the patient the best way possible without lab draws for now. -Continue to counsel patient on the importance of lab draws. -Encouraged patient to seek therapy as an outpatient to overcome fear. Patient remains resistant  to this idea.  Type 2 diabetes: Patient's last hemoglobin A1c of 8.6 on February 2016. Patient is on Lantus 30 units daily at bedtime and NovoLog 10 units 3 times a day before meals. -Continue with home Lantus 30 units daily. -Sliding-scale insulin  Hypertension: Patient is on hydralazine 25 mg 3 times a day, amlodipine 10 mg daily, and Lasix 40 mg daily. Patient  has been normotensive since his admission. -Continue home regimen.  Morbid obesity: Patient currently at 472 pounds. Body habitus limits diagnostics in multiple ways. -Consult dietician.  -PT consult  Low vitamin D: Ergocalciferol 50,000 units every 2 weeks for 12 weeks.  Diet:  Carb modified, renal Prophylaxis: SCDs as patient is refusing subcutaneous heparin Code: Full  Dispo: Disposition is deferred at this time, awaiting improvement of current medical problems. Anticipated discharge in approximately 3 day(s).   The patient does have a current PCP (Tasrif Ahmed, MD) and does need an [facility] hospital follow-up appointment after discharge.  The patient does have transportation limitations that hinder transportation to clinic appointments.  Signed: Luan Moore, M.D., Ph.D. Internal Medicine Teaching Service, PGY-1 12/24/2014, 6:45 PM

## 2014-12-25 ENCOUNTER — Other Ambulatory Visit: Payer: Self-pay

## 2014-12-25 DIAGNOSIS — I120 Hypertensive chronic kidney disease with stage 5 chronic kidney disease or end stage renal disease: Secondary | ICD-10-CM

## 2014-12-25 DIAGNOSIS — M25519 Pain in unspecified shoulder: Secondary | ICD-10-CM | POA: Insufficient documentation

## 2014-12-25 DIAGNOSIS — M25512 Pain in left shoulder: Secondary | ICD-10-CM

## 2014-12-25 DIAGNOSIS — E876 Hypokalemia: Secondary | ICD-10-CM

## 2014-12-25 DIAGNOSIS — R600 Localized edema: Secondary | ICD-10-CM

## 2014-12-25 DIAGNOSIS — N179 Acute kidney failure, unspecified: Secondary | ICD-10-CM

## 2014-12-25 LAB — GLUCOSE, CAPILLARY
GLUCOSE-CAPILLARY: 121 mg/dL — AB (ref 70–99)
Glucose-Capillary: 127 mg/dL — ABNORMAL HIGH (ref 70–99)
Glucose-Capillary: 111 mg/dL — ABNORMAL HIGH (ref 70–99)
Glucose-Capillary: 129 mg/dL — ABNORMAL HIGH (ref 70–99)

## 2014-12-25 NOTE — Progress Notes (Signed)
Patient refused to cover her blood sugar with our plain novolog insulin here ,telling to this nurse that" i am using a pen flex insulin and my mother will bring it afternoon ".Nurse explained that his blood sugar would shoot up by the time that her mother come back here,in which the patient responded ' I know''.Nurse clarified and offered to cover his noontime blood sugar but the patient still refused.

## 2014-12-25 NOTE — Plan of Care (Signed)
Problem: Food- and Nutrition-Related Knowledge Deficit (NB-1.1) Goal: Nutrition education Formal process to instruct or train a patient/client in a skill or to impart knowledge to help patients/clients voluntarily manage or modify food choices and eating behavior to maintain or improve health. Outcome: Completed/Met Date Met:  12/25/14 RD consulted for renal diet education  Pt was provided with "Food Pyramid for Healthy Eating with Kidney Disease" handout by [email or web address]  Reviewed foods high in potassium, phosphorus and sodium and advised pt to avoid these foods.    Ask pt for diet recall and offered suggestions of foods to including in diet based on current preferences.  Also discussed limiting fluids to 1.2 L/day and gave suggestions on how to keep track of fluid intake.   Expect good compliance.  Mom prepares most meals and she seems very knowledgeable about the renal diet and very concerned for her son's health.  ]  Body mass index is 78.66 kg/(m^2). Pt meets criteria for Obesity Class III based on body mass index.   Pt is currently on the renal diet with 1200 ml fluid restriction and consuming 100%.  Labs and charts reviewed.  No further nutrition interventions at this time.  If further nutrition issues arise, please re-consult RD.   Elmer Picker MS Dietetic Intern Pager Number [phone]

## 2014-12-25 NOTE — H&P (Signed)
  Date: 12/25/2014  Patient name: Adrian Khan  Medical record number: [id number]  Date of birth: [birth year]   I have seen and evaluated Osvaldo Shipper and discussed their care with the Residency Team.   39 year old AA man with whom I am familiar from when I took care of his septic left shoulder.  He suffers from morbid obesity deconditioning only recently able at SNF to stand next to his bed for timed exercises who has DM, HTN, CKD who was admitted directly after having been found in clinic to have had an elevated K to 5.6 and worsening renal fxn with cr up to 5.19.  He refused labs yesterday due to his phobia of needles and phlebotomy. Plan was for IV diuretics but pt again did not want IV placed after one attempt.  We have therefore been giving him aggressive oral doses of lasix.    He complains of dry cough, as well as pain in his left arm chronically.   Exam:  Morbidly obese, oriented HEENT: plethoric face CV: distant heart sounds Pulm: no wheezes heard GI: mx panni  Ext: left Upper arm with edema. Left shoulder is tender to deep pallpation  Neuro: nonfocal    Assessment and Plan: I have seen and evaluated the patient as outlined above. I agree with the formulated Assessment and Plan as detailed in the residents' admission note, with the following changes:   #1 CKD with hyperkalemia and acute on chronic kidney disease, worsening volume overload  ---continue diuretics and gets labs in the am --I had extensive discussion of HD and am worried that he will end up  Needing this for volume and or electrolyte reasons in the near future but he is against it PURELY due to his phobia of needles.   We asked for Nephrology to come and see him in house to explain gravity of situation and explain nature of HD to him better should he eventually need it and be open to idea of having HD He would not necessarily have to have fistula which he fears  (MADE REQUEST IN PARTICULAR GIVEN  DIFFICULTY GETTING PTS TO APPTS. HE HAS REQUIRED AMBULANACE TO GET TO APPTS  I spent greater than 60 minutes with the patient including greater than 50% of time in face to face counsel of the patient and in coordination of their care.  #2 DM: BG have been stable here, perhaps insulin from several days ago not clearing yet  #3 Morbid obesity: agree that he could benefit from bariatric surgery but I am not confident a surgeon would undertake such an operation in his current predacament  #4 Shoulder pain: I doubt he has active infection here at all  Truman Hayward, MD 3/9/20162:54 PM

## 2014-12-25 NOTE — Progress Notes (Signed)
Patient refused to have his lab works drawn.

## 2014-12-25 NOTE — Progress Notes (Signed)
Subjective:  Patient was again counseled on the importance of obtaining daily labs to track the progress for his treatment for high potassium and his renal function. Patient was also further counseled on the moderate likelihood that he will have to start dialysis potentially in the next couple months to years. Patient states that he would be amenable to speaking with a nephrologist.  Otherwise, patient is reporting several chronic complaints this morning. He states that his left arm and left chest have been swollen since a remote operation for a septic shoulder. He also notes some chronic contractures of his left hand.  Objective: Vital signs in last 24 hours: Filed Vitals:   12/24/14 1812 12/24/14 2015 12/24/14 2300 12/25/14 0500  BP: 142/82  140/83 159/85  Pulse: 95  93 94  Temp: 98.2 F (36.8 C)  98.3 F (36.8 C) 98.8 F (37.1 C)  TempSrc: Oral  Oral Oral  Resp: 20  20 18   Height:  5\' 5"  (1.651 m)    Weight: 472 lb 10.7 oz (214.4 kg)     SpO2: 96%  98% 98%   Weight change:   Intake/Output Summary (Last 24 hours) at 12/25/14 0735 Last data filed at 12/25/14 0500  Gross per 24 hour  Intake    240 ml  Output   1325 ml  Net  -1085 ml    General: resting in bed, obese, not in acute distress, exam limited by body habitus HEENT: PERRL, EOMI, no scleral icterus Cardiac: RRR, no rubs, murmurs or gallops Pulm: clear to auscultation bilaterally, moving normal volumes of air Abd: soft, nontender, nondistended, BS present Ext: warm and well perfused, no pedal edema, right foot status post transmetatarsal amputation, no active ulcers or lesions Neuro: alert and oriented X3, cranial nerves II-XII grossly intact, mild contractures in left hand Skin: no rashes or lesions noted Psych: appropriate affect  Lab Results: Basic Metabolic Panel:  Recent Labs Lab 12/23/14 1438  NA 141  K 5.6*  CL 111  CO2 20  GLUCOSE 100*  BUN 56*  CREATININE 5.19*  CALCIUM 6.6*   Liver  Function Tests: No results for input(s): AST, ALT, ALKPHOS, BILITOT, PROT, ALBUMIN in the last 168 hours. No results for input(s): LIPASE, AMYLASE in the last 168 hours. No results for input(s): AMMONIA in the last 168 hours. CBC: No results for input(s): WBC, NEUTROABS, HGB, HCT, MCV, PLT in the last 168 hours. Cardiac Enzymes: No results for input(s): CKTOTAL, CKMB, CKMBINDEX, TROPONINI in the last 168 hours. BNP: No results for input(s): PROBNP in the last 168 hours. D-Dimer: No results for input(s): DDIMER in the last 168 hours. CBG:  Recent Labs Lab 12/23/14 1420 12/24/14 1714 12/24/14 2307  GLUCAP 93 139* 131*   Hemoglobin A1C: No results for input(s): HGBA1C in the last 168 hours. Fasting Lipid Panel: No results for input(s): CHOL, HDL, LDLCALC, TRIG, CHOLHDL, LDLDIRECT in the last 168 hours. Thyroid Function Tests: No results for input(s): TSH, T4TOTAL, FREET4, T3FREE, THYROIDAB in the last 168 hours. Coagulation: No results for input(s): LABPROT, INR in the last 168 hours. Anemia Panel: No results for input(s): VITAMINB12, FOLATE, FERRITIN, TIBC, IRON, RETICCTPCT in the last 168 hours. Urine Drug Screen: Drugs of Abuse     Component Value Date/Time   LABOPIA NONE DETECTED 10/03/2014 1802   COCAINSCRNUR NONE DETECTED 10/03/2014 1802   LABBENZ NONE DETECTED 10/03/2014 1802   AMPHETMU NONE DETECTED 10/03/2014 1802   THCU NONE DETECTED 10/03/2014 1802   LABBARB NONE DETECTED 10/03/2014  1802    Alcohol Level: No results for input(s): ETH in the last 168 hours. Urinalysis: No results for input(s): COLORURINE, LABSPEC, PHURINE, GLUCOSEU, HGBUR, BILIRUBINUR, KETONESUR, PROTEINUR, UROBILINOGEN, NITRITE, LEUKOCYTESUR in the last 168 hours.  Invalid input(s): APPERANCEUR  Micro Results: No results found for this or any previous visit (from the past 240 hour(s)). Studies/Results: No results found. Medications: I have reviewed the patient's current  medications. Scheduled Meds: . amLODipine  10 mg Oral Daily  . calcium acetate  1,334 mg Oral Q breakfast  . fluticasone  1 spray Each Nare Daily  . furosemide  80 mg Oral BID  . hydrALAZINE  25 mg Oral TID  . insulin aspart  0-9 Units Subcutaneous TID WC  . insulin aspart  6 Units Subcutaneous TID WC  . insulin glargine  30 Units Subcutaneous QHS  . sodium bicarbonate  1,300 mg Oral TID  . sodium chloride  3 mL Intravenous Q12H  . Vitamin D (Ergocalciferol)  50,000 Units Oral Q14 Days   Continuous Infusions:  PRN Meds:.acetaminophen **OR** acetaminophen, senna-docusate Assessment/Plan: Active Problems:   Type II diabetes mellitus with renal manifestations, uncontrolled   Obstructive sleep apnea   Anemia in CKD (chronic kidney disease)   Morbid obesity, BMI unknown   Essential hypertension   Chronic kidney disease (CKD), stage IV (severe)   Physical deconditioning   Hyperkalemia   Vitamin D deficiency   Volume overload   Hypoalbuminemia   Hyperkalemia, diminished renal excretion   Patient is a 39 year old with a history of type 2 diabetes, hypertension, stage IV chronic kidney disease who presents with mild fluid overload, hyperkalemia, slightly increased creatinine.  Hyperkalemia and fluid overload in the setting of stage IV chronic kidney disease: Progress with treatment has difficult to assess given patient's refusal of lab draws. Physical exam limited by body habitus. Patient presented to clinic with a potassium of 5.6. Creatinine slightly up to 5.19 from 4.4 in 11/25/2014. Patient with increased weight of 472 from around 460 baseline from prior weights.  -Lasix 80 mg by mouth twice a day given patient's refusal of IV interventions. -Patient refusing serial lab draws. Patient was extensively counseled on the life-threatening dangers of high potassium and patient continues to refuse. -Nephrology consulted for further counseling regarding future plans for dialysis.  Fear of  needles: Patient refusing lab draws and IV lasix. Patient refusing glargine last night. His fear critically compromises the quality of the care available to him.  -Continue to counsel patient on the importance of lab draws. -Encouraged patient to seek therapy as an outpatient to overcome fear. Patient remains resistant to this idea.  Type 2 diabetes: Patient refusing inpatient lab draws. Blood glucoses have remained within a normal range in the low 100s. Patient's last hemoglobin A1c of 8.6 on February 2016. Patient is on Lantus 30 units daily at bedtime and NovoLog 10 units 3 times a day before meals. -Hold home Lantus 30 units daily for now given normoglycemia without Lantus yesterday. -Sliding-scale insulin  Hypertension: Patient has had slightly elevated blood pressures in the 656C to 127N systolic. Patient is on hydralazine 25 mg 3 times a day, amlodipine 10 mg daily, and Lasix 40 mg daily. Patient has been normotensive since his admission. -Continue home regimen.  Morbid obesity: Patient currently at 472 pounds. Body habitus limits diagnostics in multiple ways. -Consult dietician.  -PT consult  Low vitamin D: Ergocalciferol 50,000 units every 2 weeks for 12 weeks. First dose administered on 12/24/14.   Diet: Carb modified,  renal Prophylaxis: SCDs as patient is refusing subcutaneous heparin Code: Full  Dispo: Disposition is deferred at this time, awaiting improvement of current medical problems. Anticipated discharge in approximately 2 day(s).   The patient does have a current PCP (Tasrif Ahmed, MD) and does need an [facility] hospital follow-up appointment after discharge.  The patient does have transportation limitations that hinder transportation to clinic appointments.   LOS: 1 day   Services Needed at time of discharge: Y = Yes, Blank = No PT:   OT:   RN:   Equipment:   Other:    Luan Moore, MD 12/25/2014, 7:35 AM

## 2014-12-25 NOTE — Progress Notes (Signed)
°   12/25/14 0935  Clinical Encounter Type  Visited With Patient;Family;Patient and family together  Visit Type Initial  Referral From Nurse  Consult/Referral To Chaplain  Recommendations Healtcare Directive  Spiritual Encounters  Spiritual Needs Emotional  Stress Factors  Patient Stress Factors Health changes  Family Stress Factors Exhausted;Major life changes  Advance Directives (For Healthcare)  Does patient have an advance directive? No  Would patient like information on creating an advanced directive? Yes - Educational materials given   Responded to request from nurse to visit patient. Patient was alert and patient's mother was in the room. Patient didn't speak much, his mother did most the talking. Patient has decided that he does not want dialysis. Patient's mother has advanced heath care paperwork and I informed her that when she has completed the paperwork to page the Chaplains office and they will be glad to assist her with getting it notarized and placed in the patient's chart. Patient's mother stated that the family has a history of kidney failure and they all passed away as dialysis was not wanted. Patient's mother is at peace with the decision and patient is at peace with his decision. I listened to the concerns of the patient and patient's family, offered support, and explained paperwork. I will pass this along to the Chaplain in charge as the main concern is having the advanced directive in place. I left the room as physical therapist was entering and the doctors were making rounds; to give the patient privacy.  Drue Dun, Chaplain Intern  12/25/2014, 10:19 AM

## 2014-12-25 NOTE — Evaluation (Signed)
Physical Therapy Evaluation Patient Details Name: Adrian Khan MRN: [id number] DOB: [birth year] Today's Date: 12/25/2014   History of Present Illness  Patient is a 39 y/o male with a history of morbid obesity, HTN, DM, stage IV CKD who presents to the hospital after a lab draw from clinic showing hyperkalemia of 5.6 and a slightly increased creatinine to 5.2, in addition to some signs of fluid overload. There are some concerns in clinic that diuresis of the patient which may help the hyperkalemia and volume overload may concurrently lead to increased kidney injury. Patient was admitted to the hospital for closer monitoring in the setting of diuresis. Pt was home for ~1 week from SNF with mother prior to return to the hospital.   Clinical Impression  Pt admitted with above diagnosis. Pt currently with functional limitations due to the deficits listed below (see PT Problem List). At the time of PT eval pt was able to perform transfers with +2 min-mod assist. Pt was unable to attempt OOB transfers at this time due to pain in abdomen and LE's. Per pt and mother, pt was standing with therapy at SNF, and once home was not able to achieve standing anymore. Pt will benefit from skilled PT to increase their independence and safety with mobility to allow discharge to the venue listed below.       Follow Up Recommendations Supervision/Assistance - 24 hour;SNF    Equipment Recommendations  None recommended by PT    Recommendations for Other Services       Precautions / Restrictions Precautions Precautions: Fall Restrictions Weight Bearing Restrictions: No      Mobility  Bed Mobility Overal bed mobility: Needs Assistance;+2 for physical assistance Bed Mobility: Rolling;Sidelying to Sit;Sit to Sidelying Rolling: Min assist Sidelying to sit: Mod assist;+2 for safety/equipment;HOB elevated     Sit to sidelying: Mod assist;+2 for safety/equipment General bed mobility comments: Pt able to assist  with bed mobility using bed rails, however needs assist to complete tasks. For scooting, recliner was placed backwards in front of pt and he used it to pull himself fully to the EOB. Due to pt weight, +2 helpful for safety.   Transfers                 General transfer comment: Pt declined OOB transfers as he was having increased pain in LE's and abdomen while sitting EOB.   Ambulation/Gait             General Gait Details: Unable at this time.   Stairs            Wheelchair Mobility    Modified Rankin (Stroke Patients Only)       Balance Overall balance assessment: Needs assistance Sitting-balance support: Feet unsupported;Bilateral upper extremity supported Sitting balance-Leahy Scale: Poor Sitting balance - Comments: Pt requires UE support to maintain sitting balance. Unable to fully scoot to have feet resting on floor.                                      Pertinent Vitals/Pain Pain Assessment: Faces Faces Pain Scale: Hurts even more Pain Location: Abdomen, LE's Pain Descriptors / Indicators: Discomfort;Grimacing;Guarding Pain Intervention(s): Limited activity within patient's tolerance;Monitored during session;Repositioned    Home Living Family/patient expects to be discharged to:: Private residence Living Arrangements: Parent Available Help at Discharge: Family;Available 24 hours/day Type of Home: House Home Access: Stairs to enter Entrance Stairs-Rails:  Can reach both Entrance Stairs-Number of Steps: 3 Home Layout: Two level;Able to live on main level with bedroom/bathroom Home Equipment: Gilford Rile - 2 wheels;Wheelchair - power;Bedside commode;Hand held shower head      Prior Function Level of Independence: Needs assistance   Gait / Transfers Assistance Needed: Pt reports that he was able to stand at SNF but was not ambulating - maybe a few steps to transfer only.   ADL's / Homemaking Assistance Needed: Staff assisting for ADL's at  SNF        Hand Dominance        Extremity/Trunk Assessment   Upper Extremity Assessment: Defer to OT evaluation           Lower Extremity Assessment: Generalized weakness      Cervical / Trunk Assessment: Normal  Communication   Communication: No difficulties  Cognition Arousal/Alertness: Awake/alert Behavior During Therapy: WFL for tasks assessed/performed Overall Cognitive Status: Within Functional Limits for tasks assessed                      General Comments      Exercises        Assessment/Plan    PT Assessment Patient needs continued PT services  PT Diagnosis Difficulty walking;Acute pain;Generalized weakness   PT Problem List Decreased strength;Decreased range of motion;Decreased activity tolerance;Decreased balance;Decreased mobility;Decreased knowledge of use of DME;Decreased safety awareness;Decreased knowledge of precautions;Pain;Obesity  PT Treatment Interventions DME instruction;Gait training;Stair training;Functional mobility training;Therapeutic activities;Therapeutic exercise;Neuromuscular re-education;Patient/family education   PT Goals (Current goals can be found in the Care Plan section) Acute Rehab PT Goals Patient Stated Goal: Be able to walk PT Goal Formulation: With patient Time For Goal Achievement: 01/08/15 Potential to Achieve Goals: Good    Frequency Min 3X/week   Barriers to discharge        Co-evaluation               End of Session   Activity Tolerance: Patient limited by pain;Patient limited by fatigue Patient left: in bed;with call bell/phone within reach;with bed alarm set Nurse Communication: Mobility status;Need for lift equipment         Time: 3762-8315 PT Time Calculation (min) (ACUTE ONLY): 33 min   Charges:   PT Evaluation $Initial PT Evaluation Tier I: 1 Procedure PT Treatments $Therapeutic Activity: 8-22 mins   PT G Codes:        Rolinda Roan 01/06/15, 2:41 PM  Rolinda Roan,  PT, DPT Acute Rehabilitation Services Pager: [phone]

## 2014-12-25 NOTE — Progress Notes (Signed)
LOS: 1 day   Subjective: Adrian Khan has no acute complaint today.However, Adrian Khan is reporting  multiples chronic health concern notably swelling of Adrian Khan breast and left arm.  Adrian Khan still insisting that Adrian Khan rather die than go Khan Dialysis treatment. Adrian Khan was however contemplating the possibility of a "needle free" dialysis strategy and Adrian Khan was agreeable to talk with a nephrologist.   Adrian Khan is still refusing to have blood drown and have IV access this time even after a long talk with the attending and the all team. Adrian Khan however aggred to have blood drown tomorrow.   Objective: BP 144/94 mmHg  Pulse 92  Temp(Src) 98.8 F (37.1 C) (Oral)  Resp 18  Ht 5\' 5"  (1.651 m)  Wt 214.4 kg (472 lb 10.7 oz)  BMI 78.66 kg/m2  SpO2 96%  Intake/Output Summary (Last 24 hours) at 12/25/14 1152 Last data filed at 12/25/14 1033  Gross per 24 hour  Intake    480 ml  Output   1875 ml  Net  -1395 ml    Physical Exam:   General: WDWN, healthy, cooperative, awake and alert, No acute distress. Adrian Khan very talkative.  Skin: Skin color, texture, turgor normal. No rashes or lesions. HEENT:  Head : Atraumatic, normocephalic. Eye: Vision impaired to left eye.  Nose: no nasal congestion.patent, no discharge Throat: No tonsillar erythema, exudates or enlargement. Mouth: Moist mucus, no lesions, dentition good. Neck: supple, no LAD Heart: Pulse regular rate and rhythm, normal S1S2, no murmurs appreciated.  Chest/ Lung: Gynecomasty noted. .   Tenderness to palpation of left upper chest.  Clear lungs sounds No rales, rhonchi, wheezing, or rubs on auscultation but examen limited by Adrian Khan habitus.  Abdomen: Obese. Extremities: 3+ Edema both lower extremities, left toes amputated, marked swelling of arms L.>> R, tender.   Labs/Studies: I have reviewed labs and studies from last 24hrs per EMR.  Medications: I have reviewed the patient's current medications.  Assessment/Plan: Active Problems:   Type II diabetes mellitus with  renal manifestations, uncontrolled   Obstructive sleep apnea   Anemia in CKD (chronic kidney disease)   Morbid obesity, BMI unknown   Essential hypertension   Chronic kidney disease (CKD), stage IV (severe)   Physical deconditioning   Hyperkalemia   Vitamin D deficiency   Volume overload   Hypoalbuminemia   Hyperkalemia, diminished renal excretion   Adrian Khan is a 39 y.o. male with PMHx of CKD Stage IV, uncontrolled HTN and DM II, morbid obesity who is admitted for hyperkaliemia during routine visit to the internal medicine clinic and volume overload.   # Hyperkaliemiain the setting of chronic kidney disease stage IV.  Adrian Khan diuresed a net output of more than 1 liter , we are suspecting that is Adrian Khan Adrian Khan is still refusing to have blood drown for testing.( K+=5.6 on 12/23/14,) Adrian Khan has known chronic kidney disease stage IV. Adrian Khan have been referred to a nephrologist but Adrian Khan haven't had the opportunity to see them. Adrian Khan has a problem with mobility, Adrian Khan have been bed bound for several months and calls the ambulance to go for Adrian Khan appointments. We will take the opportunity of him being in the hospital to arrange consultation with nephrologist.  -nephrology consult: Adrian Khan was called and Adrian Khan said Adrian Khan will see Adrian Khan. - continue lasix 80 mg po BID - monitor K and kidney function.   # Volume overload state in the setting of chronic kidney disease stage IV: Adrian Khan diuresed more than 1  liter overnight. Adrian Khan today's weight is not recorded. Volume overload more likely due to chronic kidney disease stage IV. Lung sounds are clear but Adrian Khan morbid obesity makes lung auscultation less reliable. Lower and upper  extremities edema are impressive. Again, Adrian Khan fear of needle makes difficult to pursue further testing and therapy.  - lasix 80 mg po BID -daily weight -Strict I &O -fluid restriction 1222ml/24h   # Hypertension: Patient is on amlodipine 10 mg daily, furosemide 40mg  daily,  hydralazine 25 mg 3 times a day at home. Last recorded BP is 144/94.  -Continue Hydralazine 25mg  3x/day -Continue Amlodipine 10mg  daily.    # Type 2 diabetes: Patient's last hemoglobin A1c of 8.6 on February 2016. Home regiment consists of Insuline Toujeo  30 units daily at bedtime and NovoLog 10 units 3 times a day before meals. Adrian Khan insists that Adrian Khan is compliant with Adrian Khan diabetes medications. Adrian Khan did not receive Adrian Khan baseline insuline last night and Adrian Khan CBG have been in the 100's since admission.   -Hold  home baseline insuline. - continue sliding-scale insuline.   # Morbid obesity: Adrian Khan current weight is 472 pounds. Adrian Khan is very interested to know the type of diet Adrian Khan needs to be on to avoid further health issues.  -consult dietician -Adrian Khan and OT evaluation.   # Low vitamin D: Ergocalciferol 50,000 units every 2 weeks for 12 weeks  Diet: Carb modified, renal Prophylaxis: SCD. Code: Full This is a Careers information officer Note.  The care of the patient was discussed with Adrian Khan and the assessment and plan formulated with their assistance.  Please see their attached note for official documentation of the daily encounter.

## 2014-12-25 NOTE — Care Management Note (Signed)
CARE MANAGEMENT NOTE 12/25/2014  Patient:  Adrian Khan, Adrian Khan   Account Number:  [id number]  Date Initiated:  12/25/2014  Documentation initiated by:  Kennie Snedden  Subjective/Objective Assessment:   CM following for progression and d/c planning.     Action/Plan:   Noted CM consult, will arrange DME and HH when orders entered. Noted pt fear of needles and blood draws and plan to decline HD. Will follow of d/c needs.   Anticipated DC Date:  12/27/2014   Anticipated DC Plan:  [place]         Choice offered to / List presented to:             Status of service:  In process, will continue to follow Medicare Important Message given?   (If response is "NO", the following Medicare IM given date fields will be blank) Date Medicare IM given:   Medicare IM given by:   Date Additional Medicare IM given:   Additional Medicare IM given by:    Discharge Disposition:    Per UR Regulation:    If discussed at Long Length of Stay Meetings, dates discussed:    Comments:

## 2014-12-25 NOTE — Consult Note (Signed)
Reason for Consult: Acute renal failure on chronic kidney disease stage IV Referring Physician: Rhina Brackett Dam M.D. 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 Unfortunately, has severe "needle phobia" and refuses labs. He has had previous workup of his chronic kidney disease-done 3 months ago during his hospitalization clinically pointing to diabetic kidney disease versus obesity associated FSGS.   He was admitted to the hospital 2 days ago for worsening renal function, hyperkalemia and volume overload-on 12/23/14 potassium of 5.6, creatinine of 5.2 up from 4.4 one month ago and weight up by about 20 pounds. Unfortunately, since admission he has refused labs that limits tracking of any therapy. Urine output has been fair. He denies any nausea, vomiting or diarrhea and has not had any recent iodinated contrast exposure or nonsteroidal anti-inflammatory drug use.   10/05/2014  10/16/2014  11/25/2014  12/23/2014   BUN 39 (H) 39 (H) 44 (H) 56 (H)  Creatinine 3.97 (H) 4.24 (H) 4.40 (H) 5.19 (H)    Past Medical History  Diagnosis Date  . Shortness of breath     with exertion  . PONV (postoperative nausea and vomiting)   . Anxiety   . Mental disorder   . Depression   . GERD (gastroesophageal reflux disease)   . Constipation   . Neuropathy     feet, hands  . Anginal pain     chest pressure has increased in the past few days; patient denied  on 09/20/12  . Obesity   . Hx MRSA infection   . Hypertension   . Walking pneumonia   . Sleep apnea     "does not wear mask" (12/24/2014)  . 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 Type II diabetes mellitus   . History of blood transfusion     "related to foot surgery"  . History of stomach ulcers   . Septic arthritis of shoulder, left   . Chronic kidney disease     "here w/kidney problems today" (12/24/2014)    Past Surgical History  Procedure Laterality Date  . I&d extremity  12/22/2011    Procedure: IRRIGATION AND DEBRIDEMENT EXTREMITY;  Surgeon: Sharmon Revere, MD;  Location: [place] [facility];  Service: Orthopedics;  Laterality: Left;  . I&d extremity  12/24/2011    Procedure: IRRIGATION AND DEBRIDEMENT EXTREMITY;  Surgeon: Sharmon Revere, MD;  Location: [place] [facility];  Service: Orthopedics;  Laterality: Left;  Irrigation and debridement left foot.  . I&d extremity  12/26/2011    Procedure: IRRIGATION AND DEBRIDEMENT EXTREMITY;  Surgeon: Sharmon Revere, MD;  Location: [facility];  Service: Orthopedics;  Laterality: Left;  . I&d extremity  12/28/2011    Procedure: IRRIGATION AND DEBRIDEMENT EXTREMITY;  Surgeon: Sharmon Revere, MD;  Location: [facility];  Service: Orthopedics;  Laterality: Left;  I&D Left Foot;Application of  Wound Vac.to dorsal and plantar areas of left foot  . Eye surgery Bilateral     "bleeding behind my eyes"  . Toe surgery Right     Bone removed- 2 toe right  . Wisdom tooth extraction    . Amputation  09/22/2012    Procedure: AMPUTATION FOOT;  Surgeon: Newt Minion, MD;  Location: [facility];  Service: Orthopedics;  Laterality: Left;  Left Midfoot Amputation  . Shoulder arthroscopy Left 08/18/2013    Procedure: ARTHROSCOPY SHOULDER and subacromial decompresion and debreidment of abcess;  Surgeon: Newt Minion, MD;  Location: [facility];  Service: Orthopedics;  Laterality: Left;  . Amputation Right 09/19/2013    Procedure: AMPUTATION RAY- right;  Surgeon: Newt Minion, MD;  Location: [place];  Service: Orthopedics;  Laterality: Right;  Right Foot 5th Ray Amputation  . Foot fracture surgery Bilateral 2000's    History reviewed. No pertinent family  history.  Social History:  reports that he has never smoked. He has never used smokeless tobacco. He reports that he does not drink alcohol or use illicit drugs.  Allergies:  Allergies  Allergen Reactions  . Sulfonamide Derivatives Other (See Comments)    cramps  . Rocephin [Ceftriaxone] Hives and Itching    Medications:  Scheduled: . amLODipine  10 mg Oral Daily  . calcium acetate  1,334 mg Oral Q breakfast  . fluticasone  1 spray Each Nare Daily  . furosemide  80 mg Oral BID  . hydrALAZINE  25 mg Oral TID  . insulin aspart  0-9 Units Subcutaneous TID WC  . insulin aspart  6 Units Subcutaneous TID WC  . sodium bicarbonate  1,300 mg Oral TID  . sodium chloride  3 mL Intravenous Q12H  . Vitamin D (Ergocalciferol)  50,000 Units Oral Q14 Days    Results for orders placed or performed during the hospital encounter of 12/24/14 (from the past 48 hour(s))  Glucose, capillary     Status: Abnormal   Collection Time: 12/24/14  5:14 PM  Result Value Ref Range   Glucose-Capillary 139 (H) 70 - 99 mg/dL  Glucose, capillary     Status: Abnormal   Collection Time: 12/24/14 11:07 PM  Result Value Ref Range   Glucose-Capillary 131 (H) 70 - 99 mg/dL  Glucose, capillary     Status: Abnormal   Collection Time: 12/25/14  7:32 AM  Result Value Ref Range   Glucose-Capillary 121 (H) 70 - 99 mg/dL  Glucose, capillary     Status: Abnormal   Collection Time: 12/25/14 11:40 AM  Result Value Ref Range   Glucose-Capillary 127 (H) 70 - 99 mg/dL    No results found.  Review of Systems  Constitutional: Positive for malaise/fatigue. Negative for fever and chills.  Eyes: Positive for blurred vision. Negative for pain and discharge.       Reports chronic problems with vision  Cardiovascular: Positive for chest pain and leg swelling. Negative for palpitations.       Reports left-sided chest pain more towards his shoulder together with localized swelling  Gastrointestinal: Negative.   Genitourinary:  Negative.   Musculoskeletal: Positive for back pain and joint pain.  Skin: Negative.   Neurological: Negative.   Psychiatric/Behavioral: The patient is nervous/anxious.    Blood pressure 144/94, pulse 92, temperature 98.8 F (37.1 C), temperature source Oral, resp. rate 18, height 5\' 5"  (1.651 m), weight 214.4 kg (472 lb 10.7 oz), SpO2 96 %. Physical Exam  Nursing note and vitals reviewed. Constitutional: He is oriented to person, place, and time. He appears well-developed. No distress.  Morbidly obese man  HENT:  Head: Normocephalic and atraumatic.  Nose: Nose normal.  Mouth/Throat: No oropharyngeal exudate.  Eyes: Conjunctivae and EOM are normal. Pupils are equal, round, and reactive to light.  Neck: Normal range of motion. Neck supple. No JVD present.  Cardiovascular: Normal rate, regular rhythm and normal heart sounds.  Exam reveals no friction rub.   No murmur heard. Respiratory: Effort normal. No respiratory distress. He has no wheezes. He has no rales.  GI: Soft. Bowel sounds are normal. He exhibits no distension. There is no tenderness. There is no rebound.  Musculoskeletal: He exhibits edema.  2-3+ lower extremity edema Status post left transmetatarsal amputation, status post amputation of right second toe and fifth toe.  Neurological: He is alert and oriented to person, place, and time.  Skin: Skin is warm and dry.    Assessment/Plan: 1. Acute renal failure on chronic kidney disease stage IV: Suspect that this may be hemodynamically mediated with recent volume excess and what likely is exacerbation of diastolic heart failure. Unfortunately, unable to assess success or lack there of with regards to therapy as he has refused labs (he is willing to have them done tomorrow). I would also check a 24-hour urine for creatinine clearance as his morbid obesity may be causing [place] to underestimate his GFR. With particular reference to his "needle phobia" I detailed to him the procedure of  dialysis that involves a lot of needles as well as surgery that he would have to undergo for access placement. I also belabored to him the efforts required to get him to and from dialysis 3 days a week (I'm not sure if this can be provided on a continuous basis). He is indeed a poor candidate for chronic hemodialysis therapy BUT his age does not preclude it. With regards to his current volume overload-agree with current diuretic therapy. 2. Hyperkalemia: Secondary to acute on chronic renal failure-await labs from tomorrow. 3. Morbid obesity 4. Anxiety/depression: Unfortunately, at this time posing of the area to his provision of care and likely reducing his candidacy for dialysis. 5. Screening for anemia of chronic kidney disease: Check CBC, iron panel and ferritin with tomorrow's labs 6. Screening for metabolic bone disease: Check an intact PTH level and phosphorus level with tomorrow's labs.  Serra Younan K. 12/25/2014, 1:49 PM

## 2014-12-26 ENCOUNTER — Telehealth: Payer: Self-pay | Admitting: Internal Medicine

## 2014-12-26 DIAGNOSIS — N186 End stage renal disease: Secondary | ICD-10-CM

## 2014-12-26 DIAGNOSIS — E1165 Type 2 diabetes mellitus with hyperglycemia: Secondary | ICD-10-CM

## 2014-12-26 DIAGNOSIS — D72829 Elevated white blood cell count, unspecified: Secondary | ICD-10-CM

## 2014-12-26 DIAGNOSIS — D720 Genetic anomalies of leukocytes: Secondary | ICD-10-CM

## 2014-12-26 LAB — CBC
HCT: 24.2 % — ABNORMAL LOW (ref 39.0–52.0)
HEMOGLOBIN: 7.4 g/dL — AB (ref 13.0–17.0)
MCH: 28.1 pg (ref 26.0–34.0)
MCHC: 30.6 g/dL (ref 30.0–36.0)
MCV: 92 fL (ref 78.0–100.0)
Platelets: 343 10*3/uL (ref 150–400)
RBC: 2.63 MIL/uL — ABNORMAL LOW (ref 4.22–5.81)
RDW: 12.9 % (ref 11.5–15.5)
WBC: 17.1 10*3/uL — ABNORMAL HIGH (ref 4.0–10.5)

## 2014-12-26 LAB — IRON AND TIBC
Iron: 22 ug/dL — ABNORMAL LOW (ref 42–165)
Saturation Ratios: 13 % — ABNORMAL LOW (ref 20–55)
TIBC: 169 ug/dL — AB (ref 215–435)
UIBC: 147 ug/dL (ref 125–400)

## 2014-12-26 LAB — GLUCOSE, CAPILLARY
GLUCOSE-CAPILLARY: 138 mg/dL — AB (ref 70–99)
GLUCOSE-CAPILLARY: 148 mg/dL — AB (ref 70–99)
Glucose-Capillary: 137 mg/dL — ABNORMAL HIGH (ref 70–99)
Glucose-Capillary: 93 mg/dL (ref 70–99)

## 2014-12-26 LAB — DIFFERENTIAL
Basophils Absolute: 0 10*3/uL (ref 0.0–0.1)
Basophils Relative: 0 % (ref 0–1)
Eosinophils Absolute: 0.6 10*3/uL (ref 0.0–0.7)
Eosinophils Relative: 4 % (ref 0–5)
Lymphocytes Relative: 7 % — ABNORMAL LOW (ref 12–46)
Lymphs Abs: 1.2 10*3/uL (ref 0.7–4.0)
Monocytes Absolute: 1.2 10*3/uL — ABNORMAL HIGH (ref 0.1–1.0)
Monocytes Relative: 7 % (ref 3–12)
NEUTROS ABS: 13.9 10*3/uL — AB (ref 1.7–7.7)
Neutrophils Relative %: 82 % — ABNORMAL HIGH (ref 43–77)

## 2014-12-26 LAB — RENAL FUNCTION PANEL
ALBUMIN: 2.1 g/dL — AB (ref 3.5–5.2)
Anion gap: 8 (ref 5–15)
BUN: 56 mg/dL — ABNORMAL HIGH (ref 6–23)
CO2: 21 mmol/L (ref 19–32)
CREATININE: 5.05 mg/dL — AB (ref 0.50–1.35)
Calcium: 7 mg/dL — ABNORMAL LOW (ref 8.4–10.5)
Chloride: 111 mmol/L (ref 96–112)
GFR calc non Af Amer: 13 mL/min — ABNORMAL LOW (ref >90)
GFR, EST AFRICAN AMERICAN: 16 mL/min — AB (ref >90)
GLUCOSE: 142 mg/dL — AB (ref 70–99)
Phosphorus: 6.3 mg/dL — ABNORMAL HIGH (ref 2.3–4.6)
Potassium: 5.5 mmol/L — ABNORMAL HIGH (ref 3.5–5.1)
SODIUM: 140 mmol/L (ref 135–145)

## 2014-12-26 LAB — FERRITIN: FERRITIN: 253 ng/mL (ref 22–322)

## 2014-12-26 LAB — TECHNOLOGIST SMEAR REVIEW

## 2014-12-26 MED ORDER — VITAMIN D (ERGOCALCIFEROL) 1.25 MG (50000 UNIT) PO CAPS: 50000.0000 [IU] | ORAL_CAPSULE | ORAL | Status: DC

## 2014-12-26 MED ORDER — SODIUM POLYSTYRENE SULFONATE 15 GM/60ML PO SUSP
30.0000 g | Freq: Once | ORAL | Status: DC
Start: 2014-12-26 — End: 2014-12-26

## 2014-12-26 MED ORDER — INSULIN LISPRO 100 UNIT/ML (KWIKPEN)
0.0000 [IU] | PEN_INJECTOR | Freq: Three times a day (TID) | SUBCUTANEOUS | Status: DC
  Administered 2014-12-26 – 2014-12-27 (×3): 1 [IU] via SUBCUTANEOUS

## 2014-12-26 MED ORDER — CALCIUM ACETATE (PHOS BINDER) 667 MG PO CAPS: 2001.0000 mg | ORAL_CAPSULE | Freq: Every day | ORAL | Status: DC

## 2014-12-26 MED ORDER — SODIUM POLYSTYRENE SULFONATE 15 GM/60ML PO SUSP
60.0000 g | Freq: Once | ORAL | Status: AC
  Administered 2014-12-26: 60 g via ORAL
  Filled 2014-12-26 (×2): qty 240

## 2014-12-26 MED ORDER — INSULIN LISPRO 100 UNIT/ML (KWIKPEN)
6.0000 [IU] | PEN_INJECTOR | Freq: Three times a day (TID) | SUBCUTANEOUS | Status: DC
  Administered 2014-12-26 – 2014-12-27 (×4): 6 [IU] via SUBCUTANEOUS

## 2014-12-26 MED ORDER — CALCIUM ACETATE (PHOS BINDER) 667 MG PO CAPS
2001.0000 mg | ORAL_CAPSULE | Freq: Three times a day (TID) | ORAL | Status: DC
  Administered 2014-12-26 – 2014-12-27 (×3): 2001 mg via ORAL
  Filled 2014-12-26 (×4): qty 3

## 2014-12-26 NOTE — Telephone Encounter (Signed)
Call to patient to confirm appointment for 12/27/14 at 1:15 lmtcb

## 2014-12-26 NOTE — Discharge Summary (Addendum)
Name: Adrian Khan MRN: [id number] DOB: 02-09-1976 39 y.o. PCP: Dellia Nims, MD  Date of Admission: 12/24/2014  4:35 PM Date of Discharge: 12/27/2014 Attending Physician: Truman Hayward, MD  Discharge Diagnosis: Principal Problem:   Hyperkalemia, diminished renal excretion Active Problems:   Type II diabetes mellitus with renal manifestations, uncontrolled   Obstructive sleep apnea   Anemia in CKD (chronic kidney disease)   Morbid obesity, BMI unknown   Essential hypertension   Chronic kidney disease (CKD), stage IV (severe)   Physical deconditioning   Hyperkalemia   Vitamin D deficiency   Volume overload   Hypoalbuminemia   Pain in joint, shoulder region  Discharge Medications:   Medication List    STOP taking these medications        azithromycin 500 MG tablet  Commonly known as:  ZITHROMAX     insulin aspart 100 UNIT/ML FlexPen  Commonly known as:  NOVOLOG FLEXPEN      TAKE these medications        amLODipine 10 MG tablet  Commonly known as:  NORVASC  TAKE 1 TABLET (10 MG TOTAL) BY MOUTH DAILY.     B-D ULTRAFINE III SHORT PEN 31G X 8 MM Misc  Generic drug:  Insulin Pen Needle  USE AS DIRECTED WITH FLEXPEN     calcium acetate 667 MG capsule  Commonly known as:  PHOSLO  Take 3 capsules (2,001 mg total) by mouth 3 (three) times daily with meals.     ferrous gluconate 324 MG tablet  Commonly known as:  FERGON  Take 1 tablet (324 mg total) by mouth 2 (two) times daily with a meal.     fluticasone 50 MCG/ACT nasal spray  Commonly known as:  FLONASE  Place 1 spray into both nostrils daily.     furosemide 80 MG tablet  Commonly known as:  LASIX  Take 1 tablet (80 mg total) by mouth 2 (two) times daily.     glucose blood test strip  Commonly known as:  ONE TOUCH ULTRA TEST  Check blood sugar one time a day     HUMALOG KWIKPEN 100 UNIT/ML KiwkPen  Generic drug:  insulin lispro  Inject 10 Units as directed daily after supper.     hydrALAZINE  25 MG tablet  Commonly known as:  APRESOLINE  Take 1 tablet (25 mg total) by mouth 3 (three) times daily.     nystatin 100000 UNIT/GM Powd  Apply to affected areas on the lower abdomen/groin area twice daily. Keep skin dry.     ONE TOUCH ULTRA MINI W/DEVICE Kit  Check blood sugar one time a day     ONETOUCH DELICA LANCETS FINE Misc  Check blood sugar one time a day     senna-docusate 8.6-50 MG per tablet  Commonly known as:  Senokot-S  Take 1 tablet by mouth 2 (two) times daily as needed for constipation.     sodium bicarbonate 650 MG tablet  TAKE 2 TABLETS (1,300 MG TOTAL) BY MOUTH 3 (THREE) TIMES DAILY.     TOUJEO SOLOSTAR 300 UNIT/ML Sopn  Generic drug:  Insulin Glargine  Inject 10 Units as directed at bedtime.     Vitamin D (Ergocalciferol) 50000 UNITS Caps capsule  Commonly known as:  DRISDOL  Take 1 capsule (50,000 Units total) by mouth every 7 (seven) days.  Start taking on:  12/31/2014        Disposition and follow-up:   Adrian Khan was discharged from [facility] in stable condition.  At the hospital follow up visit please address:  1. Continue fluid restriction, avoid food rich in potassium and phosphorus.   Continue to talk to pt about the need to start Hemodialysis soon. Address needle-phobia.   Consider decrease of lasix dosage as clinically warranted.   Dr. Patel(nephrology) said he would follow patient lab results via fax communication since pt has transportation issues.   Adjustment of basal insulin as it was decreased from 30 to 10 during this admission.   2.  Labs / imaging needed at time of follow-up: Renal panel, CBC  3.  Pending labs/ test needing follow-up: none  Follow-up Appointments: Follow-up Information    Please follow up.   Why:  We will contact your regarding a clinic appointment.       Follow up with Juluis Mire, MD. Daphane Shepherd on 01/02/2015.   Specialty:  Internal Medicine   Why:  2993 AM   Contact  information:   [place] [address] [phone]       Discharge Instructions: Discharge Instructions    Call MD for:  difficulty breathing, headache or visual disturbances    Complete by:  As directed      Call MD for:  extreme fatigue    Complete by:  As directed      Call MD for:  hives    Complete by:  As directed      Call MD for:  persistant dizziness or light-headedness    Complete by:  As directed      Call MD for:  persistant nausea and vomiting    Complete by:  As directed      Call MD for:  redness, tenderness, or signs of infection (pain, swelling, redness, odor or green/yellow discharge around incision site)    Complete by:  As directed      Call MD for:  severe uncontrolled pain    Complete by:  As directed      Call MD for:  temperature >100.4    Complete by:  As directed      Diet - low sodium heart healthy    Complete by:  As directed      Increase activity slowly    Complete by:  As directed            Consultations: Treatment Team:  Elmarie Shiley, MD  Procedures Performed: None  2D Echo: None  Cardiac Cath: none  Admission HPI:  Patient is a 39 year old with a history of hypertension, diabetes, stage IV chronic kidney disease who presents to the hospital after lab drawn from clinic showed a hyperkalemia of 5.6 and a slightly increased creatinine to 5.2, in addition to some signs of fluid overload. There are some concerns in clinic that diuresis of the patient which may help the hyperkalemia and volume overload may concurrently lead to increased kidney injury. Patient was admitted to the hospital for closer monitoring in the setting of diuresis.  Patient is reporting no complaints to bring him to the hospital. Patient does report that his diet may have not been ideal while he was at the rehabilitation center, and that his diet may have had increased potassium at that point. However, patient states that he has been at home throughout the last  week. His mother states that she prepares meals that are low in salt and potassium overall. Patient denies any recent decrease in oral intake or any gastrointestinal symptoms. Patient feels that he  is at his baseline and does not understand why his labs are abnormal. Patient only states that he came to the hospital since he was called about the abnormal lab results from his last clinic visit. The patient strongly expresses refusal for any diagnostics or therapeutics that require needles. Patient has a history of chronic kidney disease and has not yet been to nephrology clinic. It seems like it has been at least 3 months since his outpatient providers have been attempting to get him to the nephrologist.   Hospital Course by problem list: Principal Problem:   Hyperkalemia, diminished renal excretion Active Problems:   Type II diabetes mellitus with renal manifestations, uncontrolled   Obstructive sleep apnea   Anemia in CKD (chronic kidney disease)   Morbid obesity, BMI unknown   Essential hypertension   Chronic kidney disease (CKD), stage IV (severe)   Physical deconditioning   Hyperkalemia   Vitamin D deficiency   Volume overload   Hypoalbuminemia   Pain in joint, shoulder region   Hyperkalemia and fluid overload in the setting of stage IV chronic kidney disease: Patient was admitted to the hospital on 12/24/14 after lab collected on 12/23/14 showed an elevated serum potassium of 5.6 and creatinine of 5.19. Pt has documented CKD stage IV with last estimated GFR value of 15 ml/min. He was admitted to be monitored while getting treated for hyperkaliemia.and volume overload because of concern of worsening of his renal failure. Pt have been difficult to manage refusing peripheral IV access and lab drawn on admission. He was given Lasix 80 mg BID PO with a very limited reduction of his potassium the first two days. Kayexalate was added for 60g X1 on 12/26/14. On 12/27/14 pt K+ came down to a normal  value of 4.9; Creatinine of 5.1. During this admission pt was able to be seen by nephrologist since he haven't been able to do it as an outpatient due to mobility issue. Pt is morbidly obese and deconditioned. He is bed bound and goes to his appointments by ambulance. Pt insists on not undergoing hemodialysis if he gets to that point. He was explained all the risks that he faces including death but he maintained his position. He is in the process of making his mother is POA. After a long talk with him about overcoming his fear of  needle sticks and hemodialysis, he said he would think about revising his position. He may agree to have HD catheter but would not do graft or fistula. He said that he might do it since he would like to be able to go to [place] and fishing again. Plan is to discharge patient on lasix 80 mg BID for a week and possibly reduce the dose to 40 mg BID at follow up visit. Dr. Patel(nephrology) said he would follow patient lab results via fax communication since pt has transportation issues.   Fear of needles: Patient reports that he has an extreme fear of needles stick since his childhood. His fear critically compromises the quality of the care available to him in both the near and long term. Will need to continue to counsel patient on the importance of overcoming his fear so he could have optimal care.   Normocytic anemia: Pt has low Hb of 7.2 on discharge with MCV of 88.4. Pt has low iron , normal ferritin andlow TIBC. Worsening kidney function on a pt with known CKD stage IV and the normocytic nature of his anemia fits the criteria of anemia of chronic  kidney disease. Even though pt's anemia is more likely primary due to renal failure, it is recommended to replace iron reserve before giving Erythropoietin- stimulating agents. Pt to be discharged on Ferrous gluconate 324 mg tab BID.   Morbid obesity: Patient currently at 472 pounds. Pt recommended SNF for the patient but he declined.  Pt will be doing home health physical therapy.  Type 2 diabetes: Blood glucoses have remained within a normal range in the low 100s. Patient's last hemoglobin A1c of 8.6 on February 2016. Patient is on Lantus 30 units daily at bedtime and NovoLog 10 units 3 times a day before meals. Pt CBG remained in the 100's during this admission even though he was not getting his basal insuline therefore he will be discharged on a reduced dose of 10U for the basal insuline.    Hypertension: Patient has had slightly elevated blood pressures in the 388'E to 280'K systolic. Patient is on hydralazine 25 mg 3 times a day, amlodipine 10 mg daily, and Lasix 40 mg daily. Patient has been normotensive since his admission. Will continue home regimen.   Low vitamin D: Ergocalciferol 50,000 units every 2 weeks for 12 weeks. First dose administered on 12/24/14.   Leukocytosis: Pt WBC=15.1 reduced from 17.1 on 12/26/14   Looking back into his chart he has chronic elevated WBC dating from 2009, On his last hospitalization in December 2015 he also had elevated WBC of 17.9 and was treated for possible CAP even though there was no evidence on the CXR (difficult test due to his body habitus). 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 Currently there is no signs of infection.  Discharge Vitals:   BP 151/79 mmHg  Pulse 96  Temp(Src) 98.1 F (36.7 C) (Oral)  Resp 20  Ht _0  (1.651 m)  Wt 472 lb 10.7 oz (214.4 kg)  BMI 78.66 kg/m2  SpO2 98%  Discharge Labs:  Results for orders placed or performed during the hospital encounter of 12/24/14 (from the past 24 hour(s))  Glucose, capillary     Status: None   Collection Time: 12/26/14  4:11 PM  Result Value Ref Range   Glucose-Capillary 93 70 - 99 mg/dL  Glucose, capillary     Status: Abnormal   Collection Time: 12/26/14  9:30 PM  Result Value Ref  Range   Glucose-Capillary 148 (H) 70 - 99 mg/dL  CBC     Status: Abnormal   Collection Time: 12/27/14  5:52 AM  Result Value Ref Range   WBC 15.1 (H) 4.0 - 10.5 K/uL   RBC 2.58 (L) 4.22 - 5.81 MIL/uL   Hemoglobin 7.2 (L) 13.0 - 17.0 g/dL   HCT 22.8 (L) 39.0 - 52.0 %   MCV 88.4 78.0 - 100.0 fL   MCH 27.9 26.0 - 34.0 pg   MCHC 31.6 30.0 - 36.0 g/dL   RDW 12.7 11.5 - 15.5 %   Platelets 360 150 - 400 K/uL  Renal function panel     Status: Abnormal   Collection Time: 12/27/14  5:52 AM  Result Value Ref Range   Sodium 141 135 - 145 mmol/L   Potassium 4.9 3.5 - 5.1 mmol/L   Chloride 110 96 - 112 mmol/L   CO2 19 19 - 32 mmol/L   Glucose, Bld 115 (H) 70 - 99 mg/dL   BUN 55 (H) 6 - 23 mg/dL  Creatinine, Ser 5.10 (H) 0.50 - 1.35 mg/dL   Calcium 7.1 (L) 8.4 - 10.5 mg/dL   Phosphorus 6.3 (H) 2.3 - 4.6 mg/dL   Albumin 2.2 (L) 3.5 - 5.2 g/dL   GFR calc non Af Amer 13 (L) >90 mL/min   GFR calc Af Amer 15 (L) >90 mL/min   Anion gap 12 5 - 15  Glucose, capillary     Status: Abnormal   Collection Time: 12/27/14  7:57 AM  Result Value Ref Range   Glucose-Capillary 122 (H) 70 - 99 mg/dL  Glucose, capillary     Status: Abnormal   Collection Time: 12/27/14 11:41 AM  Result Value Ref Range   Glucose-Capillary 130 (H) 70 - 99 mg/dL    Signed: Luan Moore, MD 12/27/2014, 12:44 PM    Services Ordered on Discharge: [place] Ordered on Discharge: None.

## 2014-12-26 NOTE — Progress Notes (Signed)
Patient ID: Adrian Khan, male   DOB: Jul 07, 1976, 39 y.o.   MRN: [id number]  [facility] Progress Note    Assessment/ Plan:   1. Acute renal failure on chronic kidney disease stage IV: Suspect that this may be hemodynamically mediated with recent volume excess and what likely is exacerbation of diastolic heart failure. His renal function appears to be relatively unchanged since admission-GFR about 16. He continues to have mild hyperkalemia with fair response to furosemide. He has discussed regarding dialysis in detail with his mother and refuses it recognizing well that this would mean his death when he has ESRD-mother filling out forms to be HC POA. There are no dialysis needs at this time and I agree with the current diuretic dose. He has specific transportation requirements that likely will make renal follow-up difficult. Await 24-hour urine collection for creatinine clearance 2. Hyperkalemia: Secondary to acute on chronic renal failure-recommend Kayexalate 60 g 1 dose now 3. Morbid obesity 4. Anxiety/depression 5. Screening for anemia of chronic kidney disease: Low hemoglobin level noted-awaiting iron studies from today 6. Screening for metabolic bone disease: Await intact PTH level. He has elevated phosphorus level of 6.3, not able to adequately follow a low phosphorus diet. Recommend increasing calcium acetate to 3 tablets 3 times a day before meals. Start supplementation with ergocalciferol.  Subjective:   Reports to be feeling fair-wants to go home as he feels that everything that is being done here can be done at home.    Objective:   BP 153/79 mmHg  Pulse 91  Temp(Src) 98 F (36.7 C) (Oral)  Resp 22  Ht 5\' 5"  (1.651 m)  Wt 214.4 kg (472 lb 10.7 oz)  BMI 78.66 kg/m2  SpO2 98%  Intake/Output Summary (Last 24 hours) at 12/26/14 0949 Last data filed at 12/26/14 0600  Gross per 24 hour  Intake   1010 ml  Output   2750 ml  Net  -1740 ml   Weight change:    Physical Exam: Gen: Comfortably resting in bed, mother at bedside CVS: Pulse regular in rate and rhythm Resp: Clear to auscultation, no rales Abd: Soft, obese, nontender Ext: 3+ lymphedema  Imaging: No results found.  Labs: BMET  Recent Labs Lab 12/23/14 1438 12/26/14 0710  NA 141 140  K 5.6* 5.5*  CL 111 111  CO2 20 21  GLUCOSE 100* 142*  BUN 56* 56*  CREATININE 5.19* 5.05*  CALCIUM 6.6* 7.0*  PHOS  --  6.3*   CBC  Recent Labs Lab 12/26/14 0710  WBC 17.1*  HGB 7.4*  HCT 24.2*  MCV 92.0  PLT 343    Medications:    . amLODipine  10 mg Oral Daily  . calcium acetate  1,334 mg Oral Q breakfast  . fluticasone  1 spray Each Nare Daily  . furosemide  80 mg Oral BID  . hydrALAZINE  25 mg Oral TID  . insulin aspart  0-9 Units Subcutaneous TID WC  . insulin aspart  6 Units Subcutaneous TID WC  . sodium bicarbonate  1,300 mg Oral TID  . sodium chloride  3 mL Intravenous Q12H  . [START ON 12/31/2014] Vitamin D (Ergocalciferol)  50,000 Units Oral Q7 days   Elmarie Shiley, MD 12/26/2014, 9:49 AM

## 2014-12-26 NOTE — Progress Notes (Signed)
  Date: 12/26/2014  Patient name: Adrian Khan  Medical record number: [id number]  Date of birth: 1975/11/19   This patient's plan of care was discussed with the house staff. Please see their note for complete details. I concur with their findings.  S: no new complaints  O: AOx3 Morbidly obese Neuro nonfocal  A/P:  #1 Hyperkalemia and acute on chronic renal failure: pts K did not change but we did not have data point for Tuesday or Wed Continue Lasix  Try Kayexalate--note please DO NOT check C DIFF PCRS if pt has diarrhea after kayexlate  #2 ESRD: GREATLY appreciate Dr. Serita Grit help here. Unfortunately I dont think this pt will do well long term as far as his kidney fxn , or his overall health. His mother is also my patient and actually has a constellation of significant medical problems herself. He is filling out paperwork to make her HCPOA  #3 PHobia of needles: he agrees to blood draw tomorrow.  Truman Hayward, MD 12/26/2014, 1:21 PM

## 2014-12-26 NOTE — Progress Notes (Signed)
LOS: 2 days   Subjective: Pt has no complaints, he wants to know when he is going to be discharged.   Pt had a net output of 1.5 liter.  Objective: BP 153/79 mmHg  Pulse 91  Temp(Src) 98 F (36.7 C) (Oral)  Resp 22  Ht 5\' 5"  (1.651 m)  Wt 214.4 kg (472 lb 10.7 oz)  BMI 78.66 kg/m2  SpO2 98%  Intake/Output Summary (Last 24 hours) at 12/26/14 0824 Last data filed at 12/26/14 0600  Gross per 24 hour  Intake   1250 ml  Output   2750 ml  Net  -1500 ml    Physical Exam:   General: WDWN, healthy, cooperative, awake and alert, No acute distress. Pt very talkative but pleasant.  Skin: Skin color, texture, turgor normal. No rashes or lesions. HEENT:  Head : Atraumatic, normocephalic. Eye: Vision impaired to both eyes.  Nose: no nasal congestion.patent, no discharge Throat: No tonsillar erythema, exudates or enlargement. Mouth: Moist mucus, no lesions, dentition good. Neck: supple, no LAD Heart: Pulse regular rate and rhythm, normal S1S2, no murmurs appreciated.  Chest/ Lung: Gynecomasty noted. .  Tenderness to palpation of left upper chest.  Clear lungs sounds No rales, rhonchi, wheezing, or rubs on auscultation but examen limited by pt habitus.  Abdomen: Obese. Extremities: 3+ Edema both lower extremities, left toes amputated, marked swelling of arms L.>> R, tender.   Labs/Studies: I have reviewed labs and studies from last 24hrs per EMR.  Medications: I have reviewed the patient's current medications.  Assessment/Plan: Active Problems:   Type II diabetes mellitus with renal manifestations, uncontrolled   Obstructive sleep apnea   Anemia in CKD (chronic kidney disease)   Morbid obesity, BMI unknown   Essential hypertension   Chronic kidney disease (CKD), stage IV (severe)   Physical deconditioning   Hyperkalemia   Vitamin D deficiency   Volume overload   Hypoalbuminemia   Hyperkalemia, diminished renal excretion   Pain in joint, shoulder  region   Adrian Khan is a 39 y.o. male with PMHx of CKD Stage IV, uncontrolled HTN and DM II, morbid obesity who is admitted for hyperkaliemia during routine visit to the internal medicine clinic and volume overload.   # Hyperkaliemiain the setting of chronic kidney disease stage IV. Pt diuresed a net output of 1.5  liter , Minimal improvement of Kaliemia to 5.5 from 5.6 even though I suspect that his admission potassium may have been more elevated since the last collection was on 12/23/14 (prior to admission) due to pt refusing blood drown on admission.  Pt has known chronic kidney disease stage IV. Pt have been referred to a nephrologist but he haven't had the opportunity to see them. He has a problem with mobility, he have been bed bound for several months and calls the ambulance to go for his appointments. We have taken the opportunity of him being in the hospital to arrange consultation with nephrologist. Dr Posey Pronto saw the pt yesterday and he agrreed with diuresis.  - appreciate nephrology consult. -Give Kayexalate 60g once.  - continue lasix 80 mg po BID - monitor K and kidney function.  # Volume overload state in the setting of chronic kidney disease stage IV: Pt diuresed 1.5 liter overnight. Pt today's weight is not recorded. Volume overload more likely due to chronic kidney disease stage IV. Lung sounds are clear but pt morbid obesity makes lung auscultation less reliable. Lower and upper extremities edema are persisitent but slightly improved.   -  lasix 80 mg po BID -daily weight -Strict I &O -fluid restriction 1200 ml/24h  # Hyperleukocytosis: Pt WBC=17.1. Looking into his chart he has chronic elevated WBC dating from 2009, On his last hospitalization in December 2015  he also had elevated WBC of 17.9 and was treated for possible CAP even though  there was no evidence on the CXR( difficult test due to his body habitus) . 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 Currently there is no signs of infection.   -Monitor for signs of infection  # Normocytic anemia:  Pt has low Hb of 7.4 with MCV of 92 .Pt . Worsening kidney function on a pt with known CKD stage IV and the normocytic nature of his anemia pointe to anemia of chronic kidney disease . Ferritin and iron panel  ordered and results are pending.   -Anemia panel pending -monitor Hb and signs of anemia decompensation.   # Hypertension: Patient is on amlodipine 10 mg daily, furosemide 40 mg daily, hydralazine 25 mg 3 times a day at home.  BP has been in the 130's to 174'B for the Systolic and 44'H to 67'R for the diastolic. Last recorded BP is 153/96.  -Continue Hydralazine 25 mg 3x/day -Continue Amlodipine 10 mg daily.    # Type 2 diabetes: Patient's last hemoglobin A1c of 8.6 on February 2016. Home regiment consists of Insuline Toujeo 30 units daily at bedtime and NovoLog 10 units 3 times a day before meals. Pt insists that he is compliant with his diabetes medications. Pt did not receive his baseline insuline last night and his CBG have been in the 100's since admission.   -Hold home baseline insuline. - continue sliding-scale insuline.   # Morbid obesity: pt current weight is 472 pounds an is Height is 5'5" ( BMI= 78.54) Pt is very interested to know the type of diet he needs to be on to avoid further health issues. He had a dietician consult yesterday. PT recommended Supervision/ assistance-24h/ SNF( [place]).  -carb modified/renal diet.  -PT and OT while in the hospital and at [facility].  # Low vitamin D /Hypocalcemia / hyperphosphatemia ; this values are consistent with renal failure. Poor vitamin D and/or calcium intake is less likely. Pt has known CKD. Stage IV.   - Ergocalciferol 50,000 units every 1 weeks for 12 weeks. -calcium acetate PO - consider  checking PTH level.  Diet: Carb modified, renal Prophylaxis: SCD. Code: Full This is a Careers information officer Note.  The care of the patient was discussed with Dr. Raelene Bott and the assessment and plan formulated with their assistance.  Please see their attached note for official documentation of the daily encounter.

## 2014-12-26 NOTE — Progress Notes (Signed)
Subjective:  Patient is reporting that his left arm and chest wall pain are better today. He is overall in good spirits. He is not reporting any other complaints. He states that he would be willing to do one more lab draw tomorrow morning.   Objective: Vital signs in last 24 hours: Filed Vitals:   12/25/14 0500 12/25/14 1000 12/25/14 1724 12/26/14 0514  BP: 159/85 144/94 132/86 153/79  Pulse: 94 92 88 91  Temp: 98.8 F (37.1 C) 98.8 F (37.1 C) 98.6 F (37 C) 98 F (36.7 C)  TempSrc: Oral Oral Oral Oral  Resp: _0 Height:      Weight:      SpO2: 98% 96% 98% 98%   Weight change:   Intake/Output Summary (Last 24 hours) at 12/26/14 0827 Last data filed at 12/26/14 0600  Gross per 24 hour  Intake   1250 ml  Output   2750 ml  Net  -1500 ml    General: resting in bed, obese, not in acute distress, exam limited by body habitus HEENT: PERRL, EOMI, no scleral icterus Cardiac: RRR, no rubs, murmurs or gallops Pulm: clear to auscultation bilaterally, moving normal volumes of air Abd: soft, nontender, nondistended, BS present Ext: warm and well perfused, no pedal edema, right foot status post transmetatarsal amputation, no active ulcers or lesions Neuro: alert and oriented X3, cranial nerves II-XII grossly intact, mild contractures in left hand Skin: no rashes or lesions noted Psych: appropriate affect  Lab Results: Basic Metabolic Panel:  Recent Labs Lab 12/23/14 1438  NA 141  K 5.6*  CL 111  CO2 20  GLUCOSE 100*  BUN 56*  CREATININE 5.19*  CALCIUM 6.6*   Liver Function Tests: No results for input(s): AST, ALT, ALKPHOS, BILITOT, PROT, ALBUMIN in the last 168 hours. No results for input(s): LIPASE, AMYLASE in the last 168 hours. No results for input(s): AMMONIA in the last 168 hours. CBC: No results for input(s): WBC, NEUTROABS, HGB, HCT, MCV, PLT in the last 168 hours. Cardiac Enzymes: No results for input(s): CKTOTAL, CKMB, CKMBINDEX, TROPONINI in the  last 168 hours. BNP: No results for input(s): PROBNP in the last 168 hours. D-Dimer: No results for input(s): DDIMER in the last 168 hours. CBG:  Recent Labs Lab 12/24/14 2307 12/25/14 0732 12/25/14 1140 12/25/14 1643 12/25/14 2117 12/26/14 0737  GLUCAP 131* 121* 127* 111* 129* 138*   Hemoglobin A1C: No results for input(s): HGBA1C in the last 168 hours. Fasting Lipid Panel: No results for input(s): CHOL, HDL, LDLCALC, TRIG, CHOLHDL, LDLDIRECT in the last 168 hours. Thyroid Function Tests: No results for input(s): TSH, T4TOTAL, FREET4, T3FREE, THYROIDAB in the last 168 hours. Coagulation: No results for input(s): LABPROT, INR in the last 168 hours. Anemia Panel: No results for input(s): VITAMINB12, FOLATE, FERRITIN, TIBC, IRON, RETICCTPCT in the last 168 hours. Urine Drug Screen: Drugs of Abuse     Component Value Date/Time   LABOPIA NONE DETECTED 10/03/2014 1802   COCAINSCRNUR NONE DETECTED 10/03/2014 1802   LABBENZ NONE DETECTED 10/03/2014 1802   AMPHETMU NONE DETECTED 10/03/2014 1802   THCU NONE DETECTED 10/03/2014 1802   LABBARB NONE DETECTED 10/03/2014 1802    Alcohol Level: No results for input(s): ETH in the last 168 hours. Urinalysis: No results for input(s): COLORURINE, LABSPEC, PHURINE, GLUCOSEU, HGBUR, BILIRUBINUR, KETONESUR, PROTEINUR, UROBILINOGEN, NITRITE, LEUKOCYTESUR in the last 168 hours.  Invalid input(s): APPERANCEUR  Micro Results: No results found for this or any previous visit (from the  past 240 hour(s)). Studies/Results: No results found. Medications: I have reviewed the patient's current medications. Scheduled Meds: . amLODipine  10 mg Oral Daily  . calcium acetate  1,334 mg Oral Q breakfast  . fluticasone  1 spray Each Nare Daily  . furosemide  80 mg Oral BID  . hydrALAZINE  25 mg Oral TID  . insulin aspart  0-9 Units Subcutaneous TID WC  . insulin aspart  6 Units Subcutaneous TID WC  . sodium bicarbonate  1,300 mg Oral TID  . sodium  chloride  3 mL Intravenous Q12H  . [START ON 12/31/2014] Vitamin D (Ergocalciferol)  50,000 Units Oral Q7 days   Continuous Infusions:  PRN Meds:.acetaminophen **OR** acetaminophen, senna-docusate Assessment/Plan: Active Problems:   Type II diabetes mellitus with renal manifestations, uncontrolled   Obstructive sleep apnea   Anemia in CKD (chronic kidney disease)   Morbid obesity, BMI unknown   Essential hypertension   Chronic kidney disease (CKD), stage IV (severe)   Physical deconditioning   Hyperkalemia   Vitamin D deficiency   Volume overload   Hypoalbuminemia   Hyperkalemia, diminished renal excretion   Pain in joint, shoulder region   Patient is a 39 year old with a history of type 2 diabetes, hypertension, stage IV chronic kidney disease who presents with mild fluid overload, hyperkalemia, slightly increased creatinine.  Hyperkalemia and fluid overload in the setting of stage IV chronic kidney disease: Patient has agreed to lab draws this morning and his potasium is down very slightly to 5.5 from 5.6. Creatinine slightly down to 5.05 from  5.19 in clinic. No concerning EKG changes from yesterday. Patient with increased weight of 472 from around 460 baseline from prior weights.  -Lasix 80 mg by mouth twice a day given patient's refusal of IV interventions. -Kayexylate 30 g once to help with dropping potassium. -Patient amenable to one more lab draw tomorrow morning. -Appreciate nephrology consultation and counseling. -Phoslo for hyperphosphatemia of 6.3.  -Iron and ferritin pending though hemoglobin low at 7.4.   Fear of needles: Patient allowing for one time lab draw for now though still declining other interventions. His fear critically compromises the quality of the care available to him in both the near and long term.  -Continue to counsel patient on the importance of lab draws.  Long term planning in anticipation of dialysis: Patients has several limiting factors in the  establishment of long-term hemodialysis which may be expected in the future given his decline in renal function. Patient has a severe fear of needles as noted above. Additionally, due to morbid obesity, patient needs an ambulance to be transported to any clinic appointments or future hemodialysis sessions.  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 Patient has previously been treated for severe infections which may account for some of these elevations. However, patient does not currently clinically appear to be infected. However, it has been a neutrophilic predominance in the more short-term since December 2015.  -Consider ESR for evaluation of inflammation or infection. -Smear evaluation  -Will would continue to monitor for any signs of active infection  Morbid obesity: Patient currently at 472 pounds. Body habitus limits diagnostics in multiple ways. -Consult dietician.  -PT recommending SNF placement  Type 2 diabetes: Blood glucoses have remained within a normal range in the low 100s. Patient's last hemoglobin A1c of 8.6 on February 2016. Patient is on Lantus 30 units daily at bedtime and NovoLog 10  units 3 times a day before meals. -Hold home Lantus 30 units daily for now given normoglycemia.  -Sliding-scale insulin  Hypertension: Patient has had slightly elevated blood pressures in the 102I to 902M systolic. Patient is on hydralazine 25 mg 3 times a day, amlodipine 10 mg daily, and Lasix 40 mg daily. Patient has been normotensive since his admission. -Continue home regimen.  Low vitamin D: Ergocalciferol 50,000 units every 2 weeks for 12 weeks. First dose administered on 12/24/14.   Diet: Carb modified, renal Prophylaxis: SCDs as patient is refusing subcutaneous heparin Code: Full  Dispo: Disposition is deferred at this time, awaiting improvement of current medical problems. Anticipated  discharge in approximately 2 day(s).   The patient does have a current PCP (Tasrif Ahmed, MD) and does need an [facility] hospital follow-up appointment after discharge.  The patient does have transportation limitations that hinder transportation to clinic appointments.   LOS: 2 days   Services Needed at time of discharge: Y = Yes, Blank = No PT:   OT:   RN:   Equipment:   Other:    Luan Moore, MD 12/26/2014, 8:27 AM

## 2014-12-27 ENCOUNTER — Ambulatory Visit: Payer: Medicare Other | Admitting: Internal Medicine

## 2014-12-27 DIAGNOSIS — D509 Iron deficiency anemia, unspecified: Secondary | ICD-10-CM

## 2014-12-27 DIAGNOSIS — E119 Type 2 diabetes mellitus without complications: Secondary | ICD-10-CM

## 2014-12-27 DIAGNOSIS — F40298 Other specified phobia: Secondary | ICD-10-CM

## 2014-12-27 LAB — RENAL FUNCTION PANEL
ALBUMIN: 2.2 g/dL — AB (ref 3.5–5.2)
Anion gap: 12 (ref 5–15)
BUN: 55 mg/dL — ABNORMAL HIGH (ref 6–23)
CALCIUM: 7.1 mg/dL — AB (ref 8.4–10.5)
CO2: 19 mmol/L (ref 19–32)
CREATININE: 5.1 mg/dL — AB (ref 0.50–1.35)
Chloride: 110 mmol/L (ref 96–112)
GFR calc Af Amer: 15 mL/min — ABNORMAL LOW (ref >90)
GFR calc non Af Amer: 13 mL/min — ABNORMAL LOW (ref >90)
Glucose, Bld: 115 mg/dL — ABNORMAL HIGH (ref 70–99)
Phosphorus: 6.3 mg/dL — ABNORMAL HIGH (ref 2.3–4.6)
Potassium: 4.9 mmol/L (ref 3.5–5.1)
Sodium: 141 mmol/L (ref 135–145)

## 2014-12-27 LAB — HEPATITIS C ANTIBODY: HCV AB: NEGATIVE

## 2014-12-27 LAB — GLUCOSE, CAPILLARY
Glucose-Capillary: 122 mg/dL — ABNORMAL HIGH (ref 70–99)
Glucose-Capillary: 130 mg/dL — ABNORMAL HIGH (ref 70–99)

## 2014-12-27 LAB — CBC
HCT: 22.8 % — ABNORMAL LOW (ref 39.0–52.0)
Hemoglobin: 7.2 g/dL — ABNORMAL LOW (ref 13.0–17.0)
MCH: 27.9 pg (ref 26.0–34.0)
MCHC: 31.6 g/dL (ref 30.0–36.0)
MCV: 88.4 fL (ref 78.0–100.0)
PLATELETS: 360 10*3/uL (ref 150–400)
RBC: 2.58 MIL/uL — AB (ref 4.22–5.81)
RDW: 12.7 % (ref 11.5–15.5)
WBC: 15.1 10*3/uL — ABNORMAL HIGH (ref 4.0–10.5)

## 2014-12-27 LAB — CREATININE CLEARANCE, URINE, 24 HOUR
COLLECTION INTERVAL-CRCL: 24 h
CREATININE, URINE: 48.36 mg/dL
Creatinine Clearance: 23 mL/min — ABNORMAL LOW (ref 75–125)
Creatinine, 24H Ur: 1693 mg/d (ref 800–2000)
URINE TOTAL VOLUME-CRCL: 3500 mL

## 2014-12-27 LAB — HIV ANTIBODY (ROUTINE TESTING W REFLEX): HIV Screen 4th Generation wRfx: NONREACTIVE

## 2014-12-27 MED ORDER — FUROSEMIDE 80 MG PO TABS: 80.0000 mg | ORAL_TABLET | Freq: Two times a day (BID) | ORAL | Status: DC

## 2014-12-27 MED ORDER — FERROUS GLUCONATE 324 (38 FE) MG PO TABS: 324.0000 mg | ORAL_TABLET | Freq: Two times a day (BID) | ORAL | Status: DC

## 2014-12-27 MED ORDER — TOUJEO SOLOSTAR 300 UNIT/ML ~~LOC~~ SOPN: 10.0000 [IU] | PEN_INJECTOR | Freq: Every day | SUBCUTANEOUS | Status: DC

## 2014-12-27 MED ORDER — VITAMIN D (ERGOCALCIFEROL) 1.25 MG (50000 UNIT) PO CAPS: 50000.0000 [IU] | ORAL_CAPSULE | ORAL | Status: DC

## 2014-12-27 MED ORDER — FERROUS GLUCONATE 324 (38 FE) MG PO TABS
324.0000 mg | ORAL_TABLET | Freq: Two times a day (BID) | ORAL | Status: DC
  Filled 2014-12-27 (×2): qty 1

## 2014-12-27 MED ORDER — CALCIUM ACETATE (PHOS BINDER) 667 MG PO CAPS: 2001.0000 mg | ORAL_CAPSULE | Freq: Three times a day (TID) | ORAL | Status: DC

## 2014-12-27 NOTE — Progress Notes (Addendum)
Physical Therapy Treatment Patient Details Name: Adrian Khan MRN: [id number] DOB: 02-17-1976 Today's Date: 12/27/2014    History of Present Illness Patient is a 39 y/o male with a history of morbid obesity, HTN, DM, stage IV CKD who presents to the hospital after a lab draw from clinic showing hyperkalemia of 5.6 and a slightly increased creatinine to 5.2, in addition to some signs of fluid overload. There are some concerns in clinic that diuresis of the patient which may help the hyperkalemia and volume overload may concurrently lead to increased kidney injury. Patient was admitted to the hospital for closer monitoring in the setting of diuresis. Pt was home for ~1 week from SNF with mother prior to return to the hospital.     PT Comments    Pt progressing slowly towards physical therapy goals. Pt continues to decline OOB due to abdominal pain, however was able to sit EOB for ~10 minutes. During this time pt was able to achieve short bouts of unsupported sitting, yet most of the time pt required at least 1 UE support. Pt anticipates d/c home this afternoon. Will need ambulance transfer and HHPT to follow.   Follow Up Recommendations  Supervision/Assistance - 24 hour;SNF     Equipment Recommendations  None recommended by PT    Recommendations for Other Services       Precautions / Restrictions Precautions Precautions: Fall Restrictions Weight Bearing Restrictions: No    Mobility  Bed Mobility Overal bed mobility: Needs Assistance;+2 for physical assistance Bed Mobility: Sit to Sidelying;Sidelying to Sit   Sidelying to sit: +2 for safety/equipment;HOB elevated;Min assist     Sit to sidelying: +2 for safety/equipment;Mod assist General bed mobility comments: Pt able to assist with bed mobility using bed rails, however needs assist to complete tasks. For scooting, recliner was placed backwards in front of pt and he used it to pull himself fully to the EOB. Due to pt weight, +2  helpful for safety.   Transfers                 General transfer comment: Pt declined OOB transfers as he was having increased pain in LE's and abdomen while sitting EOB.   Ambulation/Gait             General Gait Details: Unable at this time.    Stairs            Wheelchair Mobility    Modified Rankin (Stroke Patients Only)       Balance Overall balance assessment: Needs assistance Sitting-balance support: Feet unsupported;Bilateral upper extremity supported Sitting balance-Leahy Scale: Poor Sitting balance - Comments: Pt requires UE support to maintain sitting balance. Unable to fully scoot to have feet resting on floor.                             Cognition Arousal/Alertness: Awake/alert Behavior During Therapy: WFL for tasks assessed/performed Overall Cognitive Status: Within Functional Limits for tasks assessed                      Exercises      General Comments        Pertinent Vitals/Pain Pain Assessment: Faces Faces Pain Scale: Hurts little more Pain Location: Abdomen, LE's Pain Descriptors / Indicators: Discomfort;Grimacing;Guarding Pain Intervention(s): Limited activity within patient's tolerance;Monitored during session;Repositioned    Home Living  Prior Function            PT Goals (current goals can now be found in the care plan section) Acute Rehab PT Goals Patient Stated Goal: Be able to walk PT Goal Formulation: With patient Time For Goal Achievement: 01/08/15 Potential to Achieve Goals: Good Progress towards PT goals: Progressing toward goals    Frequency  Min 2X/week    PT Plan Current plan remains appropriate    Co-evaluation             End of Session   Activity Tolerance: Patient limited by pain;Patient limited by fatigue Patient left: in bed;with call bell/phone within reach;with bed alarm set     Time: 1000-1020 PT Time Calculation (min) (ACUTE  ONLY): 20 min  Charges:  $Therapeutic Activity: 8-22 mins                    G Codes:      Rolinda Roan January 26, 2015, 1:14 PM  Rolinda Roan, PT, DPT Acute Rehabilitation Services Pager: [phone]

## 2014-12-27 NOTE — Progress Notes (Signed)
Patient Discharge: Disposition: Patient discharged home. Education: Reviewed medications, prescriptions, diet, activity, follow-up appointments, and discharge instructions.  Gave back his medications from pharmacy. IV: Discontinued before discharge. Telemetry: Discontinued before discharge, CCMD notified. Transportation: Transported via EMS. Belongings: Patient took all his belongings with him.

## 2014-12-27 NOTE — Clinical Social Work Psychosocial (Addendum)
Clinical Social Work Department BRIEF PSYCHOSOCIAL ASSESSMENT 12/27/2014  Patient:  Adrian Khan, Adrian Khan     Account Number:  [id number]     Admit date:  12/24/2014  Clinical Social Worker:  Frederico Hamman  Date/Time:  12/27/2014 01:23 AM  Referred by:  RN  Date Referred:  12/27/2014 Referred for  SNF Placement   Other Referral:   Interview type:  Patient Other interview type:   CSW also talked with patient's mother, Raheim Beutler who was at the bedside.    PSYCHOSOCIAL DATA Living Status:  PARENTS Admitted from facility:   Level of care:   Primary support name:  Venita Lick Primary support relationship to patient:  PARENT Degree of support available:   Ms. Chill is very supportive and involved with patient and his care. She and patient appear to have a very close relationship.    CURRENT CONCERNS Current Concerns  Post-Acute Placement   Other Concerns:    SOCIAL WORK ASSESSMENT / PLAN CSW received consult from PT as they evaluated patient and recommended. CSW talked with patient and Ms. Jenean Lindau regarding short-term rehab. Mr. Scheidt immediately began to talk with CSW about his previous stay at [facility] and that the food was not good and staff provided him with take out menus to order food. Patient has also been to [facility] and the therapy staff did not work with him until the 2nd week of his stay there and he ended up leaving [place].    Patient is alert, oriented, engaged, talkative and interacted appropriately with CSW and nurse case manager who was in the room briefly during the conversation. CSW informed that patient has an aide that comes 5 days a week for about an hour and 45 minutes each day to assist patient with his bath.   Assessment/plan status:  No Further Intervention Required Other assessment/ plan:   Information/referral to community resources:   None needed or requested at this time.    PATIENT'S/FAMILY'S RESPONSE TO PLAN OF CARE: Mr. Lebleu very  engaged with CSW and responded clearly that he wants to go home at discharge. His mother was in agreement.         Steffie Waggoner Givens, MSW, LCSW Licensed Clinical Social Worker [place] [phone]

## 2014-12-27 NOTE — Progress Notes (Signed)
LOS: 3 days   Subjective: Pt complaining of nausea started yesterday after receiving Kayexalate, he report two Bowel movements. He declines anti nausea medicine and he is eating breakfast at this time. Diuresed a net of almost 2 liters.  Objective: BP 129/56 mmHg  Pulse 89  Temp(Src) 98.4 F (36.9 C) (Oral)  Resp 20  Ht 5\' 5"  (1.651 m)  Wt 214.4 kg (472 lb 10.7 oz)  BMI 78.66 kg/m2  SpO2 99%  Intake/Output Summary (Last 24 hours) at 12/27/14 0829 Last data filed at 12/27/14 0500  Gross per 24 hour  Intake    960 ml  Output   2500 ml  Net  -1540 ml    Physical Exam:   General: WDWN, healthy, cooperative, awake and alert, No acute distress. Pt very talkative but pleasant.  Skin: Skin color, texture, turgor normal. No rashes or lesions. HEENT:  Head : Atraumatic, normocephalic. Eye: Vision impaired to both eyes.  Nose: no nasal congestion.patent, no discharge Throat: No tonsillar erythema, exudates or enlargement. Mouth: Moist mucus, no lesions, dentition good. Neck: supple, no LAD Heart: Pulse regular rate and rhythm, normal S1S2, no murmurs appreciated.  Chest/ Lung: Gynecomasty noted. .  No tenderness to palpation. Clear lungs sounds, no rales, rhonchi, wheezing, or rubs on auscultation but examen limited by pt habitus.  Abdomen: Obese. Extremities: 3+ Edema both lower extremities, left toes amputated, marked swelling of arms. Labs/Studies: I have reviewed labs and studies from last 24hrs per EMR.  Medications: I have reviewed the patient's current medications.  Assessment/Plan: Principal Problem:   Hyperkalemia, diminished renal excretion Active Problems:   Type II diabetes mellitus with renal manifestations, uncontrolled   Obstructive sleep apnea   Anemia in CKD (chronic kidney disease)   Morbid obesity, BMI unknown   Essential hypertension   Chronic kidney disease (CKD), stage IV (severe)   Physical deconditioning   Hyperkalemia   Vitamin D  deficiency   Volume overload   Hypoalbuminemia   Pain in joint, shoulder region   Adrian Khan is a 39 y.o. male with PMHx of CKD Stage IV, uncontrolled HTN and DM II, morbid obesity who is admitted for hyperkaliemia during routine visit to the internal medicine clinic and volume overload.   # Hyperkaliemia and volume overload in the setting of chronic kidney disease stage IV. Pt on lasix 80 mg BID, since admission. He received Kayexalate 60 g po X 1 yesterday.  Pt diuresed a net output of 2 liter overnight, there is improvement of Kaliemia to 4.9  from 5.5 yesterday . Creatinine almost unchanged to 5.1 from 5.05. 24h-urine creatinin clearance shows a better GFR of 92ml/ min compared from the estimated GFR.  Generalized edema has regressed.      - discharge pt today with f/u with PCP and nephrology.  - appreciate nephrology consult..  - continue lasix 80 mg po BID - monitor K and kidney function. -fluid restriction 1262ml/24h  # Normocytic anemia: Pt has low Hb of 7.2 today reduced from 7.4 yesterday  with MCV of 88.4. Pt has low iron , normal  ferritin and  Low TIBC . Worsening kidney function on a pt with known CKD stage IV and the normocytic nature of his anemia fit the criteria of  anemia of chronic kidney disease. Even though pt's anemia is more likely primary due to renal failure, It is recommended to replace iron reserve before giving Erythropoietin- stimulating agents.( ESA therapy would not be effective otherwise)     -  Ferrous gluconate 324 mg tab BID. -monitor Hb and signs of anemia decompensation, during follow up visit.    # Hypertension: Patient is on amlodipine 10 mg daily, furosemide 40 mg daily, hydralazine 25 mg 3 times a day at home. BP has been in the 120's to 742'V for the Systolic and 95'G to 38'V for the diastolic. Last recorded BP is 151/79.  -Continue Hydralazine 25 mg 3x/day -Continue Amlodipine 10 mg daily.    # Type 2 diabetes: Patient's last  hemoglobin A1c of 8.6 on February 2016. Home regiment consists of Insuline Toujeo 30 units daily at bedtime and NovoLog 10 units 3 times a day before meals. Pt has not received his baseline insuline since admission his CBG remains in the 100's.   -Hold home baseline insuline, pt to be discharged on reduced dose of basal insulin of 10 u at bed time.  - continue sliding-scale insuline.   # Morbid obesity: pt current weight is 472 pounds an is Height is 5'5" ( BMI= 78.54) . He had a dietician consult this admission. PT recommended Supervision/ assistance-24h/ SNF( [facility]).  -carb modified/renal diet.  -PT and OT while in the hospital and at discharge.  # Low vitamin D /Hypocalcemia / hyperphosphatemia ; this values are consistent with renal failure. Poor vitamin D and/or calcium intake is less likely. Pt has known CKD. Stage IV.   - Ergocalciferol 50,000 units every 1 weeks for 12 weeks. -calcium acetate PO - consider checking PTH level. -low phosphorus diet.  # Hyperleukocytosis: Pt WBC=15.1, reduced from 17.1 yesterday.  Looking back into his chart he has chronic elevated WBC dating from 2009, On his last hospitalization in December 2015 he also had elevated WBC of 17.9 and was treated for possible CAP even though there was no evidence on the CXR( difficult test due to his body habitus) . 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 Currently there is no signs of infection.   -Monitor for signs of infection.  Diet: Carb modified, renal Prophylaxis: SCDs as patient is refusing subcutaneous heparin. Code: Full  Dispo: More likely to be discharge today with follow up renal function and CBC as outpatient.   This is a Careers information officer Note.  The care of the patient was discussed with Dr. Raelene Bott and the assessment and plan formulated with their  assistance.  Please see their attached note for official documentation of the daily encounter.

## 2014-12-27 NOTE — Progress Notes (Addendum)
Patient ID: Adrian Khan, male   DOB: 1976-08-13, 39 y.o.   MRN: [id number]  [facility] Progress Note    Assessment/ Plan:   1. 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 Agree with continuing him on current diuretic dose. He has specific transportation requirements that likely will make renal follow-up difficult. His 24 hour urine creatinine clearance was 23 mL/minute (better than his estimated GFR). Very difficult predicament-he has several barriers to transportation to medical care as well as profound needle phobia and has discussed with his mother and resolved not to undertake dialysis even if it means his mortality. 2. Hyperkalemia: Corrected with diuretic therapy/Kayexalate-reminded of the importance of low potassium diet 3. Morbid obesity 4. Anxiety/depression 5. Screening for anemia of chronic kidney disease: With low iron stores but inability to deliver intravenous iron-start oral iron therapy. Need to replace this prior to considering ESA therapy as efficacy would be limited. 6. Screening for metabolic bone disease: Discussed low phosphorus diet and the importance of compliance with calcium acetate for phosphorus binding. Follow-up in PTH levels  Given his difficulties with transportation-I will not schedule him for follow-up visits at the renal clinic but I will be happy to manage this patient in conjunction with the internal medicine teaching service. Kindly fax over concerning labs (attention to me) whenever you get them so that I can assist in his ongoing care. I have discussed this plan with both him and his mother-they are agreeable and recognize that transportation is indeed a difficulty.  Subjective:   No acute events overnight    Objective:   BP 151/79 mmHg  Pulse 96  Temp(Src) 98.1 F (36.7 C) (Oral)   Resp 20  Ht 5\' 5"  (1.651 m)  Wt 214.4 kg (472 lb 10.7 oz)  BMI 78.66 kg/m2  SpO2 98%  Intake/Output Summary (Last 24 hours) at 12/27/14 1013 Last data filed at 12/27/14 5284  Gross per 24 hour  Intake    720 ml  Output   2950 ml  Net  -2230 ml   Weight change:   Physical Exam: Gen: Comfortably resting in bed-very jovial today CVS: Pulse regular in rate and rhythm Resp: Distant breath sounds bilaterally Abd: Soft, obese, nontender Ext: Lymphedema with 2+ pitting edema  Imaging: No results found.  Labs: BMET  Recent Labs Lab 12/23/14 1438 12/26/14 0710 12/27/14 0552  NA 141 140 141  K 5.6* 5.5* 4.9  CL 111 111 110  CO2 20 21 19   GLUCOSE 100* 142* 115*  BUN 56* 56* 55*  CREATININE 5.19* 5.05* 5.10*  CALCIUM 6.6* 7.0* 7.1*  PHOS  --  6.3* 6.3*   CBC  Recent Labs Lab 12/26/14 0710 12/27/14 0552  WBC 17.1* 15.1*  NEUTROABS 13.9*  --   HGB 7.4* 7.2*  HCT 24.2* 22.8*  MCV 92.0 88.4  PLT 343 360    Medications:    . amLODipine  10 mg Oral Daily  . calcium acetate  2,001 mg Oral TID WC  . fluticasone  1 spray Each Nare Daily  . furosemide  80 mg Oral BID  . hydrALAZINE  25 mg Oral TID  . insulin lispro  0-9 Units Subcutaneous TID WC  . insulin lispro  6 Units Subcutaneous TID WC  . sodium bicarbonate  1,300 mg Oral TID  . sodium chloride  3 mL Intravenous Q12H  . [START ON 12/31/2014]  Vitamin D (Ergocalciferol)  50,000 Units Oral Q7 days   Elmarie Shiley, MD 12/27/2014, 10:13 AM

## 2014-12-27 NOTE — Progress Notes (Signed)
Chaplain visited with Adrian Khan and his mother.   Both pt and mother very energetic, humorous and conversational .   Chaplain completed AD with pt and handed copy to secretary to be placed in chart.   Pt and mother have several copies of the document.   Delford Field, [place] 12/27/2014 2:06 PM

## 2014-12-27 NOTE — Plan of Care (Signed)
Problem: Discharge Progression Outcomes Goal: Activity appropriate for discharge plan Outcome: Completed/Met Date Met:  12/27/14 At baseline

## 2014-12-27 NOTE — Care Management Note (Signed)
CARE MANAGEMENT NOTE 12/27/2014  Patient:  Adrian Khan, Adrian Khan   Account Number:  [id number]  Date Initiated:  12/25/2014  Documentation initiated by:  Jesseka Drinkard  Subjective/Objective Assessment:   CM following for progression and d/c planning.     Action/Plan:   Noted CM consult, will arrange DME and HH when orders entered. Noted pt fear of needles and blood draws and plan to decline HD. Will follow of d/c needs.  Transfer board ordered.   Anticipated DC Date:  12/27/2014   Anticipated DC Plan:  [place]         Choice offered to / List presented to:          [facility] arranged  HH-1 RN  [place] agency  [facility]   Status of service:  Completed, signed off Medicare Important Message given?  YES (If response is "NO", the following Medicare IM given date fields will be blank) Date Medicare IM given:  12/27/2014 Medicare IM given by:  Pauline Pegues Date Additional Medicare IM given:   Additional Medicare IM given by:    Discharge Disposition:  [place]  Per UR Regulation:    If discussed at Long Length of Stay Meetings, dates discussed:    Comments:  12/26/2014 Continue to follow, will still need orders for [facility] services and DME.   CRoyal RN MPH, case manager, [phone]

## 2014-12-27 NOTE — Progress Notes (Signed)
Subjective:  Patient states that he is feeling well today. I had a long and detailed discussion regarding my concerns for his long-term prognosis. I did update him on the status of his acute disease which is largely resolved. However, I counseled the patient on the importance that he gained encouraging strength to overcome his fear of needles as well as his focus on his diet in terms of turning around his long-term prognosis. Patient states agreement with my counseling. Patient states that he would like to one day be able to go back to church and go fishing again.  Objective: Vital signs in last 24 hours: Filed Vitals:   12/26/14 1000 12/26/14 1735 12/26/14 2100 12/27/14 0500  BP: 153/96 142/88 139/72 129/56  Pulse: 91 96 95 89  Temp: 97.1 F (36.2 C) 98 F (36.7 C) 97.4 F (36.3 C) 98.4 F (36.9 C)  TempSrc: Oral Oral Oral Oral  Resp: 20 22 20 20   Height:      Weight:      SpO2: 98% 98% 98% 99%   Weight change:   Intake/Output Summary (Last 24 hours) at 12/27/14 0643 Last data filed at 12/27/14 0500  Gross per 24 hour  Intake    960 ml  Output   2950 ml  Net  -1990 ml    General: resting in bed, obese, not in acute distress, exam limited by body habitus HEENT: PERRL, EOMI, no scleral icterus Cardiac: RRR, no rubs, murmurs or gallops Pulm: clear to auscultation bilaterally, moving normal volumes of air Abd: soft, nontender, nondistended, BS present Ext: warm and well perfused, no pedal edema, right foot status post transmetatarsal amputation, no active ulcers or lesions Neuro: alert and oriented X3, cranial nerves II-XII grossly intact, mild contractures in left hand Skin: no rashes or lesions noted Psych: appropriate affect  Lab Results: Basic Metabolic Panel:  Recent Labs Lab 12/23/14 1438 12/26/14 0710  NA 141 140  K 5.6* 5.5*  CL 111 111  CO2 20 21  GLUCOSE 100* 142*  BUN 56* 56*  CREATININE 5.19* 5.05*  CALCIUM 6.6* 7.0*  PHOS  --  6.3*   Liver  Function Tests:  Recent Labs Lab 12/26/14 0710  ALBUMIN 2.1*   No results for input(s): LIPASE, AMYLASE in the last 168 hours. No results for input(s): AMMONIA in the last 168 hours. CBC:  Recent Labs Lab 12/26/14 0710  WBC 17.1*  NEUTROABS 13.9*  HGB 7.4*  HCT 24.2*  MCV 92.0  PLT 343   Cardiac Enzymes: No results for input(s): CKTOTAL, CKMB, CKMBINDEX, TROPONINI in the last 168 hours. BNP: No results for input(s): PROBNP in the last 168 hours. D-Dimer: No results for input(s): DDIMER in the last 168 hours. CBG:  Recent Labs Lab 12/25/14 1643 12/25/14 2117 12/26/14 0737 12/26/14 1117 12/26/14 1611 12/26/14 2130  GLUCAP 111* 129* 138* 137* 93 148*   Hemoglobin A1C: No results for input(s): HGBA1C in the last 168 hours. Fasting Lipid Panel: No results for input(s): CHOL, HDL, LDLCALC, TRIG, CHOLHDL, LDLDIRECT in the last 168 hours. Thyroid Function Tests: No results for input(s): TSH, T4TOTAL, FREET4, T3FREE, THYROIDAB in the last 168 hours. Coagulation: No results for input(s): LABPROT, INR in the last 168 hours. Anemia Panel:  Recent Labs Lab 12/26/14 0710  FERRITIN 253  TIBC 169*  IRON 22*   Urine Drug Screen: Drugs of Abuse     Component Value Date/Time   LABOPIA NONE DETECTED 10/03/2014 1802   COCAINSCRNUR NONE DETECTED 10/03/2014 1802  LABBENZ NONE DETECTED 10/03/2014 1802   AMPHETMU NONE DETECTED 10/03/2014 1802   THCU NONE DETECTED 10/03/2014 1802   LABBARB NONE DETECTED 10/03/2014 1802    Alcohol Level: No results for input(s): ETH in the last 168 hours. Urinalysis: No results for input(s): COLORURINE, LABSPEC, PHURINE, GLUCOSEU, HGBUR, BILIRUBINUR, KETONESUR, PROTEINUR, UROBILINOGEN, NITRITE, LEUKOCYTESUR in the last 168 hours.  Invalid input(s): APPERANCEUR  Micro Results: No results found for this or any previous visit (from the past 240 hour(s)). Studies/Results: No results found. Medications: I have reviewed the patient's  current medications. Scheduled Meds: . amLODipine  10 mg Oral Daily  . calcium acetate  2,001 mg Oral TID WC  . fluticasone  1 spray Each Nare Daily  . furosemide  80 mg Oral BID  . hydrALAZINE  25 mg Oral TID  . insulin lispro  0-9 Units Subcutaneous TID WC  . insulin lispro  6 Units Subcutaneous TID WC  . sodium bicarbonate  1,300 mg Oral TID  . sodium chloride  3 mL Intravenous Q12H  . [START ON 12/31/2014] Vitamin D (Ergocalciferol)  50,000 Units Oral Q7 days   Continuous Infusions:  PRN Meds:.acetaminophen **OR** acetaminophen, senna-docusate Assessment/Plan: Principal Problem:   Hyperkalemia, diminished renal excretion Active Problems:   Type II diabetes mellitus with renal manifestations, uncontrolled   Obstructive sleep apnea   Anemia in CKD (chronic kidney disease)   Morbid obesity, BMI unknown   Essential hypertension   Chronic kidney disease (CKD), stage IV (severe)   Physical deconditioning   Hyperkalemia   Vitamin D deficiency   Volume overload   Hypoalbuminemia   Pain in joint, shoulder region   Patient is a 39 year old with a history of type 2 diabetes, hypertension, stage IV chronic kidney disease who presents with mild fluid overload, hyperkalemia, slightly increased creatinine.  Hyperkalemia and fluid overload in the setting of stage IV chronic kidney disease: Potassium has resolved to normal levels at 4.9 today. Patient's creatinine remained stable at 5.1. Urine creatinine clearance exhibiting a slightly better GFR of 23 mL/m compared to estimated. Hemoglobin is 7.2 with iron studies consistent with anemia of chronic kidney disease. Patient with increased weight of 472 from around 460 baseline from prior weights.  -Lasix 80 mg by mouth twice a day given patient's refusal of IV interventions. -Appreciate nephrology consultation and counseling. -Phoslo for hyperphosphatemia. -Start iron supplementation to replete iron stores before initiation of ESAs.  Fear of  needles:  I feel that some progress was made today in terms of the patient's willingness to face his fear of needles after our long discussion. Patient was agreeable to 2 lab draws this hospitalization.   Long term planning in anticipation of dialysis: Patients has several limiting factors in the establishment of long-term hemodialysis which may be expected in the future given his decline in renal function. Patient has a severe fear of needles as noted above. Additionally, due to morbid obesity, patient needs an ambulance to be transported to any clinic appointments or future hemodialysis sessions.  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 Patient has previously been treated for severe infections which may account for some of these elevations. No concerning morphologies on peripheral smear. However, patient does not currently clinically appear to be infected. However, it has been a neutrophilic predominance in the more short-term since December 2015.   Morbid obesity: Patient currently at 472 pounds. Body habitus limits diagnostics in multiple ways. -  Consult dietician.  -PT recommending SNF placement  Type 2 diabetes: Blood glucoses have remained within a normal range in the low 100s. Patient's last hemoglobin A1c of 8.6 on February 2016. Patient is on Lantus 30 units daily at bedtime and NovoLog 10 units 3 times a day before meals. -Hold home Lantus 30 units daily for now given normoglycemia.  -Sliding-scale insulin  Hypertension: Patient has had slightly elevated blood pressures in the 449Q to 759F systolic. Patient is on hydralazine 25 mg 3 times a day, amlodipine 10 mg daily, and Lasix 40 mg daily. Patient has been normotensive since his admission. -Continue home regimen.  Low vitamin D: Ergocalciferol 50,000 units every 2 weeks for 12 weeks. First dose administered on 12/24/14.   Diet: Carb  modified, renal Prophylaxis: SCDs as patient is refusing subcutaneous heparin Code: Full  Dispo: Disposition is deferred at this time, awaiting improvement of current medical problems. Anticipated discharge in approximately 0 day(s).   The patient does have a current PCP (Tasrif Ahmed, MD) and does need an [facility] hospital follow-up appointment after discharge. pth The patient does have transportation limitations that hinder transportation to clinic appointments.   LOS: 3 days   Services Needed at time of discharge: Y = Yes, Blank = No PT:   OT:   RN:   Equipment:   Other:    Luan Moore, MD 12/27/2014, 6:43 AM

## 2014-12-27 NOTE — Plan of Care (Signed)
Problem: Phase I Progression Outcomes Goal: OOB as tolerated unless otherwise ordered Outcome: Adequate for Discharge Sits on side of bed

## 2014-12-27 NOTE — Discharge Instructions (Signed)
Your blood glucose levels have been within the normal range during your hospitalization. For this reason, please reduce you Trujeo from 30 units to 10 units daily until your next clinic appointment.

## 2014-12-27 NOTE — Progress Notes (Signed)
  Date: 12/27/2014  Patient name: Adrian Khan  Medical record number: [id number]  Date of birth: 06/07/1976   This patient's plan of care was discussed with the house staff. Please see their note for complete details. I concur with their findings.   Exam:  Adrian Khan seems in fairly good spirits today is alert and oriented.  Perivascular exam rate and rhythm distant heart sounds  Pulmonary no respiratory distress  Neurological exam nonfocal  Assessment plan  #1 Acute on CKD: GFR better by 24 hour urine, GREATLY appreciate Dr. Posey Khan being willing to help manage his care via reviewing labs, telephonic assitsance in [facility]  #2 Hyperkalemia: sufficiently resolved  #3 Hyperphosphatemia: dc on phosphate binders  #4 Leukocytosis: chronic and no clear indication to pursue workup at this time.  He is fine to DC to home today.  Adrian Hayward, MD 12/27/2014, 2:47 PM

## 2014-12-27 NOTE — Plan of Care (Signed)
Problem: Phase II Progression Outcomes Goal: Progress activity as tolerated unless otherwise ordered Outcome: Adequate for Discharge At baseline

## 2014-12-27 NOTE — Clinical Social Work Note (Signed)
Patient discharging home today with [place] home health services and an aide who assists patient 5 days a week. CSW facilitated transport via [place]. CSW signing off as patient discharging home today.   Sagar Tengan Givens, MSW, LCSW Licensed Clinical Social Worker [place] [phone]

## 2015-01-02 ENCOUNTER — Ambulatory Visit (INDEPENDENT_AMBULATORY_CARE_PROVIDER_SITE_OTHER): Payer: Medicare Other | Admitting: Internal Medicine

## 2015-01-02 ENCOUNTER — Encounter: Payer: Self-pay | Admitting: Internal Medicine

## 2015-01-02 VITALS — BP 163/78 | HR 90 | Temp 97.5°F

## 2015-01-02 DIAGNOSIS — N049 Nephrotic syndrome with unspecified morphologic changes: Secondary | ICD-10-CM | POA: Diagnosis not present

## 2015-01-02 DIAGNOSIS — E1129 Type 2 diabetes mellitus with other diabetic kidney complication: Secondary | ICD-10-CM | POA: Diagnosis not present

## 2015-01-02 DIAGNOSIS — IMO0002 Reserved for concepts with insufficient information to code with codable children: Secondary | ICD-10-CM

## 2015-01-02 DIAGNOSIS — N189 Chronic kidney disease, unspecified: Secondary | ICD-10-CM | POA: Diagnosis not present

## 2015-01-02 DIAGNOSIS — I10 Essential (primary) hypertension: Secondary | ICD-10-CM | POA: Diagnosis not present

## 2015-01-02 DIAGNOSIS — E1165 Type 2 diabetes mellitus with hyperglycemia: Secondary | ICD-10-CM | POA: Diagnosis not present

## 2015-01-02 DIAGNOSIS — E559 Vitamin D deficiency, unspecified: Secondary | ICD-10-CM

## 2015-01-02 DIAGNOSIS — D631 Anemia in chronic kidney disease: Secondary | ICD-10-CM

## 2015-01-02 LAB — CBC WITH DIFFERENTIAL/PLATELET
BASOS PCT: 0 % (ref 0–1)
Basophils Absolute: 0 10*3/uL (ref 0.0–0.1)
EOS ABS: 0.4 10*3/uL (ref 0.0–0.7)
Eosinophils Relative: 3 % (ref 0–5)
HCT: 26.8 % — ABNORMAL LOW (ref 39.0–52.0)
Hemoglobin: 8.4 g/dL — ABNORMAL LOW (ref 13.0–17.0)
LYMPHS ABS: 1.5 10*3/uL (ref 0.7–4.0)
LYMPHS PCT: 10 % — AB (ref 12–46)
MCH: 27.8 pg (ref 26.0–34.0)
MCHC: 31.3 g/dL (ref 30.0–36.0)
MCV: 88.7 fL (ref 78.0–100.0)
MONO ABS: 1 10*3/uL (ref 0.1–1.0)
MONOS PCT: 6 % (ref 3–12)
NEUTROS PCT: 81 % — AB (ref 43–77)
Neutro Abs: 13 10*3/uL — ABNORMAL HIGH (ref 1.7–7.7)
Platelets: 347 10*3/uL (ref 150–400)
RBC: 3.02 MIL/uL — AB (ref 4.22–5.81)
RDW: 12.6 % (ref 11.5–15.5)
WBC: 15.9 10*3/uL — AB (ref 4.0–10.5)

## 2015-01-02 LAB — RENAL FUNCTION PANEL
ALBUMIN: 2.2 g/dL — AB (ref 3.5–5.2)
Anion gap: 11 (ref 5–15)
BUN: 55 mg/dL — ABNORMAL HIGH (ref 6–23)
CO2: 21 mmol/L (ref 19–32)
Calcium: 7.7 mg/dL — ABNORMAL LOW (ref 8.4–10.5)
Chloride: 105 mmol/L (ref 96–112)
Creatinine, Ser: 4.87 mg/dL — ABNORMAL HIGH (ref 0.50–1.35)
GFR calc Af Amer: 16 mL/min — ABNORMAL LOW (ref >90)
GFR calc non Af Amer: 14 mL/min — ABNORMAL LOW (ref >90)
Glucose, Bld: 154 mg/dL — ABNORMAL HIGH (ref 70–99)
Phosphorus: 4.8 mg/dL — ABNORMAL HIGH (ref 2.3–4.6)
Potassium: 4.3 mmol/L (ref 3.5–5.1)
Sodium: 137 mmol/L (ref 135–145)

## 2015-01-02 LAB — GLUCOSE, CAPILLARY: Glucose-Capillary: 190 mg/dL — ABNORMAL HIGH (ref 70–99)

## 2015-01-02 NOTE — Patient Instructions (Signed)
-  Keep taking lasix 80 mg twice a day for your swelling and attend your appointment with Dr. Posey Pronto -Keep taking Humalog 10 U and toujeo 10 U daily for your diabetes -Keep taking your other medications  -I will call you with your lab results, pleasure meeting you today!!   General Instructions:   Thank you for bringing your medicines today. This helps [place] keep you safe from mistakes.   Progress Toward Treatment Goals:  Treatment Goal 11/25/2014  Hemoglobin A1C improved  Blood pressure unable to assess    Self Care Goals & Plans:  Self Care Goal 10/24/2014  Manage my medications take my medicines as prescribed; bring my medications to every visit  Monitor my health keep track of my blood glucose; bring my glucose meter and log to each visit; keep track of my blood pressure; bring my blood pressure log to each visit; keep track of my weight; check my feet daily  Eat healthy foods eat more vegetables; eat baked foods instead of fried foods; eat fruit for snacks and desserts; eat smaller portions    Home Blood Glucose Monitoring 11/25/2014  Check my blood sugar once a day  When to check my blood sugar before breakfast     Care Management & Community Referrals:  No flowsheet data found.

## 2015-01-02 NOTE — Progress Notes (Signed)
Patient ID: Adrian Khan, male   DOB: 1976/06/03, 39 y.o.   MRN: [id number]    Subjective:   Patient ID: Adrian Khan male   DOB: [birth year] 39 y.o.   MRN: [id number]  HPI: [name] is a 39 y.o. pleasant man with past medical history of morbid obesity, bed bound, insulin-dependent Type 2 DM, nephrotic syndrome in setting of CKD Stage 4, hypertension, ACD, vitamin D deficiency, anxiety/depression, and OSA who presents for hospital follow-up visit. He is accompanied by his mother.   He was hospitalized from 3/8-3/11 for hyperkalemia (K 5.6) and anasarca in setting of CKD Stage IV complicated by nephrotic syndrome (7.7 g proteinuria on 10/01/14). He was instructed to take lasix 80 mg BID which he has been complaint with and has had improved edema but still continues to have mild b/l LE edema as well as in his breast region. He reports good urine output without urinary symptoms or hematuria.  He is compliant with taking phoslo with meals and bicarbonate. He has been restricting his intake of potassium and fluids. He was found to have vitamin D deficiency during hospitalization and started on ergocalciferol weekly which he has been complaint with. He has an appointment with nephrology on March 22nd which he plans on attending.  His last A1c was on 8.6 on 11/25/14. At discharge his Lantus 30 U daily and Novolog with meals was discontinued. He was instructed to take 10 U of Humalog after supper and toujeo 10 U at bedtime which he has been complaint with. He checks his blood sugar once daily and has brought in his meter today which reveals range of 172-251 since discharge. He has chronic peripheral neuropathy but denies symptomatic hypoglycemia, polyuria, polydipsia, polyphagia, blurry vision, or foot injury/ulcer. He reports following a healthy diet but unfortunately bedbound due to morbid obesity and unable to exercise. He is unsure if his weight has changed. He has not had recent eye exam due  to problems with fitting into a chair.   He has been compliant with taking hydralazine, lasix, and amlodipine for hypertension. He denies headache, blurry vision,chest pain, or lightheadedness.    He has anemia of kidney disease with Hg of 7.2 on discharge without active bleeding or requirement for blood transfusion. He was instructed to take ferrous gluconate BID on discharge which he has been compliant with. He denies bleeding, fatigue, weakness, or syncope.     Past Medical History  Diagnosis Date  . Shortness of breath     with exertion  . PONV (postoperative nausea and vomiting)   . Anxiety   . Mental disorder   . Depression   . GERD (gastroesophageal reflux disease)   . Constipation   . Neuropathy     feet, hands  . Anginal pain     chest pressure has increased in the past few days; patient denied  on 09/20/12  . Obesity   . Hx MRSA infection   . Hypertension   . Walking pneumonia   . Sleep apnea     "does not wear mask" (12/24/2014)  . 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 Type II diabetes mellitus   . History of blood transfusion     "related to foot surgery"  . History of stomach ulcers   . Septic arthritis of shoulder, left   . Chronic kidney disease     "here w/kidney problems today" (12/24/2014)   Current Outpatient Prescriptions  Medication Sig  Dispense Refill  . amLODipine (NORVASC) 10 MG tablet TAKE 1 TABLET (10 MG TOTAL) BY MOUTH DAILY. 30 tablet 0  . B-D ULTRAFINE III SHORT PEN 31G X 8 MM MISC USE AS DIRECTED WITH FLEXPEN 100 each 0  . Blood Glucose Monitoring Suppl (ONE TOUCH ULTRA MINI) W/DEVICE KIT Check blood sugar one time a day 1 each 1  . calcium acetate (PHOSLO) 667 MG capsule Take 3 capsules (2,001 mg total) by mouth 3 (three) times daily with meals. 90 capsule 0  . ferrous gluconate (FERGON) 324 MG tablet Take 1 tablet (324 mg total) by mouth 2 (two) times daily with a meal. 60 tablet 3  . fluticasone (FLONASE) 50  MCG/ACT nasal spray Place 1 spray into both nostrils daily. 16 g 2  . furosemide (LASIX) 80 MG tablet Take 1 tablet (80 mg total) by mouth 2 (two) times daily. 30 tablet 0  . glucose blood (ONE TOUCH ULTRA TEST) test strip Check blood sugar one time a day 100 each 8  . HUMALOG KWIKPEN 100 UNIT/ML KiwkPen Inject 10 Units as directed daily after supper.  0  . hydrALAZINE (APRESOLINE) 25 MG tablet Take 1 tablet (25 mg total) by mouth 3 (three) times daily. 90 tablet 2  . nystatin (MYCOSTATIN/NYSTOP) 100000 UNIT/GM POWD Apply to affected areas on the lower abdomen/groin area twice daily. Keep skin dry. (Patient not taking: Reported on 12/24/2014) 30 g 0  . ONETOUCH DELICA LANCETS FINE MISC Check blood sugar one time a day 100 each 5  . senna-docusate (SENOKOT-S) 8.6-50 MG per tablet Take 1 tablet by mouth 2 (two) times daily as needed for constipation.    . sodium bicarbonate 650 MG tablet TAKE 2 TABLETS (1,300 MG TOTAL) BY MOUTH 3 (THREE) TIMES DAILY. 90 tablet 0  . TOUJEO SOLOSTAR 300 UNIT/ML SOPN Inject 10 Units as directed at bedtime. 1.5 mL 0  . Vitamin D, Ergocalciferol, (DRISDOL) 50000 UNITS CAPS capsule Take 1 capsule (50,000 Units total) by mouth every 7 (seven) days. 11 capsule 0   No current facility-administered medications for this visit.   No family history on file. History   Social History  . Marital Status: Single    Spouse Name: N/A  . Number of Children: N/A  . Years of Education: N/A   Social History Main Topics  . Smoking status: Never Smoker   . Smokeless tobacco: Never Used  . Alcohol Use: No  . Drug Use: No  . Sexual Activity: Not Currently   Other Topics Concern  . None   Social History Narrative   Review of Systems: Review of Systems  Constitutional: Negative for fever, chills and malaise/fatigue.  HENT: Positive for congestion.   Eyes: Negative for blurred vision.  Respiratory: Negative for cough and shortness of breath.   Cardiovascular: Positive for leg  swelling (improved b/l LE ). Negative for chest pain.  Gastrointestinal: Positive for diarrhea (loose stools). Negative for nausea, vomiting, abdominal pain, constipation and blood in stool.  Genitourinary: Negative for dysuria, urgency, frequency and hematuria.  Neurological: Negative for dizziness and headaches.  Endo/Heme/Allergies: Positive for environmental allergies.    Objective:  Physical Exam: Filed Vitals:   01/02/15 1040  BP: 163/78  Pulse: 90  Temp: 97.5 F (36.4 C)  TempSrc: Oral  SpO2: 99%    Physical Exam  Constitutional: He is oriented to person, place, and time. He appears well-developed and well-nourished. No distress.  Morbidly obese on stretcher   HENT:  Head: Normocephalic and atraumatic.  Eyes: EOM are normal.  Neck: Normal range of motion. Neck supple.  Cardiovascular: Normal rate and regular rhythm.   Distant heart sounds.  Pulmonary/Chest: Effort normal. No respiratory distress. He has no wheezes. He has no rales.  Distant breath sounds.   Abdominal: Soft. Bowel sounds are normal. He exhibits no distension. There is no tenderness. There is no rebound and no guarding.  Musculoskeletal: Normal range of motion. He exhibits edema (+1 b/l LE pitting edema ). He exhibits no tenderness.  Neurological: He is alert and oriented to person, place, and time.  Skin: Skin is warm and dry. No rash noted. He is not diaphoretic. No erythema. No pallor.  Psychiatric: He has a normal mood and affect. His behavior is normal. Judgment and thought content normal.    Assessment & Plan:   Please see problem list for problem-based assessment and plan

## 2015-01-03 DIAGNOSIS — K922 Gastrointestinal hemorrhage, unspecified: Secondary | ICD-10-CM | POA: Diagnosis not present

## 2015-01-03 DIAGNOSIS — I6789 Other cerebrovascular disease: Secondary | ICD-10-CM | POA: Diagnosis not present

## 2015-01-03 DIAGNOSIS — N2581 Secondary hyperparathyroidism of renal origin: Secondary | ICD-10-CM | POA: Insufficient documentation

## 2015-01-03 NOTE — Assessment & Plan Note (Addendum)
Assessment: Pt vitamin D deficiency in setting of secondary hyperparathyroidism with vitamin D level of <4 on 12/23/14 compliant with newly started vitamin D replacement who presents with no recent fall or fracture.    Plan:  -Continue ergocalciferol 50K U weekly for 12 week course -Pt needs vitamin D level rechecked in early May

## 2015-01-03 NOTE — Assessment & Plan Note (Signed)
Assessment: Pt with last A1c of 8.6 on 11/25/14 compliant with newly changed insulin regimen who presents with CBG of 190.   Plan:  -A1c 8.6 not at goal <7, continue Humalog 10 U after supper and toujeo 10 U at bedtime, pt to return in 1 month with glucose meter for further insulin adjustment  -BP 163/78 not at goal <140/90, continue lasix 80 mg BID, hydralazine 25 mg TID, and amlodipine 10 mg daily  -LDL97 at goal <100 not currently on statin therapy -Last annual foot exam on 10/01/14 -Unable to obtain annual eye exam due to inability to fit in chair from morbid obesity -Last annual microalbumin on 10/01/14 with 7.7 g of proteinuria -BMI 78.66 not at goal <30, encourage weight loss (pt unfortunately not candide for bariatric surgery due to comorbidites)

## 2015-01-03 NOTE — Assessment & Plan Note (Signed)
Assessment: Pt with moderately controlled hypertension compliant with three-class (CCB, nitrate, & diuretic) anti-hypertensive therapy who presents with blood pressure of 163/78.   Plan: -BP 163/78 not at goal <140/90 -Continue lasix 80 mg BID, hydralazine 25 mg TID, and amlodipine 10 mg daily

## 2015-01-03 NOTE — Assessment & Plan Note (Signed)
Assessment: Pt with recent hospitalization for anasarca in setting of nephrotic syndrome (proteinuria of 7.7 g on 10/01/14) and CKD Stage IV (GFR 15-16) not on HD who presents with improved volume overload with diuretic therapy.   Plan:  -Obtain stat renal function panel ---> K normal at 4.3, bicarbonate normal at 21, Phos elevated at 4.87 (improved form 6.3), GFR 16 with Cr 4.87 (improved from GFR 15 and Cr 5.10)  -Continue PO lasix 80 mg BID (pt still with mild volume overload)  -Continue bicarbonate 1300 mg TID and phoslo 2001 mg TID -Continue hydralazine 25 mg TID and amlodipine 10 mg daily for hypertension  -Unable to assess weight due to morbid obesity -Continue fluid/salt and potassium/phosphorus restricted diet -Pt to follow-up with nephrologist Dr. Posey Pronto on March 22 which I will route this note to

## 2015-01-03 NOTE — Assessment & Plan Note (Addendum)
Assessment: Pt with chronic normocytic anemia in setting of CKD stage IV with last anemia panel on 12/26/14 with iron sat <20% and Hg <10 compliant with oral iron therapy and likely candidate for epo administration who presents with no active bleeding or hemodynamic instability.   Plan:  -Obtain CBC w/diff --> Hg improved to 8.4 from 7.2 on discharge  -Continue ferrous gluconate 325 mg BID with meals for iron repletion before administration of epo   -Pt to follow-up with nephrology on March 22 for further discussion of epo therapy

## 2015-01-03 NOTE — Progress Notes (Signed)
Internal Medicine Clinic Attending Date of visit: 01/02/2015  Case discussed with Dr. Naaman Plummer soon after the resident saw the patient on the day of the visit .  We reviewed the resident's history and exam and pertinent patient test results.  I agree with the assessment, diagnosis, and plan of care documented in the resident's note.

## 2015-01-07 DIAGNOSIS — G609 Hereditary and idiopathic neuropathy, unspecified: Secondary | ICD-10-CM | POA: Diagnosis not present

## 2015-01-07 DIAGNOSIS — I10 Essential (primary) hypertension: Secondary | ICD-10-CM | POA: Diagnosis not present

## 2015-01-07 DIAGNOSIS — E1122 Type 2 diabetes mellitus with diabetic chronic kidney disease: Secondary | ICD-10-CM | POA: Diagnosis not present

## 2015-01-07 DIAGNOSIS — N189 Chronic kidney disease, unspecified: Secondary | ICD-10-CM | POA: Diagnosis not present

## 2015-01-08 DIAGNOSIS — F418 Other specified anxiety disorders: Secondary | ICD-10-CM | POA: Diagnosis not present

## 2015-01-08 DIAGNOSIS — I120 Hypertensive chronic kidney disease with stage 5 chronic kidney disease or end stage renal disease: Secondary | ICD-10-CM | POA: Diagnosis not present

## 2015-01-08 DIAGNOSIS — E875 Hyperkalemia: Secondary | ICD-10-CM | POA: Diagnosis not present

## 2015-01-08 DIAGNOSIS — N189 Chronic kidney disease, unspecified: Secondary | ICD-10-CM | POA: Diagnosis not present

## 2015-01-08 DIAGNOSIS — E1122 Type 2 diabetes mellitus with diabetic chronic kidney disease: Secondary | ICD-10-CM | POA: Diagnosis not present

## 2015-01-08 DIAGNOSIS — E669 Obesity, unspecified: Secondary | ICD-10-CM | POA: Diagnosis not present

## 2015-01-08 DIAGNOSIS — Z7401 Bed confinement status: Secondary | ICD-10-CM | POA: Diagnosis not present

## 2015-01-08 DIAGNOSIS — Z794 Long term (current) use of insulin: Secondary | ICD-10-CM | POA: Diagnosis not present

## 2015-01-09 ENCOUNTER — Telehealth: Payer: Self-pay | Admitting: *Deleted

## 2015-01-09 NOTE — Telephone Encounter (Signed)
Call from [facility] with [place] [phone]  Skilled nursing will see pt once a week for 4 weeks then every other week for 5 weeks.  Total of 9 weeks. They will do med teaching, CBG checks, insulin injections and teaching for renal failure.  FYI

## 2015-01-15 DIAGNOSIS — Z794 Long term (current) use of insulin: Secondary | ICD-10-CM | POA: Diagnosis not present

## 2015-01-15 DIAGNOSIS — Z7401 Bed confinement status: Secondary | ICD-10-CM | POA: Diagnosis not present

## 2015-01-15 DIAGNOSIS — F418 Other specified anxiety disorders: Secondary | ICD-10-CM | POA: Diagnosis not present

## 2015-01-15 DIAGNOSIS — E669 Obesity, unspecified: Secondary | ICD-10-CM | POA: Diagnosis not present

## 2015-01-15 DIAGNOSIS — I120 Hypertensive chronic kidney disease with stage 5 chronic kidney disease or end stage renal disease: Secondary | ICD-10-CM | POA: Diagnosis not present

## 2015-01-15 DIAGNOSIS — E875 Hyperkalemia: Secondary | ICD-10-CM | POA: Diagnosis not present

## 2015-01-15 DIAGNOSIS — E1122 Type 2 diabetes mellitus with diabetic chronic kidney disease: Secondary | ICD-10-CM | POA: Diagnosis not present

## 2015-01-15 DIAGNOSIS — N189 Chronic kidney disease, unspecified: Secondary | ICD-10-CM | POA: Diagnosis not present

## 2015-01-20 DIAGNOSIS — F418 Other specified anxiety disorders: Secondary | ICD-10-CM | POA: Diagnosis not present

## 2015-01-20 DIAGNOSIS — Z7401 Bed confinement status: Secondary | ICD-10-CM | POA: Diagnosis not present

## 2015-01-20 DIAGNOSIS — E1122 Type 2 diabetes mellitus with diabetic chronic kidney disease: Secondary | ICD-10-CM | POA: Diagnosis not present

## 2015-01-20 DIAGNOSIS — I120 Hypertensive chronic kidney disease with stage 5 chronic kidney disease or end stage renal disease: Secondary | ICD-10-CM | POA: Diagnosis not present

## 2015-01-20 DIAGNOSIS — E875 Hyperkalemia: Secondary | ICD-10-CM | POA: Diagnosis not present

## 2015-01-20 DIAGNOSIS — Z794 Long term (current) use of insulin: Secondary | ICD-10-CM | POA: Diagnosis not present

## 2015-01-20 DIAGNOSIS — N189 Chronic kidney disease, unspecified: Secondary | ICD-10-CM | POA: Diagnosis not present

## 2015-01-20 DIAGNOSIS — E669 Obesity, unspecified: Secondary | ICD-10-CM | POA: Diagnosis not present

## 2015-01-22 DIAGNOSIS — Z7401 Bed confinement status: Secondary | ICD-10-CM | POA: Diagnosis not present

## 2015-01-22 DIAGNOSIS — I120 Hypertensive chronic kidney disease with stage 5 chronic kidney disease or end stage renal disease: Secondary | ICD-10-CM | POA: Diagnosis not present

## 2015-01-22 DIAGNOSIS — E875 Hyperkalemia: Secondary | ICD-10-CM | POA: Diagnosis not present

## 2015-01-22 DIAGNOSIS — Z794 Long term (current) use of insulin: Secondary | ICD-10-CM | POA: Diagnosis not present

## 2015-01-22 DIAGNOSIS — E1122 Type 2 diabetes mellitus with diabetic chronic kidney disease: Secondary | ICD-10-CM | POA: Diagnosis not present

## 2015-01-22 DIAGNOSIS — F418 Other specified anxiety disorders: Secondary | ICD-10-CM | POA: Diagnosis not present

## 2015-01-22 DIAGNOSIS — N189 Chronic kidney disease, unspecified: Secondary | ICD-10-CM | POA: Diagnosis not present

## 2015-01-22 DIAGNOSIS — E669 Obesity, unspecified: Secondary | ICD-10-CM | POA: Diagnosis not present

## 2015-01-23 ENCOUNTER — Other Ambulatory Visit: Payer: Self-pay | Admitting: Internal Medicine

## 2015-01-23 DIAGNOSIS — E875 Hyperkalemia: Secondary | ICD-10-CM | POA: Diagnosis not present

## 2015-01-23 DIAGNOSIS — Z7401 Bed confinement status: Secondary | ICD-10-CM | POA: Diagnosis not present

## 2015-01-23 DIAGNOSIS — I120 Hypertensive chronic kidney disease with stage 5 chronic kidney disease or end stage renal disease: Secondary | ICD-10-CM | POA: Diagnosis not present

## 2015-01-23 DIAGNOSIS — Z794 Long term (current) use of insulin: Secondary | ICD-10-CM | POA: Diagnosis not present

## 2015-01-23 DIAGNOSIS — F418 Other specified anxiety disorders: Secondary | ICD-10-CM | POA: Diagnosis not present

## 2015-01-23 DIAGNOSIS — E1122 Type 2 diabetes mellitus with diabetic chronic kidney disease: Secondary | ICD-10-CM | POA: Diagnosis not present

## 2015-01-23 DIAGNOSIS — N189 Chronic kidney disease, unspecified: Secondary | ICD-10-CM | POA: Diagnosis not present

## 2015-01-23 DIAGNOSIS — E669 Obesity, unspecified: Secondary | ICD-10-CM | POA: Diagnosis not present

## 2015-01-27 DIAGNOSIS — N189 Chronic kidney disease, unspecified: Secondary | ICD-10-CM | POA: Diagnosis not present

## 2015-01-27 DIAGNOSIS — Z794 Long term (current) use of insulin: Secondary | ICD-10-CM | POA: Diagnosis not present

## 2015-01-27 DIAGNOSIS — E1122 Type 2 diabetes mellitus with diabetic chronic kidney disease: Secondary | ICD-10-CM | POA: Diagnosis not present

## 2015-01-27 DIAGNOSIS — Z7401 Bed confinement status: Secondary | ICD-10-CM | POA: Diagnosis not present

## 2015-01-27 DIAGNOSIS — E875 Hyperkalemia: Secondary | ICD-10-CM | POA: Diagnosis not present

## 2015-01-27 DIAGNOSIS — E669 Obesity, unspecified: Secondary | ICD-10-CM | POA: Diagnosis not present

## 2015-01-27 DIAGNOSIS — I120 Hypertensive chronic kidney disease with stage 5 chronic kidney disease or end stage renal disease: Secondary | ICD-10-CM | POA: Diagnosis not present

## 2015-01-27 DIAGNOSIS — F418 Other specified anxiety disorders: Secondary | ICD-10-CM | POA: Diagnosis not present

## 2015-01-29 DIAGNOSIS — E669 Obesity, unspecified: Secondary | ICD-10-CM | POA: Diagnosis not present

## 2015-01-29 DIAGNOSIS — E1122 Type 2 diabetes mellitus with diabetic chronic kidney disease: Secondary | ICD-10-CM | POA: Diagnosis not present

## 2015-01-29 DIAGNOSIS — F418 Other specified anxiety disorders: Secondary | ICD-10-CM | POA: Diagnosis not present

## 2015-01-29 DIAGNOSIS — Z794 Long term (current) use of insulin: Secondary | ICD-10-CM | POA: Diagnosis not present

## 2015-01-29 DIAGNOSIS — N189 Chronic kidney disease, unspecified: Secondary | ICD-10-CM | POA: Diagnosis not present

## 2015-01-29 DIAGNOSIS — E875 Hyperkalemia: Secondary | ICD-10-CM | POA: Diagnosis not present

## 2015-01-29 DIAGNOSIS — Z7401 Bed confinement status: Secondary | ICD-10-CM | POA: Diagnosis not present

## 2015-01-29 DIAGNOSIS — I120 Hypertensive chronic kidney disease with stage 5 chronic kidney disease or end stage renal disease: Secondary | ICD-10-CM | POA: Diagnosis not present

## 2015-02-02 DIAGNOSIS — E1122 Type 2 diabetes mellitus with diabetic chronic kidney disease: Secondary | ICD-10-CM | POA: Diagnosis not present

## 2015-02-02 DIAGNOSIS — F418 Other specified anxiety disorders: Secondary | ICD-10-CM | POA: Diagnosis not present

## 2015-02-02 DIAGNOSIS — E875 Hyperkalemia: Secondary | ICD-10-CM | POA: Diagnosis not present

## 2015-02-02 DIAGNOSIS — I120 Hypertensive chronic kidney disease with stage 5 chronic kidney disease or end stage renal disease: Secondary | ICD-10-CM | POA: Diagnosis not present

## 2015-02-02 DIAGNOSIS — E669 Obesity, unspecified: Secondary | ICD-10-CM | POA: Diagnosis not present

## 2015-02-02 DIAGNOSIS — N189 Chronic kidney disease, unspecified: Secondary | ICD-10-CM | POA: Diagnosis not present

## 2015-02-02 DIAGNOSIS — Z7401 Bed confinement status: Secondary | ICD-10-CM | POA: Diagnosis not present

## 2015-02-02 DIAGNOSIS — Z794 Long term (current) use of insulin: Secondary | ICD-10-CM | POA: Diagnosis not present

## 2015-02-05 DIAGNOSIS — Z794 Long term (current) use of insulin: Secondary | ICD-10-CM | POA: Diagnosis not present

## 2015-02-05 DIAGNOSIS — I120 Hypertensive chronic kidney disease with stage 5 chronic kidney disease or end stage renal disease: Secondary | ICD-10-CM | POA: Diagnosis not present

## 2015-02-05 DIAGNOSIS — E669 Obesity, unspecified: Secondary | ICD-10-CM | POA: Diagnosis not present

## 2015-02-05 DIAGNOSIS — E875 Hyperkalemia: Secondary | ICD-10-CM | POA: Diagnosis not present

## 2015-02-05 DIAGNOSIS — E1122 Type 2 diabetes mellitus with diabetic chronic kidney disease: Secondary | ICD-10-CM | POA: Diagnosis not present

## 2015-02-05 DIAGNOSIS — Z7401 Bed confinement status: Secondary | ICD-10-CM | POA: Diagnosis not present

## 2015-02-05 DIAGNOSIS — F418 Other specified anxiety disorders: Secondary | ICD-10-CM | POA: Diagnosis not present

## 2015-02-05 DIAGNOSIS — N189 Chronic kidney disease, unspecified: Secondary | ICD-10-CM | POA: Diagnosis not present

## 2015-02-10 DIAGNOSIS — E875 Hyperkalemia: Secondary | ICD-10-CM | POA: Diagnosis not present

## 2015-02-10 DIAGNOSIS — E669 Obesity, unspecified: Secondary | ICD-10-CM | POA: Diagnosis not present

## 2015-02-10 DIAGNOSIS — E1122 Type 2 diabetes mellitus with diabetic chronic kidney disease: Secondary | ICD-10-CM | POA: Diagnosis not present

## 2015-02-10 DIAGNOSIS — Z7401 Bed confinement status: Secondary | ICD-10-CM | POA: Diagnosis not present

## 2015-02-10 DIAGNOSIS — Z794 Long term (current) use of insulin: Secondary | ICD-10-CM | POA: Diagnosis not present

## 2015-02-10 DIAGNOSIS — I120 Hypertensive chronic kidney disease with stage 5 chronic kidney disease or end stage renal disease: Secondary | ICD-10-CM | POA: Diagnosis not present

## 2015-02-10 DIAGNOSIS — F418 Other specified anxiety disorders: Secondary | ICD-10-CM | POA: Diagnosis not present

## 2015-02-10 DIAGNOSIS — N189 Chronic kidney disease, unspecified: Secondary | ICD-10-CM | POA: Diagnosis not present

## 2015-02-13 DIAGNOSIS — N189 Chronic kidney disease, unspecified: Secondary | ICD-10-CM | POA: Diagnosis not present

## 2015-02-13 DIAGNOSIS — F418 Other specified anxiety disorders: Secondary | ICD-10-CM | POA: Diagnosis not present

## 2015-02-13 DIAGNOSIS — I120 Hypertensive chronic kidney disease with stage 5 chronic kidney disease or end stage renal disease: Secondary | ICD-10-CM | POA: Diagnosis not present

## 2015-02-13 DIAGNOSIS — E1122 Type 2 diabetes mellitus with diabetic chronic kidney disease: Secondary | ICD-10-CM | POA: Diagnosis not present

## 2015-02-13 DIAGNOSIS — Z794 Long term (current) use of insulin: Secondary | ICD-10-CM | POA: Diagnosis not present

## 2015-02-13 DIAGNOSIS — E875 Hyperkalemia: Secondary | ICD-10-CM | POA: Diagnosis not present

## 2015-02-13 DIAGNOSIS — E669 Obesity, unspecified: Secondary | ICD-10-CM | POA: Diagnosis not present

## 2015-02-13 DIAGNOSIS — Z7401 Bed confinement status: Secondary | ICD-10-CM | POA: Diagnosis not present

## 2015-02-17 ENCOUNTER — Ambulatory Visit: Payer: Medicare Other | Admitting: Internal Medicine

## 2015-03-08 ENCOUNTER — Other Ambulatory Visit: Payer: Self-pay | Admitting: Internal Medicine

## 2015-03-10 NOTE — Telephone Encounter (Signed)
Needs appt in May or June for uncontrolled DM and HTN. Cancelled last appt

## 2015-03-10 NOTE — Addendum Note (Signed)
Addended by: Dellia Nims on: 03/10/2015 02:57 PM   Modules accepted: Orders, Medications

## 2015-03-18 ENCOUNTER — Encounter: Payer: Self-pay | Admitting: Internal Medicine

## 2015-04-26 ENCOUNTER — Other Ambulatory Visit: Payer: Self-pay | Admitting: Internal Medicine

## 2015-04-29 NOTE — Telephone Encounter (Signed)
Not sure why the request was for 40mg  daily. Last note states 80 mg BID so I will prescribe that.

## 2015-05-05 ENCOUNTER — Ambulatory Visit (INDEPENDENT_AMBULATORY_CARE_PROVIDER_SITE_OTHER): Payer: Medicare Other | Admitting: Internal Medicine

## 2015-05-05 ENCOUNTER — Encounter: Payer: Self-pay | Admitting: Internal Medicine

## 2015-05-05 VITALS — BP 187/100 | HR 102 | Temp 97.6°F

## 2015-05-05 DIAGNOSIS — I152 Hypertension secondary to endocrine disorders: Secondary | ICD-10-CM

## 2015-05-05 DIAGNOSIS — E1122 Type 2 diabetes mellitus with diabetic chronic kidney disease: Secondary | ICD-10-CM | POA: Diagnosis not present

## 2015-05-05 DIAGNOSIS — E1121 Type 2 diabetes mellitus with diabetic nephropathy: Secondary | ICD-10-CM

## 2015-05-05 DIAGNOSIS — D631 Anemia in chronic kidney disease: Secondary | ICD-10-CM

## 2015-05-05 DIAGNOSIS — IMO0002 Reserved for concepts with insufficient information to code with codable children: Secondary | ICD-10-CM

## 2015-05-05 DIAGNOSIS — E559 Vitamin D deficiency, unspecified: Secondary | ICD-10-CM

## 2015-05-05 DIAGNOSIS — E1165 Type 2 diabetes mellitus with hyperglycemia: Secondary | ICD-10-CM

## 2015-05-05 DIAGNOSIS — E1129 Type 2 diabetes mellitus with other diabetic kidney complication: Secondary | ICD-10-CM

## 2015-05-05 DIAGNOSIS — I129 Hypertensive chronic kidney disease with stage 1 through stage 4 chronic kidney disease, or unspecified chronic kidney disease: Secondary | ICD-10-CM

## 2015-05-05 DIAGNOSIS — Z794 Long term (current) use of insulin: Secondary | ICD-10-CM

## 2015-05-05 DIAGNOSIS — N184 Chronic kidney disease, stage 4 (severe): Secondary | ICD-10-CM | POA: Diagnosis not present

## 2015-05-05 DIAGNOSIS — N049 Nephrotic syndrome with unspecified morphologic changes: Secondary | ICD-10-CM

## 2015-05-05 DIAGNOSIS — N189 Chronic kidney disease, unspecified: Secondary | ICD-10-CM

## 2015-05-05 DIAGNOSIS — I10 Essential (primary) hypertension: Secondary | ICD-10-CM

## 2015-05-05 LAB — CBC
HCT: 30 % — ABNORMAL LOW (ref 39.0–52.0)
HEMOGLOBIN: 9.8 g/dL — AB (ref 13.0–17.0)
MCH: 28.3 pg (ref 26.0–34.0)
MCHC: 32.7 g/dL (ref 30.0–36.0)
MCV: 86.7 fL (ref 78.0–100.0)
MPV: 10.3 fL (ref 8.6–12.4)
Platelets: 301 10*3/uL (ref 150–400)
RBC: 3.46 MIL/uL — AB (ref 4.22–5.81)
RDW: 14.3 % (ref 11.5–15.5)
WBC: 20.2 10*3/uL — AB (ref 4.0–10.5)

## 2015-05-05 LAB — GLUCOSE, CAPILLARY: Glucose-Capillary: 132 mg/dL — ABNORMAL HIGH (ref 65–99)

## 2015-05-05 LAB — BASIC METABOLIC PANEL
BUN: 53 mg/dL — ABNORMAL HIGH (ref 6–23)
CHLORIDE: 112 meq/L (ref 96–112)
CO2: 17 meq/L — AB (ref 19–32)
Calcium: 6.8 mg/dL — ABNORMAL LOW (ref 8.4–10.5)
Creat: 5.7 mg/dL — ABNORMAL HIGH (ref 0.50–1.35)
Glucose, Bld: 147 mg/dL — ABNORMAL HIGH (ref 70–99)
Potassium: 5.5 mEq/L — ABNORMAL HIGH (ref 3.5–5.3)
Sodium: 138 mEq/L (ref 135–145)

## 2015-05-05 LAB — POCT GLYCOSYLATED HEMOGLOBIN (HGB A1C): HEMOGLOBIN A1C: 7.4

## 2015-05-05 IMAGING — CT CT ABD-PELV W/O CM
2 of 8 series · 14 of 46 positions shown, 19 images · non-contrast
Comparison: CT abdomen 07/24/2013

CLINICAL DATA: Bilateral flank pain.  High creatinine.

EXAM:
CT ABDOMEN AND PELVIS WITHOUT CONTRAST
TECHNIQUE: Multidetector CT imaging of the abdomen and pelvis was performed
following the standard protocol without IV contrast.

[Series 2: abd/ pelvis 5.0 i30f 1 · axial · 0.90mm/px · z∈[+1102,+1518]mm · 11 of 97 slices shown, 16 images]
[im 7/97  soft-tissue]
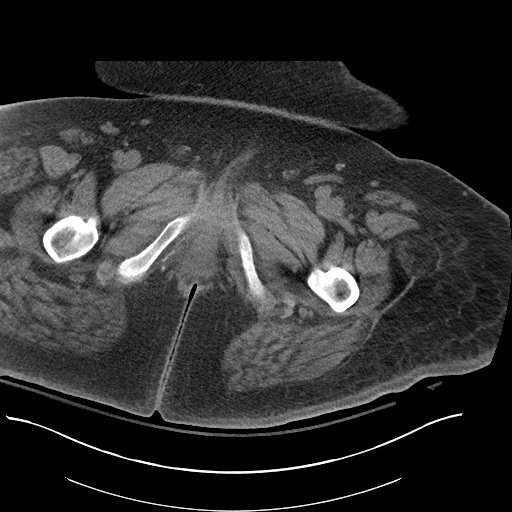
[im 7/97  bone]
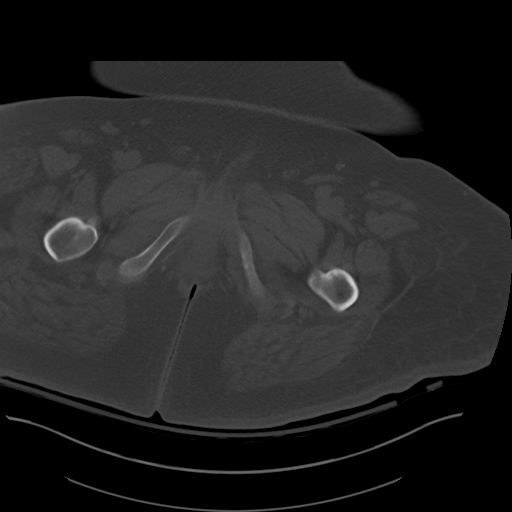
[im 14/97  soft-tissue]
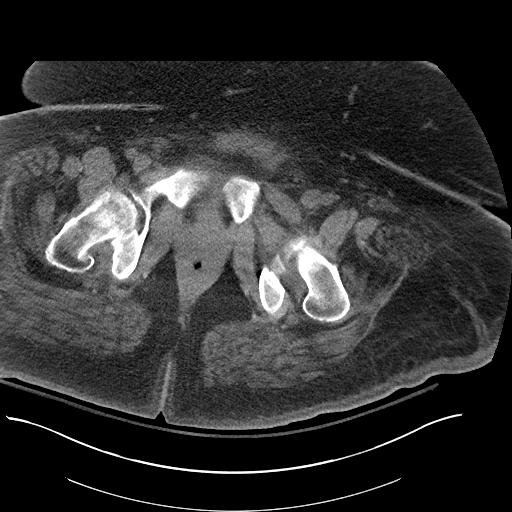
[im 28/97  soft-tissue]
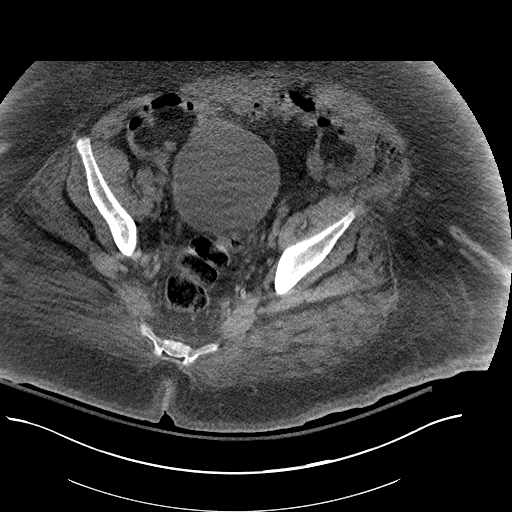
[im 35/97  soft-tissue]
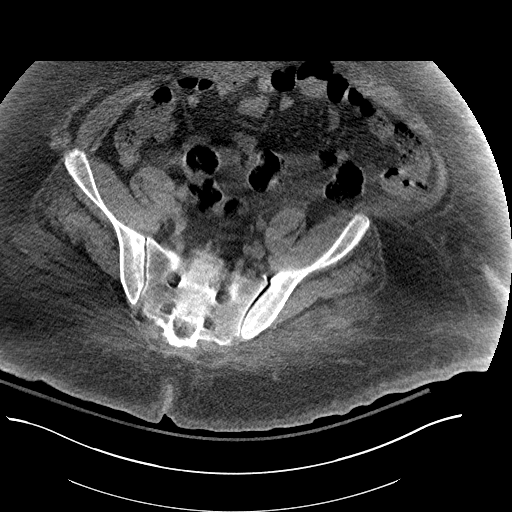
[im 42/97  soft-tissue]
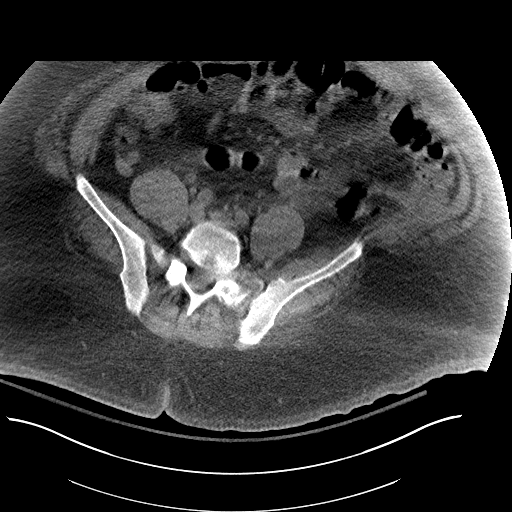
[im 55/97  soft-tissue]
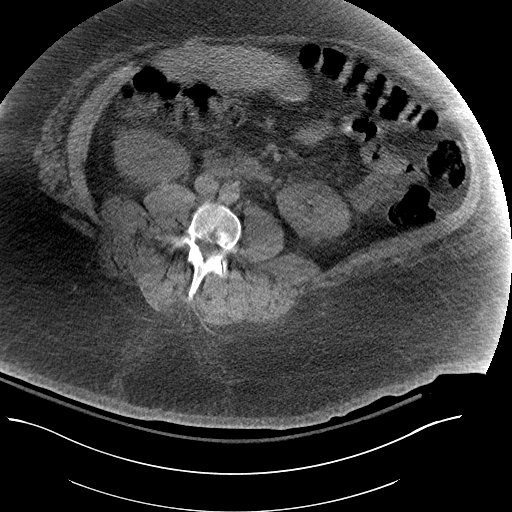
[im 62/97  soft-tissue]
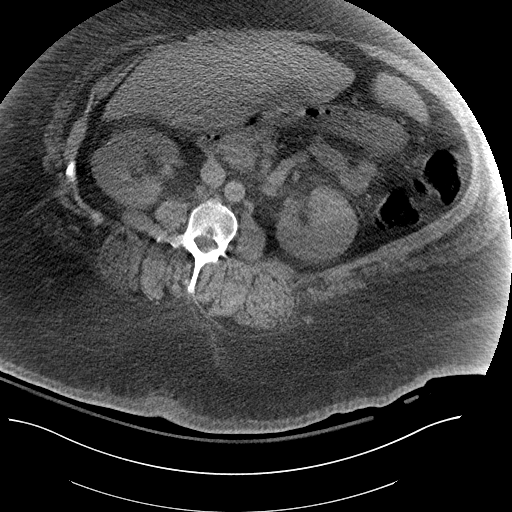
[im 69/97  soft-tissue]
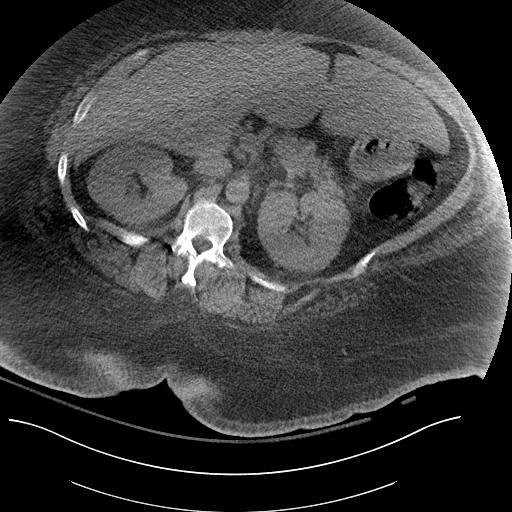
[im 69/97  lung]
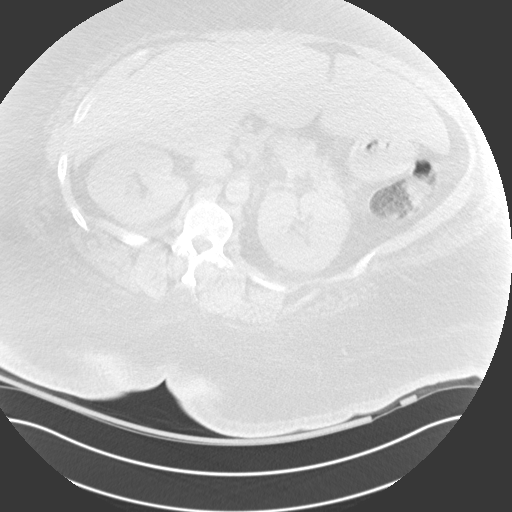
[im 76/97  lung]
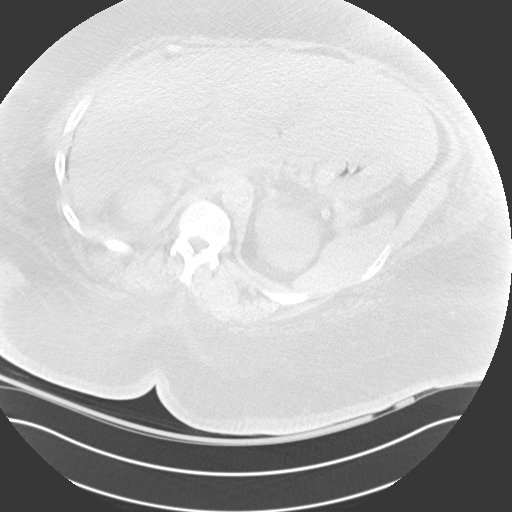
[im 83/97  soft-tissue]
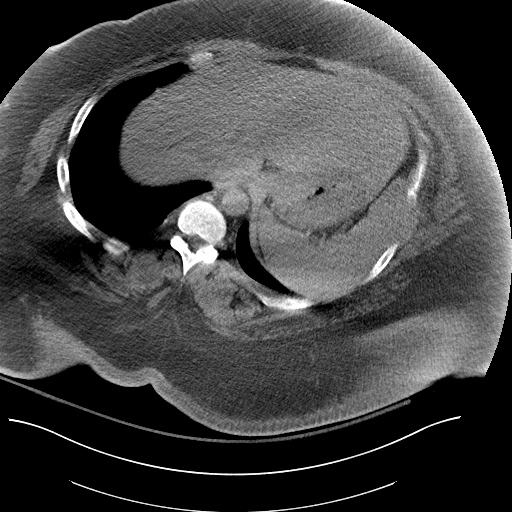
[im 83/97  lung]
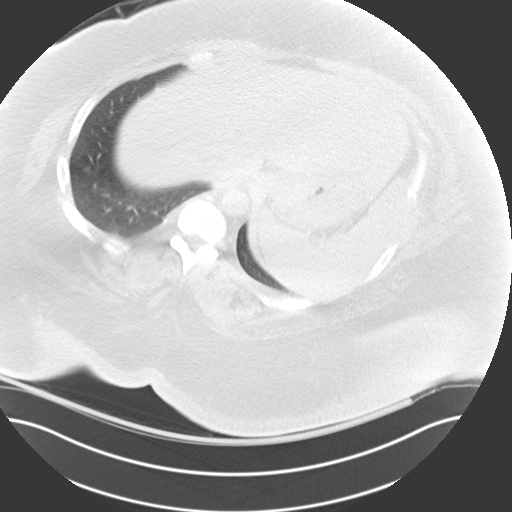
[im 83/97  bone]
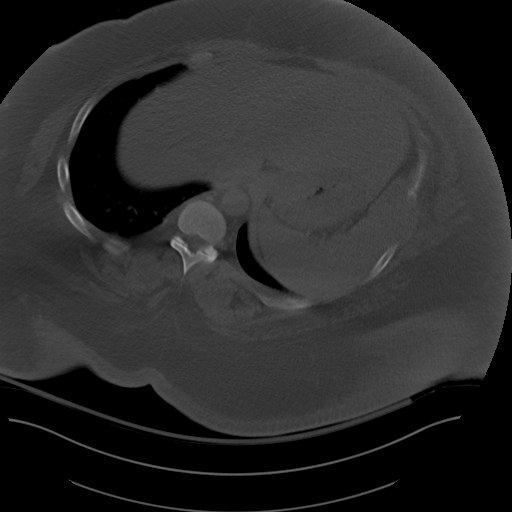
[im 90/97  soft-tissue]
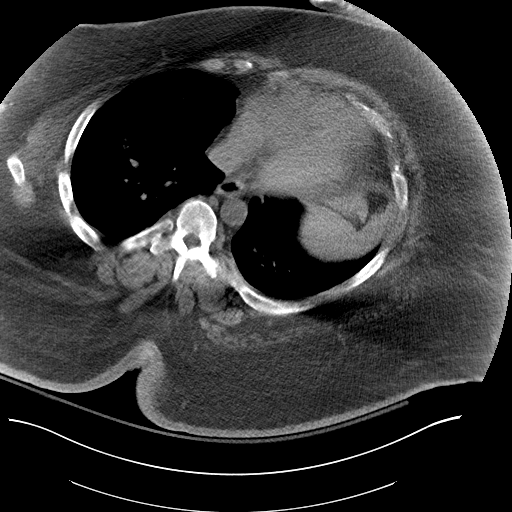
[im 90/97  lung]
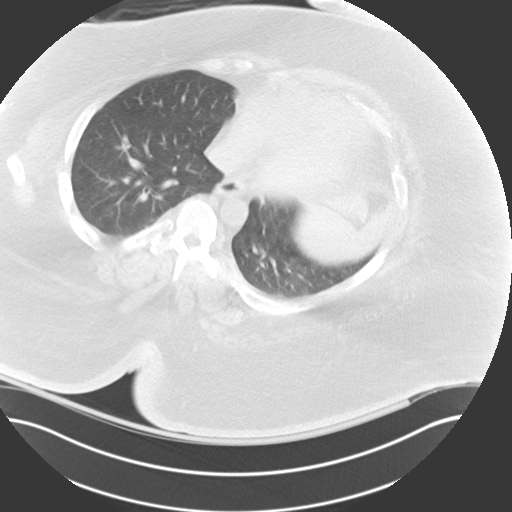

[Series 5: coronals · coronal · 1.00mm/px · 3 of 181 slices shown]
[im 46/181  soft-tissue]
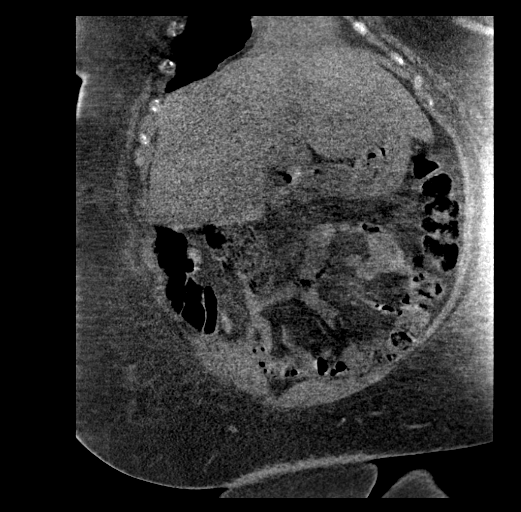
[im 91/181  soft-tissue]
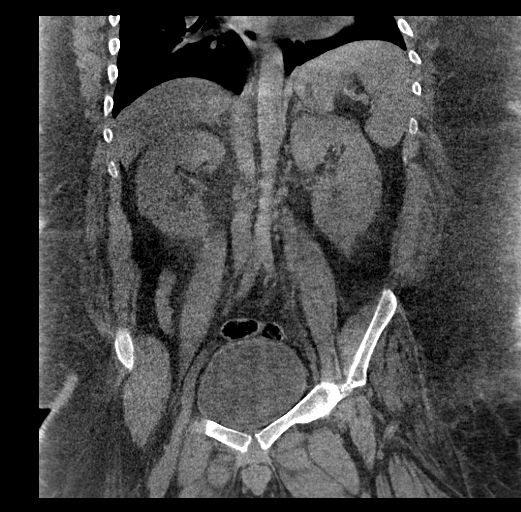
[im 136/181  soft-tissue]
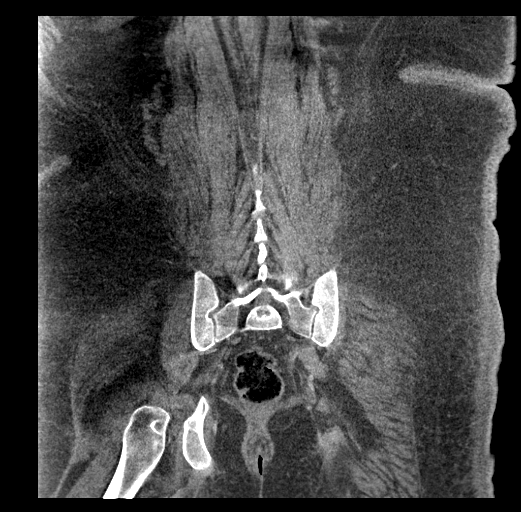

[14 of 46 positions shown; findings below may reference images not displayed]

FINDINGS: Exam is suboptimal due to body habitus lack of IV contrast and
patient motion.

Lower chest:  Lung bases are clear.  No pericardial fluid.

Hepatobiliary: Non IV contrast images demonstrate no focal hepatic
lesion.

Pancreas: Pancreas is normal. No ductal dilatation. No pancreatic
inflammation.

Spleen: Normal spleen.

Adrenals/urinary tract: No nephrolithiasis or ureterolithiasis. No
obstructive uropathy.

Stomach/Bowel: Stomach, small bowel, and colon are normal.

Vascular/Lymphatic: Abdominal aorta is normal caliber. There is no
retroperitoneal or periportal lymphadenopathy. No pelvic
lymphadenopathy. There is atherosclerotic calcification of the SMA

Reproductive: Prostate gland is normal.

Musculoskeletal: No aggressive osseous lesion.

Other: No hernia.  No free fluid or abscess
IMPRESSION: 1. Suboptimal exam.
2. No nephrolithiasis, ureterolithiasis or obstructive uropathy
identified.
3. No acute findings in the abdomen or pelvis.

## 2015-05-05 MED ORDER — FUROSEMIDE 80 MG PO TABS: 80.0000 mg | ORAL_TABLET | Freq: Every day | ORAL | Status: AC

## 2015-05-05 MED ORDER — GLIPIZIDE 5 MG PO TABS: 5.0000 mg | ORAL_TABLET | Freq: Every day | ORAL | Status: DC

## 2015-05-05 MED ORDER — HYDRALAZINE HCL 25 MG PO TABS: 25.0000 mg | ORAL_TABLET | Freq: Three times a day (TID) | ORAL | Status: DC

## 2015-05-05 MED ORDER — AMLODIPINE BESYLATE 10 MG PO TABS: ORAL_TABLET | ORAL | Status: DC

## 2015-05-05 NOTE — Assessment & Plan Note (Addendum)
Saw PA Wheelan at [facility] on 01/07/15. Not candidate for HD b/c of transportation issues and needle phobia.  They recommended continuing medical management, Cont renal diet, cont lasix/na+bicarb and phoslo.   Checked labs: crt is worsening to 5.7. K+ is up 5.5 (likely not being on lasix for last few weeks).k+ will likely improve after lasix resumption.   Will send labs to Dr. Posey Pronto as they wanted to manage him peripherally instead of bringing him to their office with ambulance ride every time per patient's family.  May benefit from Epo injection? Will defer to nephrology for this.

## 2015-05-05 NOTE — Assessment & Plan Note (Addendum)
Vitamin D deficiency in settting of secondary hyperparathyroidism with vitamin D level <4 on 12/23/14.  Was on 12 week 50k U ergocalciferol.  Recheck Vitamin D level today: low again, re-prescribed 12 weeks 50k. Called pt's mother and let her know about lab results.

## 2015-05-05 NOTE — Assessment & Plan Note (Signed)
Continue lasix 80mg  daily. May benefit from lasix or switching to diltiazem for BP and proteinuria?  Will ask nephrology for recs.

## 2015-05-05 NOTE — Progress Notes (Signed)
   Subjective:    Patient ID: Adrian Khan, male    DOB: [birth year], 39 y.o.   MRN: [id number]  HPI  39 yo male with DM II, HTN, CKD, nephrotic syndrome, iron def and CKD related anemia, here for follow up of his chronic illnesses including DM II, HTN, and nephrotic syndrome.  He is doing well today. Denies any complaint that his chronic ab swelling.   Has been out of all of his meds for 2+ weeks. BP high today.  Does not want to take any insulin, wants something PO for diabetes.  Doing Physical therapy at home.   Review of Systems  Constitutional: Positive for chills. Negative for fever and fatigue.  Eyes: Negative.   Respiratory: Negative for cough, choking, shortness of breath and wheezing.   Cardiovascular: Negative for chest pain and palpitations.  Gastrointestinal: Positive for abdominal distention. Negative for nausea, vomiting, abdominal pain and blood in stool.  Endocrine: Negative.   Genitourinary: Negative for dysuria.  Musculoskeletal: Negative for myalgias and back pain.  Skin: Negative.   Neurological: Negative.   Hematological: Negative.   Psychiatric/Behavioral: Negative.          Objective:   Physical Exam  Constitutional: He is oriented to person, place, and time. No distress.  Morbidly obese male, lying in stretcher.   HENT:  Head: Normocephalic and atraumatic.  Mouth/Throat: No oropharyngeal exudate.  Eyes: EOM are normal. Pupils are equal, round, and reactive to light. Right eye exhibits no discharge. Left eye exhibits no discharge. No scleral icterus.  Neck: Normal range of motion.  Cardiovascular: Normal rate and regular rhythm.  Exam reveals no gallop and no friction rub.   No murmur heard. Pulmonary/Chest: Effort normal and breath sounds normal. No respiratory distress. He exhibits no tenderness.  Abdominal: Soft. Bowel sounds are normal. He exhibits no distension.  Musculoskeletal: Normal range of motion.  1+ b/l leg edema.   Neurological:  He is alert and oriented to person, place, and time. No cranial nerve deficit.  Skin: He is not diaphoretic.    Filed Vitals:   05/05/15 1322  BP: 187/100  Pulse: 102  Temp: 97.6 F (36.4 C)       Assessment & Plan:  See problem based a&p.

## 2015-05-05 NOTE — Patient Instructions (Addendum)
Take glipizide 5 mg daily. Check your blood sugar 3 times a day.  Keep taking Vitamin D (take 2000 units daily - you can pick this up from over the counter).  Come back in 1 month for BP check and Diabetes follow up.  Take lasix 80mg  daily. Continue taking Hydralazine and amlodipine.

## 2015-05-05 NOTE — Assessment & Plan Note (Addendum)
Has iron deficiency and CKD related anemia. Iron panel showed high ferritin, low iron sat <20%. On PO iron already. Likely needs EPO as last hgb <10 (was 8.4 in march).   Check cbc today: hgb better today 9.8.  Cont PO iron.  Will send labs to nephrology Dr. Posey Pronto.

## 2015-05-05 NOTE — Assessment & Plan Note (Addendum)
Last hgba1c 8.6 2/16. Today 7.4 without any insulin.  Previous regimen: humalog 10 with supper, and toujeo 10 qHS but he has not been taking it as he has needle phobia.   Asking for PO diabetes medication. Switched him to glipizide 5mg  daily for now. Informed him that it may not work as well as insulin but it's better than not doing anything.  Asked to come back in 1 month with 3 daily blood sugar records.  Unable to obtain annual eye exam due to inability to fit in chair secondary to morbid boesity.

## 2015-05-05 NOTE — Assessment & Plan Note (Addendum)
Was supposed to be on lasix 80 bid, hydralazine 25mg  TID, amlodipine 10mg  daily.  States he has been out of his BP meds. I will refill his meds today. Will bring him back in 1 month for BP check. Keep on same meds for now except reduced lasix to 80mg  daily as he has been doing fine in terms of breathing without any lasix for last 2 weeks.

## 2015-05-06 LAB — VITAMIN D 25 HYDROXY (VIT D DEFICIENCY, FRACTURES): Vit D, 25-Hydroxy: 9 ng/mL — ABNORMAL LOW (ref 30–100)

## 2015-05-06 MED ORDER — VITAMIN D (ERGOCALCIFEROL) 1.25 MG (50000 UNIT) PO CAPS: 50000.0000 [IU] | ORAL_CAPSULE | ORAL | Status: DC

## 2015-05-10 NOTE — Progress Notes (Signed)
Internal Medicine Clinic Attending  Case discussed with Dr. Ahmed soon after the resident saw the patient.  We reviewed the resident's history and exam and pertinent patient test results.  I agree with the assessment, diagnosis, and plan of care documented in the resident's note. 

## 2015-06-06 ENCOUNTER — Telehealth: Payer: Self-pay | Admitting: Pharmacist

## 2015-06-06 NOTE — Telephone Encounter (Signed)
Contacted patient to offer help with medications. Left message for patient to call back.

## 2015-06-09 NOTE — Telephone Encounter (Signed)
Patient's mother is returning call. 

## 2015-06-12 NOTE — Telephone Encounter (Signed)
Contacted patient to offer help with medication management. Spoke to patient's mother who helps at home with medications. She states that patient is doing better and no help needed at the time, but would like to continue working with me in the future if medication-related concerns arise.

## 2015-06-26 ENCOUNTER — Telehealth: Payer: Self-pay | Admitting: *Deleted

## 2015-06-26 NOTE — Telephone Encounter (Signed)
NP for [employer] calls and states she made an annual visit to pt's home this am. She did a urine dipstick and it was 4+prot and 1+ glucose. She states he had no other c/o and was not c/o uti symptoms

## 2015-07-07 ENCOUNTER — Other Ambulatory Visit: Payer: Self-pay | Admitting: Internal Medicine

## 2015-07-08 NOTE — Telephone Encounter (Signed)
Patient needs to be taking Vit D 2000 units daily. The Vitamin D 50000 was only for 12 weeks.

## 2015-08-13 DIAGNOSIS — E1100 Type 2 diabetes mellitus with hyperosmolarity without nonketotic hyperglycemic-hyperosmolar coma (NKHHC): Secondary | ICD-10-CM | POA: Diagnosis not present

## 2015-08-13 DIAGNOSIS — Z89432 Acquired absence of left foot: Secondary | ICD-10-CM | POA: Diagnosis not present

## 2015-08-13 DIAGNOSIS — Z89431 Acquired absence of right foot: Secondary | ICD-10-CM | POA: Diagnosis not present

## 2015-09-13 DIAGNOSIS — E1100 Type 2 diabetes mellitus with hyperosmolarity without nonketotic hyperglycemic-hyperosmolar coma (NKHHC): Secondary | ICD-10-CM | POA: Diagnosis not present

## 2015-09-13 DIAGNOSIS — Z89432 Acquired absence of left foot: Secondary | ICD-10-CM | POA: Diagnosis not present

## 2015-09-13 DIAGNOSIS — Z89431 Acquired absence of right foot: Secondary | ICD-10-CM | POA: Diagnosis not present

## 2015-09-25 ENCOUNTER — Telehealth: Payer: Self-pay

## 2015-09-25 NOTE — Telephone Encounter (Signed)
Rec'd call from patients mother, pt present in background.   Pt requesting PCP call him in an antibiotic.  Pt has noticed marked increase in cough which he describes as allergies over the past month.  Pt has completed a box of [place] HBP at the recommendation of the pharmacist but is still with a productive cough and runny nose.  Mucus described as clear.  Pt also complaining of swelling to his left arm, denies pain.  When asked if pt can be scheduled in clinic mother describes difficulty with transportation reporting the wheelchair that pt has weighs [phone] and the public transportation handicap ramp has a weight limit of 500lbs.  Advised pt and mother that I would forward their request to PCP as well as include our social worker to see about other transportation options.  Please advise.

## 2015-09-25 NOTE — Telephone Encounter (Signed)
CSW contacted pt/pt's mother.  Pt's insurance provides transportation as a benefit.  CSW will contact [employer] to obtain the weight limit for their transportation service with wheelchair.

## 2015-09-26 ENCOUNTER — Telehealth: Payer: Self-pay | Admitting: Licensed Clinical Social Worker

## 2015-09-26 NOTE — Telephone Encounter (Signed)
CSW has confirmed transportation is available for Mr. Adrian Khan through [place] with a 3 day notice, weight limits acceptable.  CSW will notify mother/patient of information to [place] for scheduling.

## 2015-09-26 NOTE — Telephone Encounter (Signed)
CSW placed call to pt and spoke with pt's mother, Lonie Peak.  Mother states pt is current with [facility] [employer] HMO/SNP.  Pt's plan does provide medical transportation.  CSW contacting [employer] to determine if there are weight limits to transportation.  Mother states pt's wheelchair is 400lbs + plus the weight of the patient.  Mother states she was told pt has [employer] assistance that should help with transportation.

## 2015-09-26 NOTE — Telephone Encounter (Signed)
No antibiotic indicated for the symptoms described, likely viral URI. He should call back if symptoms worsen or develops new fever.

## 2015-09-26 NOTE — Telephone Encounter (Signed)
CSW placed call to pt.  Left message on home phone number stating [place] transportation is available to patient, weight of w/c plus patient provided and transportation available.  CSW provided contact number to [facility] transportation [phone].  CSW will place [facility] transportation brochure in mail to patient.  Message indicating 3 day notice will be needed and non-emergency ambulance transportation is not covered.  CSW will sign off, available as needed.

## 2015-09-26 NOTE — Telephone Encounter (Signed)
So now that pt can be seen, what is PCP perspective on his complaints/request (below)?  Earliest he can be seen due to transportation will be next Thursday.

## 2015-09-29 NOTE — Telephone Encounter (Signed)
[name] left message stating pt's current phone did not have minutes left and she was unable to retrieve any voicemails from that phone number.  Mother provided an additional contact number.  CSW returned call to mother on alternate number.  CSW discuss transportation available and provide contact number for scheduling.  Mother informed brochure mailed out on 09/26/15.

## 2015-09-29 NOTE — Telephone Encounter (Signed)
Spoke with pt to inform that PCP does not recommend antibiotics use at this time.  Pt declined to schedule an appointment, will call back to schedule if symptoms worsen or if he develops a fever.

## 2015-10-23 ENCOUNTER — Other Ambulatory Visit: Payer: Self-pay | Admitting: Internal Medicine

## 2015-10-27 ENCOUNTER — Inpatient Hospital Stay (HOSPITAL_COMMUNITY)
Admission: EM | Admit: 2015-10-27 | Discharge: 2015-11-19 | DRG: 682 | Disposition: E | Payer: Medicare Other | Attending: Internal Medicine | Admitting: Internal Medicine

## 2015-10-27 ENCOUNTER — Inpatient Hospital Stay (HOSPITAL_COMMUNITY): Payer: Medicare Other

## 2015-10-27 ENCOUNTER — Encounter: Payer: Medicare Other | Admitting: Internal Medicine

## 2015-10-27 ENCOUNTER — Emergency Department (HOSPITAL_COMMUNITY): Payer: Medicare Other

## 2015-10-27 ENCOUNTER — Telehealth: Payer: Self-pay | Admitting: *Deleted

## 2015-10-27 ENCOUNTER — Encounter: Payer: Self-pay | Admitting: Internal Medicine

## 2015-10-27 DIAGNOSIS — Z89421 Acquired absence of other right toe(s): Secondary | ICD-10-CM

## 2015-10-27 DIAGNOSIS — Z6841 Body Mass Index (BMI) 40.0 and over, adult: Secondary | ICD-10-CM | POA: Diagnosis not present

## 2015-10-27 DIAGNOSIS — J189 Pneumonia, unspecified organism: Secondary | ICD-10-CM | POA: Diagnosis present

## 2015-10-27 DIAGNOSIS — Z7401 Bed confinement status: Secondary | ICD-10-CM

## 2015-10-27 DIAGNOSIS — E8770 Fluid overload, unspecified: Secondary | ICD-10-CM | POA: Diagnosis present

## 2015-10-27 DIAGNOSIS — Z794 Long term (current) use of insulin: Secondary | ICD-10-CM

## 2015-10-27 DIAGNOSIS — Z79899 Other long term (current) drug therapy: Secondary | ICD-10-CM

## 2015-10-27 DIAGNOSIS — G629 Polyneuropathy, unspecified: Secondary | ICD-10-CM | POA: Diagnosis present

## 2015-10-27 DIAGNOSIS — N049 Nephrotic syndrome with unspecified morphologic changes: Secondary | ICD-10-CM

## 2015-10-27 DIAGNOSIS — R0902 Hypoxemia: Secondary | ICD-10-CM | POA: Diagnosis present

## 2015-10-27 DIAGNOSIS — L89302 Pressure ulcer of unspecified buttock, stage 2: Secondary | ICD-10-CM | POA: Diagnosis present

## 2015-10-27 DIAGNOSIS — Z515 Encounter for palliative care: Secondary | ICD-10-CM | POA: Insufficient documentation

## 2015-10-27 DIAGNOSIS — Z89422 Acquired absence of other left toe(s): Secondary | ICD-10-CM | POA: Diagnosis not present

## 2015-10-27 DIAGNOSIS — Z8614 Personal history of Methicillin resistant Staphylococcus aureus infection: Secondary | ICD-10-CM | POA: Diagnosis not present

## 2015-10-27 DIAGNOSIS — E1121 Type 2 diabetes mellitus with diabetic nephropathy: Secondary | ICD-10-CM | POA: Diagnosis present

## 2015-10-27 DIAGNOSIS — Z66 Do not resuscitate: Secondary | ICD-10-CM | POA: Diagnosis present

## 2015-10-27 DIAGNOSIS — R0602 Shortness of breath: Secondary | ICD-10-CM | POA: Diagnosis not present

## 2015-10-27 DIAGNOSIS — N185 Chronic kidney disease, stage 5: Secondary | ICD-10-CM | POA: Diagnosis present

## 2015-10-27 DIAGNOSIS — D72829 Elevated white blood cell count, unspecified: Secondary | ICD-10-CM | POA: Diagnosis not present

## 2015-10-27 DIAGNOSIS — N2581 Secondary hyperparathyroidism of renal origin: Secondary | ICD-10-CM | POA: Diagnosis present

## 2015-10-27 DIAGNOSIS — G4733 Obstructive sleep apnea (adult) (pediatric): Secondary | ICD-10-CM | POA: Diagnosis present

## 2015-10-27 DIAGNOSIS — R05 Cough: Secondary | ICD-10-CM | POA: Diagnosis not present

## 2015-10-27 DIAGNOSIS — E1122 Type 2 diabetes mellitus with diabetic chronic kidney disease: Secondary | ICD-10-CM | POA: Diagnosis present

## 2015-10-27 DIAGNOSIS — IMO0002 Reserved for concepts with insufficient information to code with codable children: Secondary | ICD-10-CM | POA: Diagnosis present

## 2015-10-27 DIAGNOSIS — R5381 Other malaise: Secondary | ICD-10-CM | POA: Diagnosis not present

## 2015-10-27 DIAGNOSIS — N179 Acute kidney failure, unspecified: Secondary | ICD-10-CM

## 2015-10-27 DIAGNOSIS — L8990 Pressure ulcer of unspecified site, unspecified stage: Secondary | ICD-10-CM | POA: Insufficient documentation

## 2015-10-27 DIAGNOSIS — R0600 Dyspnea, unspecified: Secondary | ICD-10-CM | POA: Diagnosis not present

## 2015-10-27 DIAGNOSIS — K219 Gastro-esophageal reflux disease without esophagitis: Secondary | ICD-10-CM | POA: Diagnosis present

## 2015-10-27 DIAGNOSIS — E1165 Type 2 diabetes mellitus with hyperglycemia: Secondary | ICD-10-CM | POA: Diagnosis present

## 2015-10-27 DIAGNOSIS — I120 Hypertensive chronic kidney disease with stage 5 chronic kidney disease or end stage renal disease: Secondary | ICD-10-CM | POA: Diagnosis present

## 2015-10-27 DIAGNOSIS — N189 Chronic kidney disease, unspecified: Secondary | ICD-10-CM

## 2015-10-27 DIAGNOSIS — E1129 Type 2 diabetes mellitus with other diabetic kidney complication: Secondary | ICD-10-CM | POA: Diagnosis present

## 2015-10-27 DIAGNOSIS — N184 Chronic kidney disease, stage 4 (severe): Secondary | ICD-10-CM | POA: Diagnosis present

## 2015-10-27 DIAGNOSIS — R918 Other nonspecific abnormal finding of lung field: Secondary | ICD-10-CM | POA: Diagnosis not present

## 2015-10-27 DIAGNOSIS — F329 Major depressive disorder, single episode, unspecified: Secondary | ICD-10-CM | POA: Diagnosis present

## 2015-10-27 DIAGNOSIS — E872 Acidosis: Secondary | ICD-10-CM | POA: Diagnosis present

## 2015-10-27 DIAGNOSIS — I129 Hypertensive chronic kidney disease with stage 1 through stage 4 chronic kidney disease, or unspecified chronic kidney disease: Secondary | ICD-10-CM | POA: Diagnosis not present

## 2015-10-27 DIAGNOSIS — E11649 Type 2 diabetes mellitus with hypoglycemia without coma: Secondary | ICD-10-CM | POA: Diagnosis not present

## 2015-10-27 DIAGNOSIS — D631 Anemia in chronic kidney disease: Secondary | ICD-10-CM | POA: Diagnosis present

## 2015-10-27 DIAGNOSIS — F419 Anxiety disorder, unspecified: Secondary | ICD-10-CM | POA: Diagnosis present

## 2015-10-27 DIAGNOSIS — I10 Essential (primary) hypertension: Secondary | ICD-10-CM | POA: Diagnosis present

## 2015-10-27 LAB — BASIC METABOLIC PANEL
ANION GAP: 18 — AB (ref 5–15)
BUN: 118 mg/dL — ABNORMAL HIGH (ref 6–20)
CALCIUM: 6 mg/dL — AB (ref 8.9–10.3)
CHLORIDE: 106 mmol/L (ref 101–111)
CO2: 8 mmol/L — AB (ref 22–32)
Creatinine, Ser: 10.72 mg/dL — ABNORMAL HIGH (ref 0.61–1.24)
GFR calc non Af Amer: 5 mL/min — ABNORMAL LOW (ref >60)
GFR, EST AFRICAN AMERICAN: 6 mL/min — AB (ref >60)
GLUCOSE: 188 mg/dL — AB (ref 65–99)
POTASSIUM: 5 mmol/L (ref 3.5–5.1)
Sodium: 132 mmol/L — ABNORMAL LOW (ref 135–145)

## 2015-10-27 LAB — CBC
HEMATOCRIT: 25.5 % — AB (ref 39.0–52.0)
HEMOGLOBIN: 7.9 g/dL — AB (ref 13.0–17.0)
MCH: 27.4 pg (ref 26.0–34.0)
MCHC: 31 g/dL (ref 30.0–36.0)
MCV: 88.5 fL (ref 78.0–100.0)
Platelets: 278 10*3/uL (ref 150–400)
RBC: 2.88 MIL/uL — AB (ref 4.22–5.81)
RDW: 15.1 % (ref 11.5–15.5)
WBC: 21.3 10*3/uL — ABNORMAL HIGH (ref 4.0–10.5)

## 2015-10-27 LAB — HEPATIC FUNCTION PANEL
ALBUMIN: 2.6 g/dL — AB (ref 3.5–5.0)
ALK PHOS: 126 U/L (ref 38–126)
ALT: 8 U/L — ABNORMAL LOW (ref 17–63)
AST: 10 U/L — AB (ref 15–41)
Bilirubin, Direct: 0.2 mg/dL (ref 0.1–0.5)
Indirect Bilirubin: 0.3 mg/dL (ref 0.3–0.9)
TOTAL PROTEIN: 8 g/dL (ref 6.5–8.1)
Total Bilirubin: 0.5 mg/dL (ref 0.3–1.2)

## 2015-10-27 LAB — BRAIN NATRIURETIC PEPTIDE: B NATRIURETIC PEPTIDE 5: 131.6 pg/mL — AB (ref 0.0–100.0)

## 2015-10-27 LAB — TROPONIN I: Troponin I: 0.04 ng/mL — ABNORMAL HIGH (ref <0.031)

## 2015-10-27 LAB — MAGNESIUM: Magnesium: 1.6 mg/dL — ABNORMAL LOW (ref 1.7–2.4)

## 2015-10-27 MED ORDER — SODIUM BICARBONATE 650 MG PO TABS
1300.0000 mg | ORAL_TABLET | Freq: Three times a day (TID) | ORAL | Status: DC
  Administered 2015-10-28 – 2015-10-29 (×4): 1300 mg via ORAL
  Filled 2015-10-27 (×7): qty 2

## 2015-10-27 MED ORDER — SODIUM BICARBONATE 650 MG PO TABS: 1300.0000 mg | ORAL_TABLET | Freq: Three times a day (TID) | ORAL | Status: DC

## 2015-10-27 MED ORDER — CALCIUM ACETATE (PHOS BINDER) 667 MG PO CAPS
667.0000 mg | ORAL_CAPSULE | Freq: Three times a day (TID) | ORAL | Status: DC
  Filled 2015-10-27 (×4): qty 1

## 2015-10-27 MED ORDER — LEVOFLOXACIN IN D5W 750 MG/150ML IV SOLN
750.0000 mg | INTRAVENOUS | Status: DC
  Administered 2015-10-27: 750 mg via INTRAVENOUS
  Filled 2015-10-27: qty 150

## 2015-10-27 MED ORDER — GUAIFENESIN 100 MG/5ML PO SOLN
5.0000 mL | Freq: Four times a day (QID) | ORAL | Status: DC | PRN
  Filled 2015-10-27: qty 5

## 2015-10-27 MED ORDER — INSULIN ASPART 100 UNIT/ML ~~LOC~~ SOLN: 0.0000 [IU] | Freq: Three times a day (TID) | SUBCUTANEOUS | Status: DC

## 2015-10-27 MED ORDER — LEVOFLOXACIN IN D5W 500 MG/100ML IV SOLN: 500.0000 mg | INTRAVENOUS | Status: DC

## 2015-10-27 MED ORDER — INSULIN ASPART 100 UNIT/ML ~~LOC~~ SOLN: 0.0000 [IU] | Freq: Every day | SUBCUTANEOUS | Status: DC

## 2015-10-27 NOTE — ED Notes (Signed)
Pt refused labs, informed by [employer]

## 2015-10-27 NOTE — ED Notes (Signed)
Pt arrives via [facility]. Pt has c/o SOB since rolling over to his left side today. Pt hasn't walked in over 2 years. Edema noted to left side, and ascites noted in abdomen.

## 2015-10-27 NOTE — ED Notes (Signed)
Pt refuses ABG. Pt informed of the importance of this test.

## 2015-10-27 NOTE — Progress Notes (Signed)
Pt refusing ABG at this time. Explained the benefits of it but pt still refusing.

## 2015-10-27 NOTE — Telephone Encounter (Signed)
Mother calls and states that the transport service stated today that it would not be safe - road conditions- to bring pt to appt, she states they examined him and pt does have congestion in his lungs and probably needs abx. She would like for abx to be called in based on that exam, she was informed that a physician would need to see pt and examine him to decide if abx were needed. appt was made for pt and mother was encouraged to call 911 for transport to ED

## 2015-10-27 NOTE — ED Notes (Signed)
Pt c/o being hot, blankets removed and bearhugger removed from patient.

## 2015-10-27 NOTE — H&P (Signed)
Date: 11/15/2015               Patient Name:  Adrian Khan MRN: [id number]  DOB: 11/10/1975 Age / Sex: 40 y.o., male   PCP: Dellia Nims, MD         Medical Service: Internal Medicine Teaching Service         Attending Physician: Dr. Aldine Contes, MD    First Contact: Dr. Burgess Estelle Pager: [phone]  Second Contact: Dr. Julious Oka Pager: [phone]       After Hours (After 5p/  First Contact Pager: [phone]  weekends / holidays): Second Contact Pager: [phone]   Chief Complaint: Productive cough, shortness of breath, worsening leg swelling  History of Present Illness: Adrian Khan is a 40yo M who is permanently bedridden being ~550 lbs with insulin-dependent Type 2 DM, nephrotic syndrome in setting of CKD Stage 4, hypertension, anxiety/depression, and OSA who presents with progressively worsening cough productive of greenish sputum and shortness of breath over the past few weeks. He says at the beginning of December, he had productive cough, chest congestion, short throat, and rhinorrhea which lasted ~2 weeks and finally went away with the help of Chloracedin. He says he felt well for a week or so, and then the same symptoms came back and have progressively worsened over the past couple of weeks. He also says that his legs have been more swollen and making less urine than usual over the past 2 weeks, he has felt generalized malaise, post-tussive emesis x 1-2 times this week as well as generalized post-tussive myalgias. This morning he felt a bit lightheaded when he was turning to use the bedpan. He denies fevers, chest pain, palpitations, wheezing, abdominal pain, bowel changes, new rashes, or other symptoms currently.   Meds: Current Facility-Administered Medications  Medication Dose Route Frequency Provider Last Rate Last Dose  . [START ON 10/29/2015] levofloxacin (LEVAQUIN) IVPB 500 mg  500 mg Intravenous Q48H Otilio Miu, RPH      . levofloxacin [facility]) IVPB 750 mg  750  mg Intravenous Q24H Rebecka Apley, RPH 100 mL/hr at 11/07/2015 2032 750 mg at 11/17/2015 2032   Current Outpatient Prescriptions  Medication Sig Dispense Refill  . acetaminophen (TYLENOL) 500 MG tablet Take 500 mg by mouth every 6 (six) hours as needed for mild pain.    Marland Kitchen amLODipine (NORVASC) 10 MG tablet TAKE 1 TABLET (10 MG TOTAL) BY MOUTH DAILY. 30 tablet 3  . furosemide (LASIX) 80 MG tablet Take 1 tablet (80 mg total) by mouth daily. 30 tablet 3  . glipiZIDE (GLUCOTROL) 5 MG tablet Take 1 tablet (5 mg total) by mouth daily before breakfast. 30 tablet 3  . hydrALAZINE (APRESOLINE) 25 MG tablet Take 1 tablet (25 mg total) by mouth 3 (three) times daily. (Patient taking differently: Take 25 mg by mouth daily. ) 90 tablet 2  . Vitamin D, Ergocalciferol, (DRISDOL) 50000 UNITS CAPS capsule Take 1 capsule (50,000 Units total) by mouth every 7 (seven) days. 12 capsule 0  . B-D ULTRAFINE III SHORT PEN 31G X 8 MM MISC USE AS DIRECTED WITH FLEXPEN 100 each 0  . Blood Glucose Monitoring Suppl (ONE TOUCH ULTRA MINI) W/DEVICE KIT Check blood sugar one time a day 1 each 1  . calcium acetate (PHOSLO) 667 MG capsule TAKE 3 CAPSULES (2,001 MG TOTAL) BY MOUTH 3 (THREE) TIMES DAILY WITH MEALS. (Patient not taking: Reported on 11/07/2015) 90 capsule 0  . ferrous gluconate (FERGON) 324 MG tablet Take  1 tablet (324 mg total) by mouth 2 (two) times daily with a meal. (Patient not taking: Reported on 11/17/2015) 60 tablet 3  . fluticasone (FLONASE) 50 MCG/ACT nasal spray Place 1 spray into both nostrils daily. (Patient not taking: Reported on 11/09/2015) 16 g 2  . nystatin (MYCOSTATIN/NYSTOP) 100000 UNIT/GM POWD Apply to affected areas on the lower abdomen/groin area twice daily. Keep skin dry. (Patient not taking: Reported on 12/24/2014) 30 g 0  . ONE TOUCH ULTRA TEST test strip CHECK BLOOD SUGAR ONE TIME A DAY 100 each 2  . ONETOUCH DELICA LANCETS FINE MISC Check blood sugar one time a day 100 each 5  . sodium bicarbonate 650 MG  tablet TAKE 2 TABLETS BY MOUTH 3 TIMES A DAY (Patient not taking: Reported on 10/31/2015) 120 tablet 0    Allergies: Allergies as of 10/31/2015 - Review Complete 10/20/2015  Allergen Reaction Noted  . Sulfonamide derivatives Other (See Comments)   . Rocephin [ceftriaxone] Hives and Itching 10/04/2014   Past Medical History  Diagnosis Date  . Shortness of breath     with exertion  . Anxiety   . Mental disorder   . Depression   . GERD (gastroesophageal reflux disease)   . Constipation   . Neuropathy (HCC)     feet, hands  . Obesity   . Hx MRSA infection   . Sleep apnea     "does not wear mask" (12/24/2014)  . History of blood transfusion     "related to foot surgery"  . History of stomach ulcers   . Septic arthritis of shoulder, left ([place])   . Chronic kidney disease     "here w/kidney problems today" (12/24/2014)   Past Surgical History  Procedure Laterality Date  . I&d extremity  12/22/2011    Procedure: IRRIGATION AND DEBRIDEMENT EXTREMITY;  Surgeon: Sharmon Revere, MD;  Location: [place];  Service: Orthopedics;  Laterality: Left;  . I&d extremity  12/24/2011    Procedure: IRRIGATION AND DEBRIDEMENT EXTREMITY;  Surgeon: Sharmon Revere, MD;  Location: [place];  Service: Orthopedics;  Laterality: Left;  Irrigation and debridement left foot.  . I&d extremity  12/26/2011    Procedure: IRRIGATION AND DEBRIDEMENT EXTREMITY;  Surgeon: Sharmon Revere, MD;  Location: [place];  Service: Orthopedics;  Laterality: Left;  . I&d extremity  12/28/2011    Procedure: IRRIGATION AND DEBRIDEMENT EXTREMITY;  Surgeon: Sharmon Revere, MD;  Location: [place];  Service: Orthopedics;  Laterality: Left;  I&D Left Foot;Application of  Wound Vac.to dorsal and plantar areas of left foot  . Eye surgery Bilateral     "bleeding behind my eyes"  . Toe surgery Right     Bone removed- 2 toe right  . Wisdom tooth extraction    . Amputation  09/22/2012    Procedure: AMPUTATION FOOT;  Surgeon: Newt Minion, MD;  Location:  [place];  Service: Orthopedics;  Laterality: Left;  Left Midfoot Amputation  . Shoulder arthroscopy Left 08/18/2013    Procedure: ARTHROSCOPY SHOULDER and subacromial decompresion and debreidment of abcess;  Surgeon: Newt Minion, MD;  Location: [place];  Service: Orthopedics;  Laterality: Left;  . Amputation Right 09/19/2013    Procedure: AMPUTATION RAY- right;  Surgeon: Newt Minion, MD;  Location: [place];  Service: Orthopedics;  Laterality: Right;  Right Foot 5th Ray Amputation  . Foot fracture surgery Bilateral 2000's   History reviewed. No pertinent family history. Social History   Social History  . Marital Status:  Single    Spouse Name: N/A  . Number of Children: N/A  . Years of Education: N/A   Occupational History  . Not on file.   Social History Main Topics  . Smoking status: Never Smoker   . Smokeless tobacco: Never Used  . Alcohol Use: No  . Drug Use: No  . Sexual Activity: Not Currently   Other Topics Concern  . Not on file   Social History Narrative    Review of Systems: Pertinent items noted in HPI and remainder of comprehensive ROS otherwise negative.  Physical Exam: Blood pressure 140/72, pulse 86, temperature 93.9 F (34.4 C), temperature source Rectal, resp. rate 26, height _0  (1.803 m), weight 545 lb (247.21 kg), SpO2 96 %.   Gen: Well-appearing, alert and oriented to person, place, and time HEENT: Oropharynx clear without erythema or exudate. No maxillary or frontal sinus tenderness.  Neck: No cervical LAD, no thyromegaly or nodules, no JVD noted. CV: Normal rate, regular rhythm, no murmurs, rubs, or gallops, heart sounds distant Pulmonary: Normal effort, CTA bilaterally, no wheezing, rales, or rhonchi but exam severely limited by gigantic body habitus Abdominal: Soft, non-tender, non-distended, without rebound, guarding, or masses Extremities: Distal pulses 2+ in upper and lower extremities bilaterally, no tenderness or erythema, 2+ LE edema bilaterally  to mid-calf. S/p LLE digit amputations. Skin: No atypical appearing moles. No rashes  Lab results: Basic Metabolic Panel:  Recent Labs  11/06/2015 1755  NA 132*  K 5.0  CL 106  CO2 8*  GLUCOSE 188*  BUN 118*  CREATININE 10.72*  CALCIUM 6.0*   CBC:  Recent Labs  11/01/2015 1755  WBC 21.3*  HGB 7.9*  HCT 25.5*  MCV 88.5  PLT 278   Cardiac Enzymes:  Recent Labs  10/19/2015 1755  TROPONINI 0.04*   Imaging results:  Dg Chest Port 1 View  11/16/2015  CLINICAL DATA:  Shortness of breath. EXAM: PORTABLE CHEST 1 VIEW COMPARISON:  October 03, 2014. FINDINGS: Mild cardiomegaly is noted. No pneumothorax or significant pleural effusion is noted. Mild diffuse airspace opacities are noted which may represent edema, inflammation or possibly overlying soft tissue artifact due to body habitus. Bony thorax is unremarkable. IMPRESSION: Mild diffuse airspace opacities are noted which may represent pneumonia, edema or possibly overlying soft tissue artifact due to body habitus. Electronically Signed   By: Marijo Conception, M.D.   On: 11/17/2015 18:26   Other results: EKG: normal sinus rhythm, RAD, decreased T-wave amplitude  Assessment & Plan by Problem: Active Problems:   Renal failure 1. Acute on chronic kidney disease w/ metabolic acidosis - h/o nephrotic syndrome in setting of stage 4 CKD, so far not deemed a dialysis candidate. Worsening SOB, leg swelling, decreased UOP x 2 wks. Labs show Cr 10.7 (baseline ~5), AG 18, HCO3 8, BNP 131. He has not been taking his renal supplements at home. Appears to have gained ~90 lbs in the past year. Likely volume overload, AGMA from worsening renal failure.  -Furosemide 18m IV (takes 80 PO at home); may need to up-titrate -Re-start home phoslo, NaHCO3 -Trend BMP, CBC, trops -F/u UA -Daily weights, ins and outs -Follow nephrology recs -Tele  2. CAP - 4-5 week history of viral URI-like symptoms with initial improvement, followed by worsening course  over the past 1-2 weeks. Still has URI like symptoms, afebrile, did present with hypoxia and WBC 21, CXR poor quality due to body habitus, PNA vs edema. Allergic to rocephin. -Levaquin IV -Trend CBCs, BMPs -  F/u cultures urinary antigens -Robitussin PRN   3. HTN  -Cont home amlodipine -Continue home hydralazine; need to clarify dosing (says he takes once daily but rx is TID)  PPX - DVT: heparin  Dispo: Disposition is deferred at this time, awaiting improvement of current medical problems. Anticipated discharge in approximately 1-3 day(s).   The patient does have a current PCP (Tasrif Ahmed, MD) and does need an [facility] hospital follow-up appointment after discharge.  The patient does have transportation limitations that hinder transportation to clinic appointments.  Signed: Norval Gable, MD 11/05/2015, 9:10 PM

## 2015-10-27 NOTE — ED Provider Notes (Addendum)
CSN: [id number]     Arrival date & time 11/07/2015  1721 History   None    Chief Complaint  Patient presents with  . Shortness of Breath   HPI Patient is a 40 year old male that is morbidly obese weighing well over 400 pounds.  The patient has been bedridden for a couple of years now. Patient started having trouble with cough and congestion a few days ago. He has had sinus congestion bringing up mucus.   He has not measured a fever. Today when rolling onto the left side he felt more acutely short of breath. She experienced some pain in his chest. The patient was supposed to go to his doctor's office today but the ambulance service was not able to transport him because of the weather. The patient was brought in by [facility]. Past Medical History  Diagnosis Date  . Shortness of breath     with exertion  . Anxiety   . Mental disorder   . Depression   . GERD (gastroesophageal reflux disease)   . Constipation   . Neuropathy (HCC)     feet, hands  . Obesity   . Hx MRSA infection   . Sleep apnea     "does not wear mask" (12/24/2014)  . History of blood transfusion     "related to foot surgery"  . History of stomach ulcers   . Septic arthritis of shoulder, left ([place])   . Chronic kidney disease     "here w/kidney problems today" (12/24/2014)   Past Surgical History  Procedure Laterality Date  . I&d extremity  12/22/2011    Procedure: IRRIGATION AND DEBRIDEMENT EXTREMITY;  Surgeon: Sharmon Revere, MD;  Location: [place];  Service: Orthopedics;  Laterality: Left;  . I&d extremity  12/24/2011    Procedure: IRRIGATION AND DEBRIDEMENT EXTREMITY;  Surgeon: Sharmon Revere, MD;  Location: [place];  Service: Orthopedics;  Laterality: Left;  Irrigation and debridement left foot.  . I&d extremity  12/26/2011    Procedure: IRRIGATION AND DEBRIDEMENT EXTREMITY;  Surgeon: Sharmon Revere, MD;  Location: [place];  Service: Orthopedics;  Laterality: Left;  . I&d extremity  12/28/2011    Procedure: IRRIGATION  AND DEBRIDEMENT EXTREMITY;  Surgeon: Sharmon Revere, MD;  Location: [place];  Service: Orthopedics;  Laterality: Left;  I&D Left Foot;Application of  Wound Vac.to dorsal and plantar areas of left foot  . Eye surgery Bilateral     "bleeding behind my eyes"  . Toe surgery Right     Bone removed- 2 toe right  . Wisdom tooth extraction    . Amputation  09/22/2012    Procedure: AMPUTATION FOOT;  Surgeon: Newt Minion, MD;  Location: [place];  Service: Orthopedics;  Laterality: Left;  Left Midfoot Amputation  . Shoulder arthroscopy Left 08/18/2013    Procedure: ARTHROSCOPY SHOULDER and subacromial decompresion and debreidment of abcess;  Surgeon: Newt Minion, MD;  Location: [place];  Service: Orthopedics;  Laterality: Left;  . Amputation Right 09/19/2013    Procedure: AMPUTATION RAY- right;  Surgeon: Newt Minion, MD;  Location: [place];  Service: Orthopedics;  Laterality: Right;  Right Foot 5th Ray Amputation  . Foot fracture surgery Bilateral 2000's   History reviewed. No pertinent family history. Social History  Substance Use Topics  . Smoking status: Never Smoker   . Smokeless tobacco: Never Used  . Alcohol Use: No    Review of Systems  All other systems reviewed and are negative.  Allergies  Sulfonamide derivatives and Rocephin  Home Medications   Prior to Admission medications   Medication Sig Start Date End Date Taking? Authorizing Provider  acetaminophen (TYLENOL) 500 MG tablet Take 500 mg by mouth every 6 (six) hours as needed for mild pain.   Yes Historical Provider, MD  amLODipine (NORVASC) 10 MG tablet TAKE 1 TABLET (10 MG TOTAL) BY MOUTH DAILY. 05/05/15  Yes Tasrif Ahmed, MD  furosemide (LASIX) 80 MG tablet Take 1 tablet (80 mg total) by mouth daily. 05/05/15  Yes Tasrif Ahmed, MD  glipiZIDE (GLUCOTROL) 5 MG tablet Take 1 tablet (5 mg total) by mouth daily before breakfast. 05/05/15 05/04/16 Yes Tasrif Ahmed, MD  hydrALAZINE (APRESOLINE) 25 MG tablet Take 1 tablet (25 mg total)  by mouth 3 (three) times daily. Patient taking differently: Take 25 mg by mouth daily.  05/05/15  Yes Tasrif Ahmed, MD  Vitamin D, Ergocalciferol, (DRISDOL) 50000 UNITS CAPS capsule Take 1 capsule (50,000 Units total) by mouth every 7 (seven) days. 05/06/15  Yes Tasrif Ahmed, MD  B-D ULTRAFINE III SHORT PEN 31G X 8 MM MISC USE AS DIRECTED WITH FLEXPEN 11/14/14   Tasrif Ahmed, MD  Blood Glucose Monitoring Suppl (ONE TOUCH ULTRA MINI) W/DEVICE KIT Check blood sugar one time a day 10/01/14   Otho Bellows, MD  calcium acetate (PHOSLO) 667 MG capsule TAKE 3 CAPSULES (2,001 MG TOTAL) BY MOUTH 3 (THREE) TIMES DAILY WITH MEALS. Patient not taking: Reported on 11/15/2015 03/10/15   Dellia Nims, MD  ferrous gluconate (FERGON) 324 MG tablet Take 1 tablet (324 mg total) by mouth 2 (two) times daily with a meal. Patient not taking: Reported on 11/04/2015 12/27/14   Luan Moore, MD  fluticasone [facility]) 50 MCG/ACT nasal spray Place 1 spray into both nostrils daily. Patient not taking: Reported on 11/05/2015 10/07/14   Luan Moore, MD  nystatin (MYCOSTATIN/NYSTOP) 100000 UNIT/GM POWD Apply to affected areas on the lower abdomen/groin area twice daily. Keep skin dry. Patient not taking: Reported on 12/24/2014 10/16/14   Blain Pais, MD  ONE TOUCH ULTRA TEST test strip CHECK BLOOD SUGAR ONE TIME A DAY 10/24/15   Dellia Nims, MD  [facility] DELICA LANCETS FINE MISC Check blood sugar one time a day 10/01/14   Otho Bellows, MD  sodium bicarbonate 650 MG tablet TAKE 2 TABLETS BY MOUTH 3 TIMES A DAY Patient not taking: Reported on 11/13/2015 07/08/15   Tasrif Ahmed, MD   BP 120/54 mmHg  Pulse 78  Temp(Src) 93.9 F (34.4 C) (Rectal)  Resp 32  Ht 5' 11"  (1.803 m)  Wt 247.21 kg  BMI 76.05 kg/m2  SpO2 87% Physical Exam  Constitutional:  Morbidly obese  HENT:  Head: Normocephalic and atraumatic.  Right Ear: External ear normal.  Left Ear: External ear normal.  Eyes: Conjunctivae are normal. Right eye exhibits  no discharge. Left eye exhibits no discharge. No scleral icterus.  Neck: Neck supple. No tracheal deviation present.  Cardiovascular: Normal rate, regular rhythm and intact distal pulses.   Pulmonary/Chest: Breath sounds normal. No stridor. Tachypnea noted. No respiratory distress. He has no wheezes. He has no rales.  Examination is difficult no gross wheezes or crackles  Abdominal: Soft. Bowel sounds are normal. He exhibits no distension. There is no tenderness. There is no rebound and no guarding.  Musculoskeletal: He exhibits edema. He exhibits no tenderness.  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 He has normal strength. No cranial nerve deficit (no facial droop, extraocular movements intact, no slurred speech) or sensory deficit. He exhibits normal muscle tone. He displays no seizure activity. Coordination normal.  Skin: Skin is warm and dry. No rash noted. He is not diaphoretic.  Psychiatric: He has a normal mood and affect.  Nursing note and vitals reviewed.   ED Course  Procedures (including critical care time) Labs Review Labs Reviewed  BASIC METABOLIC PANEL - Abnormal; Notable for the following:    Sodium 132 (*)    CO2 8 (*)    Glucose, Bld 188 (*)    BUN 118 (*)    Creatinine, Ser 10.72 (*)    Calcium 6.0 (*)    GFR calc non Af Amer 5 (*)    GFR calc Af Amer 6 (*)    Anion gap 18 (*)    All other components within normal limits  CBC - Abnormal; Notable for the following:    WBC 21.3 (*)    RBC 2.88 (*)    Hemoglobin 7.9 (*)    HCT 25.5 (*)    All other components within normal limits  BRAIN NATRIURETIC PEPTIDE - Abnormal; Notable for the following:    B Natriuretic Peptide 131.6 (*)    All other components within normal limits  TROPONIN I - Abnormal; Notable for the following:     Troponin I 0.04 (*)    All other components within normal limits    Imaging Review Dg Chest Port 1 View  11/08/2015  CLINICAL DATA:  Shortness of breath. EXAM: PORTABLE CHEST 1 VIEW COMPARISON:  October 03, 2014. FINDINGS: Mild cardiomegaly is noted. No pneumothorax or significant pleural effusion is noted. Mild diffuse airspace opacities are noted which may represent edema, inflammation or possibly overlying soft tissue artifact due to body habitus. Bony thorax is unremarkable. IMPRESSION: Mild diffuse airspace opacities are noted which may represent pneumonia, edema or possibly overlying soft tissue artifact due to body habitus. Electronically Signed   By: Marijo Conception, M.D.   On: 11/10/2015 18:26   I have personally reviewed and evaluated these images and lab results as part of my medical decision-making.   EKG Interpretation   Date/Time:  Monday October 27 2015 17:30:22 EST Ventricular Rate:  86 PR Interval:  156 QRS Duration: 109 QT Interval:  417 QTC Calculation: 499 R Axis:   102 Text Interpretation:  Sinus rhythm Right axis deviation Borderline  prolonged QT interval , new since last tracing decreased t wave amplitude  since last tracing Confirmed by Perla Echavarria  MD-J, Jezebel Pollet ([id number]) on 10/20/2015  5:53:37 PM       MDM   Final diagnoses:  Acute renal failure, unspecified acute renal failure type (HCC)   The patient's laboratory tests show acute on chronic renal failure with a metabolic acidosis. Stricture a shows the possibility of pneumonia versus edema. The patient's morbid obesity makes this difficult to assess.  I'll start the patient on IV antibiotics for community-acquired pneumonia. Patient has not been in the hospital for the last 3 months. He is allergic to Rocephin. I will consult with pharmacy regarding Levaquin dosing.  I spoke with Dr. Darius Footman, nephrology and asked him to consult.  I will speak with the IM service for admission.    Dorie Rank, MD 11/14/2015  1931  Discussed with Dr Deterding.  Pt is not  a candidate for dialysis for several reasons.Marland Kitchen  He reviewed his record and this has been discussed with the patient in the past with the patient and his mother.    Dorie Rank, MD 11/07/2015 2006

## 2015-10-27 NOTE — Progress Notes (Signed)
ANTIBIOTIC CONSULT NOTE - INITIAL  Pharmacy Consult for Levaquin Indication: CAP  Allergies  Allergen Reactions  . Sulfonamide Derivatives Other (See Comments)    cramps  . Rocephin [Ceftriaxone] Hives and Itching    Patient Measurements: Height: 5\' 11"  (180.3 cm) Weight: (!) 545 lb (247.21 kg) IBW/kg (Calculated) : 75.3 Adjusted Body Weight: 144.06 kg  Vital Signs: Temp: 93.9 F (34.4 C) (01/09 1912) Temp Source: Rectal (01/09 1912) BP: 117/54 mmHg (01/09 1930) Pulse Rate: 81 (01/09 1930) Intake/Output from previous day:   Intake/Output from this shift:    Labs:  Recent Labs  10/22/2015 1755  WBC 21.3*  HGB 7.9*  PLT 278  CREATININE 10.72*   Estimated Creatinine Clearance: 19.2 mL/min (by C-G formula based on Cr of 10.72). No results for input(s): VANCOTROUGH, VANCOPEAK, VANCORANDOM, GENTTROUGH, GENTPEAK, GENTRANDOM, TOBRATROUGH, TOBRAPEAK, TOBRARND, AMIKACINPEAK, AMIKACINTROU, AMIKACIN in the last 72 hours.   Microbiology: No results found for this or any previous visit (from the past 720 hour(s)).  Medical History: Past Medical History  Diagnosis Date  . Shortness of breath     with exertion  . Anxiety   . Mental disorder   . Depression   . GERD (gastroesophageal reflux disease)   . Constipation   . Neuropathy (HCC)     feet, hands  . Obesity   . Hx MRSA infection   . Sleep apnea     "does not wear mask" (12/24/2014)  . History of blood transfusion     "related to foot surgery"  . History of stomach ulcers   . Septic arthritis of shoulder, left ([place])   . Chronic kidney disease     "here w/kidney problems today" (12/24/2014)    Medications:   (Not in a hospital admission) Scheduled:   Infusions:  . levofloxacin (LEVAQUIN) IV     Assessment: 40yo male w/ hx of morbid obesity, bedridden for a couple of years. Presents w/ cough, sinus congestion, and some pain in his chest. Pharmacy consulted to dose Levaquin for CAP. Temp 93.9, WBC 21.3, sCr  10.72  Levaquin dose 750mg  IV given in ED  Goal of Therapy:  Eradication of infection  Plan:  Continue with levaquin 500mg  IV q48h Measure antibiotic drug levels at steady state Follow up culture results  Georgiann Mohs 10/24/2015,7:50 PM   Agree with above assessment and plan.  Nena Jordan, PharmD, BCPS 10/26/2014, 8:37 PM

## 2015-10-28 ENCOUNTER — Inpatient Hospital Stay (HOSPITAL_COMMUNITY): Payer: Medicare Other

## 2015-10-28 DIAGNOSIS — D72829 Elevated white blood cell count, unspecified: Secondary | ICD-10-CM

## 2015-10-28 DIAGNOSIS — E872 Acidosis: Secondary | ICD-10-CM

## 2015-10-28 DIAGNOSIS — R05 Cough: Secondary | ICD-10-CM

## 2015-10-28 DIAGNOSIS — L8990 Pressure ulcer of unspecified site, unspecified stage: Secondary | ICD-10-CM | POA: Insufficient documentation

## 2015-10-28 DIAGNOSIS — N184 Chronic kidney disease, stage 4 (severe): Secondary | ICD-10-CM

## 2015-10-28 DIAGNOSIS — N179 Acute kidney failure, unspecified: Principal | ICD-10-CM

## 2015-10-28 DIAGNOSIS — Z794 Long term (current) use of insulin: Secondary | ICD-10-CM

## 2015-10-28 DIAGNOSIS — R0602 Shortness of breath: Secondary | ICD-10-CM

## 2015-10-28 DIAGNOSIS — R0600 Dyspnea, unspecified: Secondary | ICD-10-CM

## 2015-10-28 DIAGNOSIS — Z6841 Body Mass Index (BMI) 40.0 and over, adult: Secondary | ICD-10-CM

## 2015-10-28 DIAGNOSIS — I120 Hypertensive chronic kidney disease with stage 5 chronic kidney disease or end stage renal disease: Secondary | ICD-10-CM

## 2015-10-28 DIAGNOSIS — R918 Other nonspecific abnormal finding of lung field: Secondary | ICD-10-CM

## 2015-10-28 DIAGNOSIS — E1122 Type 2 diabetes mellitus with diabetic chronic kidney disease: Secondary | ICD-10-CM

## 2015-10-28 LAB — RENAL FUNCTION PANEL
ANION GAP: 17 — AB (ref 5–15)
Albumin: 2.3 g/dL — ABNORMAL LOW (ref 3.5–5.0)
BUN: 119 mg/dL — ABNORMAL HIGH (ref 6–20)
CHLORIDE: 107 mmol/L (ref 101–111)
CO2: 10 mmol/L — AB (ref 22–32)
Calcium: 5.7 mg/dL — CL (ref 8.9–10.3)
Creatinine, Ser: 10.94 mg/dL — ABNORMAL HIGH (ref 0.61–1.24)
GFR, EST AFRICAN AMERICAN: 6 mL/min — AB (ref >60)
GFR, EST NON AFRICAN AMERICAN: 5 mL/min — AB (ref >60)
Glucose, Bld: 106 mg/dL — ABNORMAL HIGH (ref 65–99)
POTASSIUM: 5.1 mmol/L (ref 3.5–5.1)
Phosphorus: 9.8 mg/dL — ABNORMAL HIGH (ref 2.5–4.6)
Sodium: 134 mmol/L — ABNORMAL LOW (ref 135–145)

## 2015-10-28 LAB — CBC WITH DIFFERENTIAL/PLATELET
Basophils Absolute: 0 10*3/uL (ref 0.0–0.1)
Basophils Relative: 0 %
Eosinophils Absolute: 0.1 10*3/uL (ref 0.0–0.7)
Eosinophils Relative: 0 %
HEMATOCRIT: 22.4 % — AB (ref 39.0–52.0)
HEMOGLOBIN: 7.1 g/dL — AB (ref 13.0–17.0)
LYMPHS ABS: 1 10*3/uL (ref 0.7–4.0)
Lymphocytes Relative: 5 %
MCH: 27.8 pg (ref 26.0–34.0)
MCHC: 31.7 g/dL (ref 30.0–36.0)
MCV: 87.8 fL (ref 78.0–100.0)
MONOS PCT: 6 %
Monocytes Absolute: 1.1 10*3/uL — ABNORMAL HIGH (ref 0.1–1.0)
NEUTROS ABS: 18 10*3/uL — AB (ref 1.7–7.7)
NEUTROS PCT: 89 %
Platelets: 260 10*3/uL (ref 150–400)
RBC: 2.55 MIL/uL — AB (ref 4.22–5.81)
RDW: 15.1 % (ref 11.5–15.5)
WBC: 20.2 10*3/uL — AB (ref 4.0–10.5)

## 2015-10-28 LAB — URINE MICROSCOPIC-ADD ON

## 2015-10-28 LAB — URINALYSIS, ROUTINE W REFLEX MICROSCOPIC
BILIRUBIN URINE: NEGATIVE
GLUCOSE, UA: 100 mg/dL — AB
KETONES UR: NEGATIVE mg/dL
NITRITE: NEGATIVE
PH: 7 (ref 5.0–8.0)
Specific Gravity, Urine: 1.016 (ref 1.005–1.030)

## 2015-10-28 LAB — LIPID PANEL
CHOL/HDL RATIO: 2.6 ratio
Cholesterol: 105 mg/dL (ref 0–200)
HDL: 40 mg/dL — AB (ref >40)
LDL Cholesterol: 57 mg/dL (ref 0–99)
TRIGLYCERIDES: 39 mg/dL (ref <150)
VLDL: 8 mg/dL (ref 0–40)

## 2015-10-28 LAB — GLUCOSE, CAPILLARY
GLUCOSE-CAPILLARY: 80 mg/dL (ref 65–99)
GLUCOSE-CAPILLARY: 64 mg/dL — AB (ref 65–99)
GLUCOSE-CAPILLARY: 81 mg/dL (ref 65–99)
Glucose-Capillary: 94 mg/dL (ref 65–99)
Glucose-Capillary: 69 mg/dL (ref 65–99)

## 2015-10-28 LAB — GRAM STAIN

## 2015-10-28 LAB — STREP PNEUMONIAE URINARY ANTIGEN: STREP PNEUMO URINARY ANTIGEN: NEGATIVE

## 2015-10-28 LAB — PROTEIN, URINE, RANDOM: Total Protein, Urine: 450 mg/dL

## 2015-10-28 LAB — MRSA PCR SCREENING: MRSA by PCR: NEGATIVE

## 2015-10-28 LAB — SODIUM, URINE, RANDOM: SODIUM UR: 50 mmol/L

## 2015-10-28 LAB — CREATININE, URINE, RANDOM: Creatinine, Urine: 145.89 mg/dL

## 2015-10-28 LAB — TROPONIN I: TROPONIN I: 0.03 ng/mL (ref <0.031)

## 2015-10-28 MED ORDER — FUROSEMIDE 10 MG/ML IJ SOLN
80.0000 mg | Freq: Once | INTRAMUSCULAR | Status: AC
  Administered 2015-10-28: 80 mg via INTRAVENOUS
  Filled 2015-10-28: qty 8

## 2015-10-28 MED ORDER — CALCIUM ACETATE (PHOS BINDER) 667 MG PO CAPS
667.0000 mg | ORAL_CAPSULE | Freq: Three times a day (TID) | ORAL | Status: DC
  Administered 2015-10-28 – 2015-10-30 (×6): 667 mg via ORAL
  Filled 2015-10-28 (×6): qty 1

## 2015-10-28 MED ORDER — HEPARIN SODIUM (PORCINE) 5000 UNIT/ML IJ SOLN
5000.0000 [IU] | Freq: Three times a day (TID) | INTRAMUSCULAR | Status: DC
  Filled 2015-10-28 (×2): qty 1

## 2015-10-28 MED ORDER — PERFLUTREN LIPID MICROSPHERE
1.0000 mL | INTRAVENOUS | Status: AC | PRN
  Administered 2015-10-28: 2 mL via INTRAVENOUS
  Filled 2015-10-28: qty 10

## 2015-10-28 MED ORDER — INSULIN ASPART 100 UNIT/ML ~~LOC~~ SOLN: 0.0000 [IU] | Freq: Three times a day (TID) | SUBCUTANEOUS | Status: DC

## 2015-10-28 MED ORDER — PRO-STAT SUGAR FREE PO LIQD
30.0000 mL | Freq: Three times a day (TID) | ORAL | Status: DC
  Administered 2015-10-28 – 2015-10-29 (×5): 30 mL via ORAL
  Filled 2015-10-28 (×4): qty 30

## 2015-10-28 MED ORDER — ACETAMINOPHEN 500 MG PO TABS
500.0000 mg | ORAL_TABLET | Freq: Four times a day (QID) | ORAL | Status: DC | PRN
  Administered 2015-10-29 (×2): 500 mg via ORAL
  Filled 2015-10-28 (×2): qty 1

## 2015-10-28 MED ORDER — FUROSEMIDE 10 MG/ML IJ SOLN: 40.0000 mg | Freq: Once | INTRAMUSCULAR | Status: DC

## 2015-10-28 MED ORDER — SODIUM CHLORIDE 0.9 % IJ SOLN: 3.0000 mL | INTRAMUSCULAR | Status: DC | PRN

## 2015-10-28 MED ORDER — CALCIUM ACETATE (PHOS BINDER) 667 MG PO CAPS
667.0000 mg | ORAL_CAPSULE | Freq: Three times a day (TID) | ORAL | Status: DC
  Administered 2015-10-28: 667 mg via ORAL
  Filled 2015-10-28 (×5): qty 1

## 2015-10-28 MED ORDER — PERFLUTREN LIPID MICROSPHERE
1.0000 mL | INTRAVENOUS | Status: DC | PRN
  Filled 2015-10-28: qty 10

## 2015-10-28 MED ORDER — FUROSEMIDE 10 MG/ML IJ SOLN
80.0000 mg | Freq: Two times a day (BID) | INTRAMUSCULAR | Status: DC
  Administered 2015-10-28 – 2015-10-29 (×2): 80 mg via INTRAVENOUS
  Filled 2015-10-28 (×2): qty 8

## 2015-10-28 MED ORDER — CETYLPYRIDINIUM CHLORIDE 0.05 % MT LIQD
7.0000 mL | Freq: Two times a day (BID) | OROMUCOSAL | Status: DC
  Administered 2015-10-28 – 2015-10-29 (×4): 7 mL via OROMUCOSAL

## 2015-10-28 MED ORDER — SODIUM CHLORIDE 0.9 % IJ SOLN
3.0000 mL | Freq: Two times a day (BID) | INTRAMUSCULAR | Status: DC
Start: 2015-10-28 — End: 2015-10-31
  Administered 2015-10-28 – 2015-10-29 (×4): 3 mL via INTRAVENOUS

## 2015-10-28 MED ORDER — AMLODIPINE BESYLATE 10 MG PO TABS
10.0000 mg | ORAL_TABLET | Freq: Every day | ORAL | Status: DC
  Administered 2015-10-28: 10 mg via ORAL
  Filled 2015-10-28: qty 1

## 2015-10-28 MED ORDER — GLIPIZIDE 2.5 MG HALF TABLET
2.5000 mg | ORAL_TABLET | Freq: Every day | ORAL | Status: DC
  Administered 2015-10-28: 2.5 mg via ORAL
  Filled 2015-10-28 (×2): qty 1

## 2015-10-28 NOTE — Progress Notes (Signed)
Hypoglycemic Event  CBG: 64  Treatment: 15 GM carbohydrate snack  Symptoms: None  Follow-up CBG: Time:2321 CBG Result:81  Possible Reasons for Event: Inadequate meal intake  Comments/MD notified:    Reap, Jon Gills

## 2015-10-28 NOTE — Consult Note (Signed)
Reason for Consult: Renal failure Referring Physician: Dr. Aldine Contes  Chief Complaint: Dyspnea  Assessment: 1. 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 Posey Pronto in the office. Unfortunately he is not a candidate for dialysis given that he has been bedbound for 2 years and is needle phobic. 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 - Medical management given he is not a candidate for long term dialysis (he will need HCO3 therapy). - Will check phos to determine if he needs a binder and will check a iPTH (goal 150-300). - Eventually may need a palliative care consult as there is not much medically that can be done for the patient sans dialysis.  2. Anemia - will check an iron panel and replete if needed prior to starting ESA. 3. Metabolic acidosis from the advanced renal failure - really should start HCO3 therapy but this will also worsen the hypervolemia given the Na load. 4. Dyspnea - volume overload, cardiac asthma? --> no e/o PNA on CXR. 5. DM 6. HTN - fairly well controlled with the current regimen.     HPI: AUM CAGGIANO is an 40 y.o. male morbid obesity who has been bed bound for at least 2 years now, DM with nephrotic range proteinuria + end organ damage to the retina previously seen by Dr. Elmarie Shiley for his advanced CKD. Unfortunately he was deemed not to be a candidate for long term dialysis because of the morbid obesity, bed bound for 2 years with poor quality of life and needle phobia. Because of problems with transportation (mother was given bills for transport) he was not following up in [facility] with Dr. Posey Pronto. Now he is presenting with dyspnea after battling a cough productive of greenish sputum. He denies fever, chills, nausea, vomiting, diarrhea, abdominal pain or syncopal episodes. He feels the lower extremity swelling is worse with associated  decreased decreased UOP. He denies any dysuria, hematuria, obstructive like symptoms.  ROS Pertinent items are noted in HPI.  Chemistry and CBC: CREAT  Date/Time Value Ref Range Status  05/05/2015 01:43 PM 5.70* 0.50 - 1.35 mg/dL Final  11/25/2014 01:43 PM 4.40* 0.50 - 1.35 mg/dL Final  10/01/2014 03:57 PM 3.90* 0.50 - 1.35 mg/dL Final   CREATININE, SER  Date/Time Value Ref Range Status  10/28/2015 03:49 AM 10.94* 0.61 - 1.24 mg/dL Final  11/11/2015 05:55 PM 10.72* 0.61 - 1.24 mg/dL Final  01/02/2015 11:55 AM 4.87* 0.50 - 1.35 mg/dL Final  12/27/2014 05:52 AM 5.10* 0.50 - 1.35 mg/dL Final  12/26/2014 07:10 AM 5.05* 0.50 - 1.35 mg/dL Final  12/23/2014 02:38 PM 5.19* 0.50 - 1.35 mg/dL Final  10/16/2014 09:25 AM 4.24* 0.50 - 1.35 mg/dL Final  10/07/2014 04:45 AM 3.96* 0.50 - 1.35 mg/dL Final  10/05/2014 04:20 AM 3.97* 0.50 - 1.35 mg/dL Final  10/03/2014 02:02 PM 3.83* 0.50 - 1.35 mg/dL Final  09/19/2013 08:27 AM 1.04 0.50 - 1.35 mg/dL Final  08/22/2013 05:40 AM 1.91* 0.50 - 1.35 mg/dL Final  08/20/2013 05:15 AM 1.80* 0.50 - 1.35 mg/dL Final  08/18/2013 03:50 AM 1.30 0.50 - 1.35 mg/dL Final  08/17/2013 06:50 PM 1.19 0.50 - 1.35 mg/dL Final  08/02/2013 05:20 AM 1.87* 0.50 - 1.35 mg/dL Final  08/01/2013 04:40 AM 1.77* 0.50 - 1.35 mg/dL Final  07/31/2013 04:05 AM 2.07* 0.50 - 1.35 mg/dL Final  07/30/2013 08:50 AM 2.34* 0.50 - 1.35 mg/dL  Final  07/29/2013 05:50 AM 2.30* 0.50 - 1.35 mg/dL Final  07/28/2013 06:30 AM 2.43* 0.50 - 1.35 mg/dL Final  07/27/2013 11:50 AM 2.71* 0.50 - 1.35 mg/dL Final  07/27/2013 08:15 AM 2.87* 0.50 - 1.35 mg/dL Final  07/26/2013 05:25 AM 2.95* 0.50 - 1.35 mg/dL Final  07/25/2013 05:44 AM 2.45* 0.50 - 1.35 mg/dL Final  07/24/2013 09:05 PM 2.28* 0.50 - 1.35 mg/dL Final  09/22/2012 04:57 PM 1.29 0.50 - 1.35 mg/dL Final  01/01/2012 06:00 AM 1.13 0.50 - 1.35 mg/dL Final  12/26/2011 05:00 AM 1.27 0.50 - 1.35 mg/dL Final  12/24/2011 05:41 AM 1.27 0.50 - 1.35  mg/dL Final  12/23/2011 05:00 AM 1.37* 0.50 - 1.35 mg/dL Final    Comment:    DELTA CHECK NOTED  12/21/2011 11:42 PM 0.81 0.50 - 1.35 mg/dL Final  02/04/2011 03:02 PM 0.50 0.4 - 1.5 mg/dL Final  04/18/2008 06:35 AM 0.56  Final    Recent Labs Lab 10/26/2015 1755 10/28/15 0349  NA 132* 134*  K 5.0 5.1  CL 106 107  CO2 8* 10*  GLUCOSE 188* 106*  BUN 118* 119*  CREATININE 10.72* 10.94*  CALCIUM 6.0* 5.7*  PHOS  --  9.8*    Recent Labs Lab 11/13/2015 1755 10/28/15 0350  WBC 21.3* 20.2*  NEUTROABS  --  18.0*  HGB 7.9* 7.1*  HCT 25.5* 22.4*  MCV 88.5 87.8  PLT 278 260   Liver Function Tests:  Recent Labs Lab 11/07/2015 2133 10/28/15 0349  AST 10*  --   ALT 8*  --   ALKPHOS 126  --   BILITOT 0.5  --   PROT 8.0  --   ALBUMIN 2.6* 2.3*   No results for input(s): LIPASE, AMYLASE in the last 168 hours. No results for input(s): AMMONIA in the last 168 hours. Cardiac Enzymes:  Recent Labs Lab 10/25/2015 1755 10/28/15 0347  TROPONINI 0.04* 0.03   Iron Studies: No results for input(s): IRON, TIBC, TRANSFERRIN, FERRITIN in the last 72 hours. PT/INR: @LABRCNTIP (inr:5)  Xrays/Other Studies: ) Results for orders placed or performed during the hospital encounter of 11/05/2015 (from the past 48 hour(s))  Basic metabolic panel     Status: Abnormal   Collection Time: 11/11/2015  5:55 PM  Result Value Ref Range   Sodium 132 (L) 135 - 145 mmol/L   Potassium 5.0 3.5 - 5.1 mmol/L   Chloride 106 101 - 111 mmol/L   CO2 8 (L) 22 - 32 mmol/L   Glucose, Bld 188 (H) 65 - 99 mg/dL   BUN 118 (H) 6 - 20 mg/dL   Creatinine, Ser 10.72 (H) 0.61 - 1.24 mg/dL   Calcium 6.0 (LL) 8.9 - 10.3 mg/dL    Comment: CRITICAL RESULT CALLED TO, READ BACK BY AND VERIFIED WITH: RN N STEPHENS AT [phone] MARTINB    GFR calc non Af Amer 5 (L) >60 mL/min   GFR calc Af Amer 6 (L) >60 mL/min    Comment: (NOTE) The eGFR has been calculated using the CKD EPI equation. This calculation has not been  validated in all clinical situations. eGFR's persistently <60 mL/min signify possible Chronic Kidney Disease.    Anion gap 18 (H) 5 - 15  CBC     Status: Abnormal   Collection Time: 11/05/2015  5:55 PM  Result Value Ref Range   WBC 21.3 (H) 4.0 - 10.5 K/uL   RBC 2.88 (L) 4.22 - 5.81 MIL/uL   Hemoglobin 7.9 (L) 13.0 - 17.0 g/dL  HCT 25.5 (L) 39.0 - 52.0 %   MCV 88.5 78.0 - 100.0 fL   MCH 27.4 26.0 - 34.0 pg   MCHC 31.0 30.0 - 36.0 g/dL   RDW 15.1 11.5 - 15.5 %   Platelets 278 150 - 400 K/uL  Troponin I     Status: Abnormal   Collection Time: 10/24/2015  5:55 PM  Result Value Ref Range   Troponin I 0.04 (H) <0.031 ng/mL    Comment:        PERSISTENTLY INCREASED TROPONIN VALUES IN THE RANGE OF 0.04-0.49 ng/mL CAN BE SEEN IN:       -UNSTABLE ANGINA       -CONGESTIVE HEART FAILURE       -MYOCARDITIS       -CHEST TRAUMA       -ARRYHTHMIAS       -LATE PRESENTING MYOCARDIAL INFARCTION       -COPD   CLINICAL FOLLOW-UP RECOMMENDED.   Brain natriuretic peptide     Status: Abnormal   Collection Time: 11/06/2015  5:56 PM  Result Value Ref Range   B Natriuretic Peptide 131.6 (H) 0.0 - 100.0 pg/mL  Magnesium     Status: Abnormal   Collection Time: 10/31/2015  9:33 PM  Result Value Ref Range   Magnesium 1.6 (L) 1.7 - 2.4 mg/dL  Hepatic function panel     Status: Abnormal   Collection Time: 10/20/2015  9:33 PM  Result Value Ref Range   Total Protein 8.0 6.5 - 8.1 g/dL   Albumin 2.6 (L) 3.5 - 5.0 g/dL   AST 10 (L) 15 - 41 U/L   ALT 8 (L) 17 - 63 U/L   Alkaline Phosphatase 126 38 - 126 U/L   Total Bilirubin 0.5 0.3 - 1.2 mg/dL   Bilirubin, Direct 0.2 0.1 - 0.5 mg/dL   Indirect Bilirubin 0.3 0.3 - 0.9 mg/dL  Troponin I (q 6hr x 3)     Status: None   Collection Time: 10/28/15  3:47 AM  Result Value Ref Range   Troponin I 0.03 <0.031 ng/mL    Comment:        NO INDICATION OF MYOCARDIAL INJURY.   Renal function panel     Status: Abnormal   Collection Time: 10/28/15  3:49 AM  Result  Value Ref Range   Sodium 134 (L) 135 - 145 mmol/L   Potassium 5.1 3.5 - 5.1 mmol/L   Chloride 107 101 - 111 mmol/L   CO2 10 (L) 22 - 32 mmol/L   Glucose, Bld 106 (H) 65 - 99 mg/dL   BUN 119 (H) 6 - 20 mg/dL   Creatinine, Ser 10.94 (H) 0.61 - 1.24 mg/dL   Calcium 5.7 (LL) 8.9 - 10.3 mg/dL    Comment: CRITICAL RESULT CALLED TO, READ BACK BY AND VERIFIED WITH: PHILLIPS T,RN 10/28/15 0435 WAYK    Phosphorus 9.8 (H) 2.5 - 4.6 mg/dL   Albumin 2.3 (L) 3.5 - 5.0 g/dL   GFR calc non Af Amer 5 (L) >60 mL/min   GFR calc Af Amer 6 (L) >60 mL/min    Comment: (NOTE) The eGFR has been calculated using the CKD EPI equation. This calculation has not been validated in all clinical situations. eGFR's persistently <60 mL/min signify possible Chronic Kidney Disease.    Anion gap 17 (H) 5 - 15  Lipid panel     Status: Abnormal   Collection Time: 10/28/15  3:49 AM  Result Value Ref Range   Cholesterol 105 0 -  200 mg/dL   Triglycerides 39 <150 mg/dL   HDL 40 (L) >40 mg/dL   Total CHOL/HDL Ratio 2.6 RATIO   VLDL 8 0 - 40 mg/dL   LDL Cholesterol 57 0 - 99 mg/dL    Comment:        Total Cholesterol/HDL:CHD Risk Coronary Heart Disease Risk Table                     Men   Women  1/2 Average Risk   3.4   3.3  Average Risk       5.0   4.4  2 X Average Risk   9.6   7.1  3 X Average Risk  23.4   11.0        Use the calculated Patient Ratio above and the CHD Risk Table to determine the patient's CHD Risk.        ATP III CLASSIFICATION (LDL):  <100     mg/dL   Optimal  100-129  mg/dL   Near or Above                    Optimal  130-159  mg/dL   Borderline  160-189  mg/dL   High  >190     mg/dL   Very High   CBC with Differential/Platelet     Status: Abnormal   Collection Time: 10/28/15  3:50 AM  Result Value Ref Range   WBC 20.2 (H) 4.0 - 10.5 K/uL   RBC 2.55 (L) 4.22 - 5.81 MIL/uL   Hemoglobin 7.1 (L) 13.0 - 17.0 g/dL   HCT 22.4 (L) 39.0 - 52.0 %   MCV 87.8 78.0 - 100.0 fL   MCH 27.8 26.0 -  34.0 pg   MCHC 31.7 30.0 - 36.0 g/dL   RDW 15.1 11.5 - 15.5 %   Platelets 260 150 - 400 K/uL   Neutrophils Relative % 89 %   Neutro Abs 18.0 (H) 1.7 - 7.7 K/uL   Lymphocytes Relative 5 %   Lymphs Abs 1.0 0.7 - 4.0 K/uL   Monocytes Relative 6 %   Monocytes Absolute 1.1 (H) 0.1 - 1.0 K/uL   Eosinophils Relative 0 %   Eosinophils Absolute 0.1 0.0 - 0.7 K/uL   Basophils Relative 0 %   Basophils Absolute 0.0 0.0 - 0.1 K/uL  Gram stain     Status: None   Collection Time: 10/28/15  3:57 AM  Result Value Ref Range   Specimen Description URINE, RANDOM    Special Requests NONE    Gram Stain      FEW WBC PRESENT,BOTH PMN AND MONONUCLEAR FEW GRAM NEGATIVE RODS FEW GRAM POSITIVE COCCI IN PAIRS CRITICAL RESULT CALLED TO, READ BACK BY AND VERIFIED WITH: T PHILLIPS @0536  10/28/15 MKELLY    Report Status 10/28/2015 FINAL   Strep pneumoniae urinary antigen     Status: None   Collection Time: 10/28/15  3:57 AM  Result Value Ref Range   Strep Pneumo Urinary Antigen NEGATIVE NEGATIVE    Comment:        Infection due to S. pneumoniae cannot be absolutely ruled out since the antigen present may be below the detection limit of the test.   Urinalysis, Routine w reflex microscopic (not at [facility])     Status: Abnormal   Collection Time: 10/28/15  3:57 AM  Result Value Ref Range   Color, Urine YELLOW YELLOW   APPearance CLOUDY (A) CLEAR   Specific Gravity, Urine 1.016  1.005 - 1.030   pH 7.0 5.0 - 8.0   Glucose, UA 100 (A) NEGATIVE mg/dL   Hgb urine dipstick SMALL (A) NEGATIVE   Bilirubin Urine NEGATIVE NEGATIVE   Ketones, ur NEGATIVE NEGATIVE mg/dL   Protein, ur >300 (A) NEGATIVE mg/dL   Nitrite NEGATIVE NEGATIVE   Leukocytes, UA MODERATE (A) NEGATIVE  Urine microscopic-add on     Status: Abnormal   Collection Time: 10/28/15  3:57 AM  Result Value Ref Range   Squamous Epithelial / LPF 0-5 (A) NONE SEEN   WBC, UA 6-30 0 - 5 WBC/hpf   RBC / HPF 0-5 0 - 5 RBC/hpf   Bacteria, UA MANY (A) NONE  SEEN  MRSA PCR Screening     Status: None   Collection Time: 10/28/15  6:11 AM  Result Value Ref Range   MRSA by PCR NEGATIVE NEGATIVE    Comment:        The GeneXpert MRSA Assay (FDA approved for NASAL specimens only), is one component of a comprehensive MRSA colonization surveillance program. It is not intended to diagnose MRSA infection nor to guide or monitor treatment for MRSA infections.   Glucose, capillary     Status: None   Collection Time: 10/28/15  8:20 AM  Result Value Ref Range   Glucose-Capillary 80 65 - 99 mg/dL   Comment 1 Notify RN    Comment 2 Document in Chart   Glucose, capillary     Status: None   Collection Time: 10/28/15 11:38 AM  Result Value Ref Range   Glucose-Capillary 94 65 - 99 mg/dL   Comment 1 Notify RN    Comment 2 Document in Chart    Dg Chest Port 1 View  11/05/2015  CLINICAL DATA:  Shortness of breath. EXAM: PORTABLE CHEST 1 VIEW COMPARISON:  October 03, 2014. FINDINGS: Mild cardiomegaly is noted. No pneumothorax or significant pleural effusion is noted. Mild diffuse airspace opacities are noted which may represent edema, inflammation or possibly overlying soft tissue artifact due to body habitus. Bony thorax is unremarkable. IMPRESSION: Mild diffuse airspace opacities are noted which may represent pneumonia, edema or possibly overlying soft tissue artifact due to body habitus. Electronically Signed   By: Marijo Conception, M.D.   On: 10/23/2015 18:26    PMH:   Past Medical History  Diagnosis Date  . Shortness of breath     with exertion  . Anxiety   . Mental disorder   . Depression   . GERD (gastroesophageal reflux disease)   . Constipation   . Neuropathy (HCC)     feet, hands  . Obesity   . Hx MRSA infection   . Sleep apnea     "does not wear mask" (12/24/2014)  . History of blood transfusion     "related to foot surgery"  . History of stomach ulcers   . Septic arthritis of shoulder, left ([place])   . Chronic kidney disease     "here  w/kidney problems today" (12/24/2014)    PSH:   Past Surgical History  Procedure Laterality Date  . I&d extremity  12/22/2011    Procedure: IRRIGATION AND DEBRIDEMENT EXTREMITY;  Surgeon: Sharmon Revere, MD;  Location: [place];  Service: Orthopedics;  Laterality: Left;  . I&d extremity  12/24/2011    Procedure: IRRIGATION AND DEBRIDEMENT EXTREMITY;  Surgeon: Sharmon Revere, MD;  Location: [place];  Service: Orthopedics;  Laterality: Left;  Irrigation and debridement left foot.  . I&d extremity  12/26/2011  Procedure: IRRIGATION AND DEBRIDEMENT EXTREMITY;  Surgeon: Sharmon Revere, MD;  Location: [place];  Service: Orthopedics;  Laterality: Left;  . I&d extremity  12/28/2011    Procedure: IRRIGATION AND DEBRIDEMENT EXTREMITY;  Surgeon: Sharmon Revere, MD;  Location: [place];  Service: Orthopedics;  Laterality: Left;  I&D Left Foot;Application of  Wound Vac.to dorsal and plantar areas of left foot  . Eye surgery Bilateral     "bleeding behind my eyes"  . Toe surgery Right     Bone removed- 2 toe right  . Wisdom tooth extraction    . Amputation  09/22/2012    Procedure: AMPUTATION FOOT;  Surgeon: Newt Minion, MD;  Location: [place];  Service: Orthopedics;  Laterality: Left;  Left Midfoot Amputation  . Shoulder arthroscopy Left 08/18/2013    Procedure: ARTHROSCOPY SHOULDER and subacromial decompresion and debreidment of abcess;  Surgeon: Newt Minion, MD;  Location: [place];  Service: Orthopedics;  Laterality: Left;  . Amputation Right 09/19/2013    Procedure: AMPUTATION RAY- right;  Surgeon: Newt Minion, MD;  Location: [place];  Service: Orthopedics;  Laterality: Right;  Right Foot 5th Ray Amputation  . Foot fracture surgery Bilateral 2000's    Allergies:  Allergies  Allergen Reactions  . Sulfonamide Derivatives Other (See Comments)    cramps  . Rocephin [Ceftriaxone] Hives and Itching    Medications:   Prior to Admission medications   Medication Sig Start Date End Date Taking? Authorizing  Provider  acetaminophen (TYLENOL) 500 MG tablet Take 500 mg by mouth every 6 (six) hours as needed for mild pain.   Yes Historical Provider, MD  amLODipine (NORVASC) 10 MG tablet TAKE 1 TABLET (10 MG TOTAL) BY MOUTH DAILY. 05/05/15  Yes Tasrif Ahmed, MD  furosemide (LASIX) 80 MG tablet Take 1 tablet (80 mg total) by mouth daily. 05/05/15  Yes Tasrif Ahmed, MD  glipiZIDE (GLUCOTROL) 5 MG tablet Take 1 tablet (5 mg total) by mouth daily before breakfast. 05/05/15 05/04/16 Yes Tasrif Ahmed, MD  hydrALAZINE (APRESOLINE) 25 MG tablet Take 1 tablet (25 mg total) by mouth 3 (three) times daily. Patient taking differently: Take 25 mg by mouth daily.  05/05/15  Yes Tasrif Ahmed, MD  Vitamin D, Ergocalciferol, (DRISDOL) 50000 UNITS CAPS capsule Take 1 capsule (50,000 Units total) by mouth every 7 (seven) days. 05/06/15  Yes Tasrif Ahmed, MD  B-D ULTRAFINE III SHORT PEN 31G X 8 MM MISC USE AS DIRECTED WITH FLEXPEN 11/14/14   Tasrif Ahmed, MD  Blood Glucose Monitoring Suppl (ONE TOUCH ULTRA MINI) W/DEVICE KIT Check blood sugar one time a day 10/01/14   Otho Bellows, MD  calcium acetate (PHOSLO) 667 MG capsule TAKE 3 CAPSULES (2,001 MG TOTAL) BY MOUTH 3 (THREE) TIMES DAILY WITH MEALS. Patient not taking: Reported on 11/15/2015 03/10/15   Dellia Nims, MD  ferrous gluconate (FERGON) 324 MG tablet Take 1 tablet (324 mg total) by mouth 2 (two) times daily with a meal. Patient not taking: Reported on 11/05/2015 12/27/14   Luan Moore, MD  fluticasone [facility]) 50 MCG/ACT nasal spray Place 1 spray into both nostrils daily. Patient not taking: Reported on 11/15/2015 10/07/14   Luan Moore, MD  nystatin (MYCOSTATIN/NYSTOP) 100000 UNIT/GM POWD Apply to affected areas on the lower abdomen/groin area twice daily. Keep skin dry. Patient not taking: Reported on 12/24/2014 10/16/14   Blain Pais, MD  ONE TOUCH ULTRA TEST test strip CHECK BLOOD SUGAR ONE TIME A DAY 10/24/15   Tasrif Ahmed,  MD  [facility] DELICA LANCETS FINE MISC  Check blood sugar one time a day 10/01/14   Otho Bellows, MD  sodium bicarbonate 650 MG tablet TAKE 2 TABLETS BY MOUTH 3 TIMES A DAY Patient not taking: Reported on 10/20/2015 07/08/15   Dellia Nims, MD    Discontinued Meds:   Medications Discontinued During This Encounter  Medication Reason  . senna-docusate (SENOKOT-S) 8.6-50 MG per tablet Inpatient Standard  . sodium bicarbonate tablet 1,300 mg   . calcium acetate (PHOSLO) capsule 667 mg   . insulin aspart (novoLOG) injection 0-9 Units   . insulin aspart (novoLOG) injection 0-5 Units   . furosemide (LASIX) injection 40 mg   . calcium acetate (PHOSLO) capsule 667 mg   . levofloxacin (LEVAQUIN) IVPB 750 mg Dose change    Social History:  reports that he has never smoked. He has never used smokeless tobacco. He reports that he does not drink alcohol or use illicit drugs.  Family History:  History reviewed. No pertinent family history.  Blood pressure 108/52, pulse 94, temperature 98.2 F (36.8 C), temperature source Oral, resp. rate 28, height 5' 11"  (1.803 m), weight 255.829 kg (564 lb), SpO2 91 %. General appearance: alert and appears stated age Head: Normocephalic, without obvious abnormality, atraumatic Eyes: negative Neck: no adenopathy, no carotid bruit, no JVD, supple, symmetrical, trachea midline and thyroid not enlarged, symmetric, no tenderness/mass/nodules Back: symmetric, no curvature. ROM normal. No CVA tenderness. Resp: wheezes bibasilar and poor air movement Chest wall: no tenderness Cardio: regular rate and rhythm, S1, S2 normal, no murmur, click, rub or gallop GI: soft, non-tender; bowel sounds normal; no masses,  no organomegaly Extremities: edema 2+, brawny Pulses: 2+ and symmetric Skin: Skin color, texture, turgor normal. No rashes or lesions Lymph nodes: Cervical, supraclavicular, and axillary nodes normal. Neurologic: Grossly normal       LIN, Hunt Oris, MD 10/28/2015, 1:21 PM

## 2015-10-28 NOTE — Progress Notes (Signed)
Initial Nutrition Assessment  DOCUMENTATION CODES:   Morbid obesity  INTERVENTION:   Prostat liquid protein po 30 ml TID with meals, each supplement provides 100 kcal, 15 grams protein  NUTRITION DIAGNOSIS:   Increased nutrient needs related to wound healing as evidenced by estimated needs   GOAL:   Patient will meet greater than or equal to 90% of their needs  MONITOR:   PO intake, Supplement acceptance, Labs, Weight trends, Skin, I & O's  REASON FOR ASSESSMENT:   Low Braden  ASSESSMENT:   40 y/o male with PMH of morbid obesity (approx 550 lbs), insulin dependent type 2 DM, nephrotic syndrome with CKD stage 4, HTN, OSA who p/w cough, SOB over the past 1 -2 weeks. Patient states cough is productive of greenish phlegm. No hemoptysis. Was assoc with worsening SOB, chest congestion and rhinorrhea. States the cough and chest congestion had improved initially after taking chloracedin but then recurred. Yesterday he noted lightheadedness after using the bedpan and came to the ED for further eval.   RD unable to speak with patient at time of visit.  Currently on a Renal diet with fluid restriction.  No % PO intake records available.  Pt with Stage II wound to knee and buttocks per flowsheet.  RD to add liquid protein supplements.  Low braden score places patient at risk for further skin breakdown.  Nutrition focused physical exam completed.  No muscle or subcutaneous fat depletion noticed.  Diet Order:  Diet renal with fluid restriction Fluid restriction:: 1200 mL Fluid; Room service appropriate?: Yes; Fluid consistency:: Thin  Skin:  Wound (see comment) (Stage II to knee & buttocks)  Last BM:  1/10  Height:   Ht Readings from Last 1 Encounters:  10/28/15 5\' 11"  (1.803 m)    Weight:   Wt Readings from Last 1 Encounters:  10/28/15 564 lb (255.829 kg)    Ideal Body Weight:  78.1 kg  BMI:  Body mass index is 78.7 kg/(m^2).  Estimated Nutritional Needs:   Kcal:   2200-2400  Protein:  120-130 gm  Fluid:  1200 ml  EDUCATION NEEDS:   No education needs identified at this time  Arthur Holms, RD, LDN Pager #: [phone] After-Hours Pager #: [phone]

## 2015-10-28 NOTE — Progress Notes (Signed)
Subjective:  Patient seen and examined at bedside after being admitted overnight.  He says that he was feeling short of breath for past 2-3 days and feels like he gained a lot of weight over the last year. He had taken chloracedin for the cough and congestion and it had initially improved then recurred.  He also notes worsening lower extremity swelling.  He also notes decreased urine output for last 3-4 days but he is able to make urine.   Objective: Vital signs in last 24 hours: Filed Vitals:   10/28/15 0611 10/28/15 0825 10/28/15 1047 10/28/15 1140  BP: 135/64 120/63 127/67 108/52  Pulse: 95 93  94  Temp: 98.4 F (36.9 C) 97.4 F (36.3 C)  98.2 F (36.8 C)  TempSrc: Oral Oral  Oral  Resp: 31 32  28  Height: 5\' 11"  (1.803 m)     Weight: 564 lb (255.829 kg)     SpO2: 95% 95%  91%   Weight change:   Intake/Output Summary (Last 24 hours) at 10/28/15 1456 Last data filed at 10/22/2015 2143  Gross per 24 hour  Intake    710 ml  Output      0 ml  Net    710 ml   General: Vital signs reviewed. morbidly obese patient lying uncomfortably in bed, on 2 L oxygen , mother at bedside Cardiovascular: difficult to appreciate cardiac sounds  Pulmonary/Chest: difficult to auscultate lung sounds Abdominal: Soft, obese,  non-tender, non-distended, BS + Extremities: severe 3+ lower extremity edema extending upto thighs, pulses difficult to palpate but present  Skin: Warm, dry and intact. No rashes or erythema.   Lab Results: Results for orders placed or performed during the hospital encounter of 10/28/2015 (from the past 24 hour(s))  Culture, blood (routine x 2)     Status: None (Preliminary result)   Collection Time: 11/07/2015  5:35 PM  Result Value Ref Range   Specimen Description BLOOD RIGHT ARM    Special Requests BOTTLES DRAWN AEROBIC ONLY 10CC    Culture NO GROWTH < 24 HOURS    Report Status PENDING   Culture, blood (routine x 2)     Status: None (Preliminary result)   Collection  Time: 10/29/2015  5:45 PM  Result Value Ref Range   Specimen Description BLOOD RIGHT HAND    Special Requests BOTTLES DRAWN AEROBIC ONLY 10CC    Culture NO GROWTH < 24 HOURS    Report Status PENDING   Basic metabolic panel     Status: Abnormal   Collection Time: 11/01/2015  5:55 PM  Result Value Ref Range   Sodium 132 (L) 135 - 145 mmol/L   Potassium 5.0 3.5 - 5.1 mmol/L   Chloride 106 101 - 111 mmol/L   CO2 8 (L) 22 - 32 mmol/L   Glucose, Bld 188 (H) 65 - 99 mg/dL   BUN 118 (H) 6 - 20 mg/dL   Creatinine, Ser 10.72 (H) 0.61 - 1.24 mg/dL   Calcium 6.0 (LL) 8.9 - 10.3 mg/dL   GFR calc non Af Amer 5 (L) >60 mL/min   GFR calc Af Amer 6 (L) >60 mL/min   Anion gap 18 (H) 5 - 15  CBC     Status: Abnormal   Collection Time: 10/26/2015  5:55 PM  Result Value Ref Range   WBC 21.3 (H) 4.0 - 10.5 K/uL   RBC 2.88 (L) 4.22 - 5.81 MIL/uL   Hemoglobin 7.9 (L) 13.0 - 17.0 g/dL   HCT 25.5 (  L) 39.0 - 52.0 %   MCV 88.5 78.0 - 100.0 fL   MCH 27.4 26.0 - 34.0 pg   MCHC 31.0 30.0 - 36.0 g/dL   RDW 15.1 11.5 - 15.5 %   Platelets 278 150 - 400 K/uL  Troponin I     Status: Abnormal   Collection Time: 11/08/2015  5:55 PM  Result Value Ref Range   Troponin I 0.04 (H) <0.031 ng/mL  Brain natriuretic peptide     Status: Abnormal   Collection Time: 11/07/2015  5:56 PM  Result Value Ref Range   B Natriuretic Peptide 131.6 (H) 0.0 - 100.0 pg/mL  Magnesium     Status: Abnormal   Collection Time: 11/17/2015  9:33 PM  Result Value Ref Range   Magnesium 1.6 (L) 1.7 - 2.4 mg/dL  Hepatic function panel     Status: Abnormal   Collection Time: 10/26/2015  9:33 PM  Result Value Ref Range   Total Protein 8.0 6.5 - 8.1 g/dL   Albumin 2.6 (L) 3.5 - 5.0 g/dL   AST 10 (L) 15 - 41 U/L   ALT 8 (L) 17 - 63 U/L   Alkaline Phosphatase 126 38 - 126 U/L   Total Bilirubin 0.5 0.3 - 1.2 mg/dL   Bilirubin, Direct 0.2 0.1 - 0.5 mg/dL   Indirect Bilirubin 0.3 0.3 - 0.9 mg/dL  Troponin I (q 6hr x 3)     Status: None   Collection  Time: 10/28/15  3:47 AM  Result Value Ref Range   Troponin I 0.03 <0.031 ng/mL  Renal function panel     Status: Abnormal   Collection Time: 10/28/15  3:49 AM  Result Value Ref Range   Sodium 134 (L) 135 - 145 mmol/L   Potassium 5.1 3.5 - 5.1 mmol/L   Chloride 107 101 - 111 mmol/L   CO2 10 (L) 22 - 32 mmol/L   Glucose, Bld 106 (H) 65 - 99 mg/dL   BUN 119 (H) 6 - 20 mg/dL   Creatinine, Ser 10.94 (H) 0.61 - 1.24 mg/dL   Calcium 5.7 (LL) 8.9 - 10.3 mg/dL   Phosphorus 9.8 (H) 2.5 - 4.6 mg/dL   Albumin 2.3 (L) 3.5 - 5.0 g/dL   GFR calc non Af Amer 5 (L) >60 mL/min   GFR calc Af Amer 6 (L) >60 mL/min   Anion gap 17 (H) 5 - 15  Lipid panel     Status: Abnormal   Collection Time: 10/28/15  3:49 AM  Result Value Ref Range   Cholesterol 105 0 - 200 mg/dL   Triglycerides 39 <150 mg/dL   HDL 40 (L) >40 mg/dL   Total CHOL/HDL Ratio 2.6 RATIO   VLDL 8 0 - 40 mg/dL   LDL Cholesterol 57 0 - 99 mg/dL  CBC with Differential/Platelet     Status: Abnormal   Collection Time: 10/28/15  3:50 AM  Result Value Ref Range   WBC 20.2 (H) 4.0 - 10.5 K/uL   RBC 2.55 (L) 4.22 - 5.81 MIL/uL   Hemoglobin 7.1 (L) 13.0 - 17.0 g/dL   HCT 22.4 (L) 39.0 - 52.0 %   MCV 87.8 78.0 - 100.0 fL   MCH 27.8 26.0 - 34.0 pg   MCHC 31.7 30.0 - 36.0 g/dL   RDW 15.1 11.5 - 15.5 %   Platelets 260 150 - 400 K/uL   Neutrophils Relative % 89 %   Neutro Abs 18.0 (H) 1.7 - 7.7 K/uL   Lymphocytes Relative 5 %  Lymphs Abs 1.0 0.7 - 4.0 K/uL   Monocytes Relative 6 %   Monocytes Absolute 1.1 (H) 0.1 - 1.0 K/uL   Eosinophils Relative 0 %   Eosinophils Absolute 0.1 0.0 - 0.7 K/uL   Basophils Relative 0 %   Basophils Absolute 0.0 0.0 - 0.1 K/uL  Gram stain     Status: None   Collection Time: 10/28/15  3:57 AM  Result Value Ref Range   Specimen Description URINE, RANDOM    Special Requests NONE    Gram Stain      FEW WBC PRESENT,BOTH PMN AND MONONUCLEAR FEW GRAM NEGATIVE RODS FEW GRAM POSITIVE COCCI IN PAIRS CRITICAL  RESULT CALLED TO, READ BACK BY AND VERIFIED WITH: T PHILLIPS @0536  10/28/15 MKELLY    Report Status 10/28/2015 FINAL   Strep pneumoniae urinary antigen     Status: None   Collection Time: 10/28/15  3:57 AM  Result Value Ref Range   Strep Pneumo Urinary Antigen NEGATIVE NEGATIVE  Urinalysis, Routine w reflex microscopic (not at [facility])     Status: Abnormal   Collection Time: 10/28/15  3:57 AM  Result Value Ref Range   Color, Urine YELLOW YELLOW   APPearance CLOUDY (A) CLEAR   Specific Gravity, Urine 1.016 1.005 - 1.030   pH 7.0 5.0 - 8.0   Glucose, UA 100 (A) NEGATIVE mg/dL   Hgb urine dipstick SMALL (A) NEGATIVE   Bilirubin Urine NEGATIVE NEGATIVE   Ketones, ur NEGATIVE NEGATIVE mg/dL   Protein, ur >300 (A) NEGATIVE mg/dL   Nitrite NEGATIVE NEGATIVE   Leukocytes, UA MODERATE (A) NEGATIVE  Urine microscopic-add on     Status: Abnormal   Collection Time: 10/28/15  3:57 AM  Result Value Ref Range   Squamous Epithelial / LPF 0-5 (A) NONE SEEN   WBC, UA 6-30 0 - 5 WBC/hpf   RBC / HPF 0-5 0 - 5 RBC/hpf   Bacteria, UA MANY (A) NONE SEEN  MRSA PCR Screening     Status: None   Collection Time: 10/28/15  6:11 AM  Result Value Ref Range   MRSA by PCR NEGATIVE NEGATIVE  Glucose, capillary     Status: None   Collection Time: 10/28/15  8:20 AM  Result Value Ref Range   Glucose-Capillary 80 65 - 99 mg/dL   Comment 1 Notify RN    Comment 2 Document in Chart   Glucose, capillary     Status: None   Collection Time: 10/28/15 11:38 AM  Result Value Ref Range   Glucose-Capillary 94 65 - 99 mg/dL   Comment 1 Notify RN    Comment 2 Document in Chart     Micro Results: Recent Results (from the past 240 hour(s))  Culture, blood (routine x 2)     Status: None (Preliminary result)   Collection Time: 10/26/2015  5:35 PM  Result Value Ref Range Status   Specimen Description BLOOD RIGHT ARM  Final   Special Requests BOTTLES DRAWN AEROBIC ONLY 10CC  Final   Culture NO GROWTH < 24 HOURS  Final    Report Status PENDING  Incomplete  Culture, blood (routine x 2)     Status: None (Preliminary result)   Collection Time: 10/26/2015  5:45 PM  Result Value Ref Range Status   Specimen Description BLOOD RIGHT HAND  Final   Special Requests BOTTLES DRAWN AEROBIC ONLY 10CC  Final   Culture NO GROWTH < 24 HOURS  Final   Report Status PENDING  Incomplete  Gram stain  Status: None   Collection Time: 10/28/15  3:57 AM  Result Value Ref Range Status   Specimen Description URINE, RANDOM  Final   Special Requests NONE  Final   Gram Stain   Final    FEW WBC PRESENT,BOTH PMN AND MONONUCLEAR FEW GRAM NEGATIVE RODS FEW GRAM POSITIVE COCCI IN PAIRS CRITICAL RESULT CALLED TO, READ BACK BY AND VERIFIED WITH: T PHILLIPS @0536  10/28/15 MKELLY    Report Status 10/28/2015 FINAL  Final  MRSA PCR Screening     Status: None   Collection Time: 10/28/15  6:11 AM  Result Value Ref Range Status   MRSA by PCR NEGATIVE NEGATIVE Final    Comment:        The GeneXpert MRSA Assay (FDA approved for NASAL specimens only), is one component of a comprehensive MRSA colonization surveillance program. It is not intended to diagnose MRSA infection nor to guide or monitor treatment for MRSA infections.    Studies/Results: Dg Chest Port 1 View  11/06/2015  CLINICAL DATA:  Shortness of breath. EXAM: PORTABLE CHEST 1 VIEW COMPARISON:  October 03, 2014. FINDINGS: Mild cardiomegaly is noted. No pneumothorax or significant pleural effusion is noted. Mild diffuse airspace opacities are noted which may represent edema, inflammation or possibly overlying soft tissue artifact due to body habitus. Bony thorax is unremarkable. IMPRESSION: Mild diffuse airspace opacities are noted which may represent pneumonia, edema or possibly overlying soft tissue artifact due to body habitus. Electronically Signed   By: Marijo Conception, M.D.   On: 11/02/2015 18:26   Medications: I have reviewed the patient's current medications. Scheduled  Meds: . amLODipine  10 mg Oral Daily  . antiseptic oral rinse  7 mL Mouth Rinse BID  . calcium acetate  667 mg Oral TID WC  . feeding supplement (PRO-STAT SUGAR FREE 64)  30 mL Oral TID BM  . furosemide  80 mg Intravenous BID  . heparin  5,000 Units Subcutaneous 3 times per day  . insulin aspart  0-20 Units Subcutaneous TID WC  . [START ON 10/29/2015] levofloxacin (LEVAQUIN) IV  500 mg Intravenous Q48H  . sodium bicarbonate  1,300 mg Oral TID  . sodium chloride  3 mL Intravenous Q12H   Continuous Infusions:  PRN Meds:.acetaminophen, guaiFENesin, sodium chloride Assessment/Plan: Principal Problem:   Acute renal failure superimposed on stage 4 chronic kidney disease (HCC) Active Problems:   Type II diabetes mellitus with renal manifestations, uncontrolled (HCC)   Obstructive sleep apnea   Anemia in CKD (chronic kidney disease)   Morbid obesity, BMI unknown ([place])   Essential hypertension   Chronic kidney disease (CKD), stage IV (severe) (HCC)   Physical deconditioning   Nephrotic syndrome   Hyperparathyroidism, secondary ([place])   Pressure ulcer  Acute on chronic kidney disease w/ metabolic acidosis - h/o nephrotic syndrome in setting of stage 4 CKD, so far not deemed a dialysis candidate. Worsening SOB, leg swelling, decreased UOP x 2 wks. Labs show Cr 10.7 (baseline ~5), AG 18, HCO3 8, BNP 131. He has not been taking his renal supplements at home. Appears to have gained ~90 lbs in the past year. However, the weight is inaccurate.  Likely volume overload  from worsening renal failure vs heart failure?  UA showing moderate leukocytes    -consulted nephrology-    -->  they still think pt is not a dialysis candidate due to him being bedbound and Dr Augustin Coupe feels strongly that putting him on dialysis will not improve his quality of life. However,  the patient and his family indicated to me strongly that they would want to have dialysis and that they are considering dialysis.    -Furosemide 80mg   IV (takes 80 PO at home); given another lasix 80 mg iv today -strict I&o  -Re-start home phoslo, NaHCO3  -ordered ABG -Trend renal function panel, CBC -f/u PTH, iron panel   Volume overload: Pt seems to have gained 90 lbs in the last year. Ordered echo to differentiate the etiologies- possibly heart failure?  -on 2 L oxygen [employer] - diuresing with lasix 80 mg IV bid today, will titrate as needed -strict intake and output  -ordered echo    Presumed Community acquired pneumonia- 4-5 day history of viral URI-like symptoms with initial improvement, followed by worsening course over the past 1-2 weeks. Still has URI like symptoms, afebrile, did present with hypoxia and WBC 21, CXR poor quality due to body habitus, PNA vs edema. Allergic to rocephin.  -Levaquin IV every 48 hours- start day 10/29/15 -Trend daily  CBCs, BMPs -F/u cultures urinary antigens -Robitussin PRN   HTN : blood pressure on the soft side 108/52 -HOLD home amlodipine -diuresing with lasix   Normocytic anemia? 2/2 CKD -renal following -ordered anemia panel -ordered PTH  T2DM: - on home glipizide, last a1c was 7.4 in July  -hold home glipizide -check a1c  -SSI-R   Dispo: Disposition is deferred at this time, awaiting improvement of current medical problems.  Anticipated discharge in approximately 2 day(s).   The patient does have a current PCP (Tasrif Ahmed, MD) and does need an [facility] hospital follow-up appointment after discharge.  The patient does not have transportation limitations that hinder transportation to clinic appointments.  .Services Needed at time of discharge: Y = Yes, Blank = No PT:   OT:   RN:   Equipment:   Other:     LOS: 1 day   Burgess Estelle, MD 10/28/2015, 2:56 PM

## 2015-10-28 NOTE — Progress Notes (Signed)
Utilization Review Completed.  

## 2015-10-28 NOTE — Evaluation (Signed)
Physical Therapy Evaluation Patient Details Name: Adrian Khan MRN: [id number] DOB: 07-21-1976 Today's Date: 10/28/2015   History of Present Illness  Adrian Khan is a 40yo M who is permanently bedridden being ~550 lbs with insulin-dependent Type 2 DM, nephrotic syndrome in setting of CKD Stage 4, hypertension, anxiety/depression, and OSA who presents with progressively worsening cough productive of greenish sputum and shortness of breath over the past few weeks. He also says that his legs have been more swollen and making less urine than usual over the past 2 weeks, he has felt generalized malaise, post-tussive emesis x 1-2 times this week as well as generalized post-tussive myalgias.  Clinical Impression  Patient presents with severe dependencies for functional mobility as well as joint contractures, general weakness, poor tolerance to activity with acute pain and likely cardiopulmonary limitations to upright (though not able to assess this session.)  Feel he will benefit from skilled PT in the acute setting to address issues and allow d/c home with ongoing HHPT.    Follow Up Recommendations Home health PT    Equipment Recommendations  None recommended by PT    Recommendations for Other Services       Precautions / Restrictions Precautions Precautions: Fall      Mobility  Bed Mobility Overal bed mobility: Needs Assistance Bed Mobility: Rolling Rolling: Max assist         General bed mobility comments: pt reports usually able to roll to L on his own, but unable due to sore all over from coughing; attempted to reposition shoulders closer to middle of bed with max assist able to move small amount to allow L shoulder AAROM  Transfers                 General transfer comment: NT due to pt c/o pain, bedridden for awhile at home  Ambulation/Gait                Stairs            Wheelchair Mobility    Modified Rankin (Stroke Patients Only)        Balance                                             Pertinent Vitals/Pain Pain Assessment: Faces Faces Pain Scale: Hurts whole lot Pain Location: general myalgias esp with AAROM Pain Descriptors / Indicators: Sore Pain Intervention(s): Monitored during session;Repositioned    Home Living Family/patient expects to be discharged to:: Private residence Living Arrangements: Parent Available Help at Discharge: Family;Available 24 hours/day;Personal care attendant (aide 5 d/week x 1 hour 45 min for bathing) Type of Home: House Home Access: Ramped entrance     Home Layout: Able to live on main level with bedroom/bathroom;Two level Home Equipment: Walker - 2 wheels;Wheelchair - power;Bedside commode;Hand held shower head;Hospital bed Additional Comments: mother reports has bariatric bed, but his feet crammed against end of bed, needs longer bariatric bed    Prior Function Level of Independence: Needs assistance   Gait / Transfers Assistance Needed: not standing due to medical issues per mother  ADL's / Homemaking Assistance Needed: total assist for ADL's        Hand Dominance        Extremity/Trunk Assessment   Upper Extremity Assessment: RUE deficits/detail;LUE deficits/detail RUE Deficits / Details: shoulder AAROM to about 60-70 (limited testing due to lying  diagonal in bed and rail up against shoulder,) elbow and wrist AAROM limited by soft tissue and pt self limits; strength grossly 3-/5 at shoulder and 3/5 elbow     LUE Deficits / Details: shoulder AAROM 90 degrees, strength 3/5, painful with elbow and especially wrist AAROM   Lower Extremity Assessment: RLE deficits/detail;LLE deficits/detail RLE Deficits / Details: AAROM limited ankle DF about -30, knee extension -30, flexion limited by soft tissue to about 50, noted oozing skin tear medial aspect of popliteal fossa, cleansed with warm cloth; hip kept in ER, flexion and abduction and unable to bring  into midline LLE Deficits / Details: 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, knee flex approx 45, extension 0, hip flexion limited due to pannus, strength grossly 2+/5     Communication   Communication: No difficulties  Cognition Arousal/Alertness: Awake/alert Behavior During Therapy: Anxious Overall Cognitive Status: Within Functional Limits for tasks assessed                      General Comments      Exercises General Exercises - Upper Extremity Shoulder Flexion: AAROM;Both;5 reps;Supine Elbow Flexion: AAROM;Both;5 reps;Supine Elbow Extension: AAROM;Both;5 reps;Supine Wrist Flexion: AAROM;Both;5 reps;Supine Wrist Extension: AAROM;Both;5 reps;Supine General Exercises - Lower Extremity Ankle Circles/Pumps: AAROM;Both;5 reps;Supine Heel Slides: AAROM;Both;5 reps;Supine      Assessment/Plan    PT Assessment Patient needs continued PT services  PT Diagnosis Generalized weakness;Acute pain   PT Problem List Decreased range of motion;Decreased activity tolerance;Decreased mobility;Decreased strength;Obesity;Pain  PT Treatment Interventions Functional mobility training;Therapeutic activities;Therapeutic exercise;Patient/family education   PT Goals (Current goals can be found in the Care Plan section) Acute Rehab PT Goals Patient Stated Goal: To go home for rehab PT Goal Formulation: With patient/family Time For Goal Achievement: 11/11/15 Potential to Achieve Goals: Fair    Frequency Min 3X/week   Barriers to discharge        Co-evaluation               End of Session   Activity Tolerance: Patient limited by pain Patient left: in bed;with family/visitor present;with call bell/phone within reach           Time: 0920-0945 PT Time Calculation (min) (ACUTE ONLY): 25 min   Charges:   PT Evaluation $PT Eval Moderate Complexity: 1 Procedure PT Treatments $Therapeutic Exercise: 8-22 mins   PT G CodesReginia Naas 2015-11-18, 10:08  AM  Magda Kiel, PT [phone] 11/18/2015

## 2015-10-28 NOTE — ED Notes (Signed)
Wilson MD aware of pt critical Ca

## 2015-10-28 NOTE — Progress Notes (Signed)
  Echocardiogram 2D Echocardiogram has been performed.  Adrian Khan 10/28/2015, 3:51 PM

## 2015-10-28 NOTE — ED Notes (Signed)
Spoke with Redmond Pulling MD and wants floor to admin PhosLo ASAP. Becky RN aware

## 2015-10-28 NOTE — ED Notes (Signed)
Spoke with Redmond Pulling MD, asked for repeat EKG.

## 2015-10-28 NOTE — Care Management Note (Addendum)
Case Management Note  Patient Details  Name: Adrian Khan MRN: [id number] Date of Birth: [birth year]  Subjective/Objective:   Pt lives with mother, has bariatric bed, electric scooter, walker, BSC, receives Medicaid PCS 1 hr 45 min 5 days/week.  Mother states pt has been bedbound for the last two years because of a shoulder injury.  Reports that in August, PT from [facility] was able to assist pt OOB to stand but pt unable to walk.  Also reports that pt's bed @ home is not long enough and requests assistance in changing bariatric bed for a longer one.  CM contacted [facility] to inquire about bed replacement, contacted [facility] liaison about home health PT.                              Expected Discharge Plan:  Home/Self Care  Discharge planning Services  CM Consult  Status of Service:  In process, will continue to follow  Girard Cooter, RN 10/28/2015, 3:00 PM   10/28/2015 4:04 PM:  [facility] PT HAD BEEN SEEN IN April 2016 AND PHYSICAL THERAPIST HAD BEEN UNABLE TO GET HIM OOB TO STAND, IN FACT PT HAD BEEN UNWILLING TO PARTICIPATE AT ALL IN THERAPY MULTIPLE TIMES.  [facility] PHYSICAL THERAPIST FEELS HOME HEALTH PT IS NOT FEASIBLE AS THE PT DOES NOT HAVE UPPER BODY STRENGTH TO ASSIST IN GETTING OUT OF BED.  CM ALSO DISCUSSED WITH [facility], THEIR PHYSICAL THERAPIST ALSO FEELS HOME HEALTH THERAPY WOULD NOT BE FEASIBLE.

## 2015-10-29 DIAGNOSIS — Z515 Encounter for palliative care: Secondary | ICD-10-CM

## 2015-10-29 DIAGNOSIS — R5381 Other malaise: Secondary | ICD-10-CM

## 2015-10-29 DIAGNOSIS — E8770 Fluid overload, unspecified: Secondary | ICD-10-CM

## 2015-10-29 DIAGNOSIS — Z7984 Long term (current) use of oral hypoglycemic drugs: Secondary | ICD-10-CM

## 2015-10-29 DIAGNOSIS — I129 Hypertensive chronic kidney disease with stage 1 through stage 4 chronic kidney disease, or unspecified chronic kidney disease: Secondary | ICD-10-CM

## 2015-10-29 DIAGNOSIS — D649 Anemia, unspecified: Secondary | ICD-10-CM

## 2015-10-29 LAB — CBC WITH DIFFERENTIAL/PLATELET
BASOS ABS: 0 10*3/uL (ref 0.0–0.1)
BASOS PCT: 0 %
Eosinophils Absolute: 0.1 10*3/uL (ref 0.0–0.7)
Eosinophils Relative: 0 %
HEMATOCRIT: 22.6 % — AB (ref 39.0–52.0)
Hemoglobin: 7 g/dL — ABNORMAL LOW (ref 13.0–17.0)
LYMPHS PCT: 5 %
Lymphs Abs: 0.9 10*3/uL (ref 0.7–4.0)
MCH: 27.1 pg (ref 26.0–34.0)
MCHC: 31 g/dL (ref 30.0–36.0)
MCV: 87.6 fL (ref 78.0–100.0)
Monocytes Absolute: 0.9 10*3/uL (ref 0.1–1.0)
Monocytes Relative: 5 %
Neutro Abs: 16.6 10*3/uL — ABNORMAL HIGH (ref 1.7–7.7)
Neutrophils Relative %: 90 %
PLATELETS: 275 10*3/uL (ref 150–400)
RBC: 2.58 MIL/uL — AB (ref 4.22–5.81)
RDW: 14.7 % (ref 11.5–15.5)
WBC: 18.5 10*3/uL — ABNORMAL HIGH (ref 4.0–10.5)

## 2015-10-29 LAB — RENAL FUNCTION PANEL
ALBUMIN: 2.2 g/dL — AB (ref 3.5–5.0)
Anion gap: 15 (ref 5–15)
BUN: 125 mg/dL — ABNORMAL HIGH (ref 6–20)
CHLORIDE: 106 mmol/L (ref 101–111)
CO2: 12 mmol/L — ABNORMAL LOW (ref 22–32)
CREATININE: 11.04 mg/dL — AB (ref 0.61–1.24)
Calcium: 5.6 mg/dL — CL (ref 8.9–10.3)
GFR, EST AFRICAN AMERICAN: 6 mL/min — AB (ref >60)
GFR, EST NON AFRICAN AMERICAN: 5 mL/min — AB (ref >60)
Glucose, Bld: 86 mg/dL (ref 65–99)
PHOSPHORUS: 10.4 mg/dL — AB (ref 2.5–4.6)
POTASSIUM: 5.4 mmol/L — AB (ref 3.5–5.1)
Sodium: 133 mmol/L — ABNORMAL LOW (ref 135–145)

## 2015-10-29 LAB — CALCIUM, IONIZED: CALCIUM, IONIZED, SERUM: 3 mg/dL — AB (ref 4.5–5.6)

## 2015-10-29 LAB — IRON AND TIBC
IRON: 29 ug/dL — AB (ref 45–182)
Saturation Ratios: 15 % — ABNORMAL LOW (ref 17.9–39.5)
TIBC: 193 ug/dL — AB (ref 250–450)
UIBC: 164 ug/dL

## 2015-10-29 LAB — FERRITIN: FERRITIN: 192 ng/mL (ref 24–336)

## 2015-10-29 LAB — LEGIONELLA ANTIGEN, URINE

## 2015-10-29 LAB — GLUCOSE, CAPILLARY
GLUCOSE-CAPILLARY: 77 mg/dL (ref 65–99)
GLUCOSE-CAPILLARY: 114 mg/dL — AB (ref 65–99)
Glucose-Capillary: 91 mg/dL (ref 65–99)
Glucose-Capillary: 106 mg/dL — ABNORMAL HIGH (ref 65–99)

## 2015-10-29 LAB — BLOOD GAS, ARTERIAL
ACID-BASE DEFICIT: 16.7 mmol/L — AB (ref 0.0–2.0)
BICARBONATE: 10.5 meq/L — AB (ref 20.0–24.0)
DRAWN BY: 448981
O2 CONTENT: 2 L/min
O2 Saturation: 83.5 %
PCO2 ART: 31.9 mmHg — AB (ref 35.0–45.0)
PH ART: 7.143 — AB (ref 7.350–7.450)
Patient temperature: 98.6
TCO2: 11.5 mmol/L (ref 0–100)
pO2, Arterial: 57.6 mmHg — ABNORMAL LOW (ref 80.0–100.0)

## 2015-10-29 LAB — HEMOGLOBIN A1C
Hgb A1c MFr Bld: 6.9 % — ABNORMAL HIGH (ref 4.8–5.6)
Hgb A1c MFr Bld: 6.8 % — ABNORMAL HIGH (ref 4.8–5.6)
Mean Plasma Glucose: 151 mg/dL
Mean Plasma Glucose: 148 mg/dL

## 2015-10-29 LAB — UREA NITROGEN, URINE: Urea Nitrogen, Ur: 366 mg/dL

## 2015-10-29 MED ORDER — INSULIN ASPART 100 UNIT/ML ~~LOC~~ SOLN: 0.0000 [IU] | Freq: Three times a day (TID) | SUBCUTANEOUS | Status: DC

## 2015-10-29 MED ORDER — SODIUM BICARBONATE 650 MG PO TABS
1300.0000 mg | ORAL_TABLET | Freq: Four times a day (QID) | ORAL | Status: DC
  Administered 2015-10-29 (×3): 1300 mg via ORAL
  Filled 2015-10-29 (×3): qty 2

## 2015-10-29 MED ORDER — FUROSEMIDE 10 MG/ML IJ SOLN
80.0000 mg | Freq: Three times a day (TID) | INTRAMUSCULAR | Status: DC
  Administered 2015-10-29 – 2015-10-30 (×4): 80 mg via INTRAVENOUS
  Filled 2015-10-29 (×4): qty 8

## 2015-10-29 MED ORDER — LEVOFLOXACIN 500 MG PO TABS
500.0000 mg | ORAL_TABLET | ORAL | Status: DC
  Administered 2015-10-29: 500 mg via ORAL
  Filled 2015-10-29 (×2): qty 1

## 2015-10-29 NOTE — Consult Note (Signed)
Consultation Note Date: 10/29/2015   Patient Name: Adrian Khan  DOB: Aug 19, 1976  MRN: [id number]  Age / Sex: 40 y.o., male  PCP: Dellia Nims, MD Referring Physician: Aldine Contes, MD  Reason for Consultation: Establishing goals of care    Clinical Assessment/Narrative: I met with Mr. Adrian Khan today along with mother, Adrian Khan. We discuss his unfortunate circumstances to where his obesity and diabetes have led him to worsening renal failure and d/t his size and mostly bedbound status dialysis is not an option. They are very understanding that this is severe and there are really not any options for him. They are both very spiritual and Mr. Adrian Khan says that he has prayed and that there are a few phone calls he would like to make to make sure that he does not have any regrets and leaves his life in peace. He also says that he has accepted his prognosis and is not scared to die but just scared of the unknown between now and then. We talked about where to go from here and he tells me that he wants to go home tonight - I explained that this is unlikely to happen but hopefully we can plan for tomorrow. Clarified this is with the goal of keeping him happy and comfortable at home and leaving the rest in God's hands. He enjoys fishing, watching fishing/TV, comic books. They both agree to have the help from hospice at home to help with these goals. He also tells me that he wants to die at home. Adrian Khan is supportive but I think she is still praying for a miracle. I fear that he may have less time than they are prepared for and I emphasized that time is precious and limited but we did not discuss a timeframe - I'm not sure they want to know this. Adrian Khan talks about obtaining a [facility] to transport him in and further planning to care for him but she also acknowledges the need for hospice and that he is at end of life but that she is still praying  for more time. She says she will respect his wishes.   Contacts/Participants in Discussion: Primary Decision Maker: Self   Relationship to Patient Mother Adrian Khan HCPOA: yes   SUMMARY OF RECOMMENDATIONS - DNR - Home with hospice tomorrow, wants to die at home - He says he would like to go fishing one more time  Code Status/Advance Care Planning: DNR    Code Status Orders        Start     Ordered   10/29/15 1509  Do not attempt resuscitation (DNR)   Continuous    Question Answer Comment  In the event of cardiac or respiratory ARREST Do not call a "code blue"   In the event of cardiac or respiratory ARREST Do not perform Intubation, CPR, defibrillation or ACLS   In the event of cardiac or respiratory ARREST Use medication by any route, position, wound care, and other measures to relive pain and suffering. May use oxygen, suction and manual treatment of airway obstruction as needed for comfort.      10/29/15 1509    Code Status History    Date Active Date Inactive Code Status Order ID Comments User Context   10/28/2015  2:29 AM 10/29/2015  3:09 PM Full Code [id number]  Francesca Oman, DO ED   12/24/2014  6:41 PM 12/27/2014  6:54 PM Full Code [id number]  Lucious Groves, DO Inpatient   10/03/2014  6:29 PM 10/07/2014 11:44  PM Full Code [id number]  Francesca Oman, DO Inpatient   09/19/2013  1:44 PM 09/20/2013  8:11 PM Full Code [id number]  Newt Minion, MD Inpatient   08/18/2013  5:23 PM 08/23/2013  5:43 PM Full Code [id number]  Newt Minion, MD Inpatient   08/17/2013 11:03 PM 08/18/2013  5:23 PM Full Code [id number]  Etta Quill, DO ED   07/25/2013 12:12 AM 08/02/2013  9:10 PM Full Code [id number]  Donne Hazel, MD ED   09/22/2012  3:26 PM 09/25/2012  5:01 PM Full Code [id number]  Evlyn Clines, RN Inpatient       Symptom Management:   SOB/dyspnea/edema: Oxygen to be delivered by hospice at home. Hospice may administer IM Lasix at home for comfort. Will likely need to d/c home with foley catheter.    He says the bicarbonate causes abd pain - may need to re-evaluate if he wishes to continue this. He may d/c once home.   Congestion: Continue Levaquin for infection.   Palliative Prophylaxis:   Bowel Regimen, Frequent Pain Assessment and Turn Reposition  Additional Recommendations (Limitations, Scope, Preferences):  Avoid Hospitalization  Psycho-social/Spiritual:  Support System: Strong Desire for further Chaplaincy support:yes Additional Recommendations: Caregiving  Support/Resources and Education on Hospice  Prognosis: < 6 months. Likely days to weeks.   Discharge Planning: Home with Hospice   Chief Complaint/ Primary Diagnoses: Present on Admission:  . Acute renal failure superimposed on stage 4 chronic Khan disease ([place]) . Type II diabetes mellitus with renal manifestations, uncontrolled ([place]) . Morbid obesity, BMI unknown ([place]) . Essential hypertension . Hyperparathyroidism, secondary ([place]) . Anemia in CKD (chronic Khan disease) . Obstructive sleep apnea . Physical deconditioning . Chronic Khan disease (CKD), stage IV (severe) ([place])  I have reviewed the medical record, interviewed the patient and family, and examined the patient. The following aspects are pertinent.  Past Medical History  Diagnosis Date  . Shortness of breath     with exertion  . Anxiety   . Mental disorder   . Depression   . GERD (gastroesophageal reflux disease)   . Constipation   . Neuropathy (HCC)     feet, hands  . Obesity   . Hx MRSA infection   . Sleep apnea     "does not wear mask" (12/24/2014)  . History of blood transfusion     "related to foot surgery"  . History of stomach ulcers   . Septic arthritis of shoulder, left ([place])   . Chronic Khan disease     "here w/Khan problems today" (12/24/2014)   Social History   Social History  . Marital Status: Single    Spouse Name: N/A  . Number of Children: N/A  . Years of Education: N/A   Social History Main Topics  .  Smoking status: Never Smoker   . Smokeless tobacco: Never Used  . Alcohol Use: No  . Drug Use: No  . Sexual Activity: Not Currently   Other Topics Concern  . None   Social History Narrative   History reviewed. No pertinent family history. Scheduled Meds: . antiseptic oral rinse  7 mL Mouth Rinse BID  . calcium acetate  667 mg Oral TID WC  . feeding supplement (PRO-STAT SUGAR FREE 64)  30 mL Oral TID BM  . furosemide  80 mg Intravenous TID AC  . heparin  5,000 Units Subcutaneous 3 times per day  . insulin aspart  0-9 Units Subcutaneous TID WC  . levofloxacin  500 mg Oral  Q48H  . sodium bicarbonate  1,300 mg Oral QID  . sodium chloride  3 mL Intravenous Q12H   Continuous Infusions:  PRN Meds:.acetaminophen, guaiFENesin, sodium chloride Medications Prior to Admission:  Prior to Admission medications   Medication Sig Start Date End Date Taking? Authorizing Provider  acetaminophen (TYLENOL) 500 MG tablet Take 500 mg by mouth every 6 (six) hours as needed for mild pain.   Yes Historical Provider, MD  amLODipine (NORVASC) 10 MG tablet TAKE 1 TABLET (10 MG TOTAL) BY MOUTH DAILY. 05/05/15  Yes Tasrif Ahmed, MD  furosemide (LASIX) 80 MG tablet Take 1 tablet (80 mg total) by mouth daily. 05/05/15  Yes Tasrif Ahmed, MD  glipiZIDE (GLUCOTROL) 5 MG tablet Take 1 tablet (5 mg total) by mouth daily before breakfast. 05/05/15 05/04/16 Yes Tasrif Ahmed, MD  hydrALAZINE (APRESOLINE) 25 MG tablet Take 1 tablet (25 mg total) by mouth 3 (three) times daily. Patient taking differently: Take 25 mg by mouth daily.  05/05/15  Yes Tasrif Ahmed, MD  Vitamin D, Ergocalciferol, (DRISDOL) 50000 UNITS CAPS capsule Take 1 capsule (50,000 Units total) by mouth every 7 (seven) days. 05/06/15  Yes Tasrif Ahmed, MD  B-D ULTRAFINE III SHORT PEN 31G X 8 MM MISC USE AS DIRECTED WITH FLEXPEN 11/14/14   Tasrif Ahmed, MD  Blood Glucose Monitoring Suppl (ONE TOUCH ULTRA MINI) W/DEVICE KIT Check blood sugar one time a day 10/01/14    Otho Bellows, MD  calcium acetate (PHOSLO) 667 MG capsule TAKE 3 CAPSULES (2,001 MG TOTAL) BY MOUTH 3 (THREE) TIMES DAILY WITH MEALS. Patient not taking: Reported on 11/16/2015 03/10/15   Dellia Nims, MD  ferrous gluconate (FERGON) 324 MG tablet Take 1 tablet (324 mg total) by mouth 2 (two) times daily with a meal. Patient not taking: Reported on 11/14/2015 12/27/14   Luan Moore, MD  fluticasone [facility]) 50 MCG/ACT nasal spray Place 1 spray into both nostrils daily. Patient not taking: Reported on 11/13/2015 10/07/14   Luan Moore, MD  nystatin (MYCOSTATIN/NYSTOP) 100000 UNIT/GM POWD Apply to affected areas on the lower abdomen/groin area twice daily. Keep skin dry. Patient not taking: Reported on 12/24/2014 10/16/14   Blain Pais, MD  ONE TOUCH ULTRA TEST test strip CHECK BLOOD SUGAR ONE TIME A DAY 10/24/15   Dellia Nims, MD  [facility] DELICA LANCETS FINE MISC Check blood sugar one time a day 10/01/14   Otho Bellows, MD  sodium bicarbonate 650 MG tablet TAKE 2 TABLETS BY MOUTH 3 TIMES A DAY Patient not taking: Reported on 10/23/2015 07/08/15   Dellia Nims, MD   Allergies  Allergen Reactions  . Sulfonamide Derivatives Other (See Comments)    cramps  . Rocephin [Ceftriaxone] Hives and Itching    Review of Systems  Respiratory: Positive for shortness of breath.   Cardiovascular: Positive for leg swelling.  Gastrointestinal: Positive for abdominal pain.  Genitourinary: Positive for difficulty urinating.    Physical Exam  Constitutional: He is oriented to person, place, and time. He appears well-developed. He appears ill.  obese  Cardiovascular: Normal rate.   Respiratory: Effort normal. No accessory muscle usage. No tachypnea. No respiratory distress.  Breathing slightly labored but close to baseline.   GI: Soft.  Neurological: He is alert and oriented to person, place, and time.    Vital Signs: BP 111/73 mmHg  Pulse 79  Temp(Src) 97.5 F (36.4 C) (Oral)  Resp 27  Ht 5'  11" (1.803 m)  Wt 256.282 kg (565 lb)  BMI 78.84  kg/m2  SpO2 93%  SpO2: SpO2: 93 % O2 Device:SpO2: 93 % O2 Flow Rate: .O2 Flow Rate (L/min): 2 L/min  IO: Intake/output summary:  Intake/Output Summary (Last 24 hours) at 10/29/15 1512 Last data filed at 10/29/15 0900  Gross per 24 hour  Intake    363 ml  Output    200 ml  Net    163 ml    LBM: Last BM Date: 10/29/15 Baseline Weight: Weight: (!) 247.21 kg (545 lb) Most recent weight: Weight: (!) 256.282 kg (565 lb)      Palliative Assessment/Data:  Flowsheet Rows        Most Recent Value   Intake Tab    Referral Department  -- [internal medicine]   Unit at Time of Referral  Intermediate Care Unit   Palliative Care Primary Diagnosis  Nephrology   Date Notified  10/29/15   Palliative Care Type  New Palliative care   Reason for referral  Clarify Goals of Care   Date of Admission  10/24/2015   # of days IP prior to Palliative referral  2   Clinical Assessment    Psychosocial & Spiritual Assessment    Palliative Care Outcomes       Additional Data Reviewed:  CBC:    Component Value Date/Time   WBC 18.5* 10/29/2015 0746   HGB 7.0* 10/29/2015 0746   HCT 22.6* 10/29/2015 0746   PLT 275 10/29/2015 0746   MCV 87.6 10/29/2015 0746   NEUTROABS 16.6* 10/29/2015 0746   LYMPHSABS 0.9 10/29/2015 0746   MONOABS 0.9 10/29/2015 0746   EOSABS 0.1 10/29/2015 0746   BASOSABS 0.0 10/29/2015 0746   Comprehensive Metabolic Panel:    Component Value Date/Time   NA 133* 10/29/2015 0746   K 5.4* 10/29/2015 0746   CL 106 10/29/2015 0746   CO2 12* 10/29/2015 0746   BUN 125* 10/29/2015 0746   CREATININE 11.04* 10/29/2015 0746   CREATININE 5.70* 05/05/2015 1343   GLUCOSE 86 10/29/2015 0746   CALCIUM 5.6* 10/29/2015 0746   CALCIUM 6.6* 10/05/2014 0420   AST 10* 11/01/2015 2133   ALT 8* 11/11/2015 2133   ALKPHOS 126 11/06/2015 2133   BILITOT 0.5 11/15/2015 2133   PROT 8.0 11/12/2015 2133   ALBUMIN 2.2* 10/29/2015 0746     Time  In: 1340 Time Out: 1500 Time Total: 16mn Greater than 50%  of this time was spent counseling and coordinating care related to the above assessment and plan.  Signed by: PPershing Proud NP  APershing Proud NP  13/47/4259 3:12 PM  Please contact Palliative Medicine Team phone at [phone] questions and concerns.

## 2015-10-29 NOTE — Progress Notes (Signed)
CRITICAL VALUE ALERT  Critical value received: Calcium 5.6  Date of notification:  10/29/15  Time of notification:  0834  Critical value read back:Yes.    Nurse who received alert:  Steadman Prosperi,RN  MD notified:  Dr. Tiburcio Pea.    No new orders at this time.

## 2015-10-29 NOTE — Progress Notes (Signed)
Attempt to insert foley catheter unsuccessful due to resistance met when attempting to advance the catheter and patient intolerance.  Doctor Tiburcio Pea paged.  Awaiting advice.

## 2015-10-29 NOTE — Care Management Note (Signed)
Case Management Note  Patient Details  Name: Adrian Khan MRN: [id number] Date of Birth: 06-30-1976  Subjective/Objective:    Pt has chosen to discharge home with [facility], mother, who is primary caregiver agrees.  Referral faxed to liaison.                        Expected Discharge Plan:  [place]  Discharge planning Services  CM Consult  HH Arranged:  RN, Social Work [facility] Agency:  Other - See comment  Status of Service:  In process, will continue to follow  Girard Cooter, RN 10/29/2015, 3:38 PM

## 2015-10-29 NOTE — Progress Notes (Signed)
IMTS Night Team Progress Note  Discussed with nursing about pt's skin breakdown, pt allergic to previously-used creams  -Recommend applying Interdry fabric over affected areas

## 2015-10-29 NOTE — Progress Notes (Signed)
RT attempted morning ABG but patient refused. RT explained the concepts and importance of an ABG and patient continued to refuse.

## 2015-10-29 NOTE — Progress Notes (Signed)
[facility] Progress Note    Assessment/ Plan:   1. 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 Posey Pronto in the office. Unfortunately he is not a candidate for dialysis given that he has been bedbound for 2 years and is needle phobic. 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 - Medical management given he is not a candidate for long term dialysis (he will need HCO3 therapy). - Will check phos to determine if he needs a binder and will check a iPTH (goal 150-300). - Eventually may need a palliative care consult as there is not much medically that can be done for the patient sans dialysis.  * I spoke at length with the pt and mother this morning. The only place that could accommodate him for long term chronic dialysis would be at an LTAC/nursing home that offered that service. There is one within a 100 mile radius but the patient is adamant that he doesn't want to live like that. He is asking to go home. Unfortunately the outpatient dialysis centers are not equipped to be able to dialyze him. Transportation, chairs/beds preclude dialysis in [place]. In addition he is not a good dialysis candidate as everyone agrees. *  * I would highly recommend having palliative see him and ensure he has adequate support (ie home hospice) to be comfortable at home. *  2. Anemia - will check an iron panel and replete if needed prior to starting ESA. 3. Metabolic acidosis from the advanced renal failure - really should start HCO3 therapy but this will also worsen the hypervolemia given the Na load. I'm not sure  I would recommend starting as this may make managing his fluid status even difficult. 4. Dyspnea - volume overload, cardiac asthma? --> no e/o PNA on CXR. 5. DM 6. HTN - fairly well controlled with the current regimen.  Subjective:   He feels he's voiding and  breathing is somewhat more comfortable. Tolerating PO's and no events overnight. Mom is bedside.   Objective:   BP 108/58 mmHg  Pulse 87  Temp(Src) 97.5 F (36.4 C) (Oral)  Resp 26  Ht 5\' 11"  (1.803 m)  Wt 256.282 kg (565 lb)  BMI 78.84 kg/m2  SpO2 98%  Intake/Output Summary (Last 24 hours) at 10/29/15 0814 Last data filed at 10/29/15 0452  Gross per 24 hour  Intake    603 ml  Output    300 ml  Net    303 ml   Weight change: 9.072 kg (20 lb)  Physical Exam: General appearance: alert and appears stated age Head: Normocephalic, without obvious abnormality, atraumatic Resp: Poor air movement Chest wall: no tenderness Cardio: distant heart sounds; regular rate and rhythm, S1, S2 normal, no murmur, click, rub or gallop GI: soft, non-tender; bowel sounds normal; no masses, no organomegaly Extremities: edema 2+, brawny Neurologic: Grossly normal  Imaging: Dg Chest Port 1 View  11/03/2015  CLINICAL DATA:  Shortness of breath. EXAM: PORTABLE CHEST 1 VIEW COMPARISON:  October 03, 2014. FINDINGS: Mild cardiomegaly is noted. No pneumothorax or significant pleural effusion is noted. Mild diffuse airspace opacities are noted which may represent edema, inflammation or possibly overlying soft tissue artifact due to body habitus. Bony thorax is unremarkable. IMPRESSION: Mild diffuse airspace opacities are noted which may represent pneumonia, edema or possibly overlying soft tissue artifact due to body habitus. Electronically Signed   By: Jeneen Rinks  Murlean Caller, M.D.   On: 11/03/2015 18:26    Labs: BMET  Recent Labs Lab 11/15/2015 1755 10/28/15 0349  NA 132* 134*  K 5.0 5.1  CL 106 107  CO2 8* 10*  GLUCOSE 188* 106*  BUN 118* 119*  CREATININE 10.72* 10.94*  CALCIUM 6.0* 5.7*  PHOS  --  9.8*   CBC  Recent Labs Lab 10/24/2015 1755 10/28/15 0350  WBC 21.3* 20.2*  NEUTROABS  --  18.0*  HGB 7.9* 7.1*  HCT 25.5* 22.4*  MCV 88.5 87.8  PLT 278 260    Medications:    . antiseptic  oral rinse  7 mL Mouth Rinse BID  . calcium acetate  667 mg Oral TID WC  . feeding supplement (PRO-STAT SUGAR FREE 64)  30 mL Oral TID BM  . furosemide  80 mg Intravenous BID  . heparin  5,000 Units Subcutaneous 3 times per day  . insulin aspart  0-20 Units Subcutaneous TID WC  . levofloxacin (LEVAQUIN) IV  500 mg Intravenous Q48H  . sodium bicarbonate  1,300 mg Oral TID  . sodium chloride  3 mL Intravenous Q12H      Otelia Santee, MD 10/29/2015, 8:14 AM

## 2015-10-29 NOTE — Progress Notes (Signed)
Inpatient Diabetes Program Recommendations  AACE/ADA: New Consensus Statement on Inpatient Glycemic Control (2015)  Target Ranges:  Prepandial:   less than 140 mg/dL      Peak postprandial:   less than 180 mg/dL (1-2 hours)      Critically ill patients:  140 - 180 mg/dL   Review of Glycemic Control  Inpatient Diabetes Program Recommendations:  Correction (SSI): reduce Novolog to sensitive scale Thank you  Raoul Pitch BSN, RN,CDE Inpatient Diabetes Coordinator [phone] (team pager)

## 2015-10-29 NOTE — Evaluation (Signed)
Occupational Therapy Evaluation Patient Details Name: Adrian Khan MRN: [id number] DOB: [birth year] Today's Date: 10/29/2015    History of Present Illness Mr. Kroth is a 40yo M who is permanently bedridden being ~550 lbs with insulin-dependent Type 2 DM, nephrotic syndrome in setting of CKD Stage 4, hypertension, anxiety/depression, and OSA who presents with progressively worsening cough productive of greenish sputum and shortness of breath over the past few weeks. He also says that his legs have been more swollen and making less urine than usual over the past 2 weeks, he has felt generalized malaise, post-tussive emesis x 1-2 times this week as well as generalized post-tussive myalgias. Mother reports his LUE has been swollen for about the last month   Clinical Impression   This 40 yo male admitted with above presents to acute OT with deficits below affecting his ability to help with his care as he was pta. He will benefit from acute OT with follow up [place] (unless progress is slow and family cannot manage him at home then he may need SNF)    Follow Up Recommendations  Other (comment) (however if progress in slow will need consider SNF))    Equipment Recommendations  None recommended by OT       Precautions / Restrictions Restrictions Weight Bearing Restrictions: No      Mobility Bed Mobility Overal bed mobility: Needs Assistance Bed Mobility: Rolling Rolling:  (total A +6 (3 on each side for ease of rolling and safety for staff))         General bed mobility comments: Pt and mom report up until about a week ago he could roll himself left and right in bed using momentum and "throwing" one leg over the other one. Today pt total A +2 to scoot up in bed due to Trendelenberg not  working and body habitus          ADL Overall ADL's : Needs assistance/impaired                                       General ADL Comments: total A at this time due to body  habitus and per mom excessive extra fluid as well as bed is not suiting pt very well               Pertinent Vitals/Pain Pain Assessment: Faces Faces Pain Scale: Hurts whole lot Pain Location: generally all over with increasing with any ROM Pain Descriptors / Indicators: Sore;Grimacing;Aching;Tightness Pain Intervention(s): Monitored during session;Repositioned     Hand Dominance Right   Extremity/Trunk Assessment Upper Extremity Assessment Upper Extremity Assessment: RUE deficits/detail;LUE deficits/detail RUE Deficits / Details: supine: shoulder AAROM to about 90, elbow and wrist full AROM limited by soft tissue; strength grossly 3/5 throughout RUE Coordination: decreased fine motor;decreased gross motor LUE Deficits / Details: shoulder AAROM 90 degrees--but very difficult due to extremem swelling compared to RUE, strength grossly 2-/5  LUE Coordination: decreased fine motor;decreased gross motor           Communication Communication Communication: No difficulties   Cognition Arousal/Alertness: Awake/alert Behavior During Therapy: WFL for tasks assessed/performed Overall Cognitive Status: Within Functional Limits for tasks assessed                                Home Living Family/patient expects to be discharged to:: Private residence Living Arrangements:  Parent Available Help at Discharge: Family;Available 24 hours/day;Personal care attendant (aie 5 days a week for bathing 1hour and 45 minutes) Type of Home: House Home Access: Ramped entrance     Home Layout: Able to live on main level with bedroom/bathroom;Two level               Home Equipment: Walker - 2 wheels;Wheelchair - power;Bedside commode;Hand held shower head;Hospital bed;Adaptive equipment Adaptive Equipment: Reacher;Sock aid Additional Comments: mother reports has bariatric bed, but his feet crammed against end of bed, needs longer bariatric bed      Prior Functioning/Environment  Level of Independence: Needs assistance  Gait / Transfers Assistance Needed: not standing due to medical issues per mother--was going to try and have pt sit EOB the Friday before admission, but he was getting winded just rolling in bed ADL's / Homemaking Assistance Needed: mother reports he had gotten to the point he could do some of his bathing and dressing bed level with AE Communication / Swallowing Assistance Needed: Independent      OT Diagnosis: Generalized weakness;Acute pain   OT Problem List: Decreased strength;Decreased range of motion;Obesity;Decreased activity tolerance;Impaired balance (sitting and/or standing);Pain;Impaired UE functional use   OT Treatment/Interventions: Patient/family education;Therapeutic exercise;Therapeutic activities    OT Goals(Current goals can be found in the care plan section) Acute Rehab OT Goals Patient Stated Goal: To go home for rehab OT Goal Formulation: With patient/family Time For Goal Achievement: 11/12/15 Potential to Achieve Goals: Fair  OT Frequency: Min 2X/week              End of Session    Activity Tolerance: Patient limited by pain Patient left: in bed;with call bell/phone within reach;with family/visitor present   Time: 1100-1120 OT Time Calculation (min): 20 min Charges:  OT General Charges $OT Visit: 1 Procedure OT Evaluation $OT Eval Moderate Complexity: 1 Procedure  Almon Register [id number] 10/29/2015, 11:52 AM

## 2015-10-29 NOTE — Progress Notes (Signed)
Subjective:  Patient seen and examined at bedside. He says he was continuing to feel short of breath but was in an uncomfortable position. He had initially refused ABG. He was not able to get the foley catheter . Urine output was not measured overnight. He also requested bariatric bed. Later on ABG was done.  Patient's mother said that they accepted the fact that he was probably not a dialysis candidate and goal would be symptom management now.     Objective: Vital signs in last 24 hours: Filed Vitals:   10/29/15 0453 10/29/15 0822 10/29/15 1208 10/29/15 1705  BP:  121/58 111/73 111/59  Pulse:  88 79 82  Temp:  97.3 F (36.3 C) 97.5 F (36.4 C) 97.3 F (36.3 C)  TempSrc:  Oral Oral   Resp:  20 27 29   Height:      Weight: 565 lb (256.282 kg)     SpO2:  97% 93% 97%   Weight change: 20 lb (9.072 kg)  Intake/Output Summary (Last 24 hours) at 10/29/15 1846 Last data filed at 10/29/15 1315  Gross per 24 hour  Intake    483 ml  Output    200 ml  Net    283 ml   General: Vital signs reviewed. morbidly obese patient lying uncomfortably in bed, on 2 L oxygen , mother at bedside Cardiovascular: difficult to appreciate cardiac sounds  Pulmonary/Chest: difficult to auscultate lung sounds Abdominal: Soft, obese,  non-tender, non-distended, BS + Extremities: severe 3+ lower extremity edema extending upto thighs, pulses difficult to palpate but present  Skin: Warm, dry and intact. No rashes or erythema.   Lab Results: Results for orders placed or performed during the hospital encounter of 10/26/2015 (from the past 24 hour(s))  Glucose, capillary     Status: Abnormal   Collection Time: 10/28/15 10:21 PM  Result Value Ref Range   Glucose-Capillary 64 (L) 65 - 99 mg/dL  Glucose, capillary     Status: None   Collection Time: 10/28/15 11:21 PM  Result Value Ref Range   Glucose-Capillary 81 65 - 99 mg/dL  Iron and TIBC     Status: Abnormal   Collection Time: 10/29/15  7:46 AM    Result Value Ref Range   Iron 29 (L) 45 - 182 ug/dL   TIBC 193 (L) 250 - 450 ug/dL   Saturation Ratios 15 (L) 17.9 - 39.5 %   UIBC 164 ug/dL  Ferritin     Status: None   Collection Time: 10/29/15  7:46 AM  Result Value Ref Range   Ferritin 192 24 - 336 ng/mL  CBC with Differential/Platelet     Status: Abnormal   Collection Time: 10/29/15  7:46 AM  Result Value Ref Range   WBC 18.5 (H) 4.0 - 10.5 K/uL   RBC 2.58 (L) 4.22 - 5.81 MIL/uL   Hemoglobin 7.0 (L) 13.0 - 17.0 g/dL   HCT 22.6 (L) 39.0 - 52.0 %   MCV 87.6 78.0 - 100.0 fL   MCH 27.1 26.0 - 34.0 pg   MCHC 31.0 30.0 - 36.0 g/dL   RDW 14.7 11.5 - 15.5 %   Platelets 275 150 - 400 K/uL   Neutrophils Relative % 90 %   Neutro Abs 16.6 (H) 1.7 - 7.7 K/uL   Lymphocytes Relative 5 %   Lymphs Abs 0.9 0.7 - 4.0 K/uL   Monocytes Relative 5 %   Monocytes Absolute 0.9 0.1 - 1.0 K/uL   Eosinophils Relative 0 %  Eosinophils Absolute 0.1 0.0 - 0.7 K/uL   Basophils Relative 0 %   Basophils Absolute 0.0 0.0 - 0.1 K/uL  Renal function panel     Status: Abnormal   Collection Time: 10/29/15  7:46 AM  Result Value Ref Range   Sodium 133 (L) 135 - 145 mmol/L   Potassium 5.4 (H) 3.5 - 5.1 mmol/L   Chloride 106 101 - 111 mmol/L   CO2 12 (L) 22 - 32 mmol/L   Glucose, Bld 86 65 - 99 mg/dL   BUN 125 (H) 6 - 20 mg/dL   Creatinine, Ser 11.04 (H) 0.61 - 1.24 mg/dL   Calcium 5.6 (LL) 8.9 - 10.3 mg/dL   Phosphorus 10.4 (H) 2.5 - 4.6 mg/dL   Albumin 2.2 (L) 3.5 - 5.0 g/dL   GFR calc non Af Amer 5 (L) >60 mL/min   GFR calc Af Amer 6 (L) >60 mL/min   Anion gap 15 5 - 15  Glucose, capillary     Status: None   Collection Time: 10/29/15  8:40 AM  Result Value Ref Range   Glucose-Capillary 77 65 - 99 mg/dL  Glucose, capillary     Status: None   Collection Time: 10/29/15 12:04 PM  Result Value Ref Range   Glucose-Capillary 91 65 - 99 mg/dL  Blood gas, arterial     Status: Abnormal   Collection Time: 10/29/15  3:35 PM  Result Value Ref Range    O2 Content 2.0 L/min   Delivery systems NASAL CANNULA    pH, Arterial 7.143 (LL) 7.350 - 7.450   pCO2 arterial 31.9 (L) 35.0 - 45.0 mmHg   pO2, Arterial 57.6 (L) 80.0 - 100.0 mmHg   Bicarbonate 10.5 (L) 20.0 - 24.0 mEq/L   TCO2 11.5 0 - 100 mmol/L   Acid-base deficit 16.7 (H) 0.0 - 2.0 mmol/L   O2 Saturation 83.5 %   Patient temperature 98.6    Collection site RIGHT RADIAL    Drawn by [id number]    Allens test (pass/fail) PASS PASS  Glucose, capillary     Status: Abnormal   Collection Time: 10/29/15  5:09 PM  Result Value Ref Range   Glucose-Capillary 114 (H) 65 - 99 mg/dL    Micro Results: Recent Results (from the past 240 hour(s))  Culture, blood (routine x 2)     Status: None (Preliminary result)   Collection Time: 10/23/2015  5:35 PM  Result Value Ref Range Status   Specimen Description BLOOD RIGHT ARM  Final   Special Requests BOTTLES DRAWN AEROBIC ONLY 10CC  Final   Culture NO GROWTH 2 DAYS  Final   Report Status PENDING  Incomplete  Culture, blood (routine x 2)     Status: None (Preliminary result)   Collection Time: 10/24/2015  5:45 PM  Result Value Ref Range Status   Specimen Description BLOOD RIGHT HAND  Final   Special Requests BOTTLES DRAWN AEROBIC ONLY 10CC  Final   Culture NO GROWTH 2 DAYS  Final   Report Status PENDING  Incomplete  Gram stain     Status: None   Collection Time: 10/28/15  3:57 AM  Result Value Ref Range Status   Specimen Description URINE, RANDOM  Final   Special Requests NONE  Final   Gram Stain   Final    FEW WBC PRESENT,BOTH PMN AND MONONUCLEAR FEW GRAM NEGATIVE RODS FEW GRAM POSITIVE COCCI IN PAIRS CRITICAL RESULT CALLED TO, READ BACK BY AND VERIFIED WITH: T PHILLIPS @[id number]  10/28/15 MKELLY  Report Status 10/28/2015 FINAL  Final  MRSA PCR Screening     Status: None   Collection Time: 10/28/15  6:11 AM  Result Value Ref Range Status   MRSA by PCR NEGATIVE NEGATIVE Final    Comment:        The GeneXpert MRSA Assay (FDA approved for NASAL  specimens only), is one component of a comprehensive MRSA colonization surveillance program. It is not intended to diagnose MRSA infection nor to guide or monitor treatment for MRSA infections.    Studies/Results: No results found. Medications: I have reviewed the patient's current medications. Scheduled Meds: . antiseptic oral rinse  7 mL Mouth Rinse BID  . calcium acetate  667 mg Oral TID WC  . feeding supplement (PRO-STAT SUGAR FREE 64)  30 mL Oral TID BM  . furosemide  80 mg Intravenous TID AC  . heparin  5,000 Units Subcutaneous 3 times per day  . insulin aspart  0-9 Units Subcutaneous TID WC  . levofloxacin  500 mg Oral Q48H  . sodium bicarbonate  1,300 mg Oral QID  . sodium chloride  3 mL Intravenous Q12H   Continuous Infusions:  PRN Meds:.acetaminophen, guaiFENesin, sodium chloride Assessment/Plan: Principal Problem:   Acute renal failure superimposed on stage 4 chronic kidney disease (HCC) Active Problems:   Type II diabetes mellitus with renal manifestations, uncontrolled (HCC)   Obstructive sleep apnea   Anemia in CKD (chronic kidney disease)   Morbid obesity, BMI unknown ([place])   Essential hypertension   Chronic kidney disease (CKD), stage IV (severe) (HCC)   Physical deconditioning   Nephrotic syndrome   Hyperparathyroidism, secondary (HCC)   Pressure ulcer  Acute on chronic kidney disease w/ metabolic acidosis - h/o nephrotic syndrome in setting of stage 4 CKD, so far not deemed a dialysis candidate. Worsening SOB, leg swelling, decreased UOP x 2 wks. Labs show Cr 10.7 (baseline ~5), AG 18, HCO3 8, BNP 131. He has not been taking his renal supplements at home. Appears to have gained ~90 lbs in the past year. However, the weight is inaccurate.  Likely volume overload  from worsening renal failure vs heart failure?  UA showing moderate leukocytes. ABG today shows severe metabolic acidosis and low pco2.    -consulted nephrology- After nephrology discussed the  case, they feel that pt is still not a dialysis candidate. So consulted palliative care -changed to lasix 80 mg IV tid today -strict I&o - still unable to get a foley catheter so they will estimate, possibly coude catherer ? - home phoslo, NaHCO3- increased bicarb to 1300 mg 4 times a day- renal does not recommend starting bicarb IV as this will worsen his hypervolemia and the fluid status will be difficult to manage.   -Trend bmp , cbc -f/u PTH,  Volume overload: Pt seems to have gained 90 lbs in the last year. Ordered echo to differentiate the etiologies- Echo showed ef of 55% however, pt has anasarca and severe pitting edema.   -on 2 L oxygen [place] - diuresing with lasix 80 mg IV tid today, will titrate as needed -intake and output    Presumed Community acquired pneumonia- 4-5 day history of viral URI-like symptoms with initial improvement, followed by worsening course over the past 1-2 weeks. Still has URI like symptoms, afebrile, did present with hypoxia and WBC 21, CXR poor quality due to body habitus, PNA vs edema. Allergic to rocephin.  -Levaquin IV every 48 hours- start day 10/29/15 -Trend daily  CBCs, BMPs -Robitussin PRN  HTN : blood pressure on the soft side 108/52 -HOLD home amlodipine -diuresing with lasix   Normocytic anemia? 2/2 CKD. Ferritin normal. Iron mildly low -renal following -ordered PTH  T2DM: - on home glipizide, last a1c was 7.4 in July- had a hypoglycemic episode overnight. A1c normal.  -hold home glipizide -SSI-S  Disposition: palliative care consulted and patient has chosen hospitce- home vs inpatient- im not sure how home hospice will be for him given his weight and symptom management will be difficult at home.   Dispo: Disposition is deferred at this time, awaiting improvement of current medical problems.  Anticipated discharge in approximately 2 day(s).   The patient does have a current PCP (Tasrif Ahmed, MD) and does need an [facility] hospital follow-up  appointment after discharge.  The patient does not have transportation limitations that hinder transportation to clinic appointments.  .Services Needed at time of discharge: Y = Yes, Blank = No PT:   OT:   RN:   Equipment:   Other:     LOS: 2 days   Burgess Estelle, MD 10/29/2015, 6:46 PM

## 2015-10-29 NOTE — Progress Notes (Addendum)
CRITICAL VALUE ALERT  Critical value received:  Critical ABG  Date of notification:  10/29/15  Time of notification:  [id number]  Critical value read back:Yes.    Nurse who received alert:  Nyoka Lint  MD notified (1st page):  Dr. Tiburcio Pea notified.  No new orders at this time.

## 2015-10-29 NOTE — Progress Notes (Signed)
Patient voided once this shift. Dr. Tiburcio Pea and Dr. Denton Brick made aware.  Will continue IV lasix as ordered.

## 2015-10-29 NOTE — Progress Notes (Signed)
Pt refused to allow the ABG draw.  Explained to the pt of the importance of the procedure.  Pt declined again.

## 2015-10-29 NOTE — Progress Notes (Signed)
Baltazar Najjar, NP notified that patient has not voided this shift, bladder scan showed 30 ml.

## 2015-10-30 DIAGNOSIS — N185 Chronic kidney disease, stage 5: Secondary | ICD-10-CM

## 2015-10-30 DIAGNOSIS — Z515 Encounter for palliative care: Secondary | ICD-10-CM | POA: Insufficient documentation

## 2015-10-30 LAB — VITAMIN D 25 HYDROXY (VIT D DEFICIENCY, FRACTURES): VIT D 25 HYDROXY: 7 ng/mL — AB (ref 30.0–100.0)

## 2015-10-30 LAB — CBC
HCT: 22.2 % — ABNORMAL LOW (ref 39.0–52.0)
Hemoglobin: 7.2 g/dL — ABNORMAL LOW (ref 13.0–17.0)
MCH: 28 pg (ref 26.0–34.0)
MCHC: 32.4 g/dL (ref 30.0–36.0)
MCV: 86.4 fL (ref 78.0–100.0)
Platelets: 372 10*3/uL (ref 150–400)
RBC: 2.57 MIL/uL — ABNORMAL LOW (ref 4.22–5.81)
RDW: 15.1 % (ref 11.5–15.5)
WBC: 18.8 10*3/uL — ABNORMAL HIGH (ref 4.0–10.5)

## 2015-10-30 LAB — PARATHYROID HORMONE, INTACT (NO CA): PTH: 387 pg/mL — ABNORMAL HIGH (ref 15–65)

## 2015-10-30 LAB — GLUCOSE, CAPILLARY
GLUCOSE-CAPILLARY: 94 mg/dL (ref 65–99)
GLUCOSE-CAPILLARY: 90 mg/dL (ref 65–99)

## 2015-10-30 MED ORDER — FUROSEMIDE 10 MG/ML IJ SOLN: 40.0000 mg | Freq: Once | INTRAMUSCULAR | Status: DC

## 2015-10-30 MED ORDER — OXYCODONE HCL 20 MG/ML PO CONC: 5.0000 mg | ORAL | Status: AC | PRN

## 2015-10-30 MED ORDER — LORAZEPAM 0.5 MG PO TABS: 0.5000 mg | ORAL_TABLET | Freq: Three times a day (TID) | ORAL | Status: AC | PRN

## 2015-10-30 MED ORDER — OXYCODONE HCL 20 MG/ML PO CONC
5.0000 mg | ORAL | Status: DC | PRN
  Administered 2015-10-30: 5 mg via ORAL
  Filled 2015-10-30: qty 1

## 2015-11-01 LAB — CULTURE, BLOOD (ROUTINE X 2)
CULTURE: NO GROWTH
Culture: NO GROWTH

## 2015-11-04 ENCOUNTER — Ambulatory Visit: Payer: Medicare Other | Admitting: Internal Medicine

## 2015-11-19 NOTE — Progress Notes (Signed)
   11/22/2015 1700  Clinical Encounter Type  Visited With Patient and family together  Visit Type Initial;Psychological support;Spiritual support;Social support;Death  Referral From Nurse  Consult/Referral To Faith community  Spiritual Encounters  Spiritual Needs Emotional  Stress Factors  Family Stress Factors Loss;Loss of control;Major life changes   Chaplain sat with Pt's Mom and provided emotional care via prayer.

## 2015-11-19 NOTE — Progress Notes (Signed)
Called to patient room by his mother  And I noted that he was not breathing ,no breath sounds on ausculation , unable to get BP, no respirations noted MD  Jonathon Bellows was called and he immediately  Returned  and came down. Mother called Hospice and the TPAR was cancelled. A waiting further orders. Chaplain  Was called /mother until her Doristine Bosworth was able to come .Offered comfort care to family .

## 2015-11-19 NOTE — Progress Notes (Signed)
CSW consulted for [facility] transport   Patient will discharge to home Anticipated discharge date:1/12 Family notified: at bedside Transportation by [facility]-[phone]- RN to call when pt ready   CSW signing off.  Domenica Reamer, [place] Social Worker [phone]

## 2015-11-19 NOTE — Progress Notes (Signed)
Called Patient placement and was informed of not having to call the Medical Examiner and to call [facility] at [phone], place the referral  At [place]. Gave [facility] card to mother so when she decides on funeral home she can give information to thim

## 2015-11-19 NOTE — Care Management Important Message (Signed)
Important Message  Patient Details  Name: Adrian Khan MRN: [id number] Date of Birth: 03/05/1976   Medicare Important Message Given:  Yes    Nathen May 11/12/15, 12:26 PM

## 2015-11-19 NOTE — Progress Notes (Signed)
PT Cancellation Note  Patient Details Name: Adrian Khan MRN: [id number] DOB: 09-12-1976   Cancelled Treatment:    Reason Eval/Treat Not Completed: Other (comment) (MD discontinued order. )   Irwin Brakeman F 2015-11-25, 11:16 AM  [facility] [phone] (pager)

## 2015-11-19 NOTE — Progress Notes (Signed)
[facility] Progress Note    Assessment/ Plan:   1. 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 Posey Pronto in the office. Unfortunately he is not a candidate for dialysis given that he has been bedbound for 2 years and is needle phobic. 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 - Medical management given he is not a candidate for long term dialysis (he will need HCO3 therapy). - Will check phos to determine if he needs a binder and will check a iPTH (goal 150-300). - Eventually may need a palliative care consult as there is not much medically that can be done for the patient sans dialysis.  * I spoke at length with the pt and mother on 1/11. The only place that could accommodate him for long term chronic dialysis would be at an LTAC/nursing home that offered that service. There is one within a 100 mile radius but the patient is adamant that he doesn't want to live like that. He is asking to go home. Unfortunately the outpatient dialysis centers are not equipped to be able to dialyze him. Transportation, chairs/beds preclude dialysis in [place]. In addition he is not a good dialysis candidate as everyone agrees. *  * Appreciate palliative seeing him and ensure he has adequate support (ie home hospice) to be comfortable at home. *  * Mother and son were more worried this AM, asking if anything else can be done. Mother had been told by a friend the possibility of PD. I explained that he would be at high risk for complications with PD bec of the huge pannus and there's no one to do home therapy training for him. He wouldn't be able to go to the [place] [facility] home therapy training qdaily for 1-2 weeks. Without the training he wouldn't be able to receive the fluids and supplies at home. Home therapies training nurses at [facility] with Dr. Kailen Footman do not have  privilege to come perform training at [facility]. *   2. Anemia - will check an iron panel and replete if needed prior to starting ESA. 3. Metabolic acidosis from the advanced renal failure - really should start HCO3 therapy but this will also worsen the hypervolemia given the Na load. I'm not sure I would recommend starting as this may make managing his fluid status even difficult. 4. Dyspnea - volume overload, cardiac asthma? --> no e/o PNA on CXR. 5. DM 6. HTN - fairly well controlled with the current regimen.  Subjective:   He feels he's voiding and breathing is certainly more labored.  Mom is bedside and rightfully concerned.   Objective:   BP 101/61 mmHg  Pulse 77  Temp(Src) 97.6 F (36.4 C) (Axillary)  Resp 22  Ht 5\' 11"  (1.803 m)  Wt 256.282 kg (565 lb)  BMI 78.84 kg/m2  SpO2 94%  Intake/Output Summary (Last 24 hours) at 31-Oct-2015 0813 Last data filed at Oct 31, 2015 0300  Gross per 24 hour  Intake    600 ml  Output    150 ml  Net    450 ml   Weight change:   Physical Exam: General appearance: alert and appears stated age Head: Normocephalic, without obvious abnormality, atraumatic Resp: Poor air movement Chest wall: no tenderness Cardio: distant heart sounds; regular rate and rhythm, S1, S2 normal, no murmur, click, rub or gallop GI: soft, non-tender; bowel sounds normal; no  masses, no organomegaly Extremities: edema 2+, brawny Neurologic: Grossly normal  Imaging: No results found.  Labs: BMET  Recent Labs Lab 10/19/2015 1755 10/28/15 0349 10/29/15 0746  NA 132* 134* 133*  K 5.0 5.1 5.4*  CL 106 107 106  CO2 8* 10* 12*  GLUCOSE 188* 106* 86  BUN 118* 119* 125*  CREATININE 10.72* 10.94* 11.04*  CALCIUM 6.0* 5.7* 5.6*  PHOS  --  9.8* 10.4*   CBC  Recent Labs Lab 11/03/2015 1755 10/28/15 0350 10/29/15 0746 11/17/2015 0250  WBC 21.3* 20.2* 18.5* 18.8*  NEUTROABS  --  18.0* 16.6*  --   HGB 7.9* 7.1* 7.0* 7.2*  HCT 25.5* 22.4* 22.6* 22.2*  MCV 88.5 87.8  87.6 86.4  PLT 278 260 275 372    Medications:    . antiseptic oral rinse  7 mL Mouth Rinse BID  . calcium acetate  667 mg Oral TID WC  . feeding supplement (PRO-STAT SUGAR FREE 64)  30 mL Oral TID BM  . furosemide  80 mg Intravenous TID AC  . heparin  5,000 Units Subcutaneous 3 times per day  . insulin aspart  0-9 Units Subcutaneous TID WC  . levofloxacin  500 mg Oral Q48H  . sodium bicarbonate  1,300 mg Oral QID  . sodium chloride  3 mL Intravenous Q12H      Otelia Santee, MD November 17, 2015, 8:13 AM

## 2015-11-19 NOTE — Progress Notes (Signed)
   I was paged by the RN saying that the patient was not spontaneously breathing.    On physical exam, I was not able to auscultate heart sounds and pulses were not palpable. We tried obtaining femoral pulse by doppler but they were unable to be obtained.  Patient was made DNR this afternoon. Estimated time of death was 4:40 PM.  Signed Burgess Estelle, MD Pager [phone]

## 2015-11-19 NOTE — Progress Notes (Signed)
Patient stated he was having trouble breathing through his nose and was sort of breath and dizzy. Venti mask was replaced by the nasal canula.

## 2015-11-19 NOTE — Discharge Summary (Signed)
Name: Adrian Khan MRN: [id number] DOB: 06/19/1976 40 y.o. PCP: Adrian Nims, Khan  Date of Admission: 10/23/2015  5:21 PM Date of Discharge: 2015/11/15 Attending Physician: Adrian Khan  Discharge Diagnosis: 1. CKDV, not dialysis candidate, going home with hospice Principal Problem:   Acute renal failure superimposed on stage 4 chronic kidney disease (HCC) Active Problems:   Type II diabetes mellitus with renal manifestations, uncontrolled (HCC)   Obstructive sleep apnea   Anemia in CKD (chronic kidney disease)   Morbid obesity, BMI unknown ([place])   Essential hypertension   Chronic kidney disease (CKD), stage IV (severe) (HCC)   Physical deconditioning   Nephrotic syndrome   Hyperparathyroidism, secondary ([place])   Pressure ulcer   Palliative care encounter  Discharge Medications:   Medication List    STOP taking these medications        amLODipine 10 MG tablet  Commonly known as:  NORVASC     ferrous gluconate 324 MG tablet  Commonly known as:  FERGON     furosemide 80 MG tablet  Commonly known as:  LASIX  Replaced by:  furosemide 10 MG/ML injection     glipiZIDE 5 MG tablet  Commonly known as:  GLUCOTROL     hydrALAZINE 25 MG tablet  Commonly known as:  APRESOLINE     Vitamin D (Ergocalciferol) 50000 units Caps capsule  Commonly known as:  DRISDOL      TAKE these medications        acetaminophen 500 MG tablet  Commonly known as:  TYLENOL  Take 500 mg by mouth every 6 (six) hours as needed for mild pain.     B-D ULTRAFINE III SHORT PEN 31G X 8 MM Misc  Generic drug:  Insulin Pen Needle  USE AS DIRECTED WITH FLEXPEN     calcium acetate 667 MG capsule  Commonly known as:  PHOSLO  TAKE 3 CAPSULES (2,001 MG TOTAL) BY MOUTH 3 (THREE) TIMES DAILY WITH MEALS.     fluticasone 50 MCG/ACT nasal spray  Commonly known as:  FLONASE  Place 1 spray into both nostrils daily.     furosemide 10 MG/ML injection  Commonly known as:  LASIX  Inject 4 mLs (40  mg total) into the muscle once.     LORazepam 0.5 MG tablet  Commonly known as:  ATIVAN  Take 1 tablet (0.5 mg total) by mouth every 8 (eight) hours as needed for anxiety.     nystatin 100000 UNIT/GM Powd  Apply to affected areas on the lower abdomen/groin Khan twice daily. Keep skin dry.     ONE TOUCH ULTRA MINI w/Device Kit  Check blood sugar one time a day     ONE TOUCH ULTRA TEST test strip  Generic drug:  glucose blood  CHECK BLOOD SUGAR ONE TIME A DAY     ONETOUCH DELICA LANCETS FINE Misc  Check blood sugar one time a day     oxyCODONE 20 MG/ML concentrated solution  Commonly known as:  ROXICODONE INTENSOL  Take 0.3 mLs (6 mg total) by mouth every 2 (two) hours as needed (SOB, dyspnea, tachypnea RR > 25).     sodium bicarbonate 650 MG tablet  TAKE 2 TABLETS BY MOUTH 3 TIMES A DAY        Disposition and follow-up:   Adrian Khan was discharged from [facility] in [place] condition.  At the hospital follow up visit please address:  1.  Comfort care- he is given oxycodone concentrated  solution- please refill as needed , also given ativan and oral lasix as needed Metabolic acidosis- unfortunately, not much to offer except bicarb tablets- which we have prescribed. Pt not a dialysis candidate   2.  Labs / imaging needed at time of follow-up:   3.  Pending labs/ test needing follow-up:   Follow-up Appointments:     Follow-up Information    Call [facility].   Specialty:  Hospice Services   Why:  If symptoms worsen   Contact information:   [place] [id number] [phone]       Discharge Instructions: Discharge Instructions    Diet - low sodium heart healthy    Complete by:  As directed      Discharge instructions    Complete by:  As directed   We have set you up with [facility].  Please take the lasix as needed for your shortness of breath Please take the oxycodone as needed     Increase activity  slowly    Complete by:  As directed            Consultations: Treatment Team:  Adrian Area, [facility]  Procedures Performed:  Dg Chest Port 1 View  10/31/2015  CLINICAL DATA:  Shortness of breath. EXAM: PORTABLE CHEST 1 VIEW COMPARISON:  October 03, 2014. FINDINGS: Mild cardiomegaly is noted. No pneumothorax or significant pleural effusion is noted. Mild diffuse airspace opacities are noted which may represent edema, inflammation or possibly overlying soft tissue artifact due to body habitus. Bony thorax is unremarkable. IMPRESSION: Mild diffuse airspace opacities are noted which may represent pneumonia, edema or possibly overlying soft tissue artifact due to body habitus. Electronically Signed   By: Adrian Khan, M.D.   On: 11/05/2015 18:26    2D Echo: ef 55%  Cardiac Cath:   Admission HPI:   Adrian Khan is a 40yo M who is permanently bedridden being ~550 lbs with insulin-dependent Type 2 DM, nephrotic syndrome in setting of CKD Stage 4, hypertension, anxiety/depression, and OSA who presents with progressively worsening cough productive of greenish sputum and shortness of breath over the past few weeks. He says at the beginning of December, he had productive cough, chest congestion, short throat, and rhinorrhea which lasted ~2 weeks and finally went away with the help of Chloracedin. He says he felt well for a week or so, and then the same symptoms came back and have progressively worsened over the past couple of weeks. He also says that his legs have been more swollen and making less urine than usual over the past 2 weeks, he has felt generalized malaise, post-tussive emesis x 1-2 times this week as well as generalized post-tussive myalgias. This morning he felt a bit lightheaded when he was turning to use the bedpan. He denies fevers, chest pain, palpitations, wheezing, abdominal pain, bowel changes, new rashes, or other symptoms currently  Hospital Course by problem  list: Principal Problem:   Acute renal failure superimposed on stage 4 chronic kidney disease (HCC) Active Problems:   Type II diabetes mellitus with renal manifestations, uncontrolled (HCC)   Obstructive sleep apnea   Anemia in CKD (chronic kidney disease)   Morbid obesity, BMI unknown ([place])   Essential hypertension   Chronic kidney disease (CKD), stage IV (severe) (HCC)   Physical deconditioning   Nephrotic syndrome   Hyperparathyroidism, secondary ([place])   Pressure ulcer   Palliative care encounter   Acute on chronic kidney disease w/  metabolic acidosis - Patient has a history of nephrotic syndrome in setting of stage 4 CKD. He came in with worsening SOB, leg swelling, decreased UOP x 2 wks. Labs show Cr 10.7 (baseline ~5), AG 18, HCO3 8, BNP 131. He has not been taking his renal supplements at home. Appears to have gained ~90 lbs in the past year. However, the weight is inaccurate. Likely volume overload from worsening renal failure. UA showing moderate leukocytes. ABG today shows severe metabolic acidosis and low pco2. He is a [place] [facility] patient and was seeing Dr Posey Pronto, and it was decided that he was not a dialysis candidate due to the patient's severe body habitus and being bed bound .  He was given IV lasix during this hospitalization and he was not able to make a lot of urine. He continued to feel short of breath due to his metabolic acidosis. Also, his bicarbonate was increased to 4 times a day as by ABG he was found to have severe metabolic acidosis.  Nephrology was consulted again and it was decided again that he was not a dialysis candidate, either PD or HD. Recommended against a bicarb drip as it would worsen his volume overload. The only option was going to [facility], which the patient refused. Patient indicated that he wanted to go home. Palliative care was consulted and they spoke at length about the different options and home hospice was consulted. He is being sent home with  hospice [facility]). He is also made DNR. He indicated his choices, like he likes fishing- and family will try their best to achieve this goal.    Volume overload: Echo was done which was unremarkable. Lasix 80 mg IV tid was unsuccessfully tried to reduce edema but patient was not able to produce urine.  Presumed Community acquired pneumonia- 4-5 day history of viral URI-like symptoms with initial improvement, followed by worsening course over the past 1-2 weeks. Still has URI like symptoms, afebrile, did present with hypoxia and WBC 21, CXR poor quality due to body habitus, PNA vs edema. Allergic to rocephin. Levaquin was given and was discontinued on the day of discharge.   HTN : blood pressure on the soft side 108/52. Amlodipine was held and discontinued on discharge.  Normocytic anemia? 2/2 CKD. Ferritin normal. Iron mildly low. Hemoglobin was stable at 7  T2DM: - on home glipizide, last a1c was 7.4 in July- had a hypoglycemic episode overnight. A1c normal. Glipizide was discontinued on discharge.   Discharge Vitals:   BP 101/61 mmHg  Pulse 77  Temp(Src) 97.6 F (36.4 C) (Axillary)  Resp 22  Ht _0  (1.803 m)  Wt 565 lb (256.282 kg)  BMI 78.84 kg/m2  SpO2 94%  Discharge Labs:  Results for orders placed or performed during the hospital encounter of 11/12/2015 (from the past 24 hour(s))  Blood gas, arterial     Status: Abnormal   Collection Time: 10/29/15  3:35 PM  Result Value Ref Range   O2 Content 2.0 L/min   Delivery systems NASAL CANNULA    pH, Arterial 7.143 (LL) 7.350 - 7.450   pCO2 arterial 31.9 (L) 35.0 - 45.0 mmHg   pO2, Arterial 57.6 (L) 80.0 - 100.0 mmHg   Bicarbonate 10.5 (L) 20.0 - 24.0 mEq/L   TCO2 11.5 0 - 100 mmol/L   Acid-base deficit 16.7 (H) 0.0 - 2.0 mmol/L   O2 Saturation 83.5 %   Patient temperature 98.6    Collection site RIGHT RADIAL    Drawn by  [id number]    Allens test (pass/fail) PASS PASS  Glucose, capillary     Status: Abnormal   Collection Time:  10/29/15  5:09 PM  Result Value Ref Range   Glucose-Capillary 114 (H) 65 - 99 mg/dL  Glucose, capillary     Status: Abnormal   Collection Time: 10/29/15 10:34 PM  Result Value Ref Range   Glucose-Capillary 106 (H) 65 - 99 mg/dL  CBC     Status: Abnormal   Collection Time: Nov 02, 2015  2:50 AM  Result Value Ref Range   WBC 18.8 (H) 4.0 - 10.5 K/uL   RBC 2.57 (L) 4.22 - 5.81 MIL/uL   Hemoglobin 7.2 (L) 13.0 - 17.0 g/dL   HCT 22.2 (L) 39.0 - 52.0 %   MCV 86.4 78.0 - 100.0 fL   MCH 28.0 26.0 - 34.0 pg   MCHC 32.4 30.0 - 36.0 g/dL   RDW 15.1 11.5 - 15.5 %   Platelets 372 150 - 400 K/uL  Glucose, capillary     Status: None   Collection Time: November 02, 2015  7:58 AM  Result Value Ref Range   Glucose-Capillary 94 65 - 99 mg/dL    Signed: Burgess Estelle, Khan 11-02-15, 1:08 PM    Services Ordered on Discharge:  Equipment Ordered on Discharge:

## 2015-11-19 NOTE — Progress Notes (Signed)
[facility] called  

## 2015-11-19 NOTE — Progress Notes (Signed)
Subjective:  Patient seen and examined at bedside. He says he was continuing to feel short of breath. Both the patient and mother had accepted that home hospice was the best option for him. Mother later indicated question regarding peritoneal dialysis, however Nephrology again said that patient is not a dialysis candidate due to the significant morbidity with PD.  Palliative care and hospice [facility] hosp[facility]) later came by, and services were arranged.  Patient was made DNR.      Objective: Vital signs in last 24 hours: Filed Vitals:   10/29/15 2000 22-Nov-2015 0000 Nov 22, 2015 0023 Nov 22, 2015 0400  BP: 121/73 109/61  101/61  Pulse: 84 84 80 77  Temp: 97.5 F (36.4 C) 97.5 F (36.4 C)  97.6 F (36.4 C)  TempSrc: Oral Axillary  Axillary  Resp: 28 0 0 22  Height:      Weight:      SpO2: 96% 96% 96% 94%   Weight change:   Intake/Output Summary (Last 24 hours) at 11/22/15 1626 Last data filed at 11/22/15 0300  Gross per 24 hour  Intake    360 ml  Output    150 ml  Net    210 ml   General: Vital signs reviewed. morbidly obese patient lying uncomfortably in bed, on 2 L oxygen , mother at bedside Cardiovascular: difficult to appreciate cardiac sounds  Pulmonary/Chest: difficult to auscultate lung sounds Abdominal: Soft, obese,  non-tender, non-distended, BS + Extremities: severe 3+ lower extremity edema extending upto thighs   Lab Results: Results for orders placed or performed during the hospital encounter of 10/26/2015 (from the past 24 hour(s))  Glucose, capillary     Status: Abnormal   Collection Time: 10/29/15  5:09 PM  Result Value Ref Range   Glucose-Capillary 114 (H) 65 - 99 mg/dL  Glucose, capillary     Status: Abnormal   Collection Time: 10/29/15 10:34 PM  Result Value Ref Range   Glucose-Capillary 106 (H) 65 - 99 mg/dL  CBC     Status: Abnormal   Collection Time: 22-Nov-2015  2:50 AM  Result Value Ref Range   WBC 18.8 (H) 4.0 - 10.5 K/uL   RBC 2.57 (L) 4.22 - 5.81  MIL/uL   Hemoglobin 7.2 (L) 13.0 - 17.0 g/dL   HCT 22.2 (L) 39.0 - 52.0 %   MCV 86.4 78.0 - 100.0 fL   MCH 28.0 26.0 - 34.0 pg   MCHC 32.4 30.0 - 36.0 g/dL   RDW 15.1 11.5 - 15.5 %   Platelets 372 150 - 400 K/uL  Glucose, capillary     Status: None   Collection Time: Nov 22, 2015  7:58 AM  Result Value Ref Range   Glucose-Capillary 94 65 - 99 mg/dL  Glucose, capillary     Status: None   Collection Time: 2015/11/22  1:28 PM  Result Value Ref Range   Glucose-Capillary 90 65 - 99 mg/dL    Micro Results: Recent Results (from the past 240 hour(s))  Culture, blood (routine x 2)     Status: None (Preliminary result)   Collection Time: 11/04/2015  5:35 PM  Result Value Ref Range Status   Specimen Description BLOOD RIGHT ARM  Final   Special Requests BOTTLES DRAWN AEROBIC ONLY 10CC  Final   Culture NO GROWTH 3 DAYS  Final   Report Status PENDING  Incomplete  Culture, blood (routine x 2)     Status: None (Preliminary result)   Collection Time: 11/03/2015  5:45 PM  Result Value Ref Range  Status   Specimen Description BLOOD RIGHT HAND  Final   Special Requests BOTTLES DRAWN AEROBIC ONLY 10CC  Final   Culture NO GROWTH 3 DAYS  Final   Report Status PENDING  Incomplete  Gram stain     Status: None   Collection Time: 10/28/15  3:57 AM  Result Value Ref Range Status   Specimen Description URINE, RANDOM  Final   Special Requests NONE  Final   Gram Stain   Final    FEW WBC PRESENT,BOTH PMN AND MONONUCLEAR FEW GRAM NEGATIVE RODS FEW GRAM POSITIVE COCCI IN PAIRS CRITICAL RESULT CALLED TO, READ BACK BY AND VERIFIED WITH: T PHILLIPS @[id number]  10/28/15 MKELLY    Report Status 10/28/2015 FINAL  Final  MRSA PCR Screening     Status: None   Collection Time: 10/28/15  6:11 AM  Result Value Ref Range Status   MRSA by PCR NEGATIVE NEGATIVE Final    Comment:        The GeneXpert MRSA Assay (FDA approved for NASAL specimens only), is one component of a comprehensive MRSA colonization surveillance program.  It is not intended to diagnose MRSA infection nor to guide or monitor treatment for MRSA infections.    Studies/Results: No results found. Medications: I have reviewed the patient's current medications. Scheduled Meds: . antiseptic oral rinse  7 mL Mouth Rinse BID  . calcium acetate  667 mg Oral TID WC  . feeding supplement (PRO-STAT SUGAR FREE 64)  30 mL Oral TID BM  . furosemide  80 mg Intravenous TID AC  . insulin aspart  0-9 Units Subcutaneous TID WC  . levofloxacin  500 mg Oral Q48H  . sodium bicarbonate  1,300 mg Oral QID  . sodium chloride  3 mL Intravenous Q12H   Continuous Infusions:  PRN Meds:.acetaminophen, guaiFENesin, oxyCODONE, sodium chloride Assessment/Plan: Principal Problem:   Acute renal failure superimposed on stage 4 chronic kidney disease (HCC) Active Problems:   Type II diabetes mellitus with renal manifestations, uncontrolled (HCC)   Obstructive sleep apnea   Anemia in CKD (chronic kidney disease)   Morbid obesity, BMI unknown ([place])   Essential hypertension   Chronic kidney disease (CKD), stage IV (severe) (HCC)   Physical deconditioning   Nephrotic syndrome   Hyperparathyroidism, secondary ([place])   Pressure ulcer   Palliative care encounter  Acute on chronic kidney disease w/ metabolic acidosis - h/o nephrotic syndrome in setting of stage 4 CKD, so far not deemed a dialysis candidate. Worsening SOB, leg swelling, decreased UOP x 2 wks. Labs show Cr 10.7 (baseline ~5), AG 18, HCO3 8, BNP 131. He has not been taking his renal supplements at home. Appears to have gained ~90 lbs in the past year. However, the weight is inaccurate.  Likely volume overload  from worsening renal failure vs heart failure?  UA showing moderate leukocytes. ABG today shows severe metabolic acidosis and low pco2.  Nephrology following - After nephrology discussed the case, they feel that pt is still not a dialysis candidate-either HD or PD. Patient was not able to make urine despite  aggressive diuresis of lasix. Palliative care was consulted, and family decided on home with hospice. He is being sent home on ativan, oral lasix, and oxycodone solution PRN- further orders for comfort care will be managed by hospice MD.  HTN : blood pressure on the soft side 108/52 -HOLD home amlodipine  Disposition: see #1- pt going home with hospice.  Disposition: palliative care consulted and patient has chosen hospitce- home vs  inpatient- im not sure how home hospice will be for him given his weight and symptom management will be difficult at home.   Dispo: Disposition is deferred at this time, awaiting improvement of current medical problems.  Anticipated discharge in approximately 2 day(s).   The patient does have a current PCP (Tasrif Ahmed, MD) and does need an [facility] hospital follow-up appointment after discharge.  The patient does not have transportation limitations that hinder transportation to clinic appointments.  .Services Needed at time of discharge: Y = Yes, Blank = No PT:   OT:   RN:   Equipment:   Other:     LOS: 3 days   Burgess Estelle, MD 2015/11/10, 4:26 PM

## 2015-11-19 NOTE — Progress Notes (Signed)
Physical Therapy Discharge Patient Details Name: Adrian Khan MRN: [id number] DOB: 1976/01/18 Today's Date: Nov 02, 2015 Time:  -     Patient discharged from PT services secondary to medical decline - will need to re-order PT to resume therapy services and  pt going home with Hospice care.  .  Please see latest therapy progress note for current level of functioning and progress toward goals.    Progress and discharge plan discussed with patient and/or caregiver: Patient/Caregiver agrees with plan  GP     Irwin Brakeman F 11-02-15, 11:16 AM [facility] [phone] (pager)

## 2015-11-19 DEATH — deceased

## 2015-11-24 ENCOUNTER — Encounter: Payer: Medicare Other | Admitting: Internal Medicine

## 2016-05-26 IMAGING — CR DG CHEST 1V PORT
2 series · 2 of 2 positions shown · non-contrast
Comparison: October 03, 2014.

CLINICAL DATA: Shortness of breath.

EXAM:
PORTABLE CHEST 1 VIEW

[AP (1 of 2)]
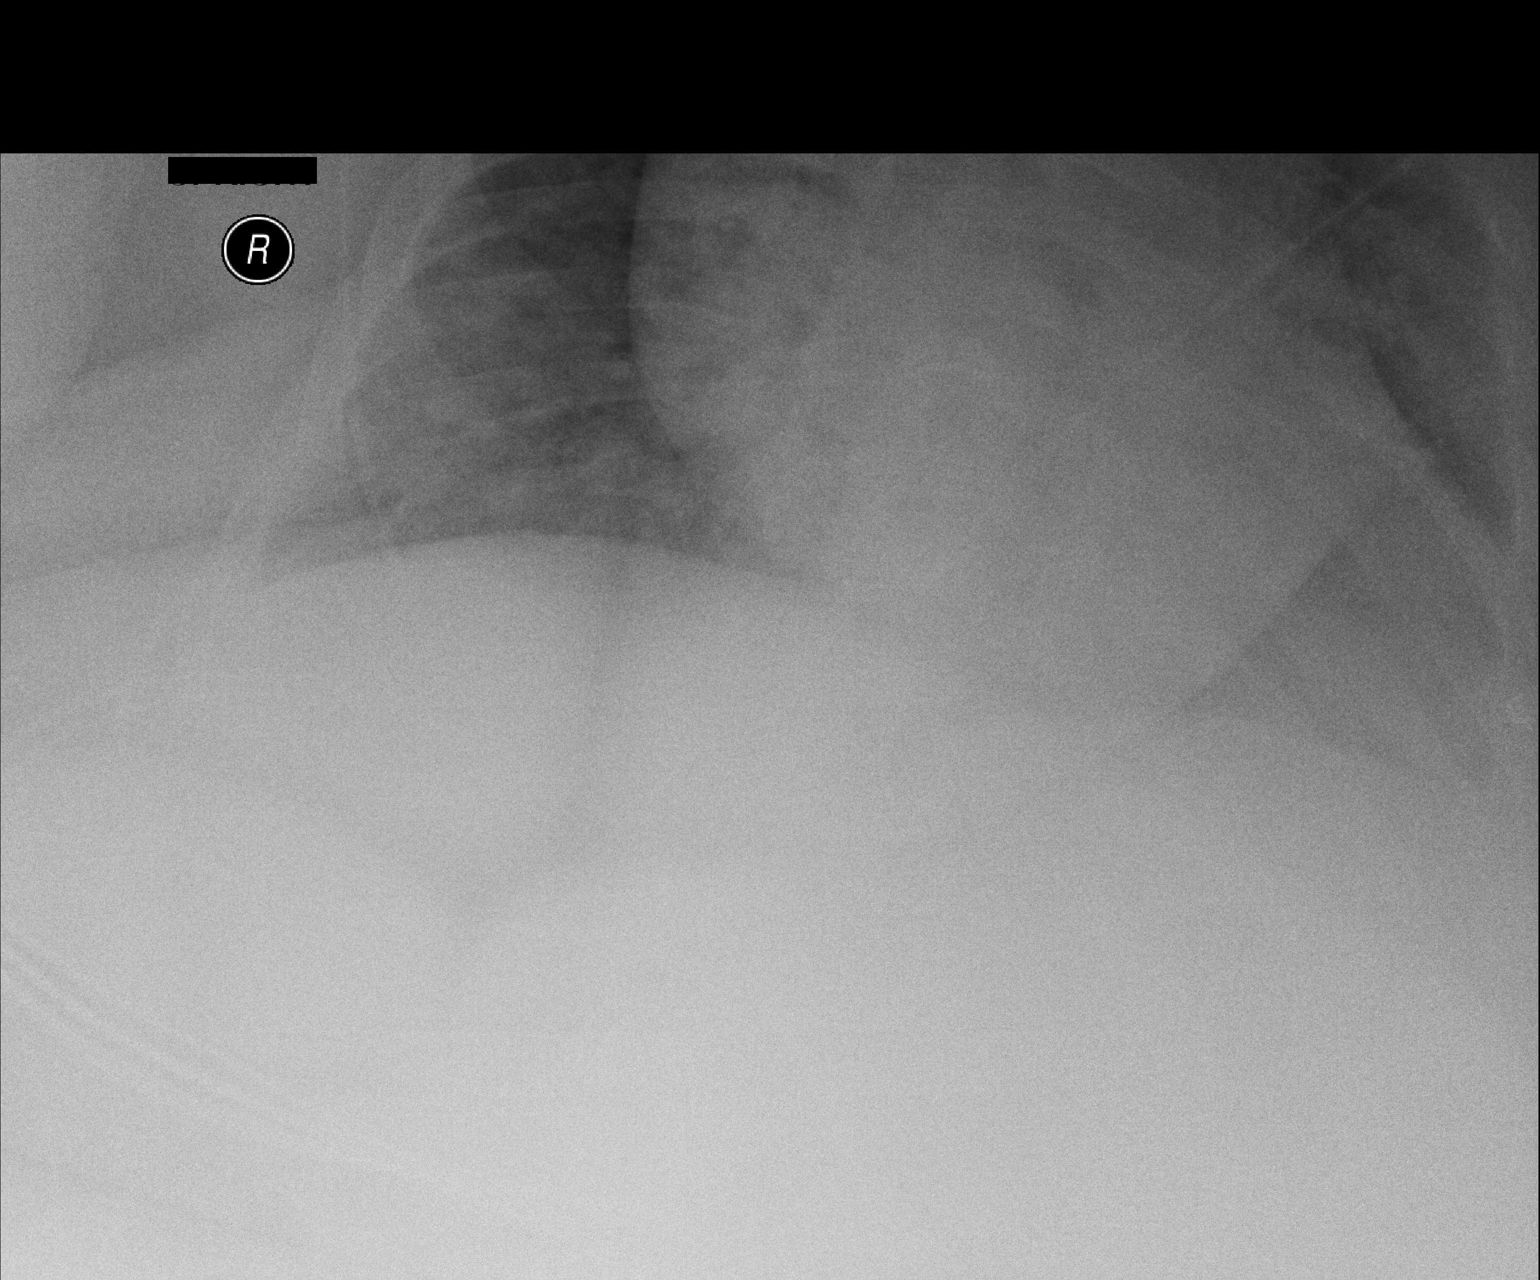

[AP (2 of 2)]
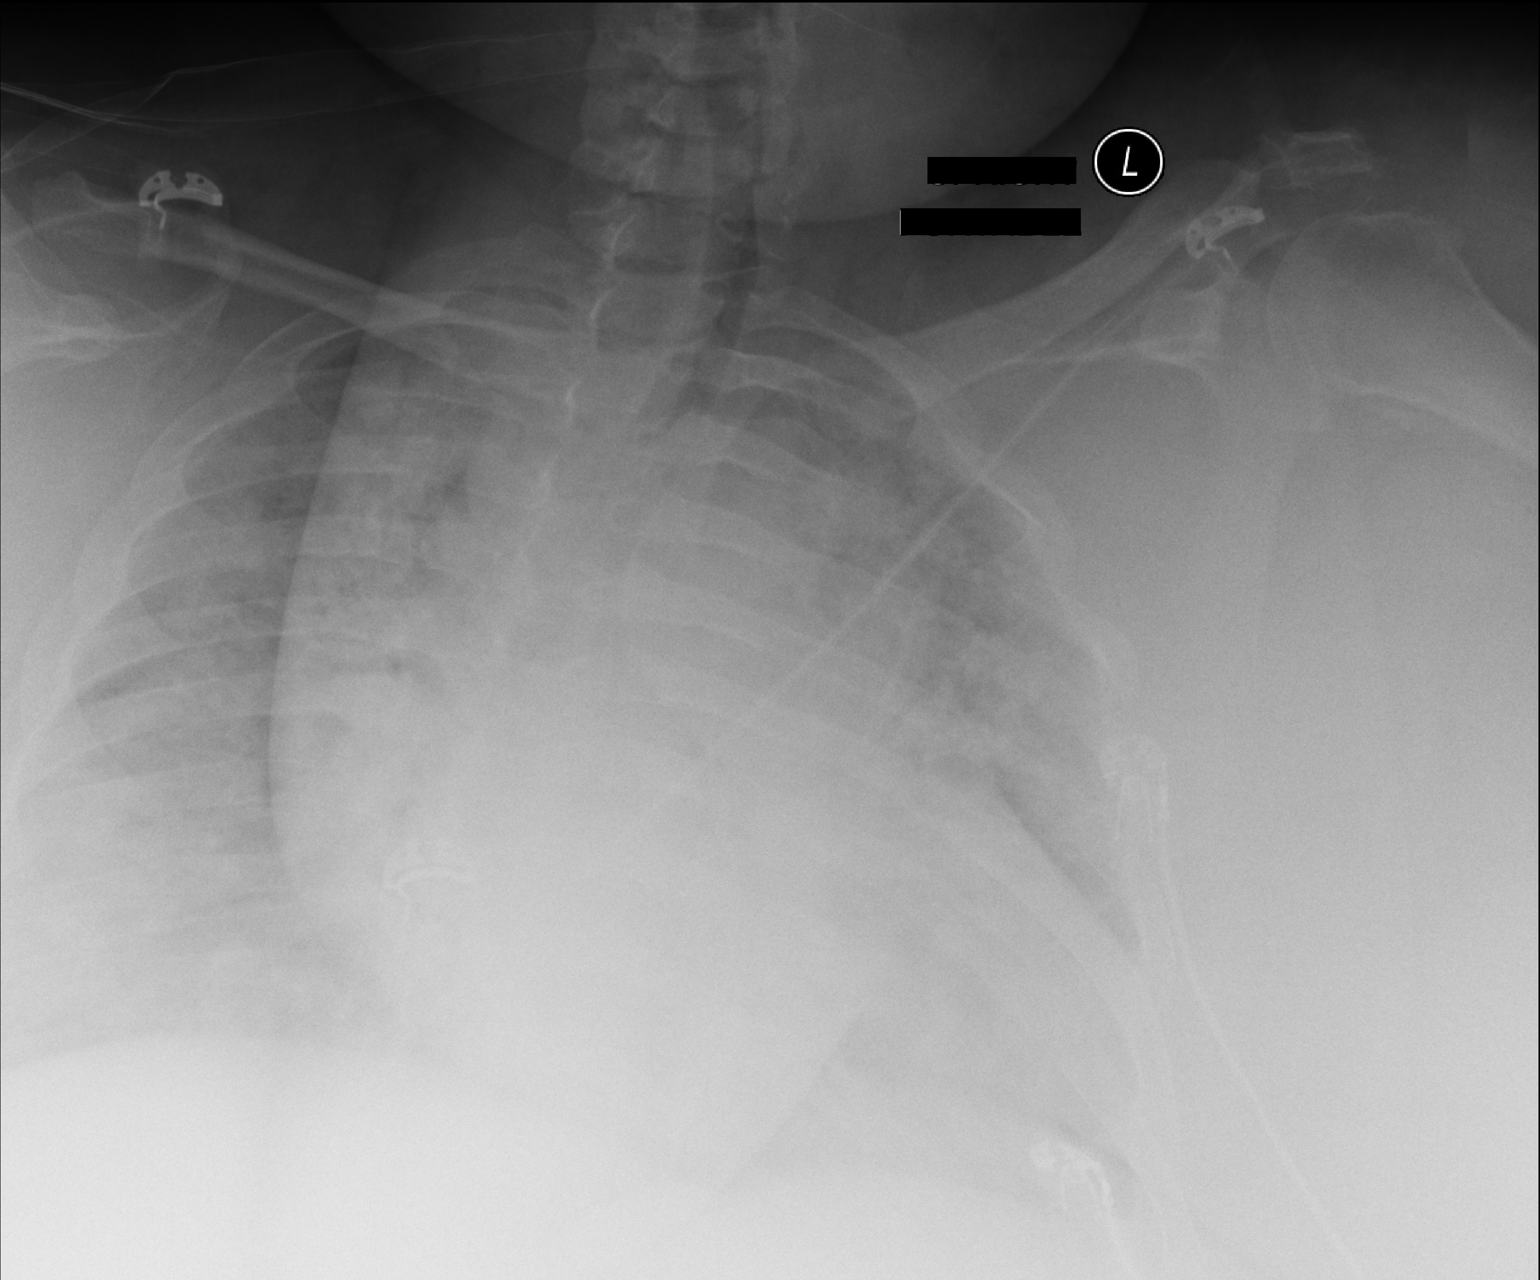

[2 of 2 positions shown; findings below may reference images not displayed]

FINDINGS: Mild cardiomegaly is noted. No pneumothorax or significant pleural
effusion is noted. Mild diffuse airspace opacities are noted which
may represent edema, inflammation or possibly overlying soft tissue
artifact due to body habitus. Bony thorax is unremarkable.
IMPRESSION: Mild diffuse airspace opacities are noted which may represent
pneumonia, edema or possibly overlying soft tissue artifact due to
body habitus.
